# Patient Record
Sex: Male | Born: 1940
Health system: Southern US, Community
[De-identification: ages and names within clinical notes are randomized; demographics above are authoritative.]

## PROBLEM LIST (undated history)

## (undated) DIAGNOSIS — C240 Malignant neoplasm of extrahepatic bile duct: Secondary | ICD-10-CM

## (undated) DIAGNOSIS — I10 Essential (primary) hypertension: Secondary | ICD-10-CM

## (undated) DIAGNOSIS — M1990 Unspecified osteoarthritis, unspecified site: Secondary | ICD-10-CM

## (undated) DIAGNOSIS — I509 Heart failure, unspecified: Secondary | ICD-10-CM

## (undated) DIAGNOSIS — F419 Anxiety disorder, unspecified: Secondary | ICD-10-CM

## (undated) HISTORY — PX: MOHS SURGERY: SUR867

## (undated) HISTORY — PX: BILE DUCT EXPLORATION: SHX1225

## (undated) HISTORY — DX: Malignant neoplasm of extrahepatic bile duct: C24.0

## (undated) MED FILL — Dexamethasone Sodium Phosphate Inj 100 MG/10ML: INTRAMUSCULAR | Qty: 1 | Status: AC

## (undated) MED FILL — Sodium Chloride IV Soln 0.9%: INTRAVENOUS | Qty: 250 | Status: AC

## (undated) MED FILL — Fosaprepitant Dimeglumine For IV Infusion 150 MG (Base Eq): INTRAVENOUS | Qty: 5 | Status: AC

---

## 2017-12-11 DIAGNOSIS — K769 Liver disease, unspecified: Secondary | ICD-10-CM

## 2017-12-17 DIAGNOSIS — C229 Malignant neoplasm of liver, not specified as primary or secondary: Secondary | ICD-10-CM

## 2017-12-27 DIAGNOSIS — D490 Neoplasm of unspecified behavior of digestive system: Secondary | ICD-10-CM

## 2017-12-27 HISTORY — DX: Neoplasm of unspecified behavior of digestive system: D49.0

## 2017-12-29 DIAGNOSIS — C221 Intrahepatic bile duct carcinoma: Secondary | ICD-10-CM | POA: Insufficient documentation

## 2017-12-29 HISTORY — DX: Intrahepatic bile duct carcinoma: C22.1

## 2018-01-08 DIAGNOSIS — N390 Urinary tract infection, site not specified: Secondary | ICD-10-CM

## 2018-01-08 HISTORY — DX: Urinary tract infection, site not specified: N39.0

## 2018-02-14 DIAGNOSIS — C229 Malignant neoplasm of liver, not specified as primary or secondary: Secondary | ICD-10-CM

## 2018-02-14 DIAGNOSIS — R978 Other abnormal tumor markers: Secondary | ICD-10-CM

## 2018-02-19 DIAGNOSIS — Z79899 Other long term (current) drug therapy: Secondary | ICD-10-CM

## 2018-02-19 DIAGNOSIS — C229 Malignant neoplasm of liver, not specified as primary or secondary: Secondary | ICD-10-CM

## 2018-03-04 DIAGNOSIS — Z79899 Other long term (current) drug therapy: Secondary | ICD-10-CM

## 2018-03-04 DIAGNOSIS — C229 Malignant neoplasm of liver, not specified as primary or secondary: Secondary | ICD-10-CM

## 2018-03-04 DIAGNOSIS — R110 Nausea: Secondary | ICD-10-CM

## 2018-03-04 DIAGNOSIS — R197 Diarrhea, unspecified: Secondary | ICD-10-CM

## 2018-03-13 DIAGNOSIS — Z79899 Other long term (current) drug therapy: Secondary | ICD-10-CM

## 2018-03-13 DIAGNOSIS — C229 Malignant neoplasm of liver, not specified as primary or secondary: Secondary | ICD-10-CM

## 2018-04-17 DIAGNOSIS — C229 Malignant neoplasm of liver, not specified as primary or secondary: Secondary | ICD-10-CM

## 2018-04-17 DIAGNOSIS — R531 Weakness: Secondary | ICD-10-CM

## 2018-04-17 DIAGNOSIS — Z79899 Other long term (current) drug therapy: Secondary | ICD-10-CM

## 2018-05-08 DIAGNOSIS — C229 Malignant neoplasm of liver, not specified as primary or secondary: Secondary | ICD-10-CM

## 2018-05-29 DIAGNOSIS — C229 Malignant neoplasm of liver, not specified as primary or secondary: Secondary | ICD-10-CM

## 2018-06-24 DIAGNOSIS — C229 Malignant neoplasm of liver, not specified as primary or secondary: Secondary | ICD-10-CM

## 2018-06-24 DIAGNOSIS — Z79899 Other long term (current) drug therapy: Secondary | ICD-10-CM

## 2018-07-31 DIAGNOSIS — R238 Other skin changes: Secondary | ICD-10-CM

## 2018-07-31 DIAGNOSIS — Z79899 Other long term (current) drug therapy: Secondary | ICD-10-CM

## 2018-07-31 DIAGNOSIS — C229 Malignant neoplasm of liver, not specified as primary or secondary: Secondary | ICD-10-CM

## 2018-10-13 DIAGNOSIS — C229 Malignant neoplasm of liver, not specified as primary or secondary: Secondary | ICD-10-CM

## 2019-04-16 DIAGNOSIS — C787 Secondary malignant neoplasm of liver and intrahepatic bile duct: Secondary | ICD-10-CM

## 2019-04-16 DIAGNOSIS — C221 Intrahepatic bile duct carcinoma: Secondary | ICD-10-CM

## 2019-04-24 ENCOUNTER — Encounter: Payer: Self-pay | Admitting: Cardiology

## 2019-04-24 ENCOUNTER — Ambulatory Visit (INDEPENDENT_AMBULATORY_CARE_PROVIDER_SITE_OTHER): Payer: Medicare Other | Admitting: Cardiology

## 2019-04-24 ENCOUNTER — Other Ambulatory Visit: Payer: Self-pay

## 2019-04-24 DIAGNOSIS — R0600 Dyspnea, unspecified: Secondary | ICD-10-CM | POA: Insufficient documentation

## 2019-04-24 DIAGNOSIS — I420 Dilated cardiomyopathy: Secondary | ICD-10-CM

## 2019-04-24 DIAGNOSIS — R0609 Other forms of dyspnea: Secondary | ICD-10-CM

## 2019-04-24 DIAGNOSIS — I5030 Unspecified diastolic (congestive) heart failure: Secondary | ICD-10-CM | POA: Insufficient documentation

## 2019-04-24 DIAGNOSIS — I10 Essential (primary) hypertension: Secondary | ICD-10-CM

## 2019-04-24 DIAGNOSIS — I5032 Chronic diastolic (congestive) heart failure: Secondary | ICD-10-CM

## 2019-04-24 DIAGNOSIS — I429 Cardiomyopathy, unspecified: Secondary | ICD-10-CM | POA: Insufficient documentation

## 2019-04-24 DIAGNOSIS — M1990 Unspecified osteoarthritis, unspecified site: Secondary | ICD-10-CM | POA: Insufficient documentation

## 2019-04-24 HISTORY — DX: Unspecified diastolic (congestive) heart failure: I50.30

## 2019-04-24 HISTORY — DX: Essential (primary) hypertension: I10

## 2019-04-24 HISTORY — DX: Other forms of dyspnea: R06.09

## 2019-04-24 HISTORY — DX: Cardiomyopathy, unspecified: I42.9

## 2019-04-24 HISTORY — DX: Dyspnea, unspecified: R06.00

## 2019-04-24 NOTE — Addendum Note (Signed)
Addended by: Ashok Norris on: 04/24/2019 10:43 AM   Modules accepted: Orders

## 2019-04-24 NOTE — Addendum Note (Signed)
Addended by: Jerl Santos R on: 04/24/2019 05:13 PM   Modules accepted: Orders

## 2019-04-24 NOTE — Progress Notes (Signed)
Cardiology Consultation:    Date:  04/24/2019   ID:  Gary [name], DOB 01-07-1941, MRN [id number]  PCP:  Gary Shelling, MD  Cardiologist:  Gary Campus, MD   Referring MD: Gary Shelling, MD   Chief Complaint  Patient presents with  . Shortness of Breath    History of Present Illness:    Gary [name] is a 78 y.o. male who is being seen today for the evaluation of shortness of breath at the request of Gary Shelling, MD.  Apparently he was seen in our practice years ago.  He was told to have cardiomyopathy.  Last estimation of his left ventricular ejection fraction was done in April of last year which showed ejection fraction 45 to 50% however transmitral flow pattern was pseudo-normal and there was diastolic dysfunction.  He came back to [place] complaining of shortness of breath he complained of being weak tired fatigue as well as shortness of breath.  Describe also the fact that he have difficulty breathing at night sometimes he need to sit up to catch his breath.  Denies having any palpitations.  He cannot tell me if he have some swelling of lower extremities.  Last week by himself he started taking some furosemide and he started feeling better he stopped taking this medication and he does not take Lasix right now.  At the moment of my interview he seems to be comfortable.  He sits in the room with his wife.  He said he got difficult time memorizing stuff that is why she is with [place].  He denies having any chest pain tightness squeezing pressure been chest but describes symptoms to have choking sensation in his throat he said he got difficulty swallowing when it happens.  It does not happen with exercise.  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  He also got carotic ultrasound done recently and was told everything is fine I do not have results of that.  No past medical history on file.     Current Medications: Current Meds  Medication Sig  . ALPRAZolam (XANAX) 0.5 MG tablet Take 0.5 mg by mouth 3 (three) times daily as needed.  Marland Kitchen buPROPion (WELLBUTRIN XL) 150 MG 24 hr tablet Take 150 mg by mouth 2 (two) times daily.  . carvedilol (COREG) 3.125 MG tablet Take 3.125 mg by mouth 2 (two) times daily.  . clotrimazole-betamethasone (LOTRISONE) cream APPLY 1 APPLICATION TO AFFECTED AREA AS DIRECTED  . hydrochlorothiazide (HYDRODIURIL) 25 MG tablet Take 25 mg by mouth every morning.  Marland Kitchen HYDROcodone-acetaminophen (NORCO) 10-325 MG tablet Take 1 tablet by mouth 3 (three) times daily as needed.  . loratadine (CLARITIN REDITABS) 10 MG dissolvable tablet Take 10 mg by mouth daily.  . Multiple Vitamin (MULTIVITAMIN) tablet Take 1 tablet by mouth daily.  . Omega-3 Fatty Acids (FISH OIL) 1000 MG CAPS Take by mouth.  Marland Kitchen omeprazole (PRILOSEC) 40 MG capsule Take 1 capsule by mouth daily.  . ondansetron (ZOFRAN) 4 MG tablet Take 1 tablet by mouth 2 (two) times daily as needed.  . tamsulosin (FLOMAX) 0.4 MG CAPS capsule TAKE 1 (ONE) CAPSULE DAILY, 1/2 HR AFTER SUPPER  . vitamin B-12 (CYANOCOBALAMIN) 250 MCG tablet Take 250 mcg by mouth daily.     Allergies:   Pork-derived products and Amoxicillin   Social History   Socioeconomic History  . Marital status: Widowed    Spouse name:  Not on file  . Number of children: Not on file  . Years of education: Not on file  . Highest education level: Not on file  Occupational History  . Not on file  Social Needs  . Financial resource strain: Not on file  . Food insecurity    Worry: Not on file    Inability: Not on file  . Transportation needs    Medical: Not on file    Non-medical: Not on file  Tobacco Use  . Smoking status: Never Smoker  . Smokeless tobacco: Current User    Types: Chew  Substance and Sexual Activity  . Alcohol use: Not Currently  . Drug use: Not on file  . Sexual activity: Not on file  Lifestyle  . Physical activity     Days per week: Not on file    Minutes per session: Not on file  . Stress: Not on file  Relationships  . Social Herbalist on phone: Not on file    Gets together: Not on file    Attends religious service: Not on file    Active member of club or organization: Not on file    Attends meetings of clubs or organizations: Not on file    Relationship status: Not on file  Other Topics Concern  . Not on file  Social History Narrative  . Not on file     Family History: The patient's family history is not on file. ROS:   Please see the history of present illness.    All 14 point review of systems negative except as described per history of present illness.  EKGs/Labs/Other Studies Reviewed:    The following studies were reviewed today:   EKG:  EKG is  ordered today.  The ekg ordered today demonstrates normal sinus rhythm nonspecific ST-T segment changes normal QS complex duration morphology.  Recent Labs: No results found for requested labs within last 8760 hours.  Recent Lipid Panel No results found for: CHOL, TRIG, HDL, CHOLHDL, VLDL, LDLCALC, LDLDIRECT  Physical Exam:    VS:  BP 116/68   Pulse 61   Ht 6\' 2"  (1.88 m)   Wt 292 lb 3.2 oz (132.5 kg)   SpO2 97%   BMI 37.52 kg/m     Wt Readings from Last 3 Encounters:  04/24/19 292 lb 3.2 oz (132.5 kg)     GEN:  Well nourished, well developed in no acute distress HEENT: Normal NECK: No JVD; No carotid bruits LYMPHATICS: No lymphadenopathy CARDIAC: RRR, no murmurs, no rubs, no gallops RESPIRATORY:  Clear to auscultation without rales, wheezing or rhonchi  ABDOMEN: Soft, non-tender, non-distended MUSCULOSKELETAL:  No edema; No deformity  SKIN: Warm and dry NEUROLOGIC:  Alert and oriented x 3 PSYCHIATRIC:  Normal affect   ASSESSMENT:    1. 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 Dyspnea on exertion   3. Chronic diastolic congestive heart failure ([place])   4. Essential  hypertension    PLAN:    In order of problems listed above:  1. History of dilated cardiomyopathy with last estimation of ejection fraction last year 45 to 50%.  We will repeat his echocardiogram.  I will ask him to have Chem-7 done and proBNP.  It will be especially important since he took furosemide about himself without on his potassium supplementation.  I asked him not to modify his medication right now he is stop already furosemide obviously will when we  get results of his test back will advise him about medications I anticipate need to put him on some standing dose of furosemide. 2. Dyspnea on exertion probably multifactorial but with this gentleman history of cardiomyopathy we need to assess his left ventricular ejection fraction which we will do with echocardiogram. 3. Chronic diastolic congestive heart failure plan as outlined above 4. Essential hypertension blood pressure appears to be well controlled.   Medication Adjustments/Labs and Tests Ordered: Current medicines are reviewed at length with the patient today.  Concerns regarding medicines are outlined above.  No orders of the defined types were placed in this encounter.  No orders of the defined types were placed in this encounter.   Signed, Park Liter, MD, [facility]. 04/24/2019 10:34 AM    [place]

## 2019-04-24 NOTE — Patient Instructions (Signed)
Medication Instructions:  Your physician recommends that you continue on your current medications as directed. Please refer to the Current Medication list given to you today.  If you need a refill on your cardiac medications before your next appointment, please call your pharmacy.   Lab work: Your physician recommends that you return for lab work in: bmp and pro bnp   If you have labs (blood work) drawn today and your tests are completely normal, you will receive your results only by: Marland Kitchen MyChart Message (if you have MyChart) OR . A paper copy in the mail If you have any lab test that is abnormal or we need to change your treatment, we will call you to review the results.  Testing/Procedures: Your physician has requested that you have an echocardiogram. Echocardiography is a painless test that uses sound waves to create images of your heart. It provides your doctor with information about the size and shape of your heart and how well your heart's chambers and valves are working. This procedure takes approximately one hour. There are no restrictions for this procedure.    Follow-Up: At [facility], you and your health needs are our priority.  As part of our continuing mission to provide you with exceptional heart care, we have created designated Provider Care Teams.  These Care Teams include your primary Cardiologist (physician) and Advanced Practice Providers (APPs -  Physician Assistants and Nurse Practitioners) who all work together to provide you with the care you need, when you need it. You will need a follow up appointment in 4 weeks.  Please call our office 2 months in advance to schedule this appointment.  You may see No primary care provider on file. or another member of our [employer] Provider Team in [place]: Shirlee More, MD . Jyl Heinz, MD  Any Other Special Instructions Will Be Listed Below (If Applicable).   Echocardiogram An echocardiogram is a procedure that uses  painless sound waves (ultrasound) to produce an image of the heart. Images from an echocardiogram can provide important information about:  Signs of coronary artery disease (CAD).  Aneurysm detection. An aneurysm is a weak or damaged part of an artery wall that bulges out from the normal force of blood pumping through the body.  Heart size and shape. Changes in the size or shape of the heart can be associated with certain conditions, including heart failure, aneurysm, and CAD.  Heart muscle function.  Heart valve function.  Signs of a past heart attack.  Fluid buildup around the heart.  Thickening of the heart muscle.  A tumor or infectious growth around the heart valves. Tell a health care provider about:  Any allergies you have.  All medicines you are taking, including vitamins, herbs, eye drops, creams, and over-the-counter medicines.  Any blood disorders you have.  Any surgeries you have had.  Any medical conditions you have.  Whether you are pregnant or may be pregnant. What are the risks? Generally, this is a safe procedure. However, problems may occur, including:  Allergic reaction to dye (contrast) that may be used during the procedure. What happens before the procedure? No specific preparation is needed. You may eat and drink normally. What happens during the procedure?   An IV tube may be inserted into one of your veins.  You may receive contrast through this tube. A contrast is an injection that improves the quality of the pictures from your heart.  A gel will be applied to your chest.  A  wand-like tool (transducer) will be moved over your chest. The gel will help to transmit the sound waves from the transducer.  The sound waves will harmlessly bounce off of your heart to allow the heart images to be captured in real-time motion. The images will be recorded on a computer. The procedure may vary among health care providers and hospitals. What happens after  the procedure?  You may return to your normal, everyday life, including diet, activities, and medicines, unless your health care provider tells you not to do that. Summary  An echocardiogram is a procedure that uses painless sound waves (ultrasound) to produce an image of the heart.  Images from an echocardiogram can provide important information about the size and shape of your heart, heart muscle function, heart valve function, and fluid buildup around your heart.  You do not need to do anything to prepare before this procedure. You may eat and drink normally.  After the echocardiogram is completed, you may return to your normal, everyday life, unless your health care provider tells you not to do that. This information is not intended to replace advice given to you by your health care provider. Make sure you discuss any questions you have with your health care provider. Document Released: 09/07/2000 Document Revised: 01/01/2019 Document Reviewed: 10/13/2016 Elsevier Patient Education  2020 [employer].

## 2019-04-25 LAB — BASIC METABOLIC PANEL
BUN/Creatinine Ratio: 20 (ref 10–24)
BUN: 23 mg/dL (ref 8–27)
CO2: 24 mmol/L (ref 20–29)
Calcium: 9.2 mg/dL (ref 8.6–10.2)
Chloride: 100 mmol/L (ref 96–106)
Creatinine, Ser: 1.15 mg/dL (ref 0.76–1.27)
GFR calc Af Amer: 71 mL/min/{1.73_m2} (ref >59)
GFR calc non Af Amer: 61 mL/min/{1.73_m2} (ref >59)
Glucose: 152 mg/dL — ABNORMAL HIGH (ref 65–99)
Potassium: 4 mmol/L (ref 3.5–5.2)
Sodium: 140 mmol/L (ref 134–144)

## 2019-04-25 LAB — PRO B NATRIURETIC PEPTIDE: NT-Pro BNP: 165 pg/mL (ref 0–486)

## 2019-05-04 ENCOUNTER — Ambulatory Visit: Payer: Medicare Other | Admitting: Cardiology

## 2019-05-06 ENCOUNTER — Other Ambulatory Visit: Payer: Medicare Other

## 2019-05-27 ENCOUNTER — Ambulatory Visit (INDEPENDENT_AMBULATORY_CARE_PROVIDER_SITE_OTHER): Payer: Medicare Other

## 2019-05-27 ENCOUNTER — Other Ambulatory Visit: Payer: Self-pay

## 2019-05-27 DIAGNOSIS — I420 Dilated cardiomyopathy: Secondary | ICD-10-CM | POA: Diagnosis not present

## 2019-05-27 DIAGNOSIS — R0609 Other forms of dyspnea: Secondary | ICD-10-CM | POA: Diagnosis not present

## 2019-05-27 NOTE — Progress Notes (Signed)
Complete echocardiogram has been performed.  Jimmy Dharma Pare RDCS, RVT 

## 2019-06-02 ENCOUNTER — Other Ambulatory Visit: Payer: Self-pay

## 2019-06-02 ENCOUNTER — Encounter: Payer: Self-pay | Admitting: Cardiology

## 2019-06-02 ENCOUNTER — Ambulatory Visit (INDEPENDENT_AMBULATORY_CARE_PROVIDER_SITE_OTHER): Payer: Medicare Other | Admitting: Cardiology

## 2019-06-02 VITALS — BP 126/72 | HR 63 | Wt 298.0 lb

## 2019-06-02 DIAGNOSIS — I420 Dilated cardiomyopathy: Secondary | ICD-10-CM | POA: Diagnosis not present

## 2019-06-02 DIAGNOSIS — I5032 Chronic diastolic (congestive) heart failure: Secondary | ICD-10-CM

## 2019-06-02 DIAGNOSIS — I10 Essential (primary) hypertension: Secondary | ICD-10-CM | POA: Diagnosis not present

## 2019-06-02 DIAGNOSIS — R0609 Other forms of dyspnea: Secondary | ICD-10-CM | POA: Diagnosis not present

## 2019-06-02 DIAGNOSIS — R0600 Dyspnea, unspecified: Secondary | ICD-10-CM

## 2019-06-02 NOTE — Patient Instructions (Signed)
Medication Instructions:  Your physician recommends that you continue on your current medications as directed. Please refer to the Current Medication list given to you today.  If you need a refill on your cardiac medications before your next appointment, please call your pharmacy.   Lab work: None.  If you have labs (blood work) drawn today and your tests are completely normal, you will receive your results only by: . MyChart Message (if you have MyChart) OR . A paper copy in the mail If you have any lab test that is abnormal or we need to change your treatment, we will call you to review the results.  Testing/Procedures: Your physician has requested that you have a lexiscan myoview. For further information please visit [email or web address]. Please follow instruction sheet, as given.    Follow-Up: At [facility], you and your health needs are our priority.  As part of our continuing mission to provide you with exceptional heart care, we have created designated Provider Care Teams.  These Care Teams include your primary Cardiologist (physician) and Advanced Practice Providers (APPs -  Physician Assistants and Nurse Practitioners) who all work together to provide you with the care you need, when you need it. You will need a follow up appointment in 3 months.  Please call our office 2 months in advance to schedule this appointment.  You may see No primary care provider on file. or another member of our [facility] Provider Team in [place]: Brian Munley, MD . Rajan Revankar, MD  Any Other Special Instructions Will Be Listed Below (If Applicable).   Cardiac Nuclear Scan A cardiac nuclear scan is a test that measures blood flow to the heart when a person is resting and when he or she is exercising. The test looks for problems such as:  Not enough blood reaching a portion of the heart.  The heart muscle not working normally. You may need this test if:  You have heart disease.  You  have had abnormal lab results.  You have had heart surgery or a balloon procedure to open up blocked arteries (angioplasty).  You have chest pain.  You have shortness of breath. In this test, a radioactive dye (tracer) is injected into your bloodstream. After the tracer has traveled to your heart, an imaging device is used to measure how much of the tracer is absorbed by or distributed to various areas of your heart. This procedure is usually done at a hospital and takes 2-4 hours. Tell a health care provider about:  Any allergies you have.  All medicines you are taking, including vitamins, herbs, eye drops, creams, and over-the-counter medicines.  Any problems you or family members have had with anesthetic medicines.  Any blood disorders you have.  Any surgeries you have had.  Any medical conditions you have.  Whether you are pregnant or may be pregnant. What are the risks? Generally, this is a safe procedure. However, problems may occur, including:  Serious chest pain and heart attack. This is only a risk if the stress portion of the test is done.  Rapid heartbeat.  Sensation of warmth in your chest. This usually passes quickly.  Allergic reaction to the tracer. What happens before the procedure?  Ask your health care provider about changing or stopping your regular medicines. This is especially important if you are taking diabetes medicines or blood thinners.  Follow instructions from your health care provider about eating or drinking restrictions.  Remove your jewelry on the day of the procedure.   What happens during the procedure?  An IV will be inserted into one of your veins.  Your health care provider will inject a small amount of radioactive tracer through the IV.  You will wait for 20-40 minutes while the tracer travels through your bloodstream.  Your heart activity will be monitored with an electrocardiogram (ECG).  You will lie down on an exam table.   Images of your heart will be taken for about 15-20 minutes.  You may also have a stress test. For this test, one of the following may be done: ? You will exercise on a treadmill or stationary bike. While you exercise, your heart's activity will be monitored with an ECG, and your blood pressure will be checked. ? You will be given medicines that will increase blood flow to parts of your heart. This is done if you are unable to exercise.  When blood flow to your heart has peaked, a tracer will again be injected through the IV.  After 20-40 minutes, you will get back on the exam table and have more images taken of your heart.  Depending on the type of tracer used, scans may need to be repeated 3-4 hours later.  Your IV line will be removed when the procedure is over. The procedure may vary among health care providers and hospitals. What happens after the procedure?  Unless your health care provider tells you otherwise, you may return to your normal schedule, including diet, activities, and medicines.  Unless your health care provider tells you otherwise, you may increase your fluid intake. This will help to flush the contrast dye from your body. Drink enough fluid to keep your urine pale yellow.  Ask your health care provider, or the department that is doing the test: ? When will my results be ready? ? How will I get my results? Summary  A cardiac nuclear scan measures the blood flow to the heart when a person is resting and when he or she is exercising.  Tell your health care provider if you are pregnant.  Before the procedure, ask your health care provider about changing or stopping your regular medicines. This is especially important if you are taking diabetes medicines or blood thinners.  After the procedure, unless your health care provider tells you otherwise, increase your fluid intake. This will help flush the contrast dye from your body.  After the procedure, unless your health  care provider tells you otherwise, you may return to your normal schedule, including diet, activities, and medicines. This information is not intended to replace advice given to you by your health care provider. Make sure you discuss any questions you have with your health care provider. Document Released: 10/05/2004 Document Revised: 02/24/2018 Document Reviewed: 02/24/2018 Elsevier Patient Education  2020 Elsevier Inc.    

## 2019-06-02 NOTE — Progress Notes (Signed)
Cardiology Office Note:    Date:  06/02/2019   ID:  Gary [name], DOB Oct 16, 1940, MRN [id number]  PCP:  Bonnita Nasuti, MD  Cardiologist:  Jenne Campus, MD    Referring MD: Bonnita Nasuti, MD   No chief complaint on file.   History of Present Illness:    Gary [name] is a 78 y.o. male who was referred to [place] because of shortness of breath.  He does have history of cardiomyopathy however latest echocardiogram done showed ejection fraction 50 to 55%.  He does have some diastolic dysfunction.  Today he comes to my office for follow-up he still complain of not feeling well on my questioning exactly what is the problem he tells me he is tired exhausted and short of breath.  When asked when was the last time that he felt good he cannot recall.  He does not do much he try to walk a little bit but to get tired very easily.  Denies having any chest pain tightness squeezing pressure burning chest while walking.  No past medical history on file.    Current Medications: No outpatient medications have been marked as taking for the 06/02/19 encounter (Office Visit) with Park Liter, MD.     Allergies:   Pork-derived products and Amoxicillin   Social History   Socioeconomic History  . Marital status: Widowed    Spouse name: Not on file  . Number of children: Not on file  . Years of education: Not on file  . Highest education level: Not on file  Occupational History  . Not on file  Social Needs  . Financial resource strain: Not on file  . Food insecurity    Worry: Not on file    Inability: Not on file  . Transportation needs    Medical: Not on file    Non-medical: Not on file  Tobacco Use  . Smoking status: Never Smoker  . Smokeless tobacco: Current User    Types: Chew  Substance and Sexual Activity  . Alcohol use: Not Currently  . Drug use: Not on file  . Sexual activity: Not on file  Lifestyle  . Physical activity    Days per week: Not on file    Minutes per  session: Not on file  . Stress: Not on file  Relationships  . Social Herbalist on phone: Not on file    Gets together: Not on file    Attends religious service: Not on file    Active member of club or organization: Not on file    Attends meetings of clubs or organizations: Not on file    Relationship status: Not on file  Other Topics Concern  . Not on file  Social History Narrative  . Not on file     Family History: The patient's family history is not on file. ROS:   Please see the history of present illness.    All 14 point review of systems negative except as described per history of present illness  EKGs/Labs/Other Studies Reviewed:      Recent Labs: 04/24/2019: BUN 23; Creatinine, Ser 1.15; NT-Pro BNP 165; Potassium 4.0; Sodium 140  Recent Lipid Panel No results found for: CHOL, TRIG, HDL, CHOLHDL, VLDL, LDLCALC, LDLDIRECT  Physical Exam:    VS:  BP 126/72   Pulse 63   Wt 298 lb (135.2 kg)   SpO2 94%   BMI 38.26 kg/m     Wt Readings from  Last 3 Encounters:  06/02/19 298 lb (135.2 kg)  04/24/19 292 lb 3.2 oz (132.5 kg)     GEN:  Well nourished, well developed in no acute distress HEENT: Normal NECK: No JVD; No carotid bruits LYMPHATICS: No lymphadenopathy CARDIAC: RRR, no murmurs, no rubs, no gallops RESPIRATORY:  Clear to auscultation without rales, wheezing or rhonchi  ABDOMEN: Soft, non-tender, non-distended MUSCULOSKELETAL:  No edema; No deformity  SKIN: Warm and dry LOWER EXTREMITIES: no swelling NEUROLOGIC:  Alert and oriented x 3 PSYCHIATRIC:  Normal affect   ASSESSMENT:    1. 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 Chronic diastolic congestive heart failure ([place])   3. Dyspnea on exertion   4. Essential hypertension    PLAN:    In order of problems listed above:  1. Dilated cardiomyopathy last echocardiogram done this year showed ejection fraction 50 to 55%.  We will continue present  management.  He is proBNP was normal on the physical exam I do not see any evidence of decompensation.  We will continue present management. 2. Dyspnea on exertion multifactorial he is never smoked.  I will schedule him to have a stress test to make sure there is no inducible ischemia thinking that he may have angina equivalent.  If that will not be the case he may require pulmonary function test. 3. Essential hypertension blood pressure well controlled continue present management.   Medication Adjustments/Labs and Tests Ordered: Current medicines are reviewed at length with the patient today.  Concerns regarding medicines are outlined above.  No orders of the defined types were placed in this encounter.  Medication changes: No orders of the defined types were placed in this encounter.   Signed, Park Liter, MD, [facility] 06/02/2019 2:25 PM    [place]

## 2019-07-08 ENCOUNTER — Telehealth (HOSPITAL_COMMUNITY): Payer: Self-pay | Admitting: *Deleted

## 2019-07-08 NOTE — Telephone Encounter (Signed)
Left message on voicemail in reference to upcoming appointment scheduled for 07/14/19. Phone number given for a call back so details instructions can be given. Gary [name]

## 2019-07-14 ENCOUNTER — Other Ambulatory Visit: Payer: Self-pay

## 2019-07-14 ENCOUNTER — Ambulatory Visit (INDEPENDENT_AMBULATORY_CARE_PROVIDER_SITE_OTHER): Payer: Medicare Other

## 2019-07-14 DIAGNOSIS — R0600 Dyspnea, unspecified: Secondary | ICD-10-CM

## 2019-07-14 MED ORDER — TECHNETIUM TC 99M TETROFOSMIN IV KIT
30.1000 | PACK | Freq: Once | INTRAVENOUS | Status: AC | PRN
  Administered 2019-07-14: 30.1 via INTRAVENOUS

## 2019-07-14 MED ORDER — REGADENOSON 0.4 MG/5ML IV SOLN
0.4000 mg | Freq: Once | INTRAVENOUS | Status: AC
  Administered 2019-07-14: 0.4 mg via INTRAVENOUS

## 2019-07-15 ENCOUNTER — Ambulatory Visit: Payer: Medicare Other

## 2019-07-15 LAB — MYOCARDIAL PERFUSION IMAGING
LV dias vol: 154 mL (ref 62–150)
LV sys vol: 76 mL
Peak HR: 88 {beats}/min
Rest HR: 68 {beats}/min
SDS: 1
SRS: 6
SSS: 7
TID: 0.95

## 2019-07-15 MED ORDER — TECHNETIUM TC 99M TETROFOSMIN IV KIT
33.0000 | PACK | Freq: Once | INTRAVENOUS | Status: AC | PRN
  Administered 2019-07-15: 33 via INTRAVENOUS

## 2019-07-17 ENCOUNTER — Telehealth: Payer: Self-pay | Admitting: Emergency Medicine

## 2019-07-17 NOTE — Telephone Encounter (Signed)
Left message for patient to return call for test results.

## 2019-09-09 ENCOUNTER — Other Ambulatory Visit: Payer: Self-pay

## 2019-09-09 ENCOUNTER — Ambulatory Visit (INDEPENDENT_AMBULATORY_CARE_PROVIDER_SITE_OTHER): Payer: Medicare Other | Admitting: Cardiology

## 2019-09-09 ENCOUNTER — Encounter: Payer: Self-pay | Admitting: Cardiology

## 2019-09-09 VITALS — BP 158/86 | HR 53 | Ht 74.0 in | Wt 297.0 lb

## 2019-09-09 DIAGNOSIS — I5032 Chronic diastolic (congestive) heart failure: Secondary | ICD-10-CM

## 2019-09-09 DIAGNOSIS — I10 Essential (primary) hypertension: Secondary | ICD-10-CM | POA: Diagnosis not present

## 2019-09-09 DIAGNOSIS — R0609 Other forms of dyspnea: Secondary | ICD-10-CM

## 2019-09-09 DIAGNOSIS — R0600 Dyspnea, unspecified: Secondary | ICD-10-CM

## 2019-09-09 DIAGNOSIS — I420 Dilated cardiomyopathy: Secondary | ICD-10-CM | POA: Diagnosis not present

## 2019-09-09 NOTE — Patient Instructions (Signed)
Medication Instructions:  Your physician recommends that you continue on your current medications as directed. Please refer to the Current Medication list given to you today.  *If you need a refill on your cardiac medications before your next appointment, please call your pharmacy*  Lab Work: Your physician recommends that you return for lab work today: bmp   If you have labs (blood work) drawn today and your tests are completely normal, you will receive your results only by: Marland Kitchen MyChart Message (if you have MyChart) OR . A paper copy in the mail If you have any lab test that is abnormal or we need to change your treatment, we will call you to review the results.  Testing/Procedures: Your physician has recommended that you have a pulmonary function test. Pulmonary Function Tests are a group of tests that measure how well air moves in and out of your lungs.    Follow-Up: At [facility], you and your health needs are our priority.  As part of our continuing mission to provide you with exceptional heart care, we have created designated Provider Care Teams.  These Care Teams include your primary Cardiologist (physician) and Advanced Practice Providers (APPs -  Physician Assistants and Nurse Practitioners) who all work together to provide you with the care you need, when you need it.  Your next appointment:   4 month(s)  The format for your next appointment:   In Person  Provider:   Jenne Campus, MD  Other Instructions   Pulmonary Function Tests Pulmonary function tests (PFTs) are used to measure how well your lungs work, find out what is causing your lung problems, and figure out the best treatment for you. You may have PFTs:  When you have an illness involving the lungs.  To follow changes in your lung function over time if you have a chronic lung disease.  If you are an Nature conservation officer. This checks the effects of being exposed to chemicals over a long period of  time.  To check lung function before having surgery or other procedures.  To check your lungs if you smoke.  To check if prescribed medicines or treatments are helping your lungs. Your results will be compared to the expected lung function of someone with healthy lungs who is similar to you in:  Age.  Gender.  Height.  Weight.  Race or ethnicity. This is done to show how your lungs compare to normal lung function (percent predicted). This is how your health care provider knows if your lung function is normal or not. If you have had PFTs done before, your health care provider will compare your current results with past results. This shows if your lung function is better, worse, or the same as before. Tell a health care provider about:  Any allergies you have.  All medicines you are taking, including inhaler or nebulizer medicines, vitamins, herbs, eye drops, creams, and over-the-counter medicines.  Any blood disorders you have.  Any surgeries you have had, especially recent eye surgery, abdominal surgery, or chest surgery. These can make PFTs difficult or unsafe.  Any medical conditions you have, including chest pain or heart problems, tuberculosis, or respiratory infections such as pneumonia, a cold, or the flu.  Any fear of being in closed spaces (claustrophobia). Some of your tests may be in a closed space. What are the risks? Generally, this is a safe procedure. However, problems may occur, including:  Light-headedness due to over-breathing (hyperventilation).  An asthma attack from deep breathing.  A collapsed lung. What happens before the procedure?  Take over-the-counter and prescription medicines only as told by your health care provider. If you take inhaler or nebulizer medicines, ask your health care provider which medicines you should take on the day of your testing. Some inhaler medicines may interfere with PFTs if they are taken shortly before the  tests.  Follow your health care provider's instructions on eating and drinking restrictions. This may include avoiding eating large meals and drinking alcohol before the testing.  Do not use any products that contain nicotine or tobacco, such as cigarettes and e-cigarettes. If you need help quitting, ask your health care provider.  Wear comfortable clothing that will not interfere with breathing. What happens during the procedure?   You will be given a soft nose clip to wear. This is done so all of your breaths will go through your mouth instead of your nose.  You will be given a germ-free (sterile) mouthpiece. It will be attached to a machine that measures your breathing (spirometer).  You will be asked to do various breathing maneuvers. The maneuvers will be done by breathing in (inhaling) and breathing out (exhaling). You may be asked to repeat the maneuvers several times before the testing is done.  It is important to follow the instructions exactly to get accurate results. Make sure to blow as hard and as fast as you can when you are told to do so.  You may be given a medicine that makes the small air passages in your lungs larger (bronchodilator) after testing has been done. This medicine will make it easier for you to breathe.  The tests will be repeated after the bronchodilator has taken effect.  You will be monitored carefully during the procedure for faintness, dizziness, trouble breathing, or any other problems. The procedure may vary among health care providers and hospitals. What happens after the procedure?  It is up to you to get your test results. Ask your health care provider, or the department that is doing the tests, when your results will be ready. After you have received your test results, talk with your health care provider about treatment options, if necessary. Summary  Pulmonary function tests (PFTs) are used to measure how well your lungs work, find out what is  causing your lung problems, and figure out the best treatment for you.  Wear comfortable clothing that will not interfere with breathing.  It is up to you to get your test results. After you have received them, talk with your health care provider about treatment options, if necessary. This information is not intended to replace advice given to you by your health care provider. Make sure you discuss any questions you have with your health care provider. Document Released: 05/03/2004 Document Revised: 09/07/2016 Document Reviewed: 08/02/2016 Elsevier Patient Education  2020 [employer].

## 2019-09-09 NOTE — Progress Notes (Signed)
Cardiology Office Note:    Date:  09/09/2019   ID:  Gary [name], DOB 1941-07-04, MRN [id number]  PCP:  Bonnita Nasuti, MD  Cardiologist:  Jenne Campus, MD    Referring MD: Bonnita Nasuti, MD   Chief Complaint  Patient presents with  . Follow-up  Doing better  History of Present Illness:    Gary [name] is a 78 y.o. male who was referred originally to [place] because of dyspnea on exertion quite extensive evaluation has been done.  He does have history of diminished ejection fraction however latest echocardiogram show preserved left ventricle ejection fraction with some diastolic dysfunction.  As a part of evaluation stress test was done to look for evidence of coronary artery disease and that was negative.  I think the reason for shortness of breath is diastolic dysfunction however, in my opinion his lung should be checked as well.  I think he need to have pulmonary function test done.  What I will do today I will check Chem-7 his Chem-7 is fine I will initiate losartan to help with the blood pressure which is still on the higher side.  No past medical history on file.    Current Medications: Current Meds  Medication Sig  . ALPRAZolam (XANAX) 0.5 MG tablet Take 0.5 mg by mouth 3 (three) times daily as needed.  Marland Kitchen buPROPion (WELLBUTRIN XL) 150 MG 24 hr tablet Take 150 mg by mouth 2 (two) times daily.  . carvedilol (COREG) 3.125 MG tablet Take 3.125 mg by mouth 2 (two) times daily.  . clotrimazole-betamethasone (LOTRISONE) cream APPLY 1 APPLICATION TO AFFECTED AREA AS DIRECTED  . hydrochlorothiazide (HYDRODIURIL) 25 MG tablet Take 25 mg by mouth every morning.  Marland Kitchen HYDROcodone-acetaminophen (NORCO) 10-325 MG tablet Take 1 tablet by mouth 3 (three) times daily as needed.  . Multiple Vitamin (MULTIVITAMIN) tablet Take 1 tablet by mouth daily.  Marland Kitchen omeprazole (PRILOSEC) 40 MG capsule Take 1 capsule by mouth daily.  . ondansetron (ZOFRAN) 4 MG tablet Take 1 tablet by mouth 2 (two) times  daily as needed.  . vitamin B-12 (CYANOCOBALAMIN) 250 MCG tablet Take 250 mcg by mouth daily.     Allergies:   Pork-derived products and Amoxicillin   Social History   Socioeconomic History  . Marital status: Widowed    Spouse name: Not on file  . Number of children: Not on file  . Years of education: Not on file  . Highest education level: Not on file  Occupational History  . Not on file  Tobacco Use  . Smoking status: Never Smoker  . Smokeless tobacco: Current User    Types: Chew  Substance and Sexual Activity  . Alcohol use: Not Currently  . Drug use: Not on file  . Sexual activity: Not on file  Other Topics Concern  . Not on file  Social History Narrative  . Not on file   Social Determinants of Health   Financial Resource Strain:   . Difficulty of Paying Living Expenses: Not on file  Food Insecurity:   . Worried About Charity fundraiser in the Last Year: Not on file  . Ran Out of Food in the Last Year: Not on file  Transportation Needs:   . Lack of Transportation (Medical): Not on file  . Lack of Transportation (Non-Medical): Not on file  Physical Activity:   . Days of Exercise per Week: Not on file  . Minutes of Exercise per Session: Not on file  Stress:   .  Feeling of Stress : Not on file  Social Connections:   . Frequency of Communication with Friends and Family: Not on file  . Frequency of Social Gatherings with Friends and Family: Not on file  . Attends Religious Services: Not on file  . Active Member of Clubs or Organizations: Not on file  . Attends Archivist Meetings: Not on file  . Marital Status: Not on file     Family History: The patient's family history is not on file. ROS:   Please see the history of present illness.    All 14 point review of systems negative except as described per history of present illness  EKGs/Labs/Other Studies Reviewed:      Recent Labs: 04/24/2019: BUN 23; Creatinine, Ser 1.15; NT-Pro BNP 165;  Potassium 4.0; Sodium 140  Recent Lipid Panel No results found for: CHOL, TRIG, HDL, CHOLHDL, VLDL, LDLCALC, LDLDIRECT  Physical Exam:    VS:  BP (!) 158/86   Pulse (!) 53   Ht 6\' 2"  (1.88 m)   Wt 297 lb (134.7 kg)   SpO2 98%   BMI 38.13 kg/m     Wt Readings from Last 3 Encounters:  09/09/19 297 lb (134.7 kg)  07/14/19 298 lb (135.2 kg)  06/02/19 298 lb (135.2 kg)     GEN:  Well nourished, well developed in no acute distress HEENT: Normal NECK: No JVD; No carotid bruits LYMPHATICS: No lymphadenopathy CARDIAC: RRR, no murmurs, no rubs, no gallops RESPIRATORY:  Clear to auscultation without rales, wheezing or rhonchi  ABDOMEN: Soft, non-tender, non-distended MUSCULOSKELETAL:  No edema; No deformity  SKIN: Warm and dry LOWER EXTREMITIES: no swelling NEUROLOGIC:  Alert and oriented x 3 PSYCHIATRIC:  Normal affect   ASSESSMENT:    1. 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 Essential hypertension   3. Chronic diastolic congestive heart failure ([place])   4. Dyspnea on exertion    PLAN:    In order of problems listed above:  1. Dilated cardiomyopathy.  His ejection fraction normalized.  He does have some diastolic dysfunction.  Plan is as outlined above. 2. Essential hypertension still elevated we will continue monitoring and treating appropriately. 3. Dyspnea and exertion will consider doing pulmonary function tests   Medication Adjustments/Labs and Tests Ordered: Current medicines are reviewed at length with the patient today.  Concerns regarding medicines are outlined above.  No orders of the defined types were placed in this encounter.  Medication changes: No orders of the defined types were placed in this encounter.   Signed, Park Liter, MD, [facility] 09/09/2019 2:44 PM    [place]

## 2019-09-10 LAB — BASIC METABOLIC PANEL
BUN/Creatinine Ratio: 15 (ref 10–24)
BUN: 15 mg/dL (ref 8–27)
CO2: 27 mmol/L (ref 20–29)
Calcium: 9.9 mg/dL (ref 8.6–10.2)
Chloride: 101 mmol/L (ref 96–106)
Creatinine, Ser: 1.02 mg/dL (ref 0.76–1.27)
GFR calc Af Amer: 82 mL/min/{1.73_m2} (ref >59)
GFR calc non Af Amer: 71 mL/min/{1.73_m2} (ref >59)
Glucose: 104 mg/dL — ABNORMAL HIGH (ref 65–99)
Potassium: 5 mmol/L (ref 3.5–5.2)
Sodium: 143 mmol/L (ref 134–144)

## 2019-09-14 ENCOUNTER — Telehealth: Payer: Self-pay | Admitting: Emergency Medicine

## 2019-09-14 DIAGNOSIS — I10 Essential (primary) hypertension: Secondary | ICD-10-CM

## 2019-09-14 NOTE — Telephone Encounter (Signed)
Left message for patient to return call regarding results  

## 2019-09-15 MED ORDER — LOSARTAN POTASSIUM 25 MG PO TABS: 25.0000 mg | ORAL_TABLET | Freq: Two times a day (BID) | ORAL | 1 refills | Status: DC

## 2019-09-15 NOTE — Addendum Note (Signed)
Addended by: Linna Hoff R on: 09/15/2019 11:09 AM   Modules accepted: Orders

## 2019-09-15 NOTE — Telephone Encounter (Signed)
Called patient. Informed him of test results and Dr. Wendy Poet recommendation to start losartan 25 mg twice daily and have labs redrawn in 1 week. Patient verbally understands. No further questions.

## 2019-10-08 ENCOUNTER — Telehealth: Payer: Self-pay | Admitting: Cardiology

## 2019-10-08 NOTE — Telephone Encounter (Signed)
Telephone call to patient. Explained we do not have tamsulosin on his med record and even so it is not a cardiac medication that we fill. He states he just came from [place] office (urology). Explained that Dr Nila Nephew or his PCP would need to fill this med.Patient had no further questions

## 2019-10-08 NOTE — Telephone Encounter (Signed)
*  STAT* If patient is at the pharmacy, call can be transferred to refill team.   1. Which medications need to be refilled? (please list name of each medication and dose if known) Tamsulosin  2. Which pharmacy/location (including street and city if local pharmacy) is medication to be sent to?[facility] in [place]  3. Do they need a 30 day or 90 day supply? [place]

## 2019-10-16 DIAGNOSIS — C221 Intrahepatic bile duct carcinoma: Secondary | ICD-10-CM

## 2019-10-16 DIAGNOSIS — C787 Secondary malignant neoplasm of liver and intrahepatic bile duct: Secondary | ICD-10-CM

## 2019-12-07 ENCOUNTER — Other Ambulatory Visit: Payer: Self-pay | Admitting: Cardiology

## 2020-04-14 DIAGNOSIS — C787 Secondary malignant neoplasm of liver and intrahepatic bile duct: Secondary | ICD-10-CM

## 2020-05-05 DIAGNOSIS — C221 Intrahepatic bile duct carcinoma: Secondary | ICD-10-CM

## 2020-05-05 DIAGNOSIS — C787 Secondary malignant neoplasm of liver and intrahepatic bile duct: Secondary | ICD-10-CM

## 2020-05-10 DIAGNOSIS — C221 Intrahepatic bile duct carcinoma: Secondary | ICD-10-CM

## 2020-06-02 DIAGNOSIS — C221 Intrahepatic bile duct carcinoma: Secondary | ICD-10-CM

## 2020-06-02 DIAGNOSIS — C787 Secondary malignant neoplasm of liver and intrahepatic bile duct: Secondary | ICD-10-CM

## 2020-06-09 ENCOUNTER — Telehealth: Payer: Self-pay

## 2020-06-09 NOTE — Telephone Encounter (Signed)
   Primary Cardiologist:Robert Agustin Cree, MD  Chart reviewed as part of pre-operative protocol coverage. Because of Gary [name]'s past medical history and time since last visit, they will require a follow-up visit in order to better assess preoperative cardiovascular risk.  Pre-op covering staff: - Please schedule appointment and call patient to inform them. Hopefully we can find a slot soon since his procedure is scheduled for 06/16/20.  - Please contact requesting surgeon's office via preferred method (i.e, phone, fax) to inform them of need for appointment prior to surgery.   Abigail Butts, PA-C  06/09/2020, 5:00 PM

## 2020-06-09 NOTE — Telephone Encounter (Signed)
   [facility] Pre-operative Risk Assessment    HEARTCARE STAFF: - Please ensure there is not already an duplicate clearance open for this procedure. - Under Visit Info/Reason for Call, type in Other and utilize the format Clearance MM/DD/YY or Clearance TBD. Do not use dashes or single digits. - If request is for dental extraction, please clarify the # of teeth to be extracted.  Request for surgical clearance:  1. What type of surgery is being performed? Excision of subcutaneous mass of abdominal wall   2. When is this surgery scheduled? 06-16-20   3. What type of clearance is required (medical clearance vs. Pharmacy clearance to hold med vs. Both)? Medical  4. Are there any medications that need to be held prior to surgery and how long?   5. Practice name and name of physician performing surgery? [facility]- Dr. Lilia Pro   6. What is the office phone number? [phone]   7.   What is the office fax number? [phone]  8.   Anesthesia type (None, local, MAC, general) ? General   Gary [name] 06/09/2020, 4:38 PM  _________________________________________________________________   (provider comments below)

## 2020-06-10 NOTE — Telephone Encounter (Signed)
Attempted to reach patient to offer him an appointment on 06/13/20 with Dr Harriet Masson In [place]. I left a message for him to return the call to the office.

## 2020-06-13 NOTE — Telephone Encounter (Signed)
Patient has been scheduled for an appointment on 06/15/2020 with Dr Agustin Cree. Will route back to requesting surgeons office to make them aware.

## 2020-06-13 NOTE — Telephone Encounter (Signed)
2nd attempt to reach patient. Left a voicemail asking patient to call the office to schedule an appointment.

## 2020-06-15 ENCOUNTER — Encounter: Payer: Self-pay | Admitting: Cardiology

## 2020-06-15 ENCOUNTER — Other Ambulatory Visit: Payer: Self-pay

## 2020-06-15 ENCOUNTER — Ambulatory Visit (INDEPENDENT_AMBULATORY_CARE_PROVIDER_SITE_OTHER): Payer: Medicare Other | Admitting: Cardiology

## 2020-06-15 VITALS — BP 120/80 | HR 85 | Ht 74.0 in | Wt 303.0 lb

## 2020-06-15 DIAGNOSIS — Z5181 Encounter for therapeutic drug level monitoring: Secondary | ICD-10-CM | POA: Insufficient documentation

## 2020-06-15 DIAGNOSIS — R0600 Dyspnea, unspecified: Secondary | ICD-10-CM

## 2020-06-15 DIAGNOSIS — I420 Dilated cardiomyopathy: Secondary | ICD-10-CM | POA: Diagnosis not present

## 2020-06-15 DIAGNOSIS — I10 Essential (primary) hypertension: Secondary | ICD-10-CM | POA: Diagnosis not present

## 2020-06-15 DIAGNOSIS — R0609 Other forms of dyspnea: Secondary | ICD-10-CM

## 2020-06-15 DIAGNOSIS — I361 Nonrheumatic tricuspid (valve) insufficiency: Secondary | ICD-10-CM

## 2020-06-15 DIAGNOSIS — I5032 Chronic diastolic (congestive) heart failure: Secondary | ICD-10-CM

## 2020-06-15 DIAGNOSIS — Z01810 Encounter for preprocedural cardiovascular examination: Secondary | ICD-10-CM

## 2020-06-15 HISTORY — DX: Encounter for therapeutic drug level monitoring: Z51.81

## 2020-06-15 HISTORY — DX: Encounter for preprocedural cardiovascular examination: Z01.810

## 2020-06-15 NOTE — Progress Notes (Signed)
Cardiology Office Note:    Date:  06/15/2020   ID:  Gary [name], DOB Feb 22, 1941, MRN [id number]  PCP:  Bonnita Nasuti, MD  Cardiologist:  Jenne Campus, MD    Referring MD: Bonnita Nasuti, MD   Chief Complaint  Patient presents with  . 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 male who was referred originally to [place] because of shortness of breath fatigue tiredness also history of mildly diminished ejection fraction with ejection fraction 45 to 50%, last echocardiogram done in September of last year exactly year ago showing ejection fraction 5055%, at the end of October of last year stress test was done which showed no evidence of ischemia.  He comes today to our office because he scheduled to have surgery tomorrow which cannot be abdominal surgery done under general anesthesia.  Overall he denies have any chest pain tightness squeezing pressure burning chest not much shortness of breath but he described fatigue and tiredness.  He does not have stairs at home however he is able to go to [place] and shop and when he does not he have to slow down because of fatigue tiredness.  No past medical history on file.    Current Medications: Current Meds  Medication Sig  . ALPRAZolam (XANAX) 0.5 MG tablet Take 0.5 mg by mouth 3 (three) times daily as needed.  Marland Kitchen buPROPion (WELLBUTRIN XL) 300 MG 24 hr tablet 300 mg in the morning and at bedtime.  . carvedilol (COREG) 3.125 MG tablet Take 3.125 mg by mouth 2 (two) times daily.  . cholecalciferol (VITAMIN D3) 25 MCG (1000 UNIT) tablet Take 1,000 Units by mouth daily.  Marland Kitchen ENTRESTO 24-26 MG 1 tablet daily.  Marland Kitchen HYDROcodone-acetaminophen (NORCO) 10-325 MG tablet Take 1 tablet by mouth 3 (three) times daily as needed.  Marland Kitchen ketoconazole (NIZORAL) 2 % cream   . losartan (COZAAR) 25 MG tablet Take 1 tablet by mouth twice daily  . montelukast (SINGULAIR) 10 MG  tablet 10 mg daily.  . Multiple Vitamin (MULTIVITAMIN) tablet Take 1 tablet by mouth daily.  . Omega-3 Fatty Acids (FISH OIL) 1000 MG CAPS Take by mouth daily.  Marland Kitchen omeprazole (PRILOSEC) 40 MG capsule Take 1 capsule by mouth daily.  . ondansetron (ZOFRAN) 4 MG tablet Take 1 tablet by mouth 2 (two) times daily as needed.  . tamsulosin (FLOMAX) 0.4 MG CAPS capsule 0.4 mg daily.  . vitamin B-12 (CYANOCOBALAMIN) 250 MCG tablet Take 250 mcg by mouth daily.     Allergies:   Pork-derived products and Amoxicillin   Social History   Socioeconomic History  . Marital status: Widowed    Spouse name: Not on file  . Number of children: Not on file  . Years of education: Not on file  . Highest education level: Not on file  Occupational History  . Not on file  Tobacco Use  . Smoking status: Never Smoker  . Smokeless tobacco: Current User    Types: Chew  Substance and Sexual Activity  . Alcohol use: Not Currently  . Drug use: Not on file  . Sexual activity: Not on file  Other Topics Concern  . Not on file  Social History Narrative  . Not on file   Social Determinants of Health   Financial Resource Strain:   . Difficulty of Paying Living Expenses: Not on file  Food Insecurity:   .  Worried About Charity fundraiser in the Last Year: Not on file  . Ran Out of Food in the Last Year: Not on file  Transportation Needs:   . Lack of Transportation (Medical): Not on file  . Lack of Transportation (Non-Medical): Not on file  Physical Activity:   . Days of Exercise per Week: Not on file  . Minutes of Exercise per Session: Not on file  Stress:   . Feeling of Stress : Not on file  Social Connections:   . Frequency of Communication with Friends and Family: Not on file  . Frequency of Social Gatherings with Friends and Family: Not on file  . Attends Religious Services: Not on file  . Active Member of Clubs or Organizations: Not on file  . Attends Archivist Meetings: Not on file  .  Marital Status: Not on file     Family History: The patient's family history is not on file. ROS:   Please see the history of present illness.    All 14 point review of systems negative except as described per history of present illness  EKGs/Labs/Other Studies Reviewed:      Recent Labs: 09/09/2019: BUN 15; Creatinine, Ser 1.02; Potassium 5.0; Sodium 143  Recent Lipid Panel No results found for: CHOL, TRIG, HDL, CHOLHDL, VLDL, LDLCALC, LDLDIRECT  Physical Exam:    VS:  BP 120/80 (BP Location: Left Arm, Patient Position: Sitting, Cuff Size: Large)   Pulse 85   Ht 6\' 2"  (1.88 m)   Wt (!) 303 lb (137.4 kg)   SpO2 95%   BMI 38.90 kg/m     Wt Readings from Last 3 Encounters:  06/15/20 (!) 303 lb (137.4 kg)  09/09/19 297 lb (134.7 kg)  07/14/19 298 lb (135.2 kg)     GEN:  Well nourished, well developed in no acute distress HEENT: Normal NECK: No JVD; No carotid bruits LYMPHATICS: No lymphadenopathy CARDIAC: RRR, no murmurs, no rubs, no gallops RESPIRATORY:  Clear to auscultation without rales, wheezing or rhonchi  ABDOMEN: Soft, non-tender, non-distended MUSCULOSKELETAL:  No edema; No deformity  SKIN: Warm and dry LOWER EXTREMITIES: no swelling NEUROLOGIC:  Alert and oriented x 3 PSYCHIATRIC:  Normal affect   ASSESSMENT:    1. 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 Chronic diastolic congestive heart failure ([place])   3. Essential hypertension   4. Dyspnea on exertion   5. Preop cardiovascular exam    PLAN:    In order of problems listed above:  1. Dilated cardiomyopathy with ejection fraction 45 to 50% in April 2019 improvement to 5055% with proper medical management as per echocardiogram done year ago.  As a part of pre up evaluation I will repeat his echocardiogram because of fatigue tiredness and shortness of breath.  I do not think he needs any ischemia work-up at this stage since he had no symptoms and he did have stress  test done less than a year ago. 2. Chronic diastolic congestive heart failure.  On the physical exam he is compensated.  We will continue with Coreg, Entresto, diuretic on as-needed basis. 3. Essential hypertension: His blood pressure today 120/80.  Well-controlled continue present management. 4. Preop cardiovascular evaluation.  We will repeat echocardiogram to recheck left ventricle ejection fraction, if echocardiogram shows still a reasonable left ventricle ejection fraction will proceed with surgery as scheduled.   Medication Adjustments/Labs and Tests Ordered: Current medicines are reviewed at length with the patient today.  Concerns regarding medicines are outlined above.  No orders of the defined types were placed in this encounter.  Medication changes: No orders of the defined types were placed in this encounter.   Signed, Park Liter, MD, [facility] 06/15/2020 9:02 AM    [place]

## 2020-06-15 NOTE — Patient Instructions (Signed)
Medication Instructions:  °Your physician recommends that you continue on your current medications as directed. Please refer to the Current Medication list given to you today. ° °*If you need a refill on your cardiac medications before your next appointment, please call your pharmacy* ° ° °Lab Work: °None. ° °If you have labs (blood work) drawn today and your tests are completely normal, you will receive your results only by: °• MyChart Message (if you have MyChart) OR °• A paper copy in the mail °If you have any lab test that is abnormal or we need to change your treatment, we will call you to review the results. ° ° °Testing/Procedures: °Your physician has requested that you have an echocardiogram. Echocardiography is a painless test that uses sound waves to create images of your heart. It provides your doctor with information about the size and shape of your heart and how well your heart’s chambers and valves are working. This procedure takes approximately one hour. There are no restrictions for this procedure. ° ° ° ° °Follow-Up: °At [facility], you and your health needs are our priority.  As part of our continuing mission to provide you with exceptional heart care, we have created designated Provider Care Teams.  These Care Teams include your primary Cardiologist (physician) and Advanced Practice Providers (APPs -  Physician Assistants and Nurse Practitioners) who all work together to provide you with the care you need, when you need it. ° °We recommend signing up for the patient portal called "MyChart".  Sign up information is provided on this After Visit Summary.  MyChart is used to connect with patients for Virtual Visits (Telemedicine).  Patients are able to view lab/test results, encounter notes, upcoming appointments, etc.  Non-urgent messages can be sent to your provider as well.   °To learn more about what you can do with MyChart, go to [email or web address].   ° °Your next appointment:   °6  month(s) ° °The format for your next appointment:   °In Person ° °Provider:   °Robert Krasowski, MD ° ° °Other Instructions ° ° °Echocardiogram °An echocardiogram is a procedure that uses painless sound waves (ultrasound) to produce an image of the heart. Images from an echocardiogram can provide important information about: °· Signs of coronary artery disease (CAD). °· Aneurysm detection. An aneurysm is a weak or damaged part of an artery wall that bulges out from the normal force of blood pumping through the body. °· Heart size and shape. Changes in the size or shape of the heart can be associated with certain conditions, including heart failure, aneurysm, and CAD. °· Heart muscle function. °· Heart valve function. °· Signs of a past heart attack. °· Fluid buildup around the heart. °· Thickening of the heart muscle. °· A tumor or infectious growth around the heart valves. °Tell a health care provider about: °· Any allergies you have. °· All medicines you are taking, including vitamins, herbs, eye drops, creams, and over-the-counter medicines. °· Any blood disorders you have. °· Any surgeries you have had. °· Any medical conditions you have. °· Whether you are pregnant or may be pregnant. °What are the risks? °Generally, this is a safe procedure. However, problems may occur, including: °· Allergic reaction to dye (contrast) that may be used during the procedure. °What happens before the procedure? °No specific preparation is needed. You may eat and drink normally. °What happens during the procedure? ° °· An IV tube may be inserted into one of your veins. °· You may   receive contrast through this tube. A contrast is an injection that improves the quality of the pictures from your heart. °· A gel will be applied to your chest. °· A wand-like tool (transducer) will be moved over your chest. The gel will help to transmit the sound waves from the transducer. °· The sound waves will harmlessly bounce off of your heart to  allow the heart images to be captured in real-time motion. The images will be recorded on a computer. °The procedure may vary among health care providers and hospitals. °What happens after the procedure? °· You may return to your normal, everyday life, including diet, activities, and medicines, unless your health care provider tells you not to do that. °Summary °· An echocardiogram is a procedure that uses painless sound waves (ultrasound) to produce an image of the heart. °· Images from an echocardiogram can provide important information about the size and shape of your heart, heart muscle function, heart valve function, and fluid buildup around your heart. °· You do not need to do anything to prepare before this procedure. You may eat and drink normally. °· After the echocardiogram is completed, you may return to your normal, everyday life, unless your health care provider tells you not to do that. °This information is not intended to replace advice given to you by your health care provider. Make sure you discuss any questions you have with your health care provider. °Document Revised: 01/01/2019 Document Reviewed: 10/13/2016 °Elsevier Patient Education © 2020 [employer] ° ° °

## 2020-07-08 ENCOUNTER — Telehealth: Payer: Self-pay | Admitting: Cardiology

## 2020-07-08 ENCOUNTER — Encounter: Payer: Self-pay | Admitting: Cardiology

## 2020-07-08 ENCOUNTER — Ambulatory Visit: Payer: Medicare Other | Admitting: Cardiology

## 2020-07-08 ENCOUNTER — Ambulatory Visit (HOSPITAL_BASED_OUTPATIENT_CLINIC_OR_DEPARTMENT_OTHER)
Admission: RE | Admit: 2020-07-08 | Discharge: 2020-07-08 | Disposition: A | Discharge status: Home / Self Care | Payer: Medicare Other | Source: Ambulatory Visit | Attending: Cardiology | Admitting: Cardiology

## 2020-07-08 ENCOUNTER — Encounter (HOSPITAL_COMMUNITY): Payer: Self-pay | Admitting: Emergency Medicine

## 2020-07-08 ENCOUNTER — Ambulatory Visit (INDEPENDENT_AMBULATORY_CARE_PROVIDER_SITE_OTHER): Payer: Medicare Other | Admitting: Cardiology

## 2020-07-08 ENCOUNTER — Other Ambulatory Visit: Payer: Self-pay

## 2020-07-08 ENCOUNTER — Emergency Department (HOSPITAL_COMMUNITY): Payer: Medicare Other

## 2020-07-08 ENCOUNTER — Inpatient Hospital Stay (HOSPITAL_COMMUNITY)
Admission: EM | Admit: 2020-07-08 | Discharge: 2020-07-13 | DRG: 247 | Disposition: A | Discharge status: Home / Self Care | Payer: Medicare Other | Attending: Student | Admitting: Student

## 2020-07-08 VITALS — BP 120/80 | HR 67 | Ht 74.0 in | Wt 300.0 lb

## 2020-07-08 DIAGNOSIS — C221 Intrahepatic bile duct carcinoma: Secondary | ICD-10-CM | POA: Diagnosis not present

## 2020-07-08 DIAGNOSIS — Z955 Presence of coronary angioplasty implant and graft: Secondary | ICD-10-CM

## 2020-07-08 DIAGNOSIS — R0602 Shortness of breath: Secondary | ICD-10-CM

## 2020-07-08 DIAGNOSIS — M545 Low back pain, unspecified: Secondary | ICD-10-CM | POA: Diagnosis present

## 2020-07-08 DIAGNOSIS — I214 Non-ST elevation (NSTEMI) myocardial infarction: Principal | ICD-10-CM

## 2020-07-08 DIAGNOSIS — I5030 Unspecified diastolic (congestive) heart failure: Secondary | ICD-10-CM | POA: Diagnosis present

## 2020-07-08 DIAGNOSIS — Z20822 Contact with and (suspected) exposure to covid-19: Secondary | ICD-10-CM | POA: Diagnosis present

## 2020-07-08 DIAGNOSIS — R519 Headache, unspecified: Secondary | ICD-10-CM | POA: Diagnosis not present

## 2020-07-08 DIAGNOSIS — F419 Anxiety disorder, unspecified: Secondary | ICD-10-CM | POA: Diagnosis present

## 2020-07-08 DIAGNOSIS — R072 Precordial pain: Secondary | ICD-10-CM

## 2020-07-08 DIAGNOSIS — Z880 Allergy status to penicillin: Secondary | ICD-10-CM

## 2020-07-08 DIAGNOSIS — I420 Dilated cardiomyopathy: Secondary | ICD-10-CM | POA: Diagnosis present

## 2020-07-08 DIAGNOSIS — R079 Chest pain, unspecified: Secondary | ICD-10-CM | POA: Diagnosis not present

## 2020-07-08 DIAGNOSIS — I77819 Aortic ectasia, unspecified site: Secondary | ICD-10-CM | POA: Diagnosis present

## 2020-07-08 DIAGNOSIS — Z6837 Body mass index (BMI) 37.0-37.9, adult: Secondary | ICD-10-CM

## 2020-07-08 DIAGNOSIS — G8929 Other chronic pain: Secondary | ICD-10-CM | POA: Diagnosis present

## 2020-07-08 DIAGNOSIS — I10 Essential (primary) hypertension: Secondary | ICD-10-CM | POA: Diagnosis present

## 2020-07-08 DIAGNOSIS — N179 Acute kidney failure, unspecified: Secondary | ICD-10-CM

## 2020-07-08 DIAGNOSIS — I2510 Atherosclerotic heart disease of native coronary artery without angina pectoris: Secondary | ICD-10-CM | POA: Diagnosis present

## 2020-07-08 DIAGNOSIS — Z87892 Personal history of anaphylaxis: Secondary | ICD-10-CM

## 2020-07-08 DIAGNOSIS — I5032 Chronic diastolic (congestive) heart failure: Secondary | ICD-10-CM | POA: Diagnosis present

## 2020-07-08 DIAGNOSIS — I951 Orthostatic hypotension: Secondary | ICD-10-CM | POA: Diagnosis not present

## 2020-07-08 DIAGNOSIS — C787 Secondary malignant neoplasm of liver and intrahepatic bile duct: Secondary | ICD-10-CM

## 2020-07-08 DIAGNOSIS — Z87891 Personal history of nicotine dependence: Secondary | ICD-10-CM

## 2020-07-08 DIAGNOSIS — Z720 Tobacco use: Secondary | ICD-10-CM

## 2020-07-08 DIAGNOSIS — I110 Hypertensive heart disease with heart failure: Secondary | ICD-10-CM | POA: Diagnosis present

## 2020-07-08 DIAGNOSIS — K219 Gastro-esophageal reflux disease without esophagitis: Secondary | ICD-10-CM | POA: Diagnosis present

## 2020-07-08 DIAGNOSIS — Z7982 Long term (current) use of aspirin: Secondary | ICD-10-CM

## 2020-07-08 DIAGNOSIS — M1990 Unspecified osteoarthritis, unspecified site: Secondary | ICD-10-CM | POA: Diagnosis present

## 2020-07-08 DIAGNOSIS — N400 Enlarged prostate without lower urinary tract symptoms: Secondary | ICD-10-CM | POA: Diagnosis present

## 2020-07-08 DIAGNOSIS — Z79899 Other long term (current) drug therapy: Secondary | ICD-10-CM

## 2020-07-08 DIAGNOSIS — Z91014 Allergy to mammalian meats: Secondary | ICD-10-CM

## 2020-07-08 HISTORY — DX: Heart failure, unspecified: I50.9

## 2020-07-08 HISTORY — DX: Unspecified osteoarthritis, unspecified site: M19.90

## 2020-07-08 HISTORY — DX: Anxiety disorder, unspecified: F41.9

## 2020-07-08 HISTORY — DX: Essential (primary) hypertension: I10

## 2020-07-08 LAB — CBC
HCT: 43 % (ref 39.0–52.0)
Hemoglobin: 13.3 g/dL (ref 13.0–17.0)
MCH: 29.3 pg (ref 26.0–34.0)
MCHC: 30.9 g/dL (ref 30.0–36.0)
MCV: 94.7 fL (ref 80.0–100.0)
Platelets: 153 10*3/uL (ref 150–400)
RBC: 4.54 MIL/uL (ref 4.22–5.81)
RDW: 13.6 % (ref 11.5–15.5)
WBC: 6.8 10*3/uL (ref 4.0–10.5)
nRBC: 0 % (ref 0.0–0.2)

## 2020-07-08 LAB — BASIC METABOLIC PANEL
Anion gap: 9 (ref 5–15)
BUN: 17 mg/dL (ref 8–23)
CO2: 25 mmol/L (ref 22–32)
Calcium: 9 mg/dL (ref 8.9–10.3)
Chloride: 107 mmol/L (ref 98–111)
Creatinine, Ser: 1.38 mg/dL — ABNORMAL HIGH (ref 0.61–1.24)
GFR, Estimated: 49 mL/min — ABNORMAL LOW (ref >60)
Glucose, Bld: 138 mg/dL — ABNORMAL HIGH (ref 70–99)
Potassium: 4.6 mmol/L (ref 3.5–5.1)
Sodium: 141 mmol/L (ref 135–145)

## 2020-07-08 LAB — PROTIME-INR
INR: 1 (ref 0.8–1.2)
Prothrombin Time: 13 seconds (ref 11.4–15.2)

## 2020-07-08 LAB — TROPONIN I (HIGH SENSITIVITY): Troponin I (High Sensitivity): 221 ng/L (ref <18)

## 2020-07-08 MED ORDER — IOHEXOL 350 MG/ML SOLN
100.0000 mL | Freq: Once | INTRAVENOUS | Status: AC | PRN
  Administered 2020-07-08: 73 mL via INTRAVENOUS

## 2020-07-08 MED ORDER — NITROGLYCERIN 0.4 MG SL SUBL: 0.4000 mg | SUBLINGUAL_TABLET | SUBLINGUAL | 0 refills | Status: DC | PRN

## 2020-07-08 MED ORDER — ASPIRIN EC 81 MG PO TBEC: 81.0000 mg | DELAYED_RELEASE_TABLET | Freq: Every day | ORAL | 0 refills | Status: AC

## 2020-07-08 MED ORDER — FUROSEMIDE 20 MG PO TABS: 20.0000 mg | ORAL_TABLET | Freq: Every day | ORAL | 0 refills | Status: DC

## 2020-07-08 MED ORDER — ISOSORBIDE MONONITRATE ER 30 MG PO TB24: 30.0000 mg | ORAL_TABLET | Freq: Every day | ORAL | 0 refills | Status: DC

## 2020-07-08 NOTE — Progress Notes (Signed)
Cardiology Office Note:    Date:  07/08/2020   ID:  Gary [name], DOB 1941/02/23, MRN [id number]  PCP:  Bonnita Nasuti, MD  Cardiologist:  Shirlee More, MD    Referring MD: Bonnita Nasuti, MD    ASSESSMENT:    1. Chest pain of uncertain etiology   2. Shortness of breath   3. Cholangiocarcinoma ([place])   4. 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 There is a very complicated visit presenting with shortness of breath chest pain in the context of metastatic cancer.  Differential diagnosis includes cardiac ischemia heart failure and venous thromboembolism.  We will have a quick evaluation performed including stat troponin D-dimer proBNP and referral for CTA pulmonary embolism protocol.  He will be given a new prescription for nitroglycerin start back on his diuretic furosemide and oral mononitrate and continue his beta-blocker and Entresto.  He will be worked into Dr. Agustin Cree schedule early next week for continuity of care.  If he has pulmonary embolism I will direct him to the hospital for further evaluation and treatment with his high risk with cancer   Next appointment: Early next week   Medication Adjustments/Labs and Tests Ordered: Current medicines are reviewed at length with the patient today.  Concerns regarding medicines are outlined above.  No orders of the defined types were placed in this encounter.  No orders of the defined types were placed in this encounter.   Chief complaint sent to the office by the cancer center shortness of breath and chest pain  History of Present Illness:    Gary [name] is a 79 y.o. male with a hx of cardiomyopathy most recent ejection fraction low normal 50 to 55% and hypertension last seen 06/15/2020.  He had a myocardial perfusion study performed October 2020 that showed ejection fraction of 50% and a fixed defect anterior septal wall felt  to be due to soft tissue attenuation but there is no description of segmental left ventricular function.   He is followed in the oncology program at [facility] for metastatic intrahepatic cholangiocarcinoma.  He had partial resection of the liver and lymph node dissection.  At [facility] 01/01/2018.  An echocardiogram at that time showed an ejection fraction of 45 to 50% with elevated filling pressure.   Records from [facility] echocardiogram 06/15/2020 shows normal left ventricular systolic function and filling pressure mild concentric LVH and mild left atrial enlargement ejection fraction visually estimated 50% low normal.  CT abdomen 12/08/2019 shows finding status post cholecystectomy and mild to moderately severe calcification of the aorta.  MRI of the abdomen 04/13/2020 showed soft tissue nodule raising concern for metastatic cancer.  Compliance with diet, lifestyle and medications: Yes  This is become a very complicated visit I do not have records but he tells me about 3 weeks with surgery at [facility] for a abdominal wall mass which was cancer and was incompletely resected.  He was coming to be seen by them to discuss treatment options today.  He has no known history of CAD but somebody given a prescription for nitroglycerin in the past and was available in his home.  A week ago he had an episode getting ready for bed not exertional substernal pressure radiates to the left upper extremity relieved in 5 to 10 minutes with rest.  Monday evening he had another episode of bedtime more  severe and he took nitroglycerin with relief.  He another episode last night and again took nitroglycerin with relief.  He has been short of breath when he does activities like walking to the mailbox but no orthopnea was unaware he had peripheral edema.  When he had started Entresto few months ago his diuretic was discontinued and he has been chronically breathless.  His chest pain was not  pleuritic he has no known history of DVT or pulmonary embolism but is a very high risk with his malignancy his EKG does not show acute ischemic changes now for quick evaluation for pulmonary embolism.  I will restart his diuretic and placed on oral nitrate.  And be worked in to see Dr. Agustin Cree early next week.  Current Medications: Current Meds  Medication Sig  . ALPRAZolam (XANAX) 0.5 MG tablet Take 0.5 mg by mouth 3 (three) times daily as needed.  Marland Kitchen buPROPion (WELLBUTRIN XL) 300 MG 24 hr tablet 300 mg in the morning and at bedtime.  . carvedilol (COREG) 3.125 MG tablet Take 3.125 mg by mouth 2 (two) times daily.  . cholecalciferol (VITAMIN D3) 25 MCG (1000 UNIT) tablet Take 1,000 Units by mouth daily.  Marland Kitchen ENTRESTO 24-26 MG 1 tablet daily.  Marland Kitchen HYDROcodone-acetaminophen (NORCO) 10-325 MG tablet Take 1 tablet by mouth 3 (three) times daily as needed.  Marland Kitchen ketoconazole (NIZORAL) 2 % cream   . losartan (COZAAR) 25 MG tablet Take 1 tablet by mouth twice daily  . montelukast (SINGULAIR) 10 MG tablet 10 mg daily.  . Multiple Vitamin (MULTIVITAMIN) tablet Take 1 tablet by mouth daily.  . Omega-3 Fatty Acids (FISH OIL) 1000 MG CAPS Take by mouth daily.  Marland Kitchen omeprazole (PRILOSEC) 40 MG capsule Take 1 capsule by mouth daily.  . ondansetron (ZOFRAN) 4 MG tablet Take 1 tablet by mouth 2 (two) times daily as needed.  . tamsulosin (FLOMAX) 0.4 MG CAPS capsule 0.4 mg daily.  . vitamin B-12 (CYANOCOBALAMIN) 250 MCG tablet Take 250 mcg by mouth daily.     Allergies:   Pork-derived products and Amoxicillin   Social History   Socioeconomic History  . Marital status: Widowed    Spouse name: Not on file  . Number of children: Not on file  . Years of education: Not on file  . Highest education level: Not on file  Occupational History  . Not on file  Tobacco Use  . Smoking status: Never Smoker  . Smokeless tobacco: Current User    Types: Chew  Substance and Sexual Activity  . Alcohol use: Not Currently    . Drug use: Not on file  . Sexual activity: Not on file  Other Topics Concern  . Not on file  Social History Narrative  . Not on file   Social Determinants of Health   Financial Resource Strain:   . Difficulty of Paying Living Expenses: Not on file  Food Insecurity:   . Worried About Charity fundraiser in the Last Year: Not on file  . Ran Out of Food in the Last Year: Not on file  Transportation Needs:   . Lack of Transportation (Medical): Not on file  . Lack of Transportation (Non-Medical): Not on file  Physical Activity:   . Days of Exercise per Week: Not on file  . Minutes of Exercise per Session: Not on file  Stress:   . Feeling of Stress : Not on file  Social Connections:   . Frequency of Communication with Friends and Family: Not on  file  . Frequency of Social Gatherings with Friends and Family: Not on file  . Attends Religious Services: Not on file  . Active Member of Clubs or Organizations: Not on file  . Attends Archivist Meetings: Not on file  . Marital Status: Not on file     Family History: The patient's family history is not on file. ROS:   Please see the history of present illness.    All other systems reviewed and are negative.  EKGs/Labs/Other Studies Reviewed:    The following studies were reviewed today:  EKG:  EKG ordered today and personally reviewed.  The ekg ordered today demonstrates sinus rhythm nonspecific T wave  Recent Labs: Requested from [facility] at his recent surgery 09/09/2019: BUN 15; Creatinine, Ser 1.02; Potassium 5.0; Sodium 143     Physical Exam:    VS:  BP 120/80   Pulse 67   Ht 6\' 2"  (1.88 m)   Wt 300 lb (136.1 kg)   SpO2 94%   BMI 38.52 kg/m     Wt Readings from Last 3 Encounters:  07/08/20 300 lb (136.1 kg)  06/15/20 (!) 303 lb (137.4 kg)  09/09/19 297 lb (134.7 kg)     GEN: He is not breathless does not look acutely ill well nourished, well developed in no acute distress HEENT: Normal NECK:  Mild to moderate JVD; No carotid bruits LYMPHATICS: No lymphadenopathy CARDIAC: Soft S1 RRR, no murmurs, rubs, gallops RESPIRATORY:  Clear to auscultation without rales, wheezing or rhonchi  ABDOMEN: Soft, non-tender, non-distended MUSCULOSKELETAL: 2+ bilateral lower extremity pitting edema; No deformity  SKIN: Warm and dry NEUROLOGIC:  Alert and oriented x 3 PSYCHIATRIC:  Normal affect    Signed, Shirlee More, MD  07/08/2020 1:36 PM    [facility]

## 2020-07-08 NOTE — Progress Notes (Signed)
Rx was printed.

## 2020-07-08 NOTE — ED Provider Notes (Signed)
[facility] EMERGENCY DEPARTMENT Provider Note   CSN: [id number] Arrival date & time: 07/08/20  2102     History Chief Complaint  Patient presents with   Chest Pain    Gary [name] is a 79 y.o. male.  HPI  HPI: A 79 year old patient with a history of hypertension presents for evaluation of chest pain. Initial onset of pain was more than 6 hours ago. The patient's chest pain is described as heaviness/pressure/tightness and is not worse with exertion. The patient's chest pain is middle- or left-sided, is not well-localized, is not sharp and does radiate to the arms/jaw/neck. The patient does not complain of nausea and denies diaphoresis. The patient has no history of stroke, has no history of peripheral artery disease, has not smoked in the past 90 days, denies any history of treated diabetes, has no relevant family history of coronary artery disease (first degree relative at less than age 48), has no history of hypercholesterolemia and does not have an elevated BMI (>=30).  Patient reports he has had up to 3 episodes of chest pain in the past week.  Reports the pain is in the left chest and goes up into his left axilla.  He reports shortness of breath.  He does report recent "cancer surgery "about 3 weeks ago in [place]. Patient reports he did see his cardiologist and was sent in for further evaluation  PMH-CHF Patient Active Problem List   Diagnosis Date Noted   Preop cardiovascular exam 06/15/2020   Dilated cardiomyopathy ([place]) ejection fraction 45 to 50% in April 2019 04/24/2019   Dyspnea on exertion 38/93/7342   Diastolic congestive heart failure ([place]) 04/24/2019   Essential hypertension 04/24/2019    History reviewed. No pertinent surgical history.     No family history on file.  Social History   Tobacco Use   Smoking status: Never Smoker   Smokeless tobacco: Current User    Types: Chew  Substance Use Topics   Alcohol use: Not  Currently   Drug use: Not on file    Home Medications Prior to Admission medications   Medication Sig Start Date End Date Taking? Authorizing Provider  ALPRAZolam Duanne Moron) 0.5 MG tablet Take 0.5 mg by mouth 3 (three) times daily as needed. 04/09/19   [provider]  aspirin EC 81 MG tablet Take 1 tablet (81 mg total) by mouth daily. Swallow whole. 07/08/20   Richardo Priest, MD  buPROPion (WELLBUTRIN XL) 300 MG 24 hr tablet 300 mg in the morning and at bedtime. 04/05/20   [provider]  carvedilol (COREG) 3.125 MG tablet Take 3.125 mg by mouth 2 (two) times daily. 04/07/19   [provider]  cholecalciferol (VITAMIN D3) 25 MCG (1000 UNIT) tablet Take 1,000 Units by mouth daily.    [provider]  ENTRESTO 24-26 MG 1 tablet daily. 04/04/20   [provider]  furosemide (LASIX) 20 MG tablet Take 1 tablet (20 mg total) by mouth daily. 07/08/20   Richardo Priest, MD  HYDROcodone-acetaminophen (NORCO) 10-325 MG tablet Take 1 tablet by mouth 3 (three) times daily as needed. 04/08/19   [provider]  isosorbide mononitrate (IMDUR) 30 MG 24 hr tablet Take 1 tablet (30 mg total) by mouth daily. 07/08/20   Richardo Priest, MD  ketoconazole (NIZORAL) 2 % cream  06/11/20   [provider]  losartan (COZAAR) 25 MG tablet Take 1 tablet by mouth twice daily 12/07/19   Park Liter, MD  montelukast (SINGULAIR) 10 MG tablet 10 mg daily. 04/09/20   [provider]  Multiple Vitamin (MULTIVITAMIN) tablet Take 1 tablet by mouth daily.    [provider]  nitroGLYCERIN (NITROSTAT) 0.4 MG SL tablet Place 1 tablet (0.4 mg total) under the tongue every 5 (five) minutes as needed for chest pain. 07/08/20   Richardo Priest, MD  Omega-3 Fatty Acids (FISH OIL) 1000 MG CAPS Take by mouth daily.    [provider]  omeprazole (PRILOSEC) 40 MG capsule Take 1 capsule by mouth daily. 04/07/19   [provider]  ondansetron  (ZOFRAN) 4 MG tablet Take 1 tablet by mouth 2 (two) times daily as needed. 12/20/17   [provider]  tamsulosin (FLOMAX) 0.4 MG CAPS capsule 0.4 mg daily. 06/11/20   [provider]  vitamin B-12 (CYANOCOBALAMIN) 250 MCG tablet Take 250 mcg by mouth daily.    [provider]    Allergies    Pork-derived products and Amoxicillin  Review of Systems   Review of Systems  Constitutional: Negative for fever.  Respiratory: Positive for shortness of breath.   Cardiovascular: Positive for chest pain.  Gastrointestinal: Negative for vomiting.  All other systems reviewed and are negative.   Physical Exam Updated Vital Signs BP 128/70 (BP Location: Right Arm)    Pulse 73    Temp 98.7 F (37.1 C) (Oral)    Resp 20    Ht 1.88 m (6\' 2" )    Wt (!) 150 kg    SpO2 94%    BMI 42.46 kg/m   Physical Exam CONSTITUTIONAL: Elderly, no acute distress HEAD: Normocephalic/atraumatic EYES: EOMI/PERRL ENMT: Mucous membranes moist NECK: supple no meningeal signs SPINE/BACK:entire spine nontender CV: S1/S2 noted, no murmurs/rubs/gallops noted LUNGS: Lungs are clear to auscultation bilaterally, no apparent distress ABDOMEN: soft, nontender, no rebound or guarding, bowel sounds noted throughout abdomen, healing midline incision noted GU:no cva tenderness NEURO: Pt is awake/alert/appropriate, moves all extremitiesx4.  No facial droop.   EXTREMITIES: pulses normal/equalx4, full ROM SKIN: warm, color normal PSYCH: no abnormalities of mood noted, alert and oriented to situation  ED Results / Procedures / Treatments   Labs (all labs ordered are listed, but only abnormal results are displayed) Labs Reviewed  BASIC METABOLIC PANEL - Abnormal; Notable for the following components:      Result Value   Glucose, Bld 138 (*)    Creatinine, Ser 1.38 (*)    GFR, Estimated 49 (*)    All other components within normal limits  TROPONIN I (HIGH SENSITIVITY) - Abnormal; Notable for the  following components:   Troponin I (High Sensitivity) 221 (*)    All other components within normal limits  TROPONIN I (HIGH SENSITIVITY) - Abnormal; Notable for the following components:   Troponin I (High Sensitivity) 187 (*)    All other components within normal limits  RESPIRATORY PANEL BY RT PCR (FLU A&B, COVID)  CBC  PROTIME-INR    EKG EKG Interpretation  Date/Time:  Friday July 08 2020 21:14:37 EDT Ventricular Rate:  75 PR Interval:  166 QRS Duration: 90 QT Interval:  336 QTC Calculation: 375 R Axis:   8 Text Interpretation: Normal sinus rhythm ST & T wave abnormality, consider inferior ischemia Abnormal ECG No previous ECGs available Confirmed by Ripley Fraise [phone]) on 07/08/2020 11:36:25 PM   Radiology DG Chest 2 View  Result Date: 07/08/2020 CLINICAL DATA:  Chest pain EXAM: CHEST - 2 VIEW COMPARISON:  CTA from earlier in the same day.  FINDINGS: Cardiac shadow is within normal limits. Lungs are well aerated bilaterally. No focal infiltrate or sizable effusion is seen. No bony abnormality is noted. IMPRESSION: No acute abnormality seen. Electronically Signed   By: Inez Catalina M.D.   On: 07/08/2020 22:12   CT ANGIO CHEST PE W OR WO CONTRAST  Result Date: 07/08/2020 CLINICAL DATA:  Chest pain and shortness of breath EXAM: CT ANGIOGRAPHY CHEST WITH CONTRAST TECHNIQUE: Multidetector CT imaging of the chest was performed using the standard protocol during bolus administration of intravenous contrast. Multiplanar CT image reconstructions and MIPs were obtained to evaluate the vascular anatomy. CONTRAST:  54mL OMNIPAQUE IOHEXOL 350 MG/ML SOLN COMPARISON:  05/09/2020 PET-CT FINDINGS: Cardiovascular: Thoracic aorta demonstrates atherosclerotic calcifications without aneurysmal dilatation or dissection. No cardiac enlargement is seen. The coronary arteries demonstrate diffuse calcifications. The pulmonary artery shows a normal branching pattern without definitive filling  defect to suggest pulmonary embolism. Mediastinum/Nodes: Thoracic inlet is within normal limits. No sizable hilar or mediastinal adenopathy is noted. The esophagus as visualized is normal limits. Lungs/Pleura: The lungs are well aerated bilaterally. No focal infiltrate or sizable effusion is seen. No pneumothorax or sizable parenchymal nodule is noted. Some stable perivascular nodularity is noted unchanged from 2017 consistent with a benign etiology. Upper Abdomen: Visualized upper abdomen shows changes of prior cholecystectomy. An area of inflammatory changes noted in the subcutaneous tissues in an area of prior hypermetabolic nodule. A small lymph node is noted in the abdomen anterior to the stomach stable in appearance from the prior exam. Musculoskeletal: Degenerative changes of the thoracic spine are noted. Review of the MIP images confirms the above findings. IMPRESSION: No evidence of pulmonary emboli. Stable nodularity within the right middle lobe unchanged from 2017 consistent with a benign etiology. Previously seen nodularity in the anterior abdominal wall is less prominent with a small fluid collection identified. Correlate with any recent excision in this region. Aortic Atherosclerosis (ICD10-I70.0). Electronically Signed   By: Inez Catalina M.D.   On: 07/08/2020 17:04    Procedures .Critical Care Performed by: Ripley Fraise, MD Authorized by: Ripley Fraise, MD   Critical care provider statement:    Critical care time (minutes):  41   Critical care start time:  07/08/2020 11:50 PM   Critical care end time:  07/09/2020 12:31 AM   Critical care time was exclusive of:  Separately billable procedures and treating other patients   Critical care was necessary to treat or prevent imminent or life-threatening deterioration of the following conditions:  Cardiac failure   Critical care was time spent personally by me on the following activities:  Discussions with consultants, development of  treatment plan with patient or surrogate, examination of patient, obtaining history from patient or surrogate, re-evaluation of patient's condition, ordering and review of radiographic studies, ordering and review of laboratory studies, ordering and performing treatments and interventions, pulse oximetry and review of old charts   I assumed direction of critical care for this patient from another provider in my specialty: no      Medications Ordered in ED Medications  aspirin chewable tablet 324 mg (has no administration in time range)    ED Course  I have reviewed the triage vital signs and the nursing notes.  Pertinent labs & imaging results that were available during my care of the patient were reviewed by me and considered in my medical decision making (see chart for details).    MDM Rules/Calculators/A&P HEAR Score: 5  11:53 PM Patient presents with chest pain.  He has had up to 3 episodes the past week.  He was sent in by his cardiologist.  He has an elevated troponin but no acute EKG changes.  Will consult cardiology 12:31 AM Discussed w/cardiology.  They are aware of patient.  They request medical admission because patient has a history of cholangiocarcinoma with recent surgery.  Patient will need to be admitted for a non-STEMI. Pt updated on plan No CP at this time 12:57 AM D/w dr Tonie Griffith for admission to medical service Pt CP free Recent CT chest negative  Final Clinical Impression(s) / ED Diagnoses Final diagnoses:  Non-STEMI (non-ST elevated myocardial infarction) [facility])    Rx / [place] Orders ED Discharge Orders    None       Ripley Fraise, MD 07/09/20 [phone]

## 2020-07-08 NOTE — Progress Notes (Deleted)
Cardiology Office Note:    Date:  07/08/2020   ID:  Gary [name], DOB 1940/12/26, MRN [id number]  PCP:  Bonnita Nasuti, MD  Cardiologist:  Shirlee More, MD    Referring MD: Bonnita Nasuti, MD    ASSESSMENT:    No diagnosis found. PLAN:    In order of problems listed above:  1. ***   Next appointment: ***   Medication Adjustments/Labs and Tests Ordered: Current medicines are reviewed at length with the patient today.  Concerns regarding medicines are outlined above.  No orders of the defined types were placed in this encounter.  No orders of the defined types were placed in this encounter.  He was referred by the oncology group at [facility] to call our office as he had chest pain last evening.  History of Present Illness:    Gary [name] is a 79 y.o. male with a hx of cardiomyopathy most recent ejection fraction low normal 50 to 55% and hypertension last seen 06/15/2020.  He had a myocardial perfusion study performed October 2020 that showed ejection fraction of 50% and a fixed defect anterior septal wall felt to be due to soft tissue attenuation but there is no description of segmental left ventricular function.  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  He had partial resection of the liver and lymph node dissection.  At [facility] 01/01/2018.  An echocardiogram at that time showed an ejection fraction of 45 to 50% with elevated filling pressure.  Records from [place] [facility] echocardiogram 06/15/2020 shows normal left ventricular systolic function and filling pressure mild concentric LVH and mild left atrial enlargement ejection fraction visually estimated 50% low normal.  CT abdomen 12/08/2019 shows finding status post cholecystectomy and mild to moderately severe calcification of the aorta.  MRI of the abdomen 04/13/2020 showed soft tissue nodule raising concern for  metastatic cancer.  Compliance with diet, lifestyle and medications: *** No past medical history on file.  *** The histories are not reviewed yet. Please review them in the "History" navigator section and refresh this [place].  Current Medications: No outpatient medications have been marked as taking for the 07/08/20 encounter (Appointment) with Richardo Priest, MD.     Allergies:   Pork-derived products and Amoxicillin   Social History   Socioeconomic History  . Marital status: Widowed    Spouse name: Not on file  . Number of children: Not on file  . Years of education: Not on file  . Highest education level: Not on file  Occupational History  . Not on file  Tobacco Use  . Smoking status: Never Smoker  . Smokeless tobacco: Current User    Types: Chew  Substance and Sexual Activity  . Alcohol use: Not Currently  . Drug use: Not on file  . Sexual activity: Not on file  Other Topics Concern  . Not on file  Social History Narrative  . Not on file   Social Determinants of Health   Financial Resource Strain:   . Difficulty of Paying Living Expenses: Not on file  Food Insecurity:   . Worried About Charity fundraiser in the Last Year: Not on file  . Ran Out of Food in the Last Year: Not on file  Transportation Needs:   . Lack of Transportation (Medical): Not on file  . Lack of Transportation (Non-Medical): Not on file  Physical Activity:   . Days of Exercise per  Week: Not on file  . Minutes of Exercise per Session: Not on file  Stress:   . Feeling of Stress : Not on file  Social Connections:   . Frequency of Communication with Friends and Family: Not on file  . Frequency of Social Gatherings with Friends and Family: Not on file  . Attends Religious Services: Not on file  . Active Member of Clubs or Organizations: Not on file  . Attends Archivist Meetings: Not on file  . Marital Status: Not on file     Family History: The patient's ***family history  is not on file. ROS:   Please see the history of present illness.    All other systems reviewed and are negative.  EKGs/Labs/Other Studies Reviewed:    The following studies were reviewed today:  EKG:  EKG ordered today and personally reviewed.  The ekg ordered today demonstrates ***  Recent Labs: 09/09/2019: BUN 15; Creatinine, Ser 1.02; Potassium 5.0; Sodium 143  Recent Lipid Panel No results found for: CHOL, TRIG, HDL, CHOLHDL, VLDL, LDLCALC, LDLDIRECT  Physical Exam:    VS:  There were no vitals taken for this visit.    Wt Readings from Last 3 Encounters:  06/15/20 (!) 303 lb (137.4 kg)  09/09/19 297 lb (134.7 kg)  07/14/19 298 lb (135.2 kg)     GEN: *** Well nourished, well developed in no acute distress HEENT: Normal NECK: No JVD; No carotid bruits LYMPHATICS: No lymphadenopathy CARDIAC: ***RRR, no murmurs, rubs, gallops RESPIRATORY:  Clear to auscultation without rales, wheezing or rhonchi  ABDOMEN: Soft, non-tender, non-distended MUSCULOSKELETAL:  No edema; No deformity  SKIN: Warm and dry NEUROLOGIC:  Alert and oriented x 3 PSYCHIATRIC:  Normal affect    Signed, Shirlee More, MD  07/08/2020 10:50 AM    [place]

## 2020-07-08 NOTE — ED Triage Notes (Signed)
Patient reports intermittent left chest pain radiating to left arm onset last week with mild SOB , no emesis or diaphoresis , denies cough or fever .

## 2020-07-08 NOTE — Patient Instructions (Signed)
Medication Instructions:  Your physician has recommended you make the following change in your medication:   1. START: furosemide (lasix) 20 mg once a day  2. START: isosorbide mononitrate (imdur) 30 mg tablet once a day  3. START: Aspirin 81 mg once a day  4. START: Sublingual Nitrolgycerin 0.4 mg tablet: Use as directed AS NEEDED for chest pain  *If you need a refill on your cardiac medications before your next appointment, please call your pharmacy*   Lab Work: TODAY STAT: D-DIMER, TROPONIN, PRO-BNP  TODAY: BMET  If you have labs (blood work) drawn today and your tests are completely normal, you will receive your results only by: Marland Kitchen MyChart Message (if you have MyChart) OR . A paper copy in the mail If you have any lab test that is abnormal or we need to change your treatment, we will call you to review the results.   Testing/Procedures: Your physician recommends that you have a Chest CT to rule out Pulmonary Embolism   Follow-Up: Monday or Tuesday with Dr. Agustin Cree   Other Instructions None

## 2020-07-08 NOTE — Telephone Encounter (Signed)
Spoke with Gary [name]/ Gary [name] at the [facility] in [place] and the pt was c/o left sided chest pain for 2 weeks with exertion and rest and he had pain last night lasting 20 min that radiated to his left shoulder.. it was relieved with one nitro.. pt has not had pain today according to what he has told them while being seen this morning and advised he can see Dr. Bettina Gavia the DOD today at 1 pm.

## 2020-07-08 NOTE — Telephone Encounter (Signed)
New message:  Tammy from [facility] calling stating that this patient has has chest pain for two weeks. Please call patient need to be seen ASAP

## 2020-07-09 ENCOUNTER — Encounter (HOSPITAL_COMMUNITY): Payer: Self-pay | Admitting: Family Medicine

## 2020-07-09 DIAGNOSIS — I2510 Atherosclerotic heart disease of native coronary artery without angina pectoris: Secondary | ICD-10-CM | POA: Diagnosis present

## 2020-07-09 DIAGNOSIS — I10 Essential (primary) hypertension: Secondary | ICD-10-CM | POA: Diagnosis not present

## 2020-07-09 DIAGNOSIS — R079 Chest pain, unspecified: Secondary | ICD-10-CM | POA: Diagnosis not present

## 2020-07-09 DIAGNOSIS — I5032 Chronic diastolic (congestive) heart failure: Secondary | ICD-10-CM | POA: Diagnosis present

## 2020-07-09 DIAGNOSIS — I5031 Acute diastolic (congestive) heart failure: Secondary | ICD-10-CM | POA: Diagnosis not present

## 2020-07-09 DIAGNOSIS — I420 Dilated cardiomyopathy: Secondary | ICD-10-CM | POA: Diagnosis present

## 2020-07-09 DIAGNOSIS — I214 Non-ST elevation (NSTEMI) myocardial infarction: Principal | ICD-10-CM

## 2020-07-09 DIAGNOSIS — I5042 Chronic combined systolic (congestive) and diastolic (congestive) heart failure: Secondary | ICD-10-CM | POA: Diagnosis not present

## 2020-07-09 DIAGNOSIS — Z7982 Long term (current) use of aspirin: Secondary | ICD-10-CM | POA: Diagnosis not present

## 2020-07-09 DIAGNOSIS — R519 Headache, unspecified: Secondary | ICD-10-CM | POA: Diagnosis not present

## 2020-07-09 DIAGNOSIS — Z87892 Personal history of anaphylaxis: Secondary | ICD-10-CM | POA: Diagnosis not present

## 2020-07-09 DIAGNOSIS — Z20822 Contact with and (suspected) exposure to covid-19: Secondary | ICD-10-CM | POA: Diagnosis present

## 2020-07-09 DIAGNOSIS — N400 Enlarged prostate without lower urinary tract symptoms: Secondary | ICD-10-CM | POA: Diagnosis present

## 2020-07-09 DIAGNOSIS — N179 Acute kidney failure, unspecified: Secondary | ICD-10-CM

## 2020-07-09 DIAGNOSIS — Z87891 Personal history of nicotine dependence: Secondary | ICD-10-CM | POA: Diagnosis not present

## 2020-07-09 DIAGNOSIS — M1990 Unspecified osteoarthritis, unspecified site: Secondary | ICD-10-CM | POA: Diagnosis present

## 2020-07-09 DIAGNOSIS — E669 Obesity, unspecified: Secondary | ICD-10-CM | POA: Diagnosis not present

## 2020-07-09 DIAGNOSIS — I77819 Aortic ectasia, unspecified site: Secondary | ICD-10-CM | POA: Diagnosis present

## 2020-07-09 DIAGNOSIS — M545 Low back pain, unspecified: Secondary | ICD-10-CM | POA: Diagnosis present

## 2020-07-09 DIAGNOSIS — Z6837 Body mass index (BMI) 37.0-37.9, adult: Secondary | ICD-10-CM | POA: Diagnosis not present

## 2020-07-09 DIAGNOSIS — K219 Gastro-esophageal reflux disease without esophagitis: Secondary | ICD-10-CM | POA: Diagnosis present

## 2020-07-09 DIAGNOSIS — F419 Anxiety disorder, unspecified: Secondary | ICD-10-CM | POA: Diagnosis present

## 2020-07-09 DIAGNOSIS — Z880 Allergy status to penicillin: Secondary | ICD-10-CM | POA: Diagnosis not present

## 2020-07-09 DIAGNOSIS — G8929 Other chronic pain: Secondary | ICD-10-CM | POA: Diagnosis present

## 2020-07-09 DIAGNOSIS — I110 Hypertensive heart disease with heart failure: Secondary | ICD-10-CM | POA: Diagnosis present

## 2020-07-09 DIAGNOSIS — I951 Orthostatic hypotension: Secondary | ICD-10-CM | POA: Diagnosis not present

## 2020-07-09 DIAGNOSIS — Z720 Tobacco use: Secondary | ICD-10-CM | POA: Diagnosis not present

## 2020-07-09 DIAGNOSIS — Z79899 Other long term (current) drug therapy: Secondary | ICD-10-CM | POA: Diagnosis not present

## 2020-07-09 HISTORY — DX: Acute kidney failure, unspecified: N17.9

## 2020-07-09 HISTORY — DX: Non-ST elevation (NSTEMI) myocardial infarction: I21.4

## 2020-07-09 LAB — BASIC METABOLIC PANEL
Anion gap: 12 (ref 5–15)
BUN/Creatinine Ratio: 14 (ref 10–24)
BUN: 17 mg/dL (ref 8–27)
BUN: 18 mg/dL (ref 8–23)
CO2: 24 mmol/L (ref 20–29)
CO2: 21 mmol/L — ABNORMAL LOW (ref 22–32)
Calcium: 9.2 mg/dL (ref 8.6–10.2)
Calcium: 8.7 mg/dL — ABNORMAL LOW (ref 8.9–10.3)
Chloride: 104 mmol/L (ref 96–106)
Chloride: 107 mmol/L (ref 98–111)
Creatinine, Ser: 1.23 mg/dL (ref 0.76–1.27)
Creatinine, Ser: 1.29 mg/dL — ABNORMAL HIGH (ref 0.61–1.24)
GFR calc Af Amer: 65 mL/min/{1.73_m2} (ref >59)
GFR calc non Af Amer: 56 mL/min/{1.73_m2} — ABNORMAL LOW (ref >59)
GFR, Estimated: 53 mL/min — ABNORMAL LOW (ref >60)
Glucose, Bld: 123 mg/dL — ABNORMAL HIGH (ref 70–99)
Glucose: 159 mg/dL — ABNORMAL HIGH (ref 65–99)
Potassium: 4.8 mmol/L (ref 3.5–5.2)
Potassium: 4.2 mmol/L (ref 3.5–5.1)
Sodium: 139 mmol/L (ref 134–144)
Sodium: 140 mmol/L (ref 135–145)

## 2020-07-09 LAB — CBC
HCT: 41.8 % (ref 39.0–52.0)
Hemoglobin: 12.9 g/dL — ABNORMAL LOW (ref 13.0–17.0)
MCH: 29.7 pg (ref 26.0–34.0)
MCHC: 30.9 g/dL (ref 30.0–36.0)
MCV: 96.1 fL (ref 80.0–100.0)
Platelets: 144 10*3/uL — ABNORMAL LOW (ref 150–400)
RBC: 4.35 MIL/uL (ref 4.22–5.81)
RDW: 13.6 % (ref 11.5–15.5)
WBC: 7.1 10*3/uL (ref 4.0–10.5)
nRBC: 0 % (ref 0.0–0.2)

## 2020-07-09 LAB — TROPONIN T: Troponin T (Highly Sensitive): 176 ng/L (ref 0–22)

## 2020-07-09 LAB — TROPONIN I (HIGH SENSITIVITY)
Troponin I (High Sensitivity): 187 ng/L (ref <18)
Troponin I (High Sensitivity): 172 ng/L (ref <18)
Troponin I (High Sensitivity): 157 ng/L (ref <18)

## 2020-07-09 LAB — RESPIRATORY PANEL BY RT PCR (FLU A&B, COVID)
Influenza A by PCR: NEGATIVE
Influenza B by PCR: NEGATIVE
SARS Coronavirus 2 by RT PCR: NEGATIVE

## 2020-07-09 LAB — LIPID PANEL
Cholesterol: 139 mg/dL (ref 0–200)
HDL: 33 mg/dL — ABNORMAL LOW (ref >40)
LDL Cholesterol: 55 mg/dL (ref 0–99)
Total CHOL/HDL Ratio: 4.2 RATIO
Triglycerides: 255 mg/dL — ABNORMAL HIGH (ref <150)
VLDL: 51 mg/dL — ABNORMAL HIGH (ref 0–40)

## 2020-07-09 LAB — PRO B NATRIURETIC PEPTIDE: NT-Pro BNP: 164 pg/mL (ref 0–486)

## 2020-07-09 LAB — APTT
aPTT: 59 seconds — ABNORMAL HIGH (ref 24–36)
aPTT: 60 seconds — ABNORMAL HIGH (ref 24–36)

## 2020-07-09 LAB — D-DIMER, QUANTITATIVE: D-DIMER: 1.65 mg/L FEU — ABNORMAL HIGH (ref 0.00–0.49)

## 2020-07-09 MED ORDER — BUPROPION HCL ER (XL) 150 MG PO TB24
300.0000 mg | ORAL_TABLET | Freq: Every day | ORAL | Status: DC
  Administered 2020-07-09 – 2020-07-13 (×5): 300 mg via ORAL
  Filled 2020-07-09: qty 1
  Filled 2020-07-09 (×3): qty 2
  Filled 2020-07-09: qty 1

## 2020-07-09 MED ORDER — ASPIRIN EC 81 MG PO TBEC: 81.0000 mg | DELAYED_RELEASE_TABLET | Freq: Every day | ORAL | Status: DC

## 2020-07-09 MED ORDER — NITROGLYCERIN 0.4 MG SL SUBL: 0.4000 mg | SUBLINGUAL_TABLET | SUBLINGUAL | Status: DC | PRN

## 2020-07-09 MED ORDER — ADULT MULTIVITAMIN W/MINERALS CH
1.0000 | ORAL_TABLET | Freq: Every day | ORAL | Status: DC
  Administered 2020-07-10 – 2020-07-13 (×4): 1 via ORAL
  Filled 2020-07-09 (×4): qty 1

## 2020-07-09 MED ORDER — ACETAMINOPHEN 325 MG PO TABS
650.0000 mg | ORAL_TABLET | ORAL | Status: DC | PRN
  Administered 2020-07-09 – 2020-07-11 (×3): 650 mg via ORAL
  Filled 2020-07-09 (×3): qty 2

## 2020-07-09 MED ORDER — ISOSORBIDE MONONITRATE ER 30 MG PO TB24
30.0000 mg | ORAL_TABLET | Freq: Every day | ORAL | Status: DC
  Administered 2020-07-09 – 2020-07-13 (×5): 30 mg via ORAL
  Filled 2020-07-09 (×5): qty 1

## 2020-07-09 MED ORDER — HYDROCODONE-ACETAMINOPHEN 5-325 MG PO TABS
1.0000 | ORAL_TABLET | Freq: Four times a day (QID) | ORAL | Status: DC | PRN
Start: 2020-07-09 — End: 2020-07-09

## 2020-07-09 MED ORDER — ASPIRIN EC 81 MG PO TBEC
81.0000 mg | DELAYED_RELEASE_TABLET | Freq: Every day | ORAL | Status: DC
  Administered 2020-07-09 – 2020-07-13 (×5): 81 mg via ORAL
  Filled 2020-07-09 (×5): qty 1

## 2020-07-09 MED ORDER — LACTATED RINGERS IV SOLN: INTRAVENOUS | Status: DC

## 2020-07-09 MED ORDER — FUROSEMIDE 20 MG PO TABS
20.0000 mg | ORAL_TABLET | Freq: Every day | ORAL | Status: DC
  Administered 2020-07-09 – 2020-07-12 (×3): 20 mg via ORAL
  Filled 2020-07-09 (×3): qty 1

## 2020-07-09 MED ORDER — HYDROCODONE-ACETAMINOPHEN 10-325 MG PO TABS
1.0000 | ORAL_TABLET | Freq: Four times a day (QID) | ORAL | Status: DC | PRN
  Administered 2020-07-09 – 2020-07-13 (×12): 1 via ORAL
  Filled 2020-07-09 (×12): qty 1

## 2020-07-09 MED ORDER — PANTOPRAZOLE SODIUM 40 MG PO TBEC
40.0000 mg | DELAYED_RELEASE_TABLET | Freq: Every day | ORAL | Status: DC
  Administered 2020-07-10 – 2020-07-13 (×4): 40 mg via ORAL
  Filled 2020-07-09 (×4): qty 1

## 2020-07-09 MED ORDER — TAMSULOSIN HCL 0.4 MG PO CAPS
0.4000 mg | ORAL_CAPSULE | Freq: Every day | ORAL | Status: DC
  Administered 2020-07-09 – 2020-07-13 (×5): 0.4 mg via ORAL
  Filled 2020-07-09 (×5): qty 1

## 2020-07-09 MED ORDER — ALPRAZOLAM 0.5 MG PO TABS
0.5000 mg | ORAL_TABLET | Freq: Three times a day (TID) | ORAL | Status: DC | PRN
  Administered 2020-07-09 – 2020-07-11 (×3): 0.5 mg via ORAL
  Filled 2020-07-09: qty 2
  Filled 2020-07-09 (×2): qty 1

## 2020-07-09 MED ORDER — ASPIRIN 81 MG PO CHEW
324.0000 mg | CHEWABLE_TABLET | Freq: Once | ORAL | Status: AC
  Administered 2020-07-09: 324 mg via ORAL
  Filled 2020-07-09: qty 4

## 2020-07-09 MED ORDER — SACUBITRIL-VALSARTAN 24-26 MG PO TABS
1.0000 | ORAL_TABLET | Freq: Two times a day (BID) | ORAL | Status: DC
  Administered 2020-07-09 – 2020-07-12 (×8): 1 via ORAL
  Filled 2020-07-09 (×9): qty 1

## 2020-07-09 MED ORDER — CARVEDILOL 3.125 MG PO TABS
3.1250 mg | ORAL_TABLET | Freq: Two times a day (BID) | ORAL | Status: DC
  Administered 2020-07-09 – 2020-07-12 (×8): 3.125 mg via ORAL
  Filled 2020-07-09 (×8): qty 1

## 2020-07-09 MED ORDER — VITAMIN D 25 MCG (1000 UNIT) PO TABS
1000.0000 [IU] | ORAL_TABLET | Freq: Every day | ORAL | Status: DC
  Administered 2020-07-10 – 2020-07-13 (×4): 1000 [IU] via ORAL
  Filled 2020-07-09 (×4): qty 1

## 2020-07-09 MED ORDER — SODIUM CHLORIDE 0.9 % IV SOLN
0.1500 mg/kg/h | INTRAVENOUS | Status: DC
  Administered 2020-07-09 – 2020-07-11 (×4): 0.15 mg/kg/h via INTRAVENOUS
  Filled 2020-07-09 (×7): qty 250

## 2020-07-09 MED ORDER — ONDANSETRON HCL 4 MG/2ML IJ SOLN: 4.0000 mg | Freq: Four times a day (QID) | INTRAMUSCULAR | Status: DC | PRN

## 2020-07-09 NOTE — H&P (Signed)
History and Physical    Gary [name] UKG:[id number] DOB: 01/13/1941 DOA: 07/08/2020  PCP: Bonnita Nasuti, MD   Patient coming from: Home  Chief Complaint: Chest pain   HPI: Gary [name] is a 79 y.o. male with medical history significant for diastolic CHF,  Hypertension, DDD lower back, OA, BPH who presents for evaluation of chest pain. He states he has had 3-4 episodes of chest pain over the past few days. The patient's chest pain is described as heaviness/pressure/tightness and is not worse with exertion. The patient's chest pain is middle- or left-sided and does radiate to the left shoulder and upper arm.  The patient does not complain of nausea and denies diaphoresis. The patient has no history of stroke, has no history of peripheral artery disease, denies any history of diabetes, has no relevant family history of coronary artery disease. He has chronic SOB with exertion that is unchanged from baseline level. 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  He has a history of smoking but he states he quit 1990.  Still chews tobacco.  He denies alcohol use or illicit drug use.  ED Course: Patient has EKG that does not show acute ST changes.  He does have elevated troponin level.  Patient was discussed by the ER physician with cardiology who will see patient in consultation in the morning  Review of Systems:  General: Denies weakness, fever, chills, weight loss, night sweats.  Denies dizziness.  Denies change in appetite HENT: Denies head trauma, headache, denies change in hearing, tinnitus.  Denies nasal congestion or bleeding.  Denies sore throat, sores in mouth.   Eyes: Denies blurry vision, pain in eye, drainage.  Denies discoloration of eyes. Neck: Denies pain.  Denies swelling.  Denies pain with movement. Cardiovascular: Reports chest pain, reports intermittent palpitations.  Has chronic edema.  Denies orthopnea Respiratory: Reports chronic  shortness of breath with exertion that is unchanged. Denies cough.  Denies wheezing.  Denies sputum production Gastrointestinal: Denies abdominal pain, swelling.  Denies nausea, vomiting, diarrhea.  Denies melena.  Denies hematemesis. Musculoskeletal: Has chronic lower back pain. Denies limitation of movement.  Denies deformity or swelling.  Denies arthralgias or myalgias. Genitourinary: Denies pelvic pain.  Denies urinary frequency or hesitancy.  Denies dysuria.  Skin: Denies rash.  Denies petechiae, purpura, ecchymosis. Neurological: Denies headache.  Denies syncope.  Denies seizure activity. Denies slurred speech, drooping face.  Denies visual change. Psychiatric: Denies depression, anxiety.  Denies suicidal thoughts or ideation.  Denies hallucinations.  Past Medical History:  Diagnosis Date   Anxiety    Arthritis    CHF (congestive heart failure) ([place])    Hypertension     History reviewed. No pertinent surgical history.  Social History  reports that he has never smoked. His smokeless tobacco use includes chew. He reports previous alcohol use. He reports that he does not use drugs.  Allergies  Allergen Reactions   Pork-Derived Products Anaphylaxis   Amoxicillin Rash    History reviewed. No pertinent family history.   Prior to Admission medications   Medication Sig Start Date End Date Taking? Authorizing Provider  ALPRAZolam Duanne Moron) 0.5 MG tablet Take 0.5 mg by mouth 3 (three) times daily as needed. 04/09/19   [provider]  aspirin EC 81 MG tablet Take 1 tablet (81 mg total) by mouth daily. Swallow whole. 07/08/20   Richardo Priest, MD  buPROPion (WELLBUTRIN XL) 300 MG 24 hr tablet 300 mg in the morning and at  bedtime. 04/05/20   [provider]  carvedilol (COREG) 3.125 MG tablet Take 3.125 mg by mouth 2 (two) times daily. 04/07/19   [provider]  cholecalciferol (VITAMIN D3) 25 MCG (1000 UNIT) tablet Take 1,000 Units by mouth daily.     [provider]  ENTRESTO 24-26 MG 1 tablet daily. 04/04/20   [provider]  furosemide (LASIX) 20 MG tablet Take 1 tablet (20 mg total) by mouth daily. 07/08/20   Richardo Priest, MD  HYDROcodone-acetaminophen (NORCO) 10-325 MG tablet Take 1 tablet by mouth 3 (three) times daily as needed. 04/08/19   [provider]  isosorbide mononitrate (IMDUR) 30 MG 24 hr tablet Take 1 tablet (30 mg total) by mouth daily. 07/08/20   Richardo Priest, MD  ketoconazole (NIZORAL) 2 % cream  06/11/20   [provider]  montelukast (SINGULAIR) 10 MG tablet 10 mg daily. 04/09/20   [provider]  Multiple Vitamin (MULTIVITAMIN) tablet Take 1 tablet by mouth daily.    [provider]  nitroGLYCERIN (NITROSTAT) 0.4 MG SL tablet Place 1 tablet (0.4 mg total) under the tongue every 5 (five) minutes as needed for chest pain. 07/08/20   Richardo Priest, MD  Omega-3 Fatty Acids (FISH OIL) 1000 MG CAPS Take by mouth daily.    [provider]  omeprazole (PRILOSEC) 40 MG capsule Take 1 capsule by mouth daily. 04/07/19   [provider]  ondansetron (ZOFRAN) 4 MG tablet Take 1 tablet by mouth 2 (two) times daily as needed. 12/20/17   [provider]  tamsulosin (FLOMAX) 0.4 MG CAPS capsule 0.4 mg daily. 06/11/20   [provider]  vitamin B-12 (CYANOCOBALAMIN) 250 MCG tablet Take 250 mcg by mouth daily.    [provider]    Physical Exam: Vitals:   07/08/20 2134 07/08/20 2313 07/08/20 2352 07/09/20 0052  BP: 139/90 128/70 (!) 116/53 104/66  Pulse: 97 73 65 82  Resp: 18 20 12 19   Temp: 98.4 F (36.9 C) 98.7 F (37.1 C)    TempSrc: Oral Oral    SpO2: 99% 94% 95% 97%  Weight:      Height:        Constitutional: NAD, calm, comfortable Vitals:   07/08/20 2134 07/08/20 2313 07/08/20 2352 07/09/20 0052  BP: 139/90 128/70 (!) 116/53 104/66  Pulse: 97 73 65 82  Resp: 18 20 12 19   Temp: 98.4 F (36.9 C) 98.7 F (37.1 C)     TempSrc: Oral Oral    SpO2: 99% 94% 95% 97%  Weight:      Height:       General: WDWN, Alert and oriented x3.  Eyes: EOMI, PERRL, lids and conjunctivae normal.  Sclera nonicteric HENT:  [place]/AT, external ears normal.  Nares patent without epistasis.  Mucous membranes are moist. Posterior pharynx clear of any exudate or lesions.  Neck: Soft, normal range of motion, supple, no masses, no thyromegaly.  Trachea midline Respiratory: clear to auscultation bilaterally, no wheezing, no crackles. Normal respiratory effort. No accessory muscle use.  Cardiovascular: Regular rate and rhythm, no murmurs / rubs / gallops. Trace lower extremity edema. 2+ pedal pulses.  Abdomen: Soft, no tenderness, nondistended, Obese.  no rebound or guarding.  No masses palpated. Bowel sounds normoactive Musculoskeletal: FROM. no clubbing / cyanosis. No joint deformity upper and lower extremities. Normal muscle tone.  Skin: Warm, dry, intact no rashes, lesions, ulcers. No induration Neurologic: CN 2-12 grossly intact.  Normal speech.  Sensation intact,  patella DTR +1 bilaterally. Strength 5/5 in all extremities.   Psychiatric: Normal judgment and insight.  Normal mood.    Labs on Admission: I have personally reviewed following labs and imaging studies  CBC: Recent Labs  Lab 07/08/20 2125  WBC 6.8  HGB 13.3  HCT 43.0  MCV 94.7  PLT 443    Basic Metabolic Panel: Recent Labs  Lab 07/08/20 2125  NA 141  K 4.6  CL 107  CO2 25  GLUCOSE 138*  BUN 17  CREATININE 1.38*  CALCIUM 9.0    GFR: Estimated Creatinine Clearance: 69.3 mL/min (A) (by C-G formula based on SCr of 1.38 mg/dL (H)).  Liver Function Tests: No results for input(s): AST, ALT, ALKPHOS, BILITOT, PROT, ALBUMIN in the last 168 hours.  Urine analysis: No results found for: COLORURINE, APPEARANCEUR, LABSPEC, PHURINE, GLUCOSEU, HGBUR, BILIRUBINUR, KETONESUR, PROTEINUR, UROBILINOGEN, NITRITE, LEUKOCYTESUR  Radiological Exams on Admission: DG  Chest 2 View  Result Date: 07/08/2020 CLINICAL DATA:  Chest pain EXAM: CHEST - 2 VIEW COMPARISON:  CTA from earlier in the same day. FINDINGS: Cardiac shadow is within normal limits. Lungs are well aerated bilaterally. No focal infiltrate or sizable effusion is seen. No bony abnormality is noted. IMPRESSION: No acute abnormality seen. Electronically Signed   By: Inez Catalina M.D.   On: 07/08/2020 22:12   CT ANGIO CHEST PE W OR WO CONTRAST  Result Date: 07/08/2020 CLINICAL DATA:  Chest pain and shortness of breath EXAM: CT ANGIOGRAPHY CHEST WITH CONTRAST TECHNIQUE: Multidetector CT imaging of the chest was performed using the standard protocol during bolus administration of intravenous contrast. Multiplanar CT image reconstructions and MIPs were obtained to evaluate the vascular anatomy. CONTRAST:  23mL OMNIPAQUE IOHEXOL 350 MG/ML SOLN COMPARISON:  05/09/2020 PET-CT FINDINGS: Cardiovascular: Thoracic aorta demonstrates atherosclerotic calcifications without aneurysmal dilatation or dissection. No cardiac enlargement is seen. The coronary arteries demonstrate diffuse calcifications. The pulmonary artery shows a normal branching pattern without definitive filling defect to suggest pulmonary embolism. Mediastinum/Nodes: Thoracic inlet is within normal limits. No sizable hilar or mediastinal adenopathy is noted. The esophagus as visualized is normal limits. Lungs/Pleura: The lungs are well aerated bilaterally. No focal infiltrate or sizable effusion is seen. No pneumothorax or sizable parenchymal nodule is noted. Some stable perivascular nodularity is noted unchanged from 2017 consistent with a benign etiology. Upper Abdomen: Visualized upper abdomen shows changes of prior cholecystectomy. An area of inflammatory changes noted in the subcutaneous tissues in an area of prior hypermetabolic nodule. A small lymph node is noted in the abdomen anterior to the stomach stable in appearance from the prior exam.  Musculoskeletal: Degenerative changes of the thoracic spine are noted. Review of the MIP images confirms the above findings. IMPRESSION: No evidence of pulmonary emboli. Stable nodularity within the right middle lobe unchanged from 2017 consistent with a benign etiology. Previously seen nodularity in the anterior abdominal wall is less prominent with a small fluid collection identified. Correlate with any recent excision in this region. Aortic Atherosclerosis (ICD10-I70.0). Electronically Signed   By: Inez Catalina M.D.   On: 07/08/2020 17:04    EKG: Independently reviewed.  EKG is reviewed and shows normal sinus rhythm with nonspecific ST changes.  No acute ST elevation or depression.  QTc is 375  Assessment/Plan Principal Problem:   NSTEMI (non-ST elevated myocardial infarction) [facility]) Gary [name] is admitted to cardiac telemetry with NSTEMI.  Obtain serial troponin levels.  Cardiology has been consulted by the ER physician and cardiology will see in consultation.  antiplatelet therapy with aspirin daily.  Check lipid panel.  Monitor blood pressure.  Continue home medications of Coreg, Entresto.  Use nitroglycerin as needed for chest pain.  Supplemental oxygen as needed to maintain O2 sat between 92-96%  Active Problems:   Diastolic congestive heart failure (HCC) Chronic.  Continue home medications as listed above.  Continue Lasix daily.    Essential hypertension Monitor blood pressure.  Continue home medication    AKI (acute kidney injury) ([place]) Mild increase in BUN and creatinine.  Can be secondary to decreased perfusion of kidneys with NSTEMI.  Gentle IV fluid hydration with LR at 625 mL's per hour overnight.  Recheck electrolytes renal function morning    DVT prophylaxis: Placed on heparin infusion for ACS per pharmacy dosing Code Status:   Full code Family Communication:  Diagnosis and plan discussed with patient and his son who is at bedside.  Questions answered.  They verbalized  understanding and agree with plan.  Further recommendations to follow as clinical indicated Disposition Plan:   Patient is from:  Home  Anticipated DC to:  Home  Anticipated DC date:  Anticipate at least 2 midnight stay in the hospital  Anticipated DC barriers: No barriers to discharge identified at this time  Consults called:  Cardiology Admission status:  Inpatient  Yevonne Aline Wilhelm Ganaway MD [facility]  How to contact the [facility] Attending or Consulting provider [place] or covering provider during after hours [place], for this patient?   1. Check the care team in [facility] and look for a) attending/consulting [facility] provider listed and b) the [facility] team listed 2. Log into [email or web address] and use [place]'s universal password to access. If you do not have the password, please contact the hospital operator. 3. Locate the [facility] provider you are looking for under [facility] and page to a number that you can be directly reached. 4. If you still have difficulty reaching the provider, please page the [facility] (Director on Call) for the Hospitalists listed on amion for assistance.  07/09/2020, 1:18 AM

## 2020-07-09 NOTE — Progress Notes (Signed)
° °  Patient seen in the ER this morning.  He was seen overnight by our fellow for elevated troponin with concern for NSTEMI.  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  Troponin peaked at 221 but is improving.  He denies any chest pain.  Started on bivalirudin due to heparin allergy.  Will review an echo today and discuss further work-up based on this.  May need stress testing versus catheterization early next week.  Okay for diet today.  Pixie Casino, MD, [facility], [place] Director of the Advanced Lipid Disorders &  Cardiovascular Risk Reduction Clinic Diplomate of the American Board of Clinical Lipidology Attending Cardiologist  Direct Dial: [phone]   Fax: [phone]  Website:  [email or web address]

## 2020-07-09 NOTE — Progress Notes (Signed)
[place] for bivalirudin Indication: chest pain/ACS  Allergies  Allergen Reactions  . Pork-Derived Products Anaphylaxis  . Amoxicillin Rash    Patient Measurements: Height: 6\' 2"  (188 cm) Weight: (!) 150 kg (330 lb 11 oz) IBW/kg (Calculated) : 82.2 Heparin Dosing Weight: 120kg  Vital Signs: BP: 96/74 (10/16 1300) Pulse Rate: 70 (10/16 1315)  Labs: Recent Labs    07/08/20 1411 07/08/20 2125 07/08/20 2125 07/08/20 2317 07/09/20 0354 07/09/20 1038  HGB  --  13.3  --   --  12.9*  --   HCT  --  43.0  --   --  41.8  --   PLT  --  153  --   --  144*  --   APTT  --   --   --   --  59* 60*  LABPROT  --  13.0  --   --   --   --   INR  --  1.0  --   --   --   --   CREATININE 1.23 1.38*  --   --  1.29*  --   TROPONINIHS  --  221*   < > 187* 172* 157*   < > = values in this interval not displayed.    Estimated Creatinine Clearance: 74.1 mL/min (A) (by C-G formula based on SCr of 1.29 mg/dL (H)).   Medical History: Past Medical History:  Diagnosis Date  . Anxiety   . Arthritis   . CHF (congestive heart failure) ([place])   . 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  APTT remains therapeutic at 60 seconds  Goal of Therapy:  aPTT 50-85 seconds Monitor platelets by anticoagulation protocol: Yes   Plan:  Continue bivalirudin at 0.15 mg/kg/hr Monitor CBC, aPTT, Cards plans  Bertis Ruddy, PharmD Clinical Pharmacist ED Pharmacist Phone # [phone] 07/09/2020 2:55 PM

## 2020-07-09 NOTE — ED Notes (Signed)
Admitting at bedside 

## 2020-07-09 NOTE — Progress Notes (Signed)
Pt admitted to [place] from [facility] ED.  Pt A&O X 4 and neuro intact.  Vitals taken and all within normal range.  Pt placed on telemetry and CCMD notified. CHG bath completed.  Pt currently comfortable and not in pain.

## 2020-07-09 NOTE — Progress Notes (Signed)
ANTICOAGULATION CONSULT NOTE - Initial Consult  Pharmacy Consult for bivalirudin Indication: chest pain/ACS  Allergies  Allergen Reactions  . Pork-Derived Products Anaphylaxis  . Amoxicillin Rash    Patient Measurements: Height: 6\' 2"  (188 cm) Weight: (!) 150 kg (330 lb 11 oz) IBW/kg (Calculated) : 82.2 Heparin Dosing Weight: 120kg  Vital Signs: Temp: 98.7 F (37.1 C) (10/15 2313) Temp Source: Oral (10/15 2313) BP: 117/59 (10/16 0815) Pulse Rate: 63 (10/16 0815)  Labs: Recent Labs    07/08/20 1411 07/08/20 2125 07/08/20 2317 07/09/20 0354  HGB  --  13.3  --  12.9*  HCT  --  43.0  --  41.8  PLT  --  153  --  144*  APTT  --   --   --  59*  LABPROT  --  13.0  --   --   INR  --  1.0  --   --   CREATININE 1.23 1.38*  --  1.29*  TROPONINIHS  --  221* 187* 172*    Estimated Creatinine Clearance: 74.1 mL/min (A) (by C-G formula based on SCr of 1.29 mg/dL (H)).   Medical History: Past Medical History:  Diagnosis Date  . Anxiety   . Arthritis   . CHF (congestive heart failure) ([place])   . 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  APTT - 59 secs is therapeutic  Goal of Therapy:  aPTT 50-85 seconds Monitor platelets by anticoagulation protocol: Yes   Plan:  Continue bivalirudin at 0.15 mg/kg/hr Monitor CBC and PTT.  Alanda Slim, PharmD, [facility] Clinical Pharmacist Please see AMION for all Pharmacists' Contact Phone Numbers 07/09/2020, 8:21 AM

## 2020-07-09 NOTE — ED Notes (Signed)
C/o back pain usually has regular medicines   He reportrs that he needs some

## 2020-07-09 NOTE — ED Notes (Signed)
Pt resting comfortably in bed, nadn, vss on ccm, angiomax infusing without difficulty. pT SIDE RAILS UP, CALL BELL IN REACH.

## 2020-07-09 NOTE — ED Notes (Signed)
Attempt  To call report rn to call me back

## 2020-07-09 NOTE — Progress Notes (Signed)
The patient is a 79 yr old man who presented to [facility] on 07/08/2020 with complaints of chest pain. He carries a past medical history significant for diastolic CHF, hypertension, DDD lower back, osteoarthritis, and BPH. He states that he has had 3-4 episodes of chest pain over the past few days. It is described as aa heaviness/pressure/tightness and is not exertional. It is located in the sternally to left precordial with radiation to left shoulder and upper arm. It is not associated with nausea or diaphoresis. He does have chronic DOE that is unchanged from baseline. 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 He does not have PAD, DM, or family history of CAD.  In the ED the patient was found to have elevated troponins without EKG changes.   [facility] were consulted to admit the patient for further evaluation and treatment. Cardiology was consulted. The patient has been admitted to a telemetry bed by my colleague earlier this morning.   His vitals were stable this morning, although he has had some hypotension this afternoon. He is saturating in the 90's on room air.  The patient is resting comfortably. He is awake, alert, and oriented x 3. No acute distress. Heart and lung sounds are within normal limits. Abdomen is soft, non-tender, non-distended. Extremities are positive for 2-3+ pitting edema bilaterally.

## 2020-07-09 NOTE — ED Notes (Signed)
Report given to rn dave on 4700

## 2020-07-09 NOTE — Progress Notes (Addendum)
ANTICOAGULATION CONSULT NOTE - Initial Consult  Pharmacy Consult for bivalirudin Indication: chest pain/ACS  Allergies  Allergen Reactions  . Pork-Derived Products Anaphylaxis  . Amoxicillin Rash    Patient Measurements: Height: 6\' 2"  (188 cm) Weight: (!) 150 kg (330 lb 11 oz) IBW/kg (Calculated) : 82.2 Heparin Dosing Weight: 120kg  Vital Signs: Temp: 98.7 F (37.1 C) (10/15 2313) Temp Source: Oral (10/15 2313) BP: 104/66 (10/16 0052) Pulse Rate: 82 (10/16 0052)  Labs: Recent Labs    07/08/20 2125 07/08/20 2317  HGB 13.3  --   HCT 43.0  --   PLT 153  --   LABPROT 13.0  --   INR 1.0  --   CREATININE 1.38*  --   TROPONINIHS 221* 187*    Estimated Creatinine Clearance: 69.3 mL/min (A) (by C-G formula based on SCr of 1.38 mg/dL (H)).   Medical History: Past Medical History:  Diagnosis Date  . Anxiety   . Arthritis   . CHF (congestive heart failure) ([place])   . 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  Goal of Therapy:  aPTT 50-85 seconds Monitor platelets by anticoagulation protocol: Yes   Plan:  Will start bivalirudin at 0.15 mg/kg/hr and monitor CBC and PTT.  Wynona Neat, PharmD, BCPS  07/09/2020,1:21 AM

## 2020-07-09 NOTE — Consult Note (Addendum)
CONSULTATION NOTE   Patient Name: Gary [name] Date of Encounter: 07/09/2020 Cardiologist: Jenne Campus, MD  Chief Complaint   Chest pain  Impression   1. Principal Problem: 2.   NSTEMI (non-ST elevated myocardial infarction) ([place]) 3. Active Problems: 4.   Diastolic congestive heart failure ([place]) 5.   Essential hypertension 6.   AKI (acute kidney injury) ([place]) 7. Trop peaked to 221--> 187 ( ekg non ischemic) 8. 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 He is also on home lasisx 20mg  daily (appears euvolemic on exam)  - given NSTEMI- he needs anticoagulation (allergy to heparin products) thus hospitalist started on angiomax (bivaluridin). Continue for 48 hours - we need to discuss regarding LHC/CA given he has had cholangioca s/p recent resection- he is at high risk of bleeding if PCI is done  - if he has chest pain- get EKG, trop. Will need records of his cholangiocarcinoma status, recent surgery to make further decisions about LHC. Gary Luiz Ochoa- is the oncologist and Gary Lilia Pro is the surgical oncologist in [place] - obtain ECHO in am  Patient Profile   Gary [name] is a 79 y.o. male with medical history significant for diastolic CHF,  Hypertension, DDD lower back, OA, BPH who presents for evaluation of chest pain.  HPI   Gary [name] is a 79 y.o. male who is being seen today for the evaluation of nstemi, chest pain at the request of No ref. provider found. Gary [name] is a 79 y.o. male with medical history significant for diastolic CHF (BM84%),  Hypertension, DDD lower back, OA, BPH who presents for  evaluation of chest pain.  Per patient he had chest pain for a few days now- center to the left side, heaviness/chest tightness, non exertional, not associated with additional symptoms. He has chronic SOB with exertion that is unchanged from baseline level. He was given aspirin and nitro with relief in his chest pain. [facility] for further work up and possible LHC/CA.  He had surgery three weeks ago in [place] for cholangiocarcinoma.  He has a history of smoking but he states he quit 1990.  Still chews tobacco.  He denies alcohol use or illicit drug use. Do not have any details of the surgery. ER workup: cxr- neg, CT PE negative  EKG: NSR, non ischemic Trop 221/187  Prior work up:  ECHO: 05/27/19 1. The left ventricle has low normal systolic function, with an ejection  fraction of 50-55%. The cavity size was normal. There is mildly increased  left ventricular wall thickness. Left ventricular diastolic function could  not be evaluated.  2. The mitral valve is grossly normal.  3. The tricuspid valve is grossly normal.  4. The aortic valve is grossly normal. Aortic valve regurgitation was not  assessed by color flow Doppler.  5. The aorta is abnormal unless otherwise noted.  6. There is mild dilatation of the ascending aorta measuring 41 mm.   NM stress test: 07/15/19 Myocardial perfusion is normal.   The study is normal.   This is a low risk study.  Overall left ventricular systolic function was normal.    LV cavity size  is normal.  Nuclear stress EF:  50%.  The left ventricular ejection fraction is mildly decreased (45-54%).    ECG    - Personally Reviewed  Telemetry    - Personally Reviewed  Radiology   DG Chest 2 View  Result Date: 07/08/2020 CLINICAL DATA:  Chest pain EXAM: CHEST - 2 VIEW COMPARISON:  CTA from earlier in the same day. FINDINGS: Cardiac shadow is within normal limits. Lungs are well aerated bilaterally. No focal infiltrate  or sizable effusion is seen. No bony abnormality is noted. IMPRESSION: No acute abnormality seen. Electronically Signed   By: Inez Catalina M.D.   On: 07/08/2020 22:12   CT ANGIO CHEST PE W OR WO CONTRAST  Result Date: 07/08/2020 CLINICAL DATA:  Chest pain and shortness of breath EXAM: CT ANGIOGRAPHY CHEST WITH CONTRAST TECHNIQUE: Multidetector CT imaging of the chest was performed using the standard protocol during bolus administration of intravenous contrast. Multiplanar CT image reconstructions and MIPs were obtained to evaluate the vascular anatomy. CONTRAST:  37mL OMNIPAQUE IOHEXOL 350 MG/ML SOLN COMPARISON:  05/09/2020 PET-CT FINDINGS: Cardiovascular: Thoracic aorta demonstrates atherosclerotic calcifications without aneurysmal dilatation or dissection. No cardiac enlargement is seen. The coronary arteries demonstrate diffuse calcifications. The pulmonary artery shows a normal branching pattern without definitive filling defect to suggest pulmonary embolism. Mediastinum/Nodes: Thoracic inlet is within normal limits. No sizable hilar or mediastinal adenopathy is noted. The esophagus as visualized is normal limits. Lungs/Pleura: The lungs are well aerated bilaterally. No focal infiltrate or sizable effusion is seen. No pneumothorax or sizable parenchymal nodule is noted. Some stable perivascular nodularity is noted unchanged from 2017 consistent with a benign etiology. Upper Abdomen: Visualized upper abdomen shows changes of prior cholecystectomy. An area of inflammatory changes noted in the subcutaneous tissues in an area of prior hypermetabolic nodule. A small lymph node is noted in the abdomen anterior to the stomach stable in appearance from the prior exam. Musculoskeletal: Degenerative changes of the thoracic spine are noted. Review of the MIP images confirms the above findings. IMPRESSION: No evidence of pulmonary emboli. Stable nodularity within the right middle lobe unchanged from 2017 consistent  with a benign etiology. Previously seen nodularity in the anterior abdominal wall is less prominent with a small fluid collection identified. Correlate with any recent excision in this region. Aortic Atherosclerosis (ICD10-I70.0). Electronically Signed   By: Inez Catalina M.D.   On: 07/08/2020 17:04    Cardiac Studies   As above  PMHx   Past Medical History:  Diagnosis Date  . Anxiety   . Arthritis   . CHF (congestive heart failure) ([place])   . Hypertension     History reviewed. No pertinent surgical history.  FAMHx   History reviewed. No pertinent family history.  SOCHx    reports that he has never smoked. His smokeless tobacco use includes chew. He reports previous alcohol use. He reports that he does not use drugs.  Outpatient Medications   No current facility-administered medications on file prior to encounter.   Current Outpatient Medications on File Prior to Encounter  Medication Sig Dispense Refill  . ALPRAZolam (XANAX) 0.5 MG tablet Take 0.5 mg by mouth 3 (three) times daily as needed.    Marland Kitchen aspirin EC 81 MG tablet Take 1 tablet (81 mg total) by mouth daily. Swallow whole. 90 tablet 0  . buPROPion (WELLBUTRIN XL) 300 MG 24 hr tablet 300 mg in the morning and at bedtime.    . carvedilol (COREG) 3.125 MG tablet Take 3.125 mg  by mouth 2 (two) times daily.    . cholecalciferol (VITAMIN D3) 25 MCG (1000 UNIT) tablet Take 1,000 Units by mouth daily.    Marland Kitchen ENTRESTO 24-26 MG 1 tablet daily.    . furosemide (LASIX) 20 MG tablet Take 1 tablet (20 mg total) by mouth daily. 90 tablet 0  . HYDROcodone-acetaminophen (NORCO) 10-325 MG tablet Take 1 tablet by mouth 3 (three) times daily as needed.    . isosorbide mononitrate (IMDUR) 30 MG 24 hr tablet Take 1 tablet (30 mg total) by mouth daily. 90 tablet 0  . ketoconazole (NIZORAL) 2 % cream     . montelukast (SINGULAIR) 10 MG tablet 10 mg daily.    . Multiple Vitamin (MULTIVITAMIN) tablet Take 1 tablet by mouth daily.    .  nitroGLYCERIN (NITROSTAT) 0.4 MG SL tablet Place 1 tablet (0.4 mg total) under the tongue every 5 (five) minutes as needed for chest pain. 25 tablet 0  . Omega-3 Fatty Acids (FISH OIL) 1000 MG CAPS Take by mouth daily.    Marland Kitchen omeprazole (PRILOSEC) 40 MG capsule Take 1 capsule by mouth daily.    . ondansetron (ZOFRAN) 4 MG tablet Take 1 tablet by mouth 2 (two) times daily as needed.    . tamsulosin (FLOMAX) 0.4 MG CAPS capsule 0.4 mg daily.    . vitamin B-12 (CYANOCOBALAMIN) 250 MCG tablet Take 250 mcg by mouth daily.      Inpatient Medications    Scheduled Meds: . aspirin EC  81 mg Oral Daily  . [START ON 07/10/2020] aspirin EC  81 mg Oral Daily  . buPROPion  300 mg Oral Daily  . carvedilol  3.125 mg Oral BID  . furosemide  20 mg Oral Daily  . isosorbide mononitrate  30 mg Oral Daily  . sacubitril-valsartan  1 tablet Oral BID  . tamsulosin  0.4 mg Oral Daily    Continuous Infusions: . bivalirudin (ANGIOMAX) infusion 0.5 mg/mL (Non-ACS indications)    . lactated ringers 50 mL/hr at 07/09/20 0145    PRN Meds: acetaminophen, ALPRAZolam, HYDROcodone-acetaminophen, nitroGLYCERIN, ondansetron (ZOFRAN) IV   ALLERGIES   Allergies  Allergen Reactions  . Pork-Derived Products Anaphylaxis  . Amoxicillin Rash    ROS   Negative except above  Vitals   Vitals:   07/08/20 2352 07/09/20 0052 07/09/20 0115 07/09/20 0149  BP: (!) 116/53 104/66 (!) 116/92 (!) 147/88  Pulse: 65 82 70 67  Resp: 12 19 (!) 9 14  Temp:      TempSrc:      SpO2: 95% 97% 97% 95%  Weight:      Height:       No intake or output data in the 24 hours ending 07/09/20 0223 Filed Weights   07/08/20 2113  Weight: (!) 150 kg    Physical Exam   HEENT: Normocephalic and atraumatic, obese gentleman Neck: Supple. No carotid bruits. No JVD. Heart: regular rate rhythm, normal S1 and S2.no murmurs No gallops or rubs. Radial and distal pedal pulses 2+ and equal bilaterally. Lungs: clear to auscultation b/l, no  crackles, wheeze Abdomen: Soft, non-distended, and non-tender to palpation. Bowel sounds present. Extremities: No lower extremity edema.    Skin: Warm and dry. Neuro: Alert,oriented x3, No focal deficits. Psych: Normal affect. Responds appropriately.  Labs   Results for orders placed or performed during the hospital encounter of 07/08/20 (from the past 48 hour(s))  Basic metabolic panel     Status: Abnormal   Collection Time: 07/08/20  9:25 PM  Result Value Ref Range   Sodium 141 135 - 145 mmol/L   Potassium 4.6 3.5 - 5.1 mmol/L   Chloride 107 98 - 111 mmol/L   CO2 25 22 - 32 mmol/L   Glucose, Bld 138 (H) 70 - 99 mg/dL    Comment: Glucose reference range applies only to samples taken after fasting for at least 8 hours.   BUN 17 8 - 23 mg/dL   Creatinine, Ser 1.38 (H) 0.61 - 1.24 mg/dL   Calcium 9.0 8.9 - 10.3 mg/dL   GFR, Estimated 49 (L) >60 mL/min   Anion gap 9 5 - 15    Comment: Performed at [facility] [address] [place] [address]  CBC     Status: None   Collection Time: 07/08/20  9:25 PM  Result Value Ref Range   WBC 6.8 4.0 - 10.5 K/uL   RBC 4.54 4.22 - 5.81 MIL/uL   Hemoglobin 13.3 13.0 - 17.0 g/dL   HCT 43.0 39 - 52 %   MCV 94.7 80.0 - 100.0 fL   MCH 29.3 26.0 - 34.0 pg   MCHC 30.9 30.0 - 36.0 g/dL   RDW 13.6 11.5 - 15.5 %   Platelets 153 150 - 400 K/uL   nRBC 0.0 0.0 - 0.2 %    Comment: Performed at [facility], [place] [address] [place], [place] [address]  Troponin I (High Sensitivity)     Status: Abnormal   Collection Time: 07/08/20  9:25 PM  Result Value Ref Range   Troponin I (High Sensitivity) 221 (HH) <18 ng/L    Comment: CRITICAL RESULT CALLED TO, READ BACK BY AND VERIFIED WITH: R.SANGALANG,RN @2251  07/08/2020 VANG.J (NOTE) Elevated high sensitivity troponin I (hsTnI) values and significant  changes across serial measurements may suggest ACS but many other  chronic and acute conditions are known to elevate hsTnI results.    Refer to the Links section for chest pain algorithms and additional  guidance. Performed at [facility], [place] [address] [place], [place] [address]   Protime-INR (order if Patient is taking Coumadin / Warfarin)     Status: None   Collection Time: 07/08/20  9:25 PM  Result Value Ref Range   Prothrombin Time 13.0 11.4 - 15.2 seconds   INR 1.0 0.8 - 1.2    Comment: (NOTE) INR goal varies based on device and disease states. Performed at [facility], [place] [address], [place], [place] [address]   Troponin I (High Sensitivity)     Status: Abnormal   Collection Time: 07/08/20 11:17 PM  Result Value Ref Range   Troponin I (High Sensitivity) 187 (HH) <18 ng/L    Comment: CRITICAL VALUE NOTED.  VALUE IS CONSISTENT WITH PREVIOUSLY REPORTED AND CALLED VALUE. (NOTE) Elevated high sensitivity troponin I (hsTnI) values and significant  changes across serial measurements may suggest ACS but many other  chronic and acute conditions are known to elevate hsTnI results.  Refer to the Links section for chest pain algorithms and additional  guidance. Performed at [facility], [place] [address] [place], [place] [address]   Respiratory Panel by RT PCR (Flu A&B, Covid) - Nasopharyngeal Swab     Status: None   Collection Time: 07/08/20 11:51 PM   Specimen: Nasopharyngeal Swab  Result Value Ref Range   SARS Coronavirus 2 by RT PCR NEGATIVE NEGATIVE    Comment: (NOTE) SARS-CoV-2 target nucleic acids are NOT DETECTED.  The SARS-CoV-2 RNA  is generally detectable in upper respiratoy specimens during the acute phase of infection. The lowest concentration of SARS-CoV-2 viral copies this assay can detect is 131 copies/mL. A negative result does not preclude SARS-Cov-2 infection and should not be used as the sole basis for treatment or other patient management decisions. A negative result may occur with  improper specimen collection/handling, submission of specimen other than  nasopharyngeal swab, presence of viral mutation(s) within the areas targeted by this assay, and inadequate number of viral copies (<131 copies/mL). A negative result must be combined with clinical observations, patient history, and epidemiological information. The expected result is Negative.  Fact Sheet for Patients:  PinkCheek.be  Fact Sheet for Healthcare Providers:  GravelBags.it  This test is no t yet approved or cleared by the [place] FDA and  has been authorized for detection and/or diagnosis of SARS-CoV-2 by FDA under an Emergency Use Authorization (EUA). This EUA will remain  in effect (meaning this test can be used) for the duration of the COVID-19 declaration under Section 564(b)(1) of the Act, 21 U.S.C. section 360bbb-3(b)(1), unless the authorization is terminated or revoked sooner.     Influenza A by PCR NEGATIVE NEGATIVE   Influenza B by PCR NEGATIVE NEGATIVE    Comment: (NOTE) The Xpert Xpress SARS-CoV-2/FLU/RSV assay is intended as an aid in  the diagnosis of influenza from Nasopharyngeal swab specimens and  should not be used as a sole basis for treatment. Nasal washings and  aspirates are unacceptable for Xpert Xpress SARS-CoV-2/FLU/RSV  testing.  Fact Sheet for Patients: PinkCheek.be  Fact Sheet for Healthcare Providers: GravelBags.it  This test is not yet approved or cleared by the [place] FDA and  has been authorized for detection and/or diagnosis of SARS-CoV-2 by  FDA under an Emergency Use Authorization (EUA). This EUA will remain  in effect (meaning this test can be used) for the duration of the  Covid-19 declaration under Section 564(b)(1) of the Act, 21  U.S.C. section 360bbb-3(b)(1), unless the authorization is  terminated or revoked. Performed at [facility], [place] [address], [place], [place] [address]          Length of Stay:  LOS: 0 days      Renae Fickle 07/09/2020, 2:23 AM

## 2020-07-10 ENCOUNTER — Inpatient Hospital Stay (HOSPITAL_COMMUNITY): Payer: Medicare Other

## 2020-07-10 DIAGNOSIS — R079 Chest pain, unspecified: Secondary | ICD-10-CM

## 2020-07-10 DIAGNOSIS — I10 Essential (primary) hypertension: Secondary | ICD-10-CM | POA: Diagnosis not present

## 2020-07-10 DIAGNOSIS — I5031 Acute diastolic (congestive) heart failure: Secondary | ICD-10-CM

## 2020-07-10 DIAGNOSIS — N179 Acute kidney failure, unspecified: Secondary | ICD-10-CM

## 2020-07-10 DIAGNOSIS — I214 Non-ST elevation (NSTEMI) myocardial infarction: Secondary | ICD-10-CM | POA: Diagnosis not present

## 2020-07-10 LAB — CBC
HCT: 38.2 % — ABNORMAL LOW (ref 39.0–52.0)
Hemoglobin: 12.5 g/dL — ABNORMAL LOW (ref 13.0–17.0)
MCH: 29.8 pg (ref 26.0–34.0)
MCHC: 32.7 g/dL (ref 30.0–36.0)
MCV: 91.2 fL (ref 80.0–100.0)
Platelets: 148 10*3/uL — ABNORMAL LOW (ref 150–400)
RBC: 4.19 MIL/uL — ABNORMAL LOW (ref 4.22–5.81)
RDW: 13.2 % (ref 11.5–15.5)
WBC: 7.3 10*3/uL (ref 4.0–10.5)
nRBC: 0 % (ref 0.0–0.2)

## 2020-07-10 LAB — ECHOCARDIOGRAM COMPLETE
Area-P 1/2: 2.66 cm2
Height: 74 in
S' Lateral: 3.9 cm
Weight: 4754.88 oz

## 2020-07-10 LAB — COMPREHENSIVE METABOLIC PANEL
ALT: 19 U/L (ref 0–44)
AST: 21 U/L (ref 15–41)
Albumin: 3.5 g/dL (ref 3.5–5.0)
Alkaline Phosphatase: 37 U/L — ABNORMAL LOW (ref 38–126)
Anion gap: 8 (ref 5–15)
BUN: 16 mg/dL (ref 8–23)
CO2: 26 mmol/L (ref 22–32)
Calcium: 8.8 mg/dL — ABNORMAL LOW (ref 8.9–10.3)
Chloride: 104 mmol/L (ref 98–111)
Creatinine, Ser: 1.13 mg/dL (ref 0.61–1.24)
GFR, Estimated: >60 mL/min (ref >60)
Glucose, Bld: 100 mg/dL — ABNORMAL HIGH (ref 70–99)
Potassium: 4 mmol/L (ref 3.5–5.1)
Sodium: 138 mmol/L (ref 135–145)
Total Bilirubin: 0.8 mg/dL (ref 0.3–1.2)
Total Protein: 6.2 g/dL — ABNORMAL LOW (ref 6.5–8.1)

## 2020-07-10 LAB — APTT: aPTT: 65 seconds — ABNORMAL HIGH (ref 24–36)

## 2020-07-10 MED ORDER — SODIUM CHLORIDE 0.9 % IV SOLN: INTRAVENOUS | Status: DC

## 2020-07-10 MED ORDER — PERFLUTREN LIPID MICROSPHERE
1.0000 mL | INTRAVENOUS | Status: AC | PRN
  Administered 2020-07-10: 5 mL via INTRAVENOUS
  Filled 2020-07-10: qty 10

## 2020-07-10 MED ORDER — LOPERAMIDE HCL 2 MG PO CAPS: 2.0000 mg | ORAL_CAPSULE | ORAL | Status: DC | PRN

## 2020-07-10 MED ORDER — SODIUM CHLORIDE 0.9 % IV SOLN: 250.0000 mL | INTRAVENOUS | Status: DC | PRN

## 2020-07-10 MED ORDER — SODIUM CHLORIDE 0.9% FLUSH
3.0000 mL | Freq: Two times a day (BID) | INTRAVENOUS | Status: DC
  Administered 2020-07-10 – 2020-07-13 (×5): 3 mL via INTRAVENOUS

## 2020-07-10 MED ORDER — SODIUM CHLORIDE 0.9% FLUSH: 3.0000 mL | INTRAVENOUS | Status: DC | PRN

## 2020-07-10 NOTE — Progress Notes (Signed)
  Echocardiogram 2D Echocardiogram has been performed.  Fidel Levy 07/10/2020, 3:05 PM

## 2020-07-10 NOTE — Plan of Care (Signed)

## 2020-07-10 NOTE — Progress Notes (Signed)
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 Heparin allergy on bivalirudin.  Subjective   No issues overnight. Troponin up to 221, then declined (021,117,356). LDL 55, trigs 255. Creatinine improved today at 1.13. CT angio demonstrated no PE, but there were "diffuse calcifications".  Objective   Vitals:   07/09/20 2024 07/10/20 0014 07/10/20 0501 07/10/20 0825  BP: (!) 120/51 (!) 121/47 (!) 123/58 130/69  Pulse: 67 62 64 61  Resp: 16 16 12 16   Temp: 98.1 F (36.7 C) 98 F (36.7 C) 98.2 F (36.8 C) 97.7 F (36.5 C)  TempSrc: Oral Oral Oral Oral  SpO2: 96% 96% 96% 93%  Weight:      Height:        Intake/Output Summary (Last 24 hours) at 07/10/2020 1102 Last data filed at 07/10/2020 0406 Gross per 24 hour  Intake 1804.93 ml  Output 650 ml  Net 1154.93 ml   Filed Weights   07/08/20 2113 07/09/20 1857  Weight: (!) 150 kg 134.8 kg    Physical Exam   General appearance: alert and no distress Neck: no carotid bruit, no JVD and thyroid not enlarged, symmetric, no tenderness/mass/nodules Lungs: clear to auscultation bilaterally Heart: regular rate and rhythm Abdomen: soft, non-tender; bowel sounds normal; no masses,  no organomegaly and obese Extremities: extremities normal, atraumatic, no cyanosis or edema Pulses: 2+ and symmetric Skin: Skin color, texture, turgor normal. No rashes or lesions Neurologic: Grossly normal Psych: Pleasant  Inpatient Medications    Scheduled Meds: . aspirin EC  81 mg Oral Daily  . buPROPion  300 mg Oral Daily  . carvedilol  3.125 mg Oral BID  . cholecalciferol  1,000 Units Oral Daily  . furosemide  20 mg Oral Daily  . isosorbide mononitrate  30 mg Oral Daily  . multivitamin with minerals  1 tablet Oral  Daily  . pantoprazole  40 mg Oral Daily  . sacubitril-valsartan  1 tablet Oral BID  . tamsulosin  0.4 mg Oral Daily    Continuous Infusions: . bivalirudin (ANGIOMAX) infusion 0.5 mg/mL (Non-ACS indications) 0.15 mg/kg/hr (07/10/20 0406)  . lactated ringers 50 mL/hr at 07/09/20 2320    PRN Meds: acetaminophen, ALPRAZolam, HYDROcodone-acetaminophen, nitroGLYCERIN, ondansetron (ZOFRAN) IV   Labs   Results for orders placed or performed during the hospital encounter of 07/08/20 (from the past 48 hour(s))  Basic metabolic panel     Status: Abnormal   Collection Time: 07/08/20  9:25 PM  Result Value Ref Range   Sodium 141 135 - 145 mmol/L   Potassium 4.6 3.5 - 5.1 mmol/L   Chloride 107 98 - 111 mmol/L   CO2 25 22 - 32 mmol/L   Glucose, Bld 138 (H) 70 - 99 mg/dL    Comment: Glucose reference range applies only to samples taken after fasting for at least 8 hours.   BUN 17 8 - 23 mg/dL   Creatinine, Ser 1.38 (H) 0.61 - 1.24 mg/dL   Calcium 9.0 8.9 - 10.3 mg/dL   GFR, Estimated 49 (L) >60 mL/min   Anion gap 9 5 - 15    Comment: Performed at [facility] [address], [place], [place] [address]  CBC     Status: None   Collection Time: 07/08/20  9:25 PM  Result Value Ref Range   WBC 6.8 4.0 - 10.5 K/uL   RBC 4.54 4.22 - 5.81 MIL/uL   Hemoglobin 13.3 13.0 - 17.0 g/dL   HCT 43.0 39 - 52 %   MCV 94.7 80.0 - 100.0 fL   MCH 29.3 26.0 - 34.0 pg   MCHC 30.9 30.0 - 36.0 g/dL   RDW 13.6 11.5 - 15.5 %   Platelets 153 150 - 400 K/uL   nRBC 0.0 0.0 - 0.2 %    Comment: Performed at [facility] [address] [place], [place] [address]  Troponin I (High Sensitivity)     Status: Abnormal   Collection Time: 07/08/20  9:25 PM  Result Value Ref Range   Troponin I (High Sensitivity) 221 (HH) <18 ng/L    Comment: CRITICAL RESULT CALLED TO, READ BACK BY AND VERIFIED WITH: R.SANGALANG,RN @2251  07/08/2020 VANG.J (NOTE) Elevated high sensitivity troponin I (hsTnI) values and  significant  changes across serial measurements may suggest ACS but many other  chronic and acute conditions are known to elevate hsTnI results.  Refer to the Links section for chest pain algorithms and additional  guidance. Performed at [facility], [place] [address], [place], [place] [address]   Protime-INR (order if Patient is taking Coumadin / Warfarin)     Status: None   Collection Time: 07/08/20  9:25 PM  Result Value Ref Range   Prothrombin Time 13.0 11.4 - 15.2 seconds   INR 1.0 0.8 - 1.2    Comment: (NOTE) INR goal varies based on device and disease states. Performed at [facility], [place] [address] [place], [place] [address]   Troponin I (High Sensitivity)     Status: Abnormal   Collection Time: 07/08/20 11:17 PM  Result Value Ref Range   Troponin I (High Sensitivity) 187 (HH) <18 ng/L    Comment: CRITICAL VALUE NOTED.  VALUE IS CONSISTENT WITH PREVIOUSLY REPORTED AND CALLED VALUE. (NOTE) Elevated high sensitivity troponin I (hsTnI) values and significant  changes across serial measurements may suggest ACS but many other  chronic and acute conditions are known to elevate hsTnI results.  Refer to the Links section for chest pain algorithms and additional  guidance. Performed at [facility], [place] [address] [place], [place] [address]   Respiratory Panel by RT PCR (Flu A&B, Covid) - Nasopharyngeal Swab     Status: None   Collection Time: 07/08/20 11:51 PM   Specimen: Nasopharyngeal Swab  Result Value Ref Range   SARS Coronavirus 2 by RT PCR NEGATIVE NEGATIVE    Comment: (NOTE) SARS-CoV-2 target nucleic acids are NOT DETECTED.  The SARS-CoV-2 RNA is generally detectable in upper respiratoy specimens during the acute phase of infection. The lowest concentration of SARS-CoV-2 viral copies this assay can detect is 131 copies/mL. A negative result does not preclude SARS-Cov-2 infection and should not be used as the sole basis for treatment or other  patient management decisions. A negative result may occur with  improper specimen collection/handling, submission of specimen other than nasopharyngeal swab, presence of viral mutation(s) within the areas targeted by this assay, and inadequate number of viral copies (<131 copies/mL). A negative result must be combined with clinical observations, patient history, and epidemiological information. The expected result is Negative.  Fact Sheet for Patients:  PinkCheek.be  Fact Sheet for Healthcare Providers:  GravelBags.it  This test is no t yet approved or cleared by the [place] and  has been authorized  for detection and/or diagnosis of SARS-CoV-2 by FDA under an Emergency Use Authorization (EUA). This EUA will remain  in effect (meaning this test can be used) for the duration of the COVID-19 declaration under Section 564(b)(1) of the Act, 21 U.S.C. section 360bbb-3(b)(1), unless the authorization is terminated or revoked sooner.     Influenza A by PCR NEGATIVE NEGATIVE   Influenza B by PCR NEGATIVE NEGATIVE    Comment: (NOTE) The Xpert Xpress SARS-CoV-2/FLU/RSV assay is intended as an aid in  the diagnosis of influenza from Nasopharyngeal swab specimens and  should not be used as a sole basis for treatment. Nasal washings and  aspirates are unacceptable for Xpert Xpress SARS-CoV-2/FLU/RSV  testing.  Fact Sheet for Patients: PinkCheek.be  Fact Sheet for Healthcare Providers: GravelBags.it  This test is not yet approved or cleared by the [place] FDA and  has been authorized for detection and/or diagnosis of SARS-CoV-2 by  FDA under an Emergency Use Authorization (EUA). This EUA will remain  in effect (meaning this test can be used) for the duration of the  Covid-19 declaration under Section 564(b)(1) of the Act, 21  U.S.C. section 360bbb-3(b)(1),  unless the authorization is  terminated or revoked. Performed at [facility], [place] [address] [place], [place] [address]   Basic metabolic panel     Status: Abnormal   Collection Time: 07/09/20  3:54 AM  Result Value Ref Range   Sodium 140 135 - 145 mmol/L   Potassium 4.2 3.5 - 5.1 mmol/L   Chloride 107 98 - 111 mmol/L   CO2 21 (L) 22 - 32 mmol/L   Glucose, Bld 123 (H) 70 - 99 mg/dL    Comment: Glucose reference range applies only to samples taken after fasting for at least 8 hours.   BUN 18 8 - 23 mg/dL   Creatinine, Ser 1.29 (H) 0.61 - 1.24 mg/dL   Calcium 8.7 (L) 8.9 - 10.3 mg/dL   GFR, Estimated 53 (L) >60 mL/min   Anion gap 12 5 - 15    Comment: Performed at [facility] [address], [place], [place] [address]  CBC     Status: Abnormal   Collection Time: 07/09/20  3:54 AM  Result Value Ref Range   WBC 7.1 4.0 - 10.5 K/uL   RBC 4.35 4.22 - 5.81 MIL/uL   Hemoglobin 12.9 (L) 13.0 - 17.0 g/dL   HCT 41.8 39 - 52 %   MCV 96.1 80.0 - 100.0 fL   MCH 29.7 26.0 - 34.0 pg   MCHC 30.9 30.0 - 36.0 g/dL   RDW 13.6 11.5 - 15.5 %   Platelets 144 (L) 150 - 400 K/uL   nRBC 0.0 0.0 - 0.2 %    Comment: Performed at [facility], [place] [address], [place], [place] [address]  APTT     Status: Abnormal   Collection Time: 07/09/20  3:54 AM  Result Value Ref Range   aPTT 59 (H) 24 - 36 seconds    Comment:        IF BASELINE aPTT IS ELEVATED, SUGGEST PATIENT RISK ASSESSMENT BE USED TO DETERMINE APPROPRIATE ANTICOAGULANT THERAPY. Performed at [facility], [place] [address], [place], [place] [address]   Troponin I (High Sensitivity)     Status: Abnormal   Collection Time: 07/09/20  3:54 AM  Result Value Ref Range   Troponin I (High Sensitivity) 172 (HH) <18 ng/L    Comment: CRITICAL VALUE NOTED.  VALUE IS CONSISTENT WITH PREVIOUSLY REPORTED AND CALLED VALUE. (NOTE) Elevated high sensitivity troponin I (hsTnI) values and significant  changes across serial  measurements may suggest ACS but many other  chronic and acute conditions are known to elevate hsTnI results.  Refer to the Links section for chest pain algorithms and additional  guidance. Performed at [facility], [place] [address] [place], [place] [address]   Lipid panel     Status: Abnormal   Collection Time: 07/09/20  3:54 AM  Result Value Ref Range   Cholesterol 139 0 - 200 mg/dL   Triglycerides 255 (H) <150 mg/dL   HDL 33 (L) >40 mg/dL   Total CHOL/HDL Ratio 4.2 RATIO   VLDL 51 (H) 0 - 40 mg/dL   LDL Cholesterol 55 0 - 99 mg/dL    Comment:        Total Cholesterol/HDL:CHD Risk Coronary Heart Disease Risk Table                     Men   Women  1/2 Average Risk   3.4   3.3  Average Risk       5.0   4.4  2 X Average Risk   9.6   7.1  3 X Average Risk  23.4   11.0        Use the calculated Patient Ratio above and the CHD Risk Table to determine the patient's CHD Risk.        ATP III CLASSIFICATION (LDL):  <100     mg/dL   Optimal  100-129  mg/dL   Near or Above                    Optimal  130-159  mg/dL   Borderline  160-189  mg/dL   High  >190     mg/dL   Very High Performed at [facility] [address], [place], [place] [address]   Troponin I (High Sensitivity)     Status: Abnormal   Collection Time: 07/09/20 10:38 AM  Result Value Ref Range   Troponin I (High Sensitivity) 157 (HH) <18 ng/L    Comment: CRITICAL VALUE NOTED.  VALUE IS CONSISTENT WITH PREVIOUSLY REPORTED AND CALLED VALUE. (NOTE) Elevated high sensitivity troponin I (hsTnI) values and significant  changes across serial measurements may suggest ACS but many other  chronic and acute conditions are known to elevate hsTnI results.  Refer to the Links section for chest pain algorithms and additional  guidance. Performed at [facility], [place] [address], [place], [place] [address]   APTT     Status: Abnormal   Collection Time: 07/09/20 10:38 AM  Result Value Ref Range   aPTT 60  (H) 24 - 36 seconds    Comment:        IF BASELINE aPTT IS ELEVATED, SUGGEST PATIENT RISK ASSESSMENT BE USED TO DETERMINE APPROPRIATE ANTICOAGULANT THERAPY. Performed at [facility], [place] [address] [place], [place] [address]   APTT     Status: Abnormal   Collection Time: 07/10/20  1:10 AM  Result Value Ref Range   aPTT 65 (H) 24 - 36 seconds    Comment:        IF BASELINE aPTT IS ELEVATED, SUGGEST PATIENT RISK ASSESSMENT BE USED TO DETERMINE APPROPRIATE ANTICOAGULANT THERAPY. Performed at [facility], [place] [address] [place], [place] [address]   CBC     Status: Abnormal  Collection Time: 07/10/20  1:10 AM  Result Value Ref Range   WBC 7.3 4.0 - 10.5 K/uL   RBC 4.19 (L) 4.22 - 5.81 MIL/uL   Hemoglobin 12.5 (L) 13.0 - 17.0 g/dL   HCT 38.2 (L) 39 - 52 %   MCV 91.2 80.0 - 100.0 fL   MCH 29.8 26.0 - 34.0 pg   MCHC 32.7 30.0 - 36.0 g/dL   RDW 13.2 11.5 - 15.5 %   Platelets 148 (L) 150 - 400 K/uL   nRBC 0.0 0.0 - 0.2 %    Comment: Performed at [facility] [address], [place], [place] [address]  Comprehensive metabolic panel     Status: Abnormal   Collection Time: 07/10/20  1:10 AM  Result Value Ref Range   Sodium 138 135 - 145 mmol/L   Potassium 4.0 3.5 - 5.1 mmol/L   Chloride 104 98 - 111 mmol/L   CO2 26 22 - 32 mmol/L   Glucose, Bld 100 (H) 70 - 99 mg/dL    Comment: Glucose reference range applies only to samples taken after fasting for at least 8 hours.   BUN 16 8 - 23 mg/dL   Creatinine, Ser 1.13 0.61 - 1.24 mg/dL   Calcium 8.8 (L) 8.9 - 10.3 mg/dL   Total Protein 6.2 (L) 6.5 - 8.1 g/dL   Albumin 3.5 3.5 - 5.0 g/dL   AST 21 15 - 41 U/L   ALT 19 0 - 44 U/L   Alkaline Phosphatase 37 (L) 38 - 126 U/L   Total Bilirubin 0.8 0.3 - 1.2 mg/dL   GFR, Estimated >60 >60 mL/min   Anion gap 8 5 - 15    Comment: Performed at [facility], [place] [address], [place], [place] [address]    ECG   N/A  Telemetry   Sinus rhythm - occasional  ventricular trigeminy - Personally Reviewed  Radiology    DG Chest 2 View  Result Date: 07/08/2020 CLINICAL DATA:  Chest pain EXAM: CHEST - 2 VIEW COMPARISON:  CTA from earlier in the same day. FINDINGS: Cardiac shadow is within normal limits. Lungs are well aerated bilaterally. No focal infiltrate or sizable effusion is seen. No bony abnormality is noted. IMPRESSION: No acute abnormality seen. Electronically Signed   By: Inez Catalina M.D.   On: 07/08/2020 22:12   CT ANGIO CHEST PE W OR WO CONTRAST  Result Date: 07/08/2020 CLINICAL DATA:  Chest pain and shortness of breath EXAM: CT ANGIOGRAPHY CHEST WITH CONTRAST TECHNIQUE: Multidetector CT imaging of the chest was performed using the standard protocol during bolus administration of intravenous contrast. Multiplanar CT image reconstructions and MIPs were obtained to evaluate the vascular anatomy. CONTRAST:  28mL OMNIPAQUE IOHEXOL 350 MG/ML SOLN COMPARISON:  05/09/2020 PET-CT FINDINGS: Cardiovascular: Thoracic aorta demonstrates atherosclerotic calcifications without aneurysmal dilatation or dissection. No cardiac enlargement is seen. The coronary arteries demonstrate diffuse calcifications. The pulmonary artery shows a normal branching pattern without definitive filling defect to suggest pulmonary embolism. Mediastinum/Nodes: Thoracic inlet is within normal limits. No sizable hilar or mediastinal adenopathy is noted. The esophagus as visualized is normal limits. Lungs/Pleura: The lungs are well aerated bilaterally. No focal infiltrate or sizable effusion is seen. No pneumothorax or sizable parenchymal nodule is noted. Some stable perivascular nodularity is noted unchanged from 2017 consistent with a benign etiology. Upper Abdomen: Visualized upper abdomen shows changes of prior cholecystectomy. An area of inflammatory changes noted in the subcutaneous tissues in an area of prior  hypermetabolic nodule. A small lymph node is noted in the abdomen anterior  to the stomach stable in appearance from the prior exam. Musculoskeletal: Degenerative changes of the thoracic spine are noted. Review of the MIP images confirms the above findings. IMPRESSION: No evidence of pulmonary emboli. Stable nodularity within the right middle lobe unchanged from 2017 consistent with a benign etiology. Previously seen nodularity in the anterior abdominal wall is less prominent with a small fluid collection identified. Correlate with any recent excision in this region. Aortic Atherosclerosis (ICD10-I70.0). Electronically Signed   By: Inez Catalina M.D.   On: 07/08/2020 17:04    Cardiac Studies   Echo pending today  Assessment   1. Principal Problem: 2.   NSTEMI (non-ST elevated myocardial infarction) ([place]) 3. Active Problems: 4.   Diastolic congestive heart failure ([place]) 5.   Essential hypertension 6.   AKI (acute kidney injury) ([place]) 7.   Plan   1. No further chest pain overnight - troponin downtrending. On bivalirudin due to heparin allergy. 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 Would recommend proceeding directly with cath tomorrow. Discussed the risks, benefits and alternatives with him and he understands them and is willing to proceed. Please keep NPO p MN.  Time Spent Directly with Patient:  I have spent a total of 25 minutes with the patient reviewing hospital notes, telemetry, EKGs, labs and examining the patient as well as establishing an assessment and plan that was discussed personally with the patient.  > 50% of time was spent in direct patient care.  Length of Stay:  LOS: 1 day   Pixie Casino, MD, [facility], [place] Director of the Advanced Lipid Disorders &  Cardiovascular Risk Reduction Clinic Diplomate of the American Board of Clinical Lipidology Attending Cardiologist  Direct Dial: [phone]  Fax:  [phone]  Website:  [email or web address] 07/10/2020, 11:02 AM

## 2020-07-10 NOTE — Progress Notes (Signed)
Mobility Specialist - Progress Note   07/10/20 1336  Mobility  Activity Refused mobility    Pt states he does not wish to ambulate as he just got cleaned up after having an "accident".   Pricilla Handler Mobility Specialist Mobility Specialist Phone: [phone]

## 2020-07-10 NOTE — Progress Notes (Signed)
ANTICOAGULATION CONSULT NOTE - [place] for bivalirudin Indication: chest pain/ACS  Allergies  Allergen Reactions  . Pork-Derived Products Anaphylaxis  . Amoxicillin Rash    Patient Measurements: Height: 6\' 2"  (188 cm) Weight: 134.8 kg (297 lb 2.9 oz) IBW/kg (Calculated) : 82.2  Vital Signs: Temp: 97.7 F (36.5 C) (10/17 0825) Temp Source: Oral (10/17 0825) BP: 130/69 (10/17 0825) Pulse Rate: 61 (10/17 0825)  Labs: Recent Labs    07/08/20 2125 07/08/20 2125 07/08/20 2317 07/09/20 0354 07/09/20 1038 07/10/20 0110  HGB 13.3   < >  --  12.9*  --  12.5*  HCT 43.0  --   --  41.8  --  38.2*  PLT 153  --   --  144*  --  148*  APTT  --   --   --  59* 60* 65*  LABPROT 13.0  --   --   --   --   --   INR 1.0  --   --   --   --   --   CREATININE 1.38*  --   --  1.29*  --  1.13  TROPONINIHS 221*   < > 187* 172* 157*  --    < > = values in this interval not displayed.    Estimated Creatinine Clearance: 79.9 mL/min (by C-G formula based on SCr of 1.13 mg/dL).   Medical History: Past Medical History:  Diagnosis Date  . Anxiety   . Arthritis   . CHF (congestive heart failure) ([place])   . Hypertension     Assessment: 79 yo male c/o intermittent left CP radiating to LUE associated with mild SOB, troponin elevated, started on bivalirudin due to allergy.  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  APTT - 65 secs is therapeutic  Goal of Therapy:  aPTT 50-85 seconds Monitor platelets by anticoagulation protocol: Yes   Plan:  Continue bivalirudin at 0.15 mg/kg/hr Monitor CBC and PTT. Follow-up cards plans for cath vs stress test.  Horton Chin, Pharm.D., BCPS Clinical Pharmacist Clinical phone for 07/10/2020 from 8:30-4:00 is [phone].  **Pharmacist phone directory can be found on [email or web address] listed under [place].  07/10/2020 9:27 AM

## 2020-07-10 NOTE — Progress Notes (Signed)
PROGRESS NOTE  Gary [name] GYJ:[id number] DOB: [birth year] DOA: 07/08/2020 PCP: Bonnita Nasuti, MD  Brief History   The patient is a 79 yr old man who presented to [facility] on 07/08/2020 with complaints of chest pain. He carries a past medical history significant for diastolic CHF, hypertension, DDD lower back, osteoarthritis, and BPH. He states that he has had 3-4 episodes of chest pain over the past few days. It is described as aa heaviness/pressure/tightness and is not exertional. It is located in the sternally to left precordial with radiation to left shoulder and upper arm. It is not associated with nausea or diaphoresis. He does have chronic DOE that is unchanged from baseline. 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 He does not have PAD, DM, or family history of CAD.  In the ED the patient was found to have elevated troponins without EKG changes.   [facility] were consulted to admit the patient for further evaluation and treatment. Cardiology was consulted.  Plan is for the patient to go for LHC in the morning per cardiology. He will be NPO after night.  Consultants  . Cardiology  Procedures  . None  Antibiotics   Anti-infectives (From admission, onward)   None    .  Subjective  The patient is resting comfortably. He states that he has had no further chest pain. He has had a loose BM, though. Monitor for diarrhea.  Objective   Vitals:  Vitals:   07/10/20 0825 07/10/20 1120  BP: 130/69 (!) 121/57  Pulse: 61 79  Resp: 16   Temp: 97.7 F (36.5 C)   SpO2: 93%    Exam:  Constitutional:  . The patient is awake, alert, and oriented x 3. No acute distress. Respiratory:  . No increased work of breathing. . No wheezes, rales, or rhonchi . No tactile fremitus Cardiovascular:  . Regular rate and rhythm . No murmurs, ectopy, or gallups. . No lateral PMI. No  thrills. Abdomen:  . Abdomen is soft, non-tender, non-distended . No hernias, masses, or organomegaly . Normoactive bowel sounds.  Musculoskeletal:  . No cyanosis, clubbing, or edema Skin:  . No rashes, lesions, ulcers . palpation of skin: no induration or nodules Neurologic:  . CN 2-12 intact . Sensation all 4 extremities intact Psychiatric:  . Mental status o Mood, affect appropriate o Orientation to person, place, time  . judgment and insight appear intact  I have personally reviewed the following:   Today's Data  . Vitals, CMP, CBC,   Imaging  . CXR . CTA chest  Cardiology Data  . EKG . Echocardiogram  Scheduled Meds: . aspirin EC  81 mg Oral Daily  . buPROPion  300 mg Oral Daily  . carvedilol  3.125 mg Oral BID  . cholecalciferol  1,000 Units Oral Daily  . furosemide  20 mg Oral Daily  . isosorbide mononitrate  30 mg Oral Daily  . multivitamin with minerals  1 tablet Oral Daily  . pantoprazole  40 mg Oral Daily  . sacubitril-valsartan  1 tablet Oral BID  . sodium chloride flush  3 mL Intravenous Q12H  . tamsulosin  0.4 mg Oral Daily   Continuous Infusions: . bivalirudin (ANGIOMAX) infusion 0.5 mg/mL (Non-ACS indications) 0.15 mg/kg/hr (07/10/20 0406)  . lactated ringers 50 mL/hr at 07/09/20 2320    Principal Problem:   NSTEMI (non-ST elevated myocardial infarction) [facility]) Active Problems:   Diastolic congestive heart  failure ([place])   Essential hypertension   AKI (acute kidney injury) ([place])   LOS: 1 day    A & P  NSTEMI (non-ST elevated myocardial infarction) [facility]):  Gary [name] is admitted to cardiac telemetry with NSTEMI.  Obtain serial troponin levels.  Cardiology has been consulted by the ER physician and cardiology has seen in consultation. Antiplatelet therapy with aspirin daily.  Check lipid panel.  Monitor blood pressure.  Continue home medications of Coreg, Entresto.  Use nitroglycerin as needed for chest pain.  Supplemental oxygen as needed to  maintain O2 sat between 92-96% Plan is for the patient to go to [facility] on 07/11/2020.  Loose BM: Monitor for diarrhea. Low suspicion for infectious enteritis.  Diastolic congestive heart failure ([place]): Chronic.  Continue home medications as listed above.  Continue Lasix daily.  Essential hypertension: Monitor blood pressure.  Continue home medication  AKI (acute kidney injury) ([place]): Creatinine improved with hydration. Continue IV fluids.  I have seen and examined this patient myself. I have spent 34 minutes in his evaluation and care.  DVT Prophylaxis: Bivalrudin CODE STATUS: Full Code Family Communication: None available Disposition:  Status is: Inpatient  Remains inpatient appropriate because:Ongoing diagnostic testing needed not appropriate for outpatient work up   Dispo: The patient is from: Home              Anticipated d/c is to: Home              Anticipated d/c date is: 2 days              Patient currently is not medically stable to d/c.  Gary Haile, DO [facility] Direct contact: see [email or web address]  7PM-7AM contact night coverage as above 07/10/2020, 2:45 PM  LOS: 1 day

## 2020-07-11 ENCOUNTER — Inpatient Hospital Stay (HOSPITAL_COMMUNITY)
Admission: EM | Disposition: A | Discharge status: Home / Self Care | Payer: Self-pay | Source: Home / Self Care | Attending: Internal Medicine

## 2020-07-11 ENCOUNTER — Telehealth: Payer: Self-pay

## 2020-07-11 ENCOUNTER — Ambulatory Visit: Payer: Medicare Other | Admitting: Cardiology

## 2020-07-11 DIAGNOSIS — I10 Essential (primary) hypertension: Secondary | ICD-10-CM | POA: Diagnosis not present

## 2020-07-11 DIAGNOSIS — I5031 Acute diastolic (congestive) heart failure: Secondary | ICD-10-CM | POA: Diagnosis not present

## 2020-07-11 DIAGNOSIS — I2510 Atherosclerotic heart disease of native coronary artery without angina pectoris: Secondary | ICD-10-CM

## 2020-07-11 DIAGNOSIS — I214 Non-ST elevation (NSTEMI) myocardial infarction: Secondary | ICD-10-CM | POA: Diagnosis not present

## 2020-07-11 DIAGNOSIS — N179 Acute kidney failure, unspecified: Secondary | ICD-10-CM | POA: Diagnosis not present

## 2020-07-11 HISTORY — PX: LEFT HEART CATH AND CORONARY ANGIOGRAPHY: CATH118249

## 2020-07-11 HISTORY — PX: CORONARY STENT INTERVENTION: CATH118234

## 2020-07-11 LAB — CBC
HCT: 37.8 % — ABNORMAL LOW (ref 39.0–52.0)
Hemoglobin: 12.4 g/dL — ABNORMAL LOW (ref 13.0–17.0)
MCH: 29.6 pg (ref 26.0–34.0)
MCHC: 32.8 g/dL (ref 30.0–36.0)
MCV: 90.2 fL (ref 80.0–100.0)
Platelets: 141 10*3/uL — ABNORMAL LOW (ref 150–400)
RBC: 4.19 MIL/uL — ABNORMAL LOW (ref 4.22–5.81)
RDW: 12.9 % (ref 11.5–15.5)
WBC: 7.1 10*3/uL (ref 4.0–10.5)
nRBC: 0 % (ref 0.0–0.2)

## 2020-07-11 LAB — POCT ACTIVATED CLOTTING TIME
Activated Clotting Time: 175 seconds
Activated Clotting Time: 367 seconds

## 2020-07-11 LAB — APTT: aPTT: 64 seconds — ABNORMAL HIGH (ref 24–36)

## 2020-07-11 SURGERY — LEFT HEART CATH AND CORONARY ANGIOGRAPHY
Anesthesia: LOCAL

## 2020-07-11 MED ORDER — TICAGRELOR 90 MG PO TABS
ORAL_TABLET | ORAL | Status: DC | PRN
  Administered 2020-07-11: 180 mg via ORAL

## 2020-07-11 MED ORDER — TICAGRELOR 90 MG PO TABS
90.0000 mg | ORAL_TABLET | Freq: Two times a day (BID) | ORAL | Status: DC
  Administered 2020-07-12 – 2020-07-13 (×4): 90 mg via ORAL
  Filled 2020-07-11 (×4): qty 1

## 2020-07-11 MED ORDER — SODIUM CHLORIDE 0.9% FLUSH: 3.0000 mL | INTRAVENOUS | Status: DC | PRN

## 2020-07-11 MED ORDER — HEPARIN (PORCINE) IN NACL 1000-0.9 UT/500ML-% IV SOLN
INTRAVENOUS | Status: AC
  Filled 2020-07-11: qty 1000

## 2020-07-11 MED ORDER — LABETALOL HCL 5 MG/ML IV SOLN: 10.0000 mg | INTRAVENOUS | Status: AC | PRN

## 2020-07-11 MED ORDER — SODIUM CHLORIDE 0.9 % IV SOLN
INTRAVENOUS | Status: DC | PRN
  Administered 2020-07-11: 1.75 mg/kg/h via INTRAVENOUS

## 2020-07-11 MED ORDER — BIVALIRUDIN BOLUS VIA INFUSION - CUPID
INTRAVENOUS | Status: DC | PRN
  Administered 2020-07-11: 99.9 mg via INTRAVENOUS

## 2020-07-11 MED ORDER — SODIUM CHLORIDE 0.9 % IV SOLN: 250.0000 mL | INTRAVENOUS | Status: DC | PRN

## 2020-07-11 MED ORDER — LIDOCAINE HCL (PF) 1 % IJ SOLN
INTRAMUSCULAR | Status: DC | PRN
  Administered 2020-07-11: 2 mL

## 2020-07-11 MED ORDER — MIDAZOLAM HCL 2 MG/2ML IJ SOLN
INTRAMUSCULAR | Status: AC
  Filled 2020-07-11: qty 2

## 2020-07-11 MED ORDER — SODIUM CHLORIDE 0.9% FLUSH
3.0000 mL | Freq: Two times a day (BID) | INTRAVENOUS | Status: DC
  Administered 2020-07-12 – 2020-07-13 (×3): 3 mL via INTRAVENOUS

## 2020-07-11 MED ORDER — TICAGRELOR 90 MG PO TABS
ORAL_TABLET | ORAL | Status: AC
  Filled 2020-07-11: qty 2

## 2020-07-11 MED ORDER — FENTANYL CITRATE (PF) 100 MCG/2ML IJ SOLN
INTRAMUSCULAR | Status: DC | PRN
  Administered 2020-07-11 (×3): 25 ug via INTRAVENOUS

## 2020-07-11 MED ORDER — FENTANYL CITRATE (PF) 100 MCG/2ML IJ SOLN
INTRAMUSCULAR | Status: AC
  Filled 2020-07-11: qty 2

## 2020-07-11 MED ORDER — HYDRALAZINE HCL 20 MG/ML IJ SOLN: 10.0000 mg | INTRAMUSCULAR | Status: AC | PRN

## 2020-07-11 MED ORDER — IOHEXOL 350 MG/ML SOLN
INTRAVENOUS | Status: DC | PRN
  Administered 2020-07-11: 150 mL

## 2020-07-11 MED ORDER — VERAPAMIL HCL 2.5 MG/ML IV SOLN
INTRAVENOUS | Status: DC | PRN
  Administered 2020-07-11: 10 mL via INTRA_ARTERIAL

## 2020-07-11 MED ORDER — HYDROMORPHONE HCL 1 MG/ML IJ SOLN: 0.5000 mg | INTRAMUSCULAR | Status: DC | PRN

## 2020-07-11 MED ORDER — VERAPAMIL HCL 2.5 MG/ML IV SOLN
INTRAVENOUS | Status: AC
  Filled 2020-07-11: qty 2

## 2020-07-11 MED ORDER — SODIUM CHLORIDE 0.9 % IV SOLN: INTRAVENOUS | Status: AC

## 2020-07-11 MED ORDER — LIDOCAINE HCL (PF) 1 % IJ SOLN
INTRAMUSCULAR | Status: AC
  Filled 2020-07-11: qty 30

## 2020-07-11 MED ORDER — BIVALIRUDIN TRIFLUOROACETATE 250 MG IV SOLR
INTRAVENOUS | Status: AC
  Filled 2020-07-11: qty 250

## 2020-07-11 MED ORDER — MIDAZOLAM HCL 2 MG/2ML IJ SOLN
INTRAMUSCULAR | Status: DC | PRN
  Administered 2020-07-11 (×3): 1 mg via INTRAVENOUS

## 2020-07-11 MED ORDER — ROSUVASTATIN CALCIUM 20 MG PO TABS
20.0000 mg | ORAL_TABLET | Freq: Every day | ORAL | Status: DC
  Administered 2020-07-11: 20 mg via ORAL
  Filled 2020-07-11: qty 1

## 2020-07-11 SURGICAL SUPPLY — 18 items
BALLN SAPPHIRE 2.5X12 (BALLOONS) ×2
BALLOON SAPPHIRE 2.5X12 (BALLOONS) IMPLANT
CATH INFINITI 5FR JL4 (CATHETERS) ×1 IMPLANT
CATH OPTITORQUE TIG 4.0 5F (CATHETERS) ×1 IMPLANT
CATH VISTA GUIDE 6FR XBLAD3.5 (CATHETERS) ×1 IMPLANT
DEVICE RAD COMP TR BAND LRG (VASCULAR PRODUCTS) ×1 IMPLANT
GLIDESHEATH SLEND SS 6F .021 (SHEATH) ×1 IMPLANT
GUIDEWIRE INQWIRE 1.5J.035X260 (WIRE) IMPLANT
INQWIRE 1.5J .035X260CM (WIRE) ×2
KIT ENCORE 26 ADVANTAGE (KITS) ×1 IMPLANT
KIT HEART LEFT (KITS) ×2 IMPLANT
PACK CARDIAC CATHETERIZATION (CUSTOM PROCEDURE TRAY) ×2 IMPLANT
STENT RESOLUTE ONYX 2.5X12 (Permanent Stent) ×1 IMPLANT
TRANSDUCER W/STOPCOCK (MISCELLANEOUS) ×2 IMPLANT
TUBING ART PRESS 72  MALE/FEM (TUBING) ×2
TUBING ART PRESS 72 MALE/FEM (TUBING) IMPLANT
TUBING CIL FLEX 10 FLL-RA (TUBING) ×2 IMPLANT
WIRE RUNTHROUGH .014X180CM (WIRE) ×1 IMPLANT

## 2020-07-11 NOTE — Progress Notes (Signed)
ANTICOAGULATION CONSULT NOTE - [place] for bivalirudin Indication: chest pain/ACS  Allergies  Allergen Reactions  . Pork-Derived Products Anaphylaxis  . Amoxicillin Rash    Patient Measurements: Height: 6\' 2"  (188 cm) Weight: 133.2 kg (293 lb 10.4 oz) IBW/kg (Calculated) : 82.2  Vital Signs: Temp: 98.4 F (36.9 C) (10/18 0413) Temp Source: Oral (10/18 0413) BP: 147/73 (10/18 0413) Pulse Rate: 62 (10/18 0413)  Labs: Recent Labs    07/08/20 2125 07/08/20 2125 07/08/20 2317 07/09/20 0354 07/09/20 0354 07/09/20 1038 07/10/20 0110 07/11/20 0227  HGB 13.3   < >  --  12.9*   < >  --  12.5* 12.4*  HCT 43.0   < >  --  41.8  --   --  38.2* 37.8*  PLT 153   < >  --  144*  --   --  148* 141*  APTT  --   --   --  59*   < > 60* 65* 64*  LABPROT 13.0  --   --   --   --   --   --   --   INR 1.0  --   --   --   --   --   --   --   CREATININE 1.38*  --   --  1.29*  --   --  1.13  --   TROPONINIHS 221*   < > 187* 172*  --  157*  --   --    < > = values in this interval not displayed.    Estimated Creatinine Clearance: 79.4 mL/min (by C-G formula based on SCr of 1.13 mg/dL).   Medical History: Past Medical History:  Diagnosis Date  . Anxiety   . Arthritis   . CHF (congestive heart failure) ([place])   . Hypertension     Assessment: 79 yo male c/o intermittent left CP radiating to LUE associated with mild SOB, troponin elevated, started on bivalirudin due to allergy.  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  APTT - 64 secs is therapeutic. H/H, plt stable. LHC today.  Goal of Therapy:  aPTT 50-85 seconds Monitor platelets by anticoagulation protocol: Yes   Plan:  Continue bivalirudin at 0.15 mg/kg/hr Monitor CBC and PTT F/u LHC results and plan   Benetta Spar, PharmD, BCPS, BCCP Clinical Pharmacist  Please check AMION for all [place] phone numbers After 10:00 PM, call [place] [phone]

## 2020-07-11 NOTE — Progress Notes (Signed)
Mobility Specialist - Progress Note   07/11/20 1421  Mobility  Activity Off unit   Pt left for cath lab, will f/u as able.  Pricilla Handler Mobility Specialist Mobility Specialist Phone: [phone]

## 2020-07-11 NOTE — Plan of Care (Signed)

## 2020-07-11 NOTE — H&P (View-Only) (Signed)
Progress Note  Patient Name: Gary [name] Date of Encounter: 07/11/2020  [facility] Cardiologist: Jenne Campus, MD   Subjective  Brief HPI: 79 y.o. 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   Subjective: Patient is complaining of a HA that has been present since admission. No chest pain. Continues to have significant SOB on exertion.  Inpatient Medications    Scheduled Meds: . aspirin EC  81 mg Oral Daily  . buPROPion  300 mg Oral Daily  . carvedilol  3.125 mg Oral BID  . cholecalciferol  1,000 Units Oral Daily  . furosemide  20 mg Oral Daily  . isosorbide mononitrate  30 mg Oral Daily  . multivitamin with minerals  1 tablet Oral Daily  . pantoprazole  40 mg Oral Daily  . rosuvastatin  20 mg Oral q1800  . sacubitril-valsartan  1 tablet Oral BID  . sodium chloride flush  3 mL Intravenous Q12H  . tamsulosin  0.4 mg Oral Daily   Continuous Infusions: . sodium chloride    . sodium chloride    . sodium chloride 75 mL/hr at 07/11/20 0829  . bivalirudin (ANGIOMAX) infusion 0.5 mg/mL (Non-ACS indications) 0.15 mg/kg/hr (07/10/20 2151)   PRN Meds: sodium chloride, acetaminophen, ALPRAZolam, HYDROcodone-acetaminophen, loperamide, nitroGLYCERIN, ondansetron (ZOFRAN) IV, sodium chloride flush   Vital Signs    Vitals:   07/11/20 0023 07/11/20 0413 07/11/20 0530 07/11/20 0830  BP: 124/74 (!) 147/73  128/88  Pulse: 66 62    Resp: 20 18  18   Temp: 97.6 F (36.4 C) 98.4 F (36.9 C)  98.1 F (36.7 C)  TempSrc: Axillary Oral  Oral  SpO2: 95% 97%  95%  Weight:   133.2 kg   Height:        Intake/Output Summary (Last 24 hours) at 07/11/2020 0933 Last data filed at 07/11/2020 0000 Gross per 24 hour  Intake 1087.26 ml  Output 450 ml  Net 637.26 ml   Last 3 Weights 07/11/2020 07/09/2020 07/08/2020  Weight (lbs) 293 lb 10.4 oz 297 lb 2.9  oz 330 lb 11 oz  Weight (kg) 133.2 kg 134.8 kg 150 kg      Telemetry    Sinus with frequent PVCs - Personally Reviewed  ECG    07/08/20 NSR with inferior STD - Personally Reviewed  Physical Exam   GEN: Sitting up in bed, comfortable  Neck: No JVD Cardiac: RRR, no murmurs, rubs, or gallops.  Respiratory: Diminished breath sounds at the bases GI: Soft, nontender, non-distended  MS: No edema; No deformity. Neuro:  Nonfocal  Psych: Normal affect   Labs    High Sensitivity Troponin:   Recent Labs  Lab 07/08/20 2125 07/08/20 2317 07/09/20 0354 07/09/20 1038  TROPONINIHS 221* 187* 172* 157*      Chemistry Recent Labs  Lab 07/08/20 1411 07/08/20 1411 07/08/20 2125 07/09/20 0354 07/10/20 0110  NA 139  --  141 140 138  K 4.8   < > 4.6 4.2 4.0  CL 104   < > 107 107 104  CO2 24   < > 25 21* 26  GLUCOSE 159*  --  138* 123* 100*  BUN 17  --  17 18 16   CREATININE 1.23   < > 1.38* 1.29* 1.13  CALCIUM 9.2   < > 9.0 8.7* 8.8*  PROT  --   --   --   --  6.2*  ALBUMIN  --   --   --   --  3.5  AST  --   --   --   --  21  ALT  --   --   --   --  19  ALKPHOS  --   --   --   --  37*  BILITOT  --   --   --   --  0.8  GFRNONAA 56*  --  49* 53* >60  GFRAA 65  --   --   --   --   ANIONGAP  --   --  9 12 8    < > = values in this interval not displayed.     Hematology Recent Labs  Lab 07/09/20 0354 07/10/20 0110 07/11/20 0227  WBC 7.1 7.3 7.1  RBC 4.35 4.19* 4.19*  HGB 12.9* 12.5* 12.4*  HCT 41.8 38.2* 37.8*  MCV 96.1 91.2 90.2  MCH 29.7 29.8 29.6  MCHC 30.9 32.7 32.8  RDW 13.6 13.2 12.9  PLT 144* 148* 141*    BNP Recent Labs  Lab 07/08/20 1401  PROBNP 164     DDimer  Recent Labs  Lab 07/08/20 1401  DDIMER 1.65*     Radiology    ECHOCARDIOGRAM COMPLETE  Result Date: 07/10/2020    ECHOCARDIOGRAM REPORT   Patient Name:   Gary [name] Date of Exam: 07/10/2020 Medical Rec #:  [id number]              Height:       74.0 in Accession #:     [id number]             Weight:       297.2 lb Date of Birth:  [birth year]              BSA:          2.572 m Patient Age:    49 years               BP:           121/57 mmHg Patient Gender: M                      HR:           64 bpm. Exam Location:  Inpatient Procedure: 2D Echo, Cardiac Doppler and Color Doppler Indications:    Acute Coronary Syndrome I24.9  History:        Patient has prior history of Echocardiogram examinations, most                 recent 06/15/2020. CHF; Risk Factors:Hypertension.  Sonographer:    Bernadene Person RDCS Sonographer#2:  [place] Lefort RDCS (AE) Referring Phys: [id number] AVA SWAYZE IMPRESSIONS  1. Left ventricular ejection fraction, by estimation, is 40 to 45%. The left ventricle has mildly decreased function. Left ventricular endocardial border not optimally defined to evaluate regional wall motion despite use of Definity contrast. Global hypokinesis, possible inferior wall hypokinesis. The left ventricular internal cavity size was mildly dilated. There is mild left ventricular hypertrophy. Left ventricular diastolic parameters are consistent with Grade I diastolic dysfunction (impaired relaxation).  2. Right ventricular systolic function is mildly reduced. The right ventricular size is normal.  3. The mitral valve is normal in structure. Trivial mitral valve regurgitation. No evidence of mitral stenosis.  4. The aortic valve is grossly normal. Aortic valve regurgitation is not visualized. No aortic stenosis is present.  5.  Aortic dilatation noted. There is mild dilatation of the aortic root and of the ascending aorta, measuring 42 mm. Comparison(s): A prior study was performed on 06/15/20. Prior images reviewed side by side. LVEF appears to have decreased mildly from prior exam. Conclusion(s)/Recommendation(s): No left ventricular mural or apical thrombus/thrombi. FINDINGS  Left Ventricle: Left ventricular ejection fraction, by estimation, is 40 to 45%. The left ventricle has mildly decreased  function. Left ventricular endocardial border not optimally defined to evaluate regional wall motion. Definity contrast agent was given IV to delineate the left ventricular endocardial borders. The left ventricular internal cavity size was mildly dilated. There is mild left ventricular hypertrophy. Left ventricular diastolic parameters are consistent with Grade I diastolic dysfunction (impaired relaxation). Right Ventricle: The right ventricular size is normal. No increase in right ventricular wall thickness. Right ventricular systolic function is mildly reduced. Left Atrium: Left atrial size was normal in size. Right Atrium: Right atrial size was normal in size. Pericardium: There is no evidence of pericardial effusion. Mitral Valve: The mitral valve is normal in structure. Trivial mitral valve regurgitation. No evidence of mitral valve stenosis. Tricuspid Valve: The tricuspid valve is normal in structure. Tricuspid valve regurgitation is not demonstrated. No evidence of tricuspid stenosis. Aortic Valve: The aortic valve is grossly normal. Aortic valve regurgitation is not visualized. No aortic stenosis is present. Pulmonic Valve: The pulmonic valve was grossly normal. Pulmonic valve regurgitation is not visualized. No evidence of pulmonic stenosis. Aorta: Aortic dilatation noted. There is mild dilatation of the aortic root and of the ascending aorta, measuring 42 mm. Venous: The inferior vena cava was not well visualized. IAS/Shunts: The interatrial septum was not well visualized.  LEFT VENTRICLE PLAX 2D LVIDd:         5.90 cm  Diastology LVIDs:         3.90 cm  LV e' medial:    4.46 cm/s LV PW:         1.10 cm  LV E/e' medial:  13.8 LV IVS:        1.00 cm  LV e' lateral:   5.33 cm/s LVOT diam:     2.30 cm  LV E/e' lateral: 11.6 LV SV:         61 LV SV Index:   24 LVOT Area:     4.15 cm  RIGHT VENTRICLE RV S prime:     14.50 cm/s TAPSE (M-mode): 1.9 cm LEFT ATRIUM             Index       RIGHT ATRIUM            Index LA diam:        3.70 cm 1.44 cm/m  RA Area:     19.50 cm LA Vol (A2C):   76.1 ml 29.59 ml/m RA Volume:   51.80 ml  20.14 ml/m LA Vol (A4C):   63.0 ml 24.50 ml/m LA Biplane Vol: 70.7 ml 27.49 ml/m  AORTIC VALVE LVOT Vmax:   70.90 cm/s LVOT Vmean:  49.400 cm/s LVOT VTI:    0.147 m  AORTA Ao Root diam: 4.20 cm Ao Asc diam:  4.20 cm MITRAL VALVE MV Area (PHT): 2.66 cm    SHUNTS MV Decel Time: 285 msec    Systemic VTI:  0.15 m MV E velocity: 61.60 cm/s  Systemic Diam: 2.30 cm MV A velocity: 84.90 cm/s MV E/A ratio:  0.73 Cherlynn Kaiser MD Electronically signed by Cherlynn Kaiser MD Signature Date/Time: 07/10/2020/4:49:07 PM  Final     Cardiac Studies   TTE 07/10/20: 1. Left ventricular ejection fraction, by estimation, is 40 to 45%. The  left ventricle has mildly decreased function. Left ventricular endocardial  border not optimally defined to evaluate regional wall motion despite use  of Definity contrast. Global hypokinesis, possible inferior wall hypokinesis. The left ventricular  internal cavity size was mildly dilated. There is mild left ventricular hypertrophy. Left ventricular diastolic parameters are consistent with Grade I diastolic dysfunction (impaired  relaxation).  2. Right ventricular systolic function is mildly reduced. The right  ventricular size is normal.  3. The mitral valve is normal in structure. Trivial mitral valve  regurgitation. No evidence of mitral stenosis.  4. The aortic valve is grossly normal. Aortic valve regurgitation is not  visualized. No aortic stenosis is present.  5. Aortic dilatation noted. There is mild dilatation of the aortic root  and of the ascending aorta, measuring 42 mm.   FINDINGS: Cardiovascular: Thoracic aorta demonstrates atherosclerotic calcifications without aneurysmal dilatation or dissection. No cardiac enlargement is seen. The coronary arteries demonstrate diffuse calcifications. The pulmonary artery shows a  normal branching pattern without definitive filling defect to suggest pulmonary embolism.  Mediastinum/Nodes: Thoracic inlet is within normal limits. No sizable hilar or mediastinal adenopathy is noted. The esophagus as visualized is normal limits.  Lungs/Pleura: The lungs are well aerated bilaterally. No focal infiltrate or sizable effusion is seen. No pneumothorax or sizable parenchymal nodule is noted. Some stable perivascular nodularity is noted unchanged from 2017 consistent with a benign etiology.  Upper Abdomen: Visualized upper abdomen shows changes of prior cholecystectomy. An area of inflammatory changes noted in the subcutaneous tissues in an area of prior hypermetabolic nodule. A small lymph node is noted in the abdomen anterior to the stomach stable in appearance from the prior exam.  Musculoskeletal: Degenerative changes of the thoracic spine are noted.  Review of the MIP images confirms the above findings.  IMPRESSION: No evidence of pulmonary emboli.  Stable nodularity within the right middle lobe unchanged from 2017 consistent with a benign etiology.  Previously seen nodularity in the anterior abdominal wall is less prominent with a small fluid collection identified. Correlate with any recent excision in this region.  Aortic Atherosclerosis (ICD10-I70.0).  Patient Profile     80 y.o. 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   Assessment & Plan    #NSTEMI: Patient presented with chest pain found to have elevated troponin with STD in the inferior leads on ECG consistent with NSTEMI. 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 Has been on bival due to heparin allergy. -TTE with mildly reduced LVEF 40-45%; possible inferior wall hypokinesis -Plan for LHC today -Continue bival gtt for  [facility] -Continue ASA 81mg  daily -Start crestor 20mg  daily -Continue coreg -Continue entresto -Continue imdur  #Heart failure with mildly reduced LVEF (40-45%): Euvolemic on exam. NYHA class III symptoms. 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 Risks include but are not limited to bleeding, infection, vascular injury, stroke, myocardial infection,  arrhythmia, kidney injury, radiation-related injury in the case of prolonged fluoroscopy use, emergency cardiac surgery, and death. The patient understands the risks of serious complication is 1-2 in 3875 with diagnostic cardiac cath and 1-2% or less with angioplasty/stenting.       For questions or updates, please contact [place] Please consult [email or web address] for contact info under        Signed, Freada Bergeron, MD  07/11/2020, 9:33 AM

## 2020-07-11 NOTE — Interval H&P Note (Signed)
History and Physical Interval Note:  07/11/2020 2:29 PM  Gary [name]  has presented today for surgery, with the diagnosis of NSTEMI.  The various methods of treatment have been discussed with the patient and family. After consideration of risks, benefits and other options for treatment, the patient has consented to  Procedure(s): LEFT HEART CATH AND CORONARY ANGIOGRAPHY (N/A)  PERCUTANEOUS CORONARY INTERVENTION  as a surgical intervention.  The patient's history has been reviewed, patient examined, no change in status, stable for surgery.  I have reviewed the patient's chart and labs.  Questions were answered to the patient's satisfaction.    Cath Lab Visit (complete for each Cath Lab visit)  Clinical Evaluation Leading to the Procedure:   ACS: Yes.    Non-ACS:    Anginal Classification: CCS IV  Anti-ischemic medical therapy: Maximal Therapy (2 or more classes of medications)  Non-Invasive Test Results: No non-invasive testing performed  Prior CABG: No previous CABG   Glenetta Hew

## 2020-07-11 NOTE — Progress Notes (Addendum)
Progress Note  Patient Name: Gary [name] Date of Encounter: 07/11/2020  [facility] Cardiologist: Jenne Campus, MD   Subjective  Brief HPI: 79 y.o. 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   Subjective: Patient is complaining of a HA that has been present since admission. No chest pain. Continues to have significant SOB on exertion.  Inpatient Medications    Scheduled Meds: . aspirin EC  81 mg Oral Daily  . buPROPion  300 mg Oral Daily  . carvedilol  3.125 mg Oral BID  . cholecalciferol  1,000 Units Oral Daily  . furosemide  20 mg Oral Daily  . isosorbide mononitrate  30 mg Oral Daily  . multivitamin with minerals  1 tablet Oral Daily  . pantoprazole  40 mg Oral Daily  . rosuvastatin  20 mg Oral q1800  . sacubitril-valsartan  1 tablet Oral BID  . sodium chloride flush  3 mL Intravenous Q12H  . tamsulosin  0.4 mg Oral Daily   Continuous Infusions: . sodium chloride    . sodium chloride    . sodium chloride 75 mL/hr at 07/11/20 0829  . bivalirudin (ANGIOMAX) infusion 0.5 mg/mL (Non-ACS indications) 0.15 mg/kg/hr (07/10/20 2151)   PRN Meds: sodium chloride, acetaminophen, ALPRAZolam, HYDROcodone-acetaminophen, loperamide, nitroGLYCERIN, ondansetron (ZOFRAN) IV, sodium chloride flush   Vital Signs    Vitals:   07/11/20 0023 07/11/20 0413 07/11/20 0530 07/11/20 0830  BP: 124/74 (!) 147/73  128/88  Pulse: 66 62    Resp: 20 18  18   Temp: 97.6 F (36.4 C) 98.4 F (36.9 C)  98.1 F (36.7 C)  TempSrc: Axillary Oral  Oral  SpO2: 95% 97%  95%  Weight:   133.2 kg   Height:        Intake/Output Summary (Last 24 hours) at 07/11/2020 0933 Last data filed at 07/11/2020 0000 Gross per 24 hour  Intake 1087.26 ml  Output 450 ml  Net 637.26 ml   Last 3 Weights 07/11/2020 07/09/2020 07/08/2020  Weight (lbs) 293 lb 10.4 oz 297 lb 2.9  oz 330 lb 11 oz  Weight (kg) 133.2 kg 134.8 kg 150 kg      Telemetry    Sinus with frequent PVCs - Personally Reviewed  ECG    07/08/20 NSR with inferior STD - Personally Reviewed  Physical Exam   GEN: Sitting up in bed, comfortable  Neck: No JVD Cardiac: RRR, no murmurs, rubs, or gallops.  Respiratory: Diminished breath sounds at the bases GI: Soft, nontender, non-distended  MS: No edema; No deformity. Neuro:  Nonfocal  Psych: Normal affect   Labs    High Sensitivity Troponin:   Recent Labs  Lab 07/08/20 2125 07/08/20 2317 07/09/20 0354 07/09/20 1038  TROPONINIHS 221* 187* 172* 157*      Chemistry Recent Labs  Lab 07/08/20 1411 07/08/20 1411 07/08/20 2125 07/09/20 0354 07/10/20 0110  NA 139  --  141 140 138  K 4.8   < > 4.6 4.2 4.0  CL 104   < > 107 107 104  CO2 24   < > 25 21* 26  GLUCOSE 159*  --  138* 123* 100*  BUN 17  --  17 18 16   CREATININE 1.23   < > 1.38* 1.29* 1.13  CALCIUM 9.2   < > 9.0 8.7* 8.8*  PROT  --   --   --   --  6.2*  ALBUMIN  --   --   --   --  3.5  AST  --   --   --   --  21  ALT  --   --   --   --  19  ALKPHOS  --   --   --   --  37*  BILITOT  --   --   --   --  0.8  GFRNONAA 56*  --  49* 53* >60  GFRAA 65  --   --   --   --   ANIONGAP  --   --  9 12 8    < > = values in this interval not displayed.     Hematology Recent Labs  Lab 07/09/20 0354 07/10/20 0110 07/11/20 0227  WBC 7.1 7.3 7.1  RBC 4.35 4.19* 4.19*  HGB 12.9* 12.5* 12.4*  HCT 41.8 38.2* 37.8*  MCV 96.1 91.2 90.2  MCH 29.7 29.8 29.6  MCHC 30.9 32.7 32.8  RDW 13.6 13.2 12.9  PLT 144* 148* 141*    BNP Recent Labs  Lab 07/08/20 1401  PROBNP 164     DDimer  Recent Labs  Lab 07/08/20 1401  DDIMER 1.65*     Radiology    ECHOCARDIOGRAM COMPLETE  Result Date: 07/10/2020    ECHOCARDIOGRAM REPORT   Patient Name:   Gary [name] Date of Exam: 07/10/2020 Medical Rec #:  [id number]              Height:       74.0 in Accession #:     [id number]             Weight:       297.2 lb Date of Birth:  1941-04-24              BSA:          2.572 m Patient Age:    70 years               BP:           121/57 mmHg Patient Gender: M                      HR:           64 bpm. Exam Location:  Inpatient Procedure: 2D Echo, Cardiac Doppler and Color Doppler Indications:    Acute Coronary Syndrome I24.9  History:        Patient has prior history of Echocardiogram examinations, most                 recent 06/15/2020. CHF; Risk Factors:Hypertension.  Sonographer:    Bernadene Person RDCS Sonographer#2:  Clayton Lefort RDCS (AE) Referring Phys: [id number] AVA SWAYZE IMPRESSIONS  1. Left ventricular ejection fraction, by estimation, is 40 to 45%. The left ventricle has mildly decreased function. Left ventricular endocardial border not optimally defined to evaluate regional wall motion despite use of Definity contrast. Global hypokinesis, possible inferior wall hypokinesis. The left ventricular internal cavity size was mildly dilated. There is mild left ventricular hypertrophy. Left ventricular diastolic parameters are consistent with Grade I diastolic dysfunction (impaired relaxation).  2. Right ventricular systolic function is mildly reduced. The right ventricular size is normal.  3. The mitral valve is normal in structure. Trivial mitral valve regurgitation. No evidence of mitral stenosis.  4. The aortic valve is grossly normal. Aortic valve regurgitation is not visualized. No aortic stenosis is present.  5.  Aortic dilatation noted. There is mild dilatation of the aortic root and of the ascending aorta, measuring 42 mm. Comparison(s): A prior study was performed on 06/15/20. Prior images reviewed side by side. LVEF appears to have decreased mildly from prior exam. Conclusion(s)/Recommendation(s): No left ventricular mural or apical thrombus/thrombi. FINDINGS  Left Ventricle: Left ventricular ejection fraction, by estimation, is 40 to 45%. The left ventricle has mildly decreased  function. Left ventricular endocardial border not optimally defined to evaluate regional wall motion. Definity contrast agent was given IV to delineate the left ventricular endocardial borders. The left ventricular internal cavity size was mildly dilated. There is mild left ventricular hypertrophy. Left ventricular diastolic parameters are consistent with Grade I diastolic dysfunction (impaired relaxation). Right Ventricle: The right ventricular size is normal. No increase in right ventricular wall thickness. Right ventricular systolic function is mildly reduced. Left Atrium: Left atrial size was normal in size. Right Atrium: Right atrial size was normal in size. Pericardium: There is no evidence of pericardial effusion. Mitral Valve: The mitral valve is normal in structure. Trivial mitral valve regurgitation. No evidence of mitral valve stenosis. Tricuspid Valve: The tricuspid valve is normal in structure. Tricuspid valve regurgitation is not demonstrated. No evidence of tricuspid stenosis. Aortic Valve: The aortic valve is grossly normal. Aortic valve regurgitation is not visualized. No aortic stenosis is present. Pulmonic Valve: The pulmonic valve was grossly normal. Pulmonic valve regurgitation is not visualized. No evidence of pulmonic stenosis. Aorta: Aortic dilatation noted. There is mild dilatation of the aortic root and of the ascending aorta, measuring 42 mm. Venous: The inferior vena cava was not well visualized. IAS/Shunts: The interatrial septum was not well visualized.  LEFT VENTRICLE PLAX 2D LVIDd:         5.90 cm  Diastology LVIDs:         3.90 cm  LV e' medial:    4.46 cm/s LV PW:         1.10 cm  LV E/e' medial:  13.8 LV IVS:        1.00 cm  LV e' lateral:   5.33 cm/s LVOT diam:     2.30 cm  LV E/e' lateral: 11.6 LV SV:         61 LV SV Index:   24 LVOT Area:     4.15 cm  RIGHT VENTRICLE RV S prime:     14.50 cm/s TAPSE (M-mode): 1.9 cm LEFT ATRIUM             Index       RIGHT ATRIUM            Index LA diam:        3.70 cm 1.44 cm/m  RA Area:     19.50 cm LA Vol (A2C):   76.1 ml 29.59 ml/m RA Volume:   51.80 ml  20.14 ml/m LA Vol (A4C):   63.0 ml 24.50 ml/m LA Biplane Vol: 70.7 ml 27.49 ml/m  AORTIC VALVE LVOT Vmax:   70.90 cm/s LVOT Vmean:  49.400 cm/s LVOT VTI:    0.147 m  AORTA Ao Root diam: 4.20 cm Ao Asc diam:  4.20 cm MITRAL VALVE MV Area (PHT): 2.66 cm    SHUNTS MV Decel Time: 285 msec    Systemic VTI:  0.15 m MV E velocity: 61.60 cm/s  Systemic Diam: 2.30 cm MV A velocity: 84.90 cm/s MV E/A ratio:  0.73 Cherlynn Kaiser MD Electronically signed by Cherlynn Kaiser MD Signature Date/Time: 07/10/2020/4:49:07 PM  Final     Cardiac Studies   TTE 07/10/20: 1. Left ventricular ejection fraction, by estimation, is 40 to 45%. The  left ventricle has mildly decreased function. Left ventricular endocardial  border not optimally defined to evaluate regional wall motion despite use  of Definity contrast. Global hypokinesis, possible inferior wall hypokinesis. The left ventricular  internal cavity size was mildly dilated. There is mild left ventricular hypertrophy. Left ventricular diastolic parameters are consistent with Grade I diastolic dysfunction (impaired  relaxation).  2. Right ventricular systolic function is mildly reduced. The right  ventricular size is normal.  3. The mitral valve is normal in structure. Trivial mitral valve  regurgitation. No evidence of mitral stenosis.  4. The aortic valve is grossly normal. Aortic valve regurgitation is not  visualized. No aortic stenosis is present.  5. Aortic dilatation noted. There is mild dilatation of the aortic root  and of the ascending aorta, measuring 42 mm.   FINDINGS: Cardiovascular: Thoracic aorta demonstrates atherosclerotic calcifications without aneurysmal dilatation or dissection. No cardiac enlargement is seen. The coronary arteries demonstrate diffuse calcifications. The pulmonary artery shows a  normal branching pattern without definitive filling defect to suggest pulmonary embolism.  Mediastinum/Nodes: Thoracic inlet is within normal limits. No sizable hilar or mediastinal adenopathy is noted. The esophagus as visualized is normal limits.  Lungs/Pleura: The lungs are well aerated bilaterally. No focal infiltrate or sizable effusion is seen. No pneumothorax or sizable parenchymal nodule is noted. Some stable perivascular nodularity is noted unchanged from 2017 consistent with a benign etiology.  Upper Abdomen: Visualized upper abdomen shows changes of prior cholecystectomy. An area of inflammatory changes noted in the subcutaneous tissues in an area of prior hypermetabolic nodule. A small lymph node is noted in the abdomen anterior to the stomach stable in appearance from the prior exam.  Musculoskeletal: Degenerative changes of the thoracic spine are noted.  Review of the MIP images confirms the above findings.  IMPRESSION: No evidence of pulmonary emboli.  Stable nodularity within the right middle lobe unchanged from 2017 consistent with a benign etiology.  Previously seen nodularity in the anterior abdominal wall is less prominent with a small fluid collection identified. Correlate with any recent excision in this region.  Aortic Atherosclerosis (ICD10-I70.0).  Patient Profile     79 y.o. 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   Assessment & Plan    #NSTEMI: Patient presented with chest pain found to have elevated troponin with STD in the inferior leads on ECG consistent with NSTEMI. 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 Has been on bival due to heparin allergy. -TTE with mildly reduced LVEF 40-45%; possible inferior wall hypokinesis -Plan for [facility] today -Continue bival gtt for  [facility] -Continue ASA 81mg  daily -Start crestor 20mg  daily -Continue coreg -Continue entresto -Continue imdur  #Heart failure with mildly reduced LVEF (40-45%): Euvolemic on exam. NYHA class III symptoms. 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 Risks include but are not limited to bleeding, infection, vascular injury, stroke, myocardial infection,  arrhythmia, kidney injury, radiation-related injury in the case of prolonged fluoroscopy use, emergency cardiac surgery, and death. The patient understands the risks of serious complication is 1-2 in 1916 with diagnostic cardiac cath and 1-2% or less with angioplasty/stenting.       For questions or updates, please contact [place] Please consult [email or web address] for contact info under        Signed, Freada Bergeron, MD  07/11/2020, 9:33 AM

## 2020-07-11 NOTE — Progress Notes (Signed)
PROGRESS NOTE  Gary [name] LNL:[id number] DOB: [birth year] DOA: 07/08/2020 PCP: Bonnita Nasuti, MD  Brief History   The patient is a 79 yr old man who presented to [facility] on 07/08/2020 with complaints of chest pain. He carries a past medical history significant for diastolic CHF, hypertension, DDD lower back, osteoarthritis, and BPH. He states that he has had 3-4 episodes of chest pain over the past few days. It is described as aa heaviness/pressure/tightness and is not exertional. It is located in the sternally to left precordial with radiation to left shoulder and upper arm. It is not associated with nausea or diaphoresis. He does have chronic DOE that is unchanged from baseline. 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 He does not have PAD, DM, or family history of CAD.  In the ED the patient was found to have elevated troponins without EKG changes.   [facility] hospitalists were consulted to admit the patient for further evaluation and treatment. Cardiology was consulted.  Plan is for the patient to go for [facility] later today per cardiology. He has been NPO after midnight.  Consultants  . Cardiology  Procedures  . None  Antibiotics   Anti-infectives (From admission, onward)   None     Subjective  The patient is resting comfortably. He states that he has had no further chest pain.  Objective   Vitals:  Vitals:   07/11/20 0830 07/11/20 1140  BP: 128/88 109/70  Pulse: 76 68  Resp: 18   Temp: 98.1 F (36.7 C) 98.1 F (36.7 C)  SpO2: 95% 97%   Exam:  Constitutional:  . The patient is awake, alert, and oriented x 3. No acute distress. Respiratory:  . No increased work of breathing. . No wheezes, rales, or rhonchi . No tactile fremitus Cardiovascular:  . Regular rate and rhythm . No murmurs, ectopy, or gallups. . No lateral PMI. No thrills. Abdomen:  . Abdomen is soft,  non-tender, non-distended . No hernias, masses, or organomegaly . Normoactive bowel sounds.  Musculoskeletal:  . No cyanosis, clubbing, or edema Skin:  . No rashes, lesions, ulcers . palpation of skin: no induration or nodules Neurologic:  . CN 2-12 intact . Sensation all 4 extremities intact Psychiatric:  . Mental status o Mood, affect appropriate o Orientation to person, place, time  . judgment and insight appear intact  I have personally reviewed the following:   Today's Data  . Vitals, CBC,   Imaging  . CXR . CTA chest  Cardiology Data  . EKG . Echocardiogram  Scheduled Meds: . aspirin EC  81 mg Oral Daily  . buPROPion  300 mg Oral Daily  . carvedilol  3.125 mg Oral BID  . cholecalciferol  1,000 Units Oral Daily  . furosemide  20 mg Oral Daily  . isosorbide mononitrate  30 mg Oral Daily  . multivitamin with minerals  1 tablet Oral Daily  . pantoprazole  40 mg Oral Daily  . rosuvastatin  20 mg Oral q1800  . sacubitril-valsartan  1 tablet Oral BID  . sodium chloride flush  3 mL Intravenous Q12H  . tamsulosin  0.4 mg Oral Daily   Continuous Infusions: . sodium chloride    . sodium chloride    . sodium chloride 75 mL/hr at 07/11/20 0829  . bivalirudin (ANGIOMAX) infusion 0.5 mg/mL (Non-ACS indications) Stopped (07/11/20 1345)    Principal Problem:   NSTEMI (non-ST elevated myocardial infarction) (  [place]) Active Problems:   Diastolic congestive heart failure ([place])   Essential hypertension   AKI (acute kidney injury) ([place])   LOS: 2 days    A & P  NSTEMI (non-ST elevated myocardial infarction) [facility]):  Mr. Stukey is admitted to cardiac telemetry with NSTEMI.  Obtain serial troponin levels.  Cardiology has been consulted by the ER physician and cardiology has seen in consultation. Antiplatelet therapy with aspirin daily.  Check lipid panel.  Monitor blood pressure.  Continue home medications of Coreg, Entresto.  Use nitroglycerin as needed for chest pain.   Supplemental oxygen as needed to maintain O2 sat between 92-96% Plan is for the patient to go to [facility] later today, 07/11/2020.  Loose BM: 3 yesterday. Monitor for diarrhea. Low suspicion for infectious enteritis. None further.  Diastolic congestive heart failure ([place]): Chronic.  Continue home medications as listed above.  Continue Lasix daily.  Essential hypertension: Monitor blood pressure.  Continue home medication  AKI (acute kidney injury) ([place]): Creatinine improved with hydration. Continue IV fluids.  I have seen and examined this patient myself. I have spent 32 minutes in his evaluation and care.  DVT Prophylaxis: Bivalrudin CODE STATUS: Full Code Family Communication: None available Disposition:  Status is: Inpatient  Remains inpatient appropriate because:Ongoing diagnostic testing needed not appropriate for outpatient work up   Dispo: The patient is from: Home              Anticipated d/c is to: Home              Anticipated d/c date is: 2 days              Patient currently is not medically stable to d/c.  Akshaya Toepfer, DO [facility] Direct contact: see [email or web address]  7PM-7AM contact night coverage as above 07/11/2020, 2:27 PM  LOS: 1 day

## 2020-07-12 ENCOUNTER — Encounter (HOSPITAL_COMMUNITY): Payer: Self-pay | Admitting: Cardiology

## 2020-07-12 ENCOUNTER — Inpatient Hospital Stay (HOSPITAL_COMMUNITY): Payer: Medicare Other

## 2020-07-12 DIAGNOSIS — I214 Non-ST elevation (NSTEMI) myocardial infarction: Secondary | ICD-10-CM | POA: Diagnosis not present

## 2020-07-12 LAB — BASIC METABOLIC PANEL
Anion gap: 11 (ref 5–15)
BUN: 12 mg/dL (ref 8–23)
CO2: 25 mmol/L (ref 22–32)
Calcium: 9 mg/dL (ref 8.9–10.3)
Chloride: 101 mmol/L (ref 98–111)
Creatinine, Ser: 1.03 mg/dL (ref 0.61–1.24)
GFR, Estimated: >60 mL/min (ref >60)
Glucose, Bld: 132 mg/dL — ABNORMAL HIGH (ref 70–99)
Potassium: 4 mmol/L (ref 3.5–5.1)
Sodium: 137 mmol/L (ref 135–145)

## 2020-07-12 LAB — CBC
HCT: 38.8 % — ABNORMAL LOW (ref 39.0–52.0)
Hemoglobin: 12.7 g/dL — ABNORMAL LOW (ref 13.0–17.0)
MCH: 29.5 pg (ref 26.0–34.0)
MCHC: 32.7 g/dL (ref 30.0–36.0)
MCV: 90.2 fL (ref 80.0–100.0)
Platelets: 132 10*3/uL — ABNORMAL LOW (ref 150–400)
RBC: 4.3 MIL/uL (ref 4.22–5.81)
RDW: 13 % (ref 11.5–15.5)
WBC: 7.8 10*3/uL (ref 4.0–10.5)
nRBC: 0 % (ref 0.0–0.2)

## 2020-07-12 LAB — GLUCOSE, CAPILLARY: Glucose-Capillary: 197 mg/dL — ABNORMAL HIGH (ref 70–99)

## 2020-07-12 MED ORDER — SODIUM CHLORIDE 0.9 % IV BOLUS
250.0000 mL | Freq: Once | INTRAVENOUS | Status: AC
  Administered 2020-07-12: 250 mL via INTRAVENOUS

## 2020-07-12 MED ORDER — CARVEDILOL 6.25 MG PO TABS: 6.2500 mg | ORAL_TABLET | Freq: Two times a day (BID) | ORAL | Status: DC

## 2020-07-12 MED ORDER — SODIUM CHLORIDE 0.9 % IV SOLN: INTRAVENOUS | Status: DC

## 2020-07-12 MED ORDER — HYDROCODONE-ACETAMINOPHEN 10-325 MG PO TABS
1.0000 | ORAL_TABLET | Freq: Once | ORAL | Status: AC
  Administered 2020-07-12: 1 via ORAL
  Filled 2020-07-12: qty 1

## 2020-07-12 MED ORDER — SACUBITRIL-VALSARTAN 24-26 MG PO TABS
1.0000 | ORAL_TABLET | Freq: Two times a day (BID) | ORAL | Status: DC
  Administered 2020-07-13: 1 via ORAL
  Filled 2020-07-12: qty 1

## 2020-07-12 MED ORDER — METOPROLOL TARTRATE 25 MG PO TABS
25.0000 mg | ORAL_TABLET | Freq: Two times a day (BID) | ORAL | Status: DC
Start: 2020-07-12 — End: 2020-07-12

## 2020-07-12 MED ORDER — CARVEDILOL 3.125 MG PO TABS
3.1250 mg | ORAL_TABLET | Freq: Two times a day (BID) | ORAL | Status: DC
  Administered 2020-07-13: 3.125 mg via ORAL
  Filled 2020-07-12: qty 1

## 2020-07-12 MED FILL — Heparin Sod (Porcine)-NaCl IV Soln 1000 Unit/500ML-0.9%: INTRAVENOUS | Qty: 500 | Status: AC

## 2020-07-12 NOTE — Progress Notes (Signed)
Progress Note  Patient Name: Gary [name] Date of Encounter: 07/12/2020  [facility] Cardiologist: Jenne Campus, MD   Subjective  Patient states his HA is much improved but is having some back pain from laying on the cath lab table yesterday.  Cath revealed a Mid-distal LCx culprit lesion with 85% stenosis s/p successful PCI. Had diffuse multivessel disease with 60% prox and mid LAD, 60% ramus, 15% RCA.  Inpatient Medications    Scheduled Meds: . aspirin EC  81 mg Oral Daily  . buPROPion  300 mg Oral Daily  . carvedilol  6.25 mg Oral BID  . cholecalciferol  1,000 Units Oral Daily  . furosemide  20 mg Oral Daily  . HYDROcodone-acetaminophen  1 tablet Oral Once  . isosorbide mononitrate  30 mg Oral Daily  . multivitamin with minerals  1 tablet Oral Daily  . pantoprazole  40 mg Oral Daily  . sacubitril-valsartan  1 tablet Oral BID  . sodium chloride flush  3 mL Intravenous Q12H  . sodium chloride flush  3 mL Intravenous Q12H  . tamsulosin  0.4 mg Oral Daily  . ticagrelor  90 mg Oral BID   Continuous Infusions: . sodium chloride 75 mL/hr at 07/11/20 0829  . sodium chloride     PRN Meds: sodium chloride, acetaminophen, ALPRAZolam, HYDROcodone-acetaminophen, HYDROmorphone (DILAUDID) injection, loperamide, nitroGLYCERIN, ondansetron (ZOFRAN) IV, sodium chloride flush   Vital Signs    Vitals:   07/11/20 2058 07/12/20 0042 07/12/20 0408 07/12/20 0808  BP: (!) 145/59 (!) 117/98 116/81 (!) 149/78  Pulse: 63 67 62 66  Resp: 18 18 18 18   Temp: 98.4 F (36.9 C) 98.7 F (37.1 C) 98.4 F (36.9 C) 98.2 F (36.8 C)  TempSrc: Oral Oral Oral Oral  SpO2: 99%  97% 97%  Weight:      Height:        Intake/Output Summary (Last 24 hours) at 07/12/2020 0911 Last data filed at 07/12/2020 6213 Gross per 24 hour  Intake --  Output 2800 ml  Net -2800 ml   Last 3 Weights 07/11/2020 07/09/2020 07/08/2020  Weight (lbs) 293 lb 10.4 oz 297 lb 2.9 oz 330 lb 11 oz   Weight (kg) 133.2 kg 134.8 kg 150 kg      Telemetry    Sinus rhythm. Occasional PVCs- Personally Reviewed  ECG    Sinus brady, diffuse T-wave flattening - Personally Reviewed  Physical Exam   GEN: Comfortable, NAD  Neck: No JVD Cardiac: RRR, no murmurs, rubs, or gallops. Right radial access site c/d/i Respiratory: CTAB GI: Soft, nontender, non-distended  MS: No edema; No deformity. Neuro:  Nonfocal  Psych: Normal affect   Labs    High Sensitivity Troponin:   Recent Labs  Lab 07/08/20 2125 07/08/20 2317 07/09/20 0354 07/09/20 1038  TROPONINIHS 221* 187* 172* 157*      Chemistry Recent Labs  Lab 07/08/20 1411 07/08/20 2125 07/09/20 0354 07/10/20 0110 07/12/20 0636  NA 139   < > 140 138 137  K 4.8   < > 4.2 4.0 4.0  CL 104   < > 107 104 101  CO2 24   < > 21* 26 25  GLUCOSE 159*   < > 123* 100* 132*  BUN 17   < > 18 16 12   CREATININE 1.23   < > 1.29* 1.13 1.03  CALCIUM 9.2   < > 8.7* 8.8* 9.0  PROT  --   --   --  6.2*  --  ALBUMIN  --   --   --  3.5  --   AST  --   --   --  21  --   ALT  --   --   --  19  --   ALKPHOS  --   --   --  37*  --   BILITOT  --   --   --  0.8  --   GFRNONAA 56*   < > 53* >60 >60  GFRAA 65  --   --   --   --   ANIONGAP  --    < > 12 8 11    < > = values in this interval not displayed.     Hematology Recent Labs  Lab 07/10/20 0110 07/11/20 0227 07/12/20 0636  WBC 7.3 7.1 7.8  RBC 4.19* 4.19* 4.30  HGB 12.5* 12.4* 12.7*  HCT 38.2* 37.8* 38.8*  MCV 91.2 90.2 90.2  MCH 29.8 29.6 29.5  MCHC 32.7 32.8 32.7  RDW 13.2 12.9 13.0  PLT 148* 141* 132*    BNP Recent Labs  Lab 07/08/20 1401  PROBNP 164     DDimer  Recent Labs  Lab 07/08/20 1401  DDIMER 1.65*     Radiology    CARDIAC CATHETERIZATION  Result Date: 07/11/2020  Mid Cx to Dist Cx lesion is 85% stenosed.  A drug-eluting stent was successfully placed using a STENT RESOLUTE ONYX 2.5X12. Postdilated 2.65 mm  Post intervention, there is a 0% residual  stenosis.  -------------  Colon Flattery LM lesion is 25% stenosed.  Mid LM to Dist LM lesion is 25% stenosed with 60% stenosed side branch in Ost Cx to Prox Cx. This involves the ostium of Ramus and 1st Mrg  Ramus lesion is 60% stenosed. 1st Mrg lesion is 40% stenosed.  Prox LAD lesion is 60% stenosed -discrete eccentric lesion  Mid LAD lesion is 60% stenosed -> focal concentric lesion in the bend  Prox RCA lesion is 15% stenosed.  SUMMARY  Multivessel CAD:  Clear CULPRIT LESION noted as 85% ulcerated mLCx(AVG Cx), -->  Successful DES PCI reducing to 0%: Resolute Onyx DES 2.5 mm x 12 mm - post-dilated to 2.65 mm  Tifurcation AVG Cx-RI-1stMrg with each branch ~50-60%, prox LAD focal /eccentric ~60%,  mid LAD short ~60-65% concentric lesion.  Normal LVEDP - Echo showed mildly reduced LVEF with Inferolateral WMA. RECOMMENDATIONS  Transfer to [place] post PCI.  Continue to treat residual disease medically to allow recovery from recent surgery. Glenetta Hew, MD  ECHOCARDIOGRAM COMPLETE  Result Date: 07/10/2020    ECHOCARDIOGRAM REPORT   Patient Name:   Gary [name] Date of Exam: 07/10/2020 Medical Rec #:  [id number]              Height:       74.0 in Accession #:    [id number]             Weight:       297.2 lb Date of Birth:  1941/02/22              BSA:          2.572 m Patient Age:    79 years               BP:           121/57 mmHg Patient Gender: M  HR:           64 bpm. Exam Location:  Inpatient Procedure: 2D Echo, Cardiac Doppler and Color Doppler Indications:    Acute Coronary Syndrome I24.9  History:        Patient has prior history of Echocardiogram examinations, most                 recent 06/15/2020. CHF; Risk Factors:Hypertension.  Sonographer:    Bernadene Person RDCS Sonographer#2:  Clayton Lefort RDCS (AE) Referring Phys: [id number] AVA SWAYZE IMPRESSIONS  1. Left ventricular ejection fraction, by estimation, is 40 to 45%. The left ventricle has mildly decreased function. Left  ventricular endocardial border not optimally defined to evaluate regional wall motion despite use of Definity contrast. Global hypokinesis, possible inferior wall hypokinesis. The left ventricular internal cavity size was mildly dilated. There is mild left ventricular hypertrophy. Left ventricular diastolic parameters are consistent with Grade I diastolic dysfunction (impaired relaxation).  2. Right ventricular systolic function is mildly reduced. The right ventricular size is normal.  3. The mitral valve is normal in structure. Trivial mitral valve regurgitation. No evidence of mitral stenosis.  4. The aortic valve is grossly normal. Aortic valve regurgitation is not visualized. No aortic stenosis is present.  5. Aortic dilatation noted. There is mild dilatation of the aortic root and of the ascending aorta, measuring 42 mm. Comparison(s): A prior study was performed on 06/15/20. Prior images reviewed side by side. LVEF appears to have decreased mildly from prior exam. Conclusion(s)/Recommendation(s): No left ventricular mural or apical thrombus/thrombi. FINDINGS  Left Ventricle: Left ventricular ejection fraction, by estimation, is 40 to 45%. The left ventricle has mildly decreased function. Left ventricular endocardial border not optimally defined to evaluate regional wall motion. Definity contrast agent was given IV to delineate the left ventricular endocardial borders. The left ventricular internal cavity size was mildly dilated. There is mild left ventricular hypertrophy. Left ventricular diastolic parameters are consistent with Grade I diastolic dysfunction (impaired relaxation). Right Ventricle: The right ventricular size is normal. No increase in right ventricular wall thickness. Right ventricular systolic function is mildly reduced. Left Atrium: Left atrial size was normal in size. Right Atrium: Right atrial size was normal in size. Pericardium: There is no evidence of pericardial effusion. Mitral Valve:  The mitral valve is normal in structure. Trivial mitral valve regurgitation. No evidence of mitral valve stenosis. Tricuspid Valve: The tricuspid valve is normal in structure. Tricuspid valve regurgitation is not demonstrated. No evidence of tricuspid stenosis. Aortic Valve: The aortic valve is grossly normal. Aortic valve regurgitation is not visualized. No aortic stenosis is present. Pulmonic Valve: The pulmonic valve was grossly normal. Pulmonic valve regurgitation is not visualized. No evidence of pulmonic stenosis. Aorta: Aortic dilatation noted. There is mild dilatation of the aortic root and of the ascending aorta, measuring 42 mm. Venous: The inferior vena cava was not well visualized. IAS/Shunts: The interatrial septum was not well visualized.  LEFT VENTRICLE PLAX 2D LVIDd:         5.90 cm  Diastology LVIDs:         3.90 cm  LV e' medial:    4.46 cm/s LV PW:         1.10 cm  LV E/e' medial:  13.8 LV IVS:        1.00 cm  LV e' lateral:   5.33 cm/s LVOT diam:     2.30 cm  LV E/e' lateral: 11.6 LV SV:  61 LV SV Index:   24 LVOT Area:     4.15 cm  RIGHT VENTRICLE RV S prime:     14.50 cm/s TAPSE (M-mode): 1.9 cm LEFT ATRIUM             Index       RIGHT ATRIUM           Index LA diam:        3.70 cm 1.44 cm/m  RA Area:     19.50 cm LA Vol (A2C):   76.1 ml 29.59 ml/m RA Volume:   51.80 ml  20.14 ml/m LA Vol (A4C):   63.0 ml 24.50 ml/m LA Biplane Vol: 70.7 ml 27.49 ml/m  AORTIC VALVE LVOT Vmax:   70.90 cm/s LVOT Vmean:  49.400 cm/s LVOT VTI:    0.147 m  AORTA Ao Root diam: 4.20 cm Ao Asc diam:  4.20 cm MITRAL VALVE MV Area (PHT): 2.66 cm    SHUNTS MV Decel Time: 285 msec    Systemic VTI:  0.15 m MV E velocity: 61.60 cm/s  Systemic Diam: 2.30 cm MV A velocity: 84.90 cm/s MV E/A ratio:  0.73 Cherlynn Kaiser MD Electronically signed by Cherlynn Kaiser MD Signature Date/Time: 07/10/2020/4:49:07 PM    Final     Cardiac Studies   LHC 07/11/20:  Mid Cx to Dist Cx lesion is 85% stenosed.  A  drug-eluting stent was successfully placed using a STENT RESOLUTE ONYX 2.5X12. Postdilated 2.65 mm  Post intervention, there is a 0% residual stenosis.  -------------  Colon Flattery LM lesion is 25% stenosed.  Mid LM to Dist LM lesion is 25% stenosed with 60% stenosed side branch in Ost Cx to Prox Cx. This involves the ostium of Ramus and 1st Mrg  Ramus lesion is 60% stenosed. 1st Mrg lesion is 40% stenosed.  Prox LAD lesion is 60% stenosed -discrete eccentric lesion  Mid LAD lesion is 60% stenosed -> focal concentric lesion in the bend  Prox RCA lesion is 15% stenosed.   SUMMARY  Multivessel CAD:  ? Clear CULPRIT LESION noted as 85% ulcerated mLCx(AVG Cx), -->   Successful DES PCI reducing to 0%: Resolute Onyx DES 2.5 mm x 12 mm - post-dilated to 2.65 mm ? Tifurcation AVG Cx-RI-1stMrg with each branch ~50-60%, prox LAD focal /eccentric ~60%,  ? mid LAD short ~60-65% concentric lesion.  Normal LVEDP - Echo showed mildly reduced LVEF with Inferolateral WMA.   RECOMMENDATIONS  Transfer to [place] post PCI.  Continue to treat residual disease medically to allow recovery from recent surgery.    TTE 07/10/20: 1. Left ventricular ejection fraction, by estimation, is 40 to 45%. The  left ventricle has mildly decreased function. Left ventricular endocardial  border not optimally defined to evaluate regional wall motion despite use  of Definity contrast. Global hypokinesis, possible inferior wall hypokinesis. The left ventricular  internal cavity size was mildly dilated. There is mild left ventricular hypertrophy. Left ventricular diastolic parameters are consistent with Grade I diastolic dysfunction (impaired  relaxation).  2. Right ventricular systolic function is mildly reduced. The right  ventricular size is normal.  3. The mitral valve is normal in structure. Trivial mitral valve  regurgitation. No evidence of mitral stenosis.  4. The aortic valve is grossly normal. Aortic valve  regurgitation is not  visualized. No aortic stenosis is present.  5. Aortic dilatation noted. There is mild dilatation of the aortic root  and of the ascending aorta, measuring 42 mm.   FINDINGS: Cardiovascular: Thoracic  aorta demonstrates atherosclerotic calcifications without aneurysmal dilatation or dissection. No cardiac enlargement is seen. The coronary arteries demonstrate diffuse calcifications. The pulmonary artery shows a normal branching pattern without definitive filling defect to suggest pulmonary embolism.  Mediastinum/Nodes: Thoracic inlet is within normal limits. No sizable hilar or mediastinal adenopathy is noted. The esophagus as visualized is normal limits.  Lungs/Pleura: The lungs are well aerated bilaterally. No focal infiltrate or sizable effusion is seen. No pneumothorax or sizable parenchymal nodule is noted. Some stable perivascular nodularity is noted unchanged from 2017 consistent with a benign etiology.  Upper Abdomen: Visualized upper abdomen shows changes of prior cholecystectomy. An area of inflammatory changes noted in the subcutaneous tissues in an area of prior hypermetabolic nodule. A small lymph node is noted in the abdomen anterior to the stomach stable in appearance from the prior exam.  Musculoskeletal: Degenerative changes of the thoracic spine are noted.  Review of the MIP images confirms the above findings.  IMPRESSION: No evidence of pulmonary emboli.  Stable nodularity within the right middle lobe unchanged from 2017 consistent with a benign etiology.  Previously seen nodularity in the anterior abdominal wall is less prominent with a small fluid collection identified. Correlate with any recent excision in this region.  Aortic Atherosclerosis (ICD10-I70.0).  Patient Profile     79 y.o. 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   Assessment & Plan    #NSTEMI: Patient presented with NSTEMI found to have mid-distal LCx lesion as well as diffuse multivessel disease with 60% prox and mid LAD and 60% ramus. Decision was made to treat culprit and continue aggressive medical management of other lesions. -S/p successful PCI to mid-distal LCx lesion with no residual stenosis -Will pursue aggressive medical management of multivessel CAD and if fails, may need further work-up of LAD and Ramus lesions  -TTE with mildly reduced LVEF 40-45%; possible inferior wall hypokinesis -Continue ASA 81mg  daily -Start crestor 20mg  daily -Up-titrate coreg to 6.25mg  BID -Continue entresto 24/26mg  -Continue imdur 30mg  daily; may need to discontinue if has persistent, severe HA -Plan to discharge home today on medical therapy with close follow-up with primary Cardiologist  #Heart failure with mildly reduced LVEF (40-45%): Euvolemic on exam.  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

## 2020-07-12 NOTE — Progress Notes (Signed)
Pt c/o SOB and lightheaded, attending and cardiology paged.  Orders received for orthostatic BP, results as follows: Lying BP - 116/71, MAP - 86, HR - 62 Sitting BP - 129/76, MAP - 90, HR - 63 Standing BP - 93/57, MAP - 78, HR - 81

## 2020-07-12 NOTE — Progress Notes (Addendum)
   Received page from RN with reports that patient complaining of shortness of breath and lightheadedness. It was initially thought that this may be from [place] so patient was given some caffeine and food but symptoms persisted. Will check chest x-ray and orthostatic vital signs.   Darreld Mclean, PA-C 07/12/2020 12:59 PM   Addendum:  Orthostatics positive. Chest x-ray showed improved aeration with decreased bibasilar atelectasis.  Discussed with Dr. Johney Frame. Will hold evening dose of Coreg and Enresto. Will reduce Coreg back to 3.125mg  twice daily starting tomorrow. Will also give 250 bolus of normal saline.   Darreld Mclean, PA-C 07/12/2020 3:13 PM

## 2020-07-12 NOTE — Progress Notes (Signed)
CARDIAC REHAB PHASE I   PRE:  Rate/Rhythm: 72 SR  BP:  Sitting: 136/79      SaO2: 97 RA  MODE:  Ambulation: 300 ft   POST:  Rate/Rhythm: 95 SR  BP:  Sitting: 132/85    SaO2: 95 RA   Pt ambulated 373ft in hallway standby assist with steady gait. Pt c/o some SOB, states he has not been able to walk this far for "some time now". MI and stent ed completed with pt. Pt educated on importance of ASA, Brilinta, and NTG. Pt given stent card, MI book, and heart healthy diet. Reviewed restrictions, site care, and exercise guidelines. Will refer to CRP II [place].  [id number] Rufina Falco, RN BSN 07/12/2020 9:56 AM

## 2020-07-12 NOTE — Progress Notes (Signed)
PROGRESS NOTE  Corben Auzenne AQT:[id number] DOB: [birth year] DOA: 07/08/2020 PCP: Bonnita Nasuti, MD  Brief History   The patient is a 79 yr old man who presented to [facility] on 07/08/2020 with complaints of chest pain. He carries a past medical history significant for diastolic CHF, hypertension, DDD lower back, osteoarthritis, and BPH. He states that he has had 3-4 episodes of chest pain over the past few days. It is described as aa heaviness/pressure/tightness and is not exertional. It is located in the sternally to left precordial with radiation to left shoulder and upper arm. It is not associated with nausea or diaphoresis. He does have chronic DOE that is unchanged from baseline. 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 He does not have PAD, DM, or family history of CAD.  In the ED the patient was found to have elevated troponins without EKG changes.   [facility] hospital[facility] were consulted to admit the patient for further evaluation and treatment. Cardiology was consulted.   Plan is for the patient went for [facility] on 07/11/2020 per cardiology. this demonstrated: Multivessel CAD:  ? Clear CULPRIT LESION noted as 85% ulcerated mLCx(AVG Cx), -->   Successful DES PCI reducing to 0%: Resolute Onyx DES 2.5 mm x 12 mm - post-dilated to 2.65 mm ? Tifurcation AVG Cx-RI-1stMrg with each branch ~50-60%, prox LAD focal /eccentric ~60%,  ? mid LAD short ~60-65% concentric lesion.  Normal LVEDP - Echo showed mildly reduced LVEF with Inferolateral WMA.  RECOMMENDATIONS  Transfer to [place] post PCI.  Continue to treat residual disease medically to allow recovery from recent surgery.  The patient was cleared for discharge this morning. However, the patient has been having complaints of lightheadedness and dyspnea today. He was found to be orthostatic. He has been given a bolus. Plan on discharging in the  am.  Consultants  . Cardiology  Procedures  . None  Antibiotics   Anti-infectives (From admission, onward)   None     Subjective  The patient is resting comfortably. He is complaining of dyspnea and lightheadedness.  Objective   Vitals:  Vitals:   07/12/20 1416 07/12/20 1601  BP: (!) 93/57 129/78  Pulse: 81 (!) 58  Resp:  18  Temp:  98.1 F (36.7 C)  SpO2:  99%   Exam:  Constitutional:  . The patient is awake, alert, and oriented x 3. No acute distress. Respiratory:  . No increased work of breathing. . No wheezes, rales, or rhonchi . No tactile fremitus Cardiovascular:  . Regular rate and rhythm . No murmurs, ectopy, or gallups. . No lateral PMI. No thrills. Abdomen:  . Abdomen is soft, non-tender, non-distended . No hernias, masses, or organomegaly . Normoactive bowel sounds.  Musculoskeletal:  . No cyanosis, clubbing, or edema Skin:  . No rashes, lesions, ulcers . palpation of skin: no induration or nodules Neurologic:  . CN 2-12 intact . Sensation all 4 extremities intact Psychiatric:  . Mental status o Mood, affect appropriate o Orientation to person, place, time  . judgment and insight appear intact  I have personally reviewed the following:   Today's Data  . Vitals, CBC, BMP  Imaging  . CXR . CTA chest  Cardiology Data  . EKG . Echocardiogram  Scheduled Meds: . aspirin EC  81 mg Oral Daily  . buPROPion  300 mg Oral Daily  . [START ON 07/13/2020] carvedilol  3.125 mg Oral BID  .  cholecalciferol  1,000 Units Oral Daily  . furosemide  20 mg Oral Daily  . isosorbide mononitrate  30 mg Oral Daily  . multivitamin with minerals  1 tablet Oral Daily  . pantoprazole  40 mg Oral Daily  . [START ON 07/13/2020] sacubitril-valsartan  1 tablet Oral BID  . sodium chloride flush  3 mL Intravenous Q12H  . sodium chloride flush  3 mL Intravenous Q12H  . tamsulosin  0.4 mg Oral Daily  . ticagrelor  90 mg Oral BID   Continuous Infusions: .  sodium chloride 75 mL/hr at 07/11/20 0829  . sodium chloride Stopped (07/12/20 1538)    Principal Problem:   NSTEMI (non-ST elevated myocardial infarction) ([place]) Active Problems:   Diastolic congestive heart failure (HCC)   Essential hypertension   AKI (acute kidney injury) ([place])   LOS: 3 days    A & P   NSTEMI (non-ST elevated myocardial infarction) [facility]):  Mr. Nabor is admitted to cardiac telemetry with NSTEMI.  Obtain serial troponin levels.  Cardiology has been consulted by the ER physician and cardiology has seen in consultation. Antiplatelet therapy with aspirin daily.  Check lipid panel.  Monitor blood pressure.  Continue home medications of Coreg, Entresto.  Use nitroglycerin as needed for chest pain.  Supplemental oxygen as needed to maintain O2 sat between 92-96%. LHC performed on 07/11/2020 demonstrated:   Multivessel CAD:  ? Clear CULPRIT LESION noted as 85% ulcerated mLCx(AVG Cx), -->   Successful DES PCI reducing to 0%: Resolute Onyx DES 2.5 mm x 12 mm - post-dilated to 2.65 mm ? Tifurcation AVG Cx-RI-1stMrg with each branch ~50-60%, prox LAD focal /eccentric ~60%,  ? mid LAD short ~60-65% concentric lesion.  Normal LVEDP - Echo showed mildly reduced LVEF with Inferolateral WMA.   RECOMMENDATIONS  Transfer to [place] post PCI.  Continue to treat residual disease medically to allow recovery from recent surgery.  Orthostasis: The patient has received an IV bolus. We will recheck orthostatics in the morning. If positive will check cosyntropin stim test.  Loose BM: 3 yesterday. Monitor for diarrhea. Low suspicion for infectious enteritis. None further.  Diastolic congestive heart failure ([place]): Chronic.  Continue home medications as listed above.  Will hold lasix.  Essential hypertension: Monitor blood pressure.  Continue home medication  AKI (acute kidney injury) ([place]): Creatinine improved with hydration.   I have seen and examined this patient myself. I have  spent 35 minutes in his evaluation and care.  DVT Prophylaxis: Bivalrudin CODE STATUS: Full Code Family Communication: None available Disposition:  Status is: Inpatient  Remains inpatient appropriate because:Ongoing diagnostic testing needed not appropriate for outpatient work up   Dispo: The patient is from: Home              Anticipated d/c is to: Home              Anticipated d/c date is: 1 day              Patient currently is not medically stable to d/c.  Oaklynn Stierwalt, DO [facility] Direct contact: see [email or web address]  7PM-7AM contact night coverage as above 07/12/2020, 5:55 PM  LOS: 1 day

## 2020-07-12 NOTE — TOC Benefit Eligibility Note (Signed)
Transition of Care [facility]) Benefit Eligibility Note    Patient Details  Name: Gary [name] MRN: [id number] Date of Birth: [birth year]   Medication/Dose: Kary Kos 90mg . bid for 30 day supply  Covered?: Yes  Tier: 3 Drug  Prescription Coverage Preferred Pharmacy: [facility]  Spoke with Person/Company/Phone Number:: Sofi C. Montross PH# [phone]  Co-Pay: $47.00  Prior Approval: No  Deductible: Unmet       Shelda Altes Phone Number: 07/12/2020, 1:05 PM

## 2020-07-13 ENCOUNTER — Other Ambulatory Visit (HOSPITAL_COMMUNITY): Payer: Self-pay | Admitting: Student

## 2020-07-13 DIAGNOSIS — I5042 Chronic combined systolic (congestive) and diastolic (congestive) heart failure: Secondary | ICD-10-CM

## 2020-07-13 DIAGNOSIS — I214 Non-ST elevation (NSTEMI) myocardial infarction: Secondary | ICD-10-CM | POA: Diagnosis not present

## 2020-07-13 DIAGNOSIS — E669 Obesity, unspecified: Secondary | ICD-10-CM | POA: Diagnosis not present

## 2020-07-13 DIAGNOSIS — I951 Orthostatic hypotension: Secondary | ICD-10-CM | POA: Diagnosis not present

## 2020-07-13 LAB — CBC
HCT: 38.7 % — ABNORMAL LOW (ref 39.0–52.0)
Hemoglobin: 12.8 g/dL — ABNORMAL LOW (ref 13.0–17.0)
MCH: 29.9 pg (ref 26.0–34.0)
MCHC: 33.1 g/dL (ref 30.0–36.0)
MCV: 90.4 fL (ref 80.0–100.0)
Platelets: 152 10*3/uL (ref 150–400)
RBC: 4.28 MIL/uL (ref 4.22–5.81)
RDW: 13 % (ref 11.5–15.5)
WBC: 8 10*3/uL (ref 4.0–10.5)
nRBC: 0 % (ref 0.0–0.2)

## 2020-07-13 LAB — COMPREHENSIVE METABOLIC PANEL
ALT: 19 U/L (ref 0–44)
AST: 24 U/L (ref 15–41)
Albumin: 3.8 g/dL (ref 3.5–5.0)
Alkaline Phosphatase: 43 U/L (ref 38–126)
Anion gap: 11 (ref 5–15)
BUN: 14 mg/dL (ref 8–23)
CO2: 25 mmol/L (ref 22–32)
Calcium: 9.2 mg/dL (ref 8.9–10.3)
Chloride: 102 mmol/L (ref 98–111)
Creatinine, Ser: 1.2 mg/dL (ref 0.61–1.24)
GFR, Estimated: 58 mL/min — ABNORMAL LOW (ref >60)
Glucose, Bld: 123 mg/dL — ABNORMAL HIGH (ref 70–99)
Potassium: 3.8 mmol/L (ref 3.5–5.1)
Sodium: 138 mmol/L (ref 135–145)
Total Bilirubin: 1.5 mg/dL — ABNORMAL HIGH (ref 0.3–1.2)
Total Protein: 6.9 g/dL (ref 6.5–8.1)

## 2020-07-13 MED ORDER — FUROSEMIDE 20 MG PO TABS: 20.0000 mg | ORAL_TABLET | Freq: Every day | ORAL | 0 refills | Status: DC

## 2020-07-13 MED ORDER — ATORVASTATIN CALCIUM 20 MG PO TABS: 20.0000 mg | ORAL_TABLET | Freq: Every day | ORAL | 1 refills | Status: DC

## 2020-07-13 MED ORDER — PANTOPRAZOLE SODIUM 40 MG PO TBEC
40.0000 mg | DELAYED_RELEASE_TABLET | Freq: Every day | ORAL | 1 refills | Status: DC
Start: 2020-07-14 — End: 2020-10-17

## 2020-07-13 MED ORDER — TICAGRELOR 90 MG PO TABS
90.0000 mg | ORAL_TABLET | Freq: Two times a day (BID) | ORAL | 1 refills | Status: DC
Start: 2020-07-13 — End: 2020-10-17

## 2020-07-13 MED FILL — PANTOPRAZOLE SOD DR 40 MG T: 40 | 30 days supply | Qty: 30 | Fill #0

## 2020-07-13 MED FILL — BRILINTA 90 MG TABLET: 90 | 30 days supply | Qty: 60 | Fill #0

## 2020-07-13 MED FILL — FUROSEMIDE 20 MG TAB: 20 | 90 days supply | Qty: 90 | Fill #0

## 2020-07-13 MED FILL — ATORVASTATIN CALCIUM 20 MG: 20 | 30 days supply | Qty: 30 | Fill #0

## 2020-07-13 NOTE — Progress Notes (Signed)
Pt's stable, CP free, DC home via wheelchair. RN went over DC instructions with pt's girlfriend.

## 2020-07-13 NOTE — Progress Notes (Addendum)
Progress Note  Patient Name: Gary [name] Date of Encounter: 07/13/2020  [facility] Cardiologist: Jenne Campus, MD   Subjective  Patient states he "feels the best he has since his admission." Had bad HA last night but improved this morning. Orthostatics positive (mildly) but asymptomatic.  Patient with lightheadedness yesterday. Orthostatics positive in the setting of being NPO for long time the day prior for cath. Received IVF and home BP medications/lasix held. BP up to 150s this morning. Repeat orthostatics pending.  Also with some SOB on exertion yesterday. CXR without pulmonary edema and with interval improvement of atelectasis.  Inpatient Medications    Scheduled Meds: . aspirin EC  81 mg Oral Daily  . buPROPion  300 mg Oral Daily  . carvedilol  3.125 mg Oral BID  . cholecalciferol  1,000 Units Oral Daily  . furosemide  20 mg Oral Daily  . isosorbide mononitrate  30 mg Oral Daily  . multivitamin with minerals  1 tablet Oral Daily  . pantoprazole  40 mg Oral Daily  . sacubitril-valsartan  1 tablet Oral BID  . sodium chloride flush  3 mL Intravenous Q12H  . sodium chloride flush  3 mL Intravenous Q12H  . tamsulosin  0.4 mg Oral Daily  . ticagrelor  90 mg Oral BID   Continuous Infusions: . sodium chloride 75 mL/hr at 07/11/20 0829  . sodium chloride Stopped (07/12/20 1538)   PRN Meds: sodium chloride, acetaminophen, ALPRAZolam, HYDROcodone-acetaminophen, HYDROmorphone (DILAUDID) injection, loperamide, nitroGLYCERIN, ondansetron (ZOFRAN) IV, sodium chloride flush   Vital Signs    Vitals:   07/13/20 0746 07/13/20 0751 07/13/20 0752 07/13/20 0755  BP: (!) 183/80 (!) 174/94 (!) 152/91 (!) 158/98  Pulse:      Resp:      Temp:      TempSrc:      SpO2:      Weight:      Height:        Intake/Output Summary (Last 24 hours) at 07/13/2020 0759 Last data filed at 07/12/2020 1837 Gross per 24 hour  Intake 251.29 ml  Output 650 ml  Net -398.71 ml     Last 3 Weights 07/11/2020 07/09/2020 07/08/2020  Weight (lbs) 293 lb 10.4 oz 297 lb 2.9 oz 330 lb 11 oz  Weight (kg) 133.2 kg 134.8 kg 150 kg      Telemetry    Sinus rhythm. Occasional PVCs- Personally Reviewed  ECG    Sinus brady, diffuse T-wave flattening - Personally Reviewed  Physical Exam   GEN: Comfortable, NAD  Neck: No JVD Cardiac: RRR, no murmurs Respiratory: Clear bilaterally GI: Obese, soft, NTTP  MS: Warm, no edema Neuro:  Nonfocal  Psych: Normal affect   Labs    High Sensitivity Troponin:   Recent Labs  Lab 07/08/20 2125 07/08/20 2317 07/09/20 0354 07/09/20 1038  TROPONINIHS 221* 187* 172* 157*      Chemistry Recent Labs  Lab 07/08/20 1411 07/08/20 2125 07/10/20 0110 07/12/20 0636 07/13/20 0327  NA 139   < > 138 137 138  K 4.8   < > 4.0 4.0 3.8  CL 104   < > 104 101 102  CO2 24   < > 26 25 25   GLUCOSE 159*   < > 100* 132* 123*  BUN 17   < > 16 12 14   CREATININE 1.23   < > 1.13 1.03 1.20  CALCIUM 9.2   < > 8.8* 9.0 9.2  PROT  --   --  6.2*  --  6.9  ALBUMIN  --   --  3.5  --  3.8  AST  --   --  21  --  24  ALT  --   --  19  --  19  ALKPHOS  --   --  37*  --  43  BILITOT  --   --  0.8  --  1.5*  GFRNONAA 56*   < > >60 >60 58*  GFRAA 65  --   --   --   --   ANIONGAP  --    < > 8 11 11    < > = values in this interval not displayed.     Hematology Recent Labs  Lab 07/11/20 0227 07/12/20 0636 07/13/20 0327  WBC 7.1 7.8 8.0  RBC 4.19* 4.30 4.28  HGB 12.4* 12.7* 12.8*  HCT 37.8* 38.8* 38.7*  MCV 90.2 90.2 90.4  MCH 29.6 29.5 29.9  MCHC 32.8 32.7 33.1  RDW 12.9 13.0 13.0  PLT 141* 132* 152    BNP Recent Labs  Lab 07/08/20 1401  PROBNP 164     DDimer  Recent Labs  Lab 07/08/20 1401  DDIMER 1.65*     Radiology    CARDIAC CATHETERIZATION  Result Date: 07/11/2020  Mid Cx to Dist Cx lesion is 85% stenosed.  A drug-eluting stent was successfully placed using a STENT RESOLUTE ONYX 2.5X12. Postdilated 2.65 mm  Post  intervention, there is a 0% residual stenosis.  -------------  Colon Flattery LM lesion is 25% stenosed.  Mid LM to Dist LM lesion is 25% stenosed with 60% stenosed side branch in Ost Cx to Prox Cx. This involves the ostium of Ramus and 1st Mrg  Ramus lesion is 60% stenosed. 1st Mrg lesion is 40% stenosed.  Prox LAD lesion is 60% stenosed -discrete eccentric lesion  Mid LAD lesion is 60% stenosed -> focal concentric lesion in the bend  Prox RCA lesion is 15% stenosed.  SUMMARY  Multivessel CAD:  Clear CULPRIT LESION noted as 85% ulcerated mLCx(AVG Cx), -->  Successful DES PCI reducing to 0%: Resolute Onyx DES 2.5 mm x 12 mm - post-dilated to 2.65 mm  Tifurcation AVG Cx-RI-1stMrg with each branch ~50-60%, prox LAD focal /eccentric ~60%,  mid LAD short ~60-65% concentric lesion.  Normal LVEDP - Echo showed mildly reduced LVEF with Inferolateral WMA. RECOMMENDATIONS  Transfer to [place] post PCI.  Continue to treat residual disease medically to allow recovery from recent surgery. Glenetta Hew, MD  DG CHEST PORT 1 VIEW  Result Date: 07/12/2020 CLINICAL DATA:  Short of breath EXAM: PORTABLE CHEST 1 VIEW COMPARISON:  07/08/2020 FINDINGS: Improved aeration in the lung bases with decreased atelectasis bilaterally. Heart size and vascularity normal. Negative for heart failure. No pleural effusion. IMPRESSION: Improved aeration with decreased bibasilar atelectasis. Electronically Signed   By: Franchot Gallo M.D.   On: 07/12/2020 14:00    Cardiac Studies   LHC 07/11/20:  Mid Cx to Dist Cx lesion is 85% stenosed.  A drug-eluting stent was successfully placed using a STENT RESOLUTE ONYX 2.5X12. Postdilated 2.65 mm  Post intervention, there is a 0% residual stenosis.  -------------  Colon Flattery LM lesion is 25% stenosed.  Mid LM to Dist LM lesion is 25% stenosed with 60% stenosed side branch in Ost Cx to Prox Cx. This involves the ostium of Ramus and 1st Mrg  Ramus lesion is 60% stenosed. 1st Mrg lesion is 40%  stenosed.  Prox LAD lesion is 60% stenosed -discrete eccentric lesion  Mid LAD lesion is 60% stenosed -> focal concentric lesion in the bend  Prox RCA lesion is 15% stenosed.   SUMMARY  Multivessel CAD:  ? Clear CULPRIT LESION noted as 85% ulcerated mLCx(AVG Cx), -->   Successful DES PCI reducing to 0%: Resolute Onyx DES 2.5 mm x 12 mm - post-dilated to 2.65 mm ? Tifurcation AVG Cx-RI-1stMrg with each branch ~50-60%, prox LAD focal /eccentric ~60%,  ? mid LAD short ~60-65% concentric lesion.  Normal LVEDP - Echo showed mildly reduced LVEF with Inferolateral WMA.   RECOMMENDATIONS  Transfer to [place] post PCI.  Continue to treat residual disease medically to allow recovery from recent surgery.    TTE 07/10/20: 1. Left ventricular ejection fraction, by estimation, is 40 to 45%. The  left ventricle has mildly decreased function. Left ventricular endocardial  border not optimally defined to evaluate regional wall motion despite use  of Definity contrast. Global hypokinesis, possible inferior wall hypokinesis. The left ventricular  internal cavity size was mildly dilated. There is mild left ventricular hypertrophy. Left ventricular diastolic parameters are consistent with Grade I diastolic dysfunction (impaired  relaxation).  2. Right ventricular systolic function is mildly reduced. The right  ventricular size is normal.  3. The mitral valve is normal in structure. Trivial mitral valve  regurgitation. No evidence of mitral stenosis.  4. The aortic valve is grossly normal. Aortic valve regurgitation is not  visualized. No aortic stenosis is present.  5. Aortic dilatation noted. There is mild dilatation of the aortic root  and of the ascending aorta, measuring 42 mm.   FINDINGS: Cardiovascular: Thoracic aorta demonstrates atherosclerotic calcifications without aneurysmal dilatation or dissection. No cardiac enlargement is seen. The coronary arteries demonstrate diffuse  calcifications. The pulmonary artery shows a normal branching pattern without definitive filling defect to suggest pulmonary embolism.  Mediastinum/Nodes: Thoracic inlet is within normal limits. No sizable hilar or mediastinal adenopathy is noted. The esophagus as visualized is normal limits.  Lungs/Pleura: The lungs are well aerated bilaterally. No focal infiltrate or sizable effusion is seen. No pneumothorax or sizable parenchymal nodule is noted. Some stable perivascular nodularity is noted unchanged from 2017 consistent with a benign etiology.  Upper Abdomen: Visualized upper abdomen shows changes of prior cholecystectomy. An area of inflammatory changes noted in the subcutaneous tissues in an area of prior hypermetabolic nodule. A small lymph node is noted in the abdomen anterior to the stomach stable in appearance from the prior exam.  Musculoskeletal: Degenerative changes of the thoracic spine are noted.  Review of the MIP images confirms the above findings.  IMPRESSION: No evidence of pulmonary emboli.  Stable nodularity within the right middle lobe unchanged from 2017 consistent with a benign etiology.  Previously seen nodularity in the anterior abdominal wall is less prominent with a small fluid collection identified. Correlate with any recent excision in this region.  Aortic Atherosclerosis (ICD10-I70.0).  Patient Profile     79 y.o. 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   Assessment & Plan    #NSTEMI: Patient presented with NSTEMI found to have mid-distal LCx lesion as well as diffuse multivessel disease with 60% prox and mid LAD and 60% ramus. Decision was made to treat culprit and continue aggressive medical management of other lesions. -S/p successful PCI to mid-distal LCx lesion with no residual stenosis -Will pursue  aggressive medical management of multivessel CAD and if fails,  may need further work-up of LAD and Ramus lesions in future if symptoms persist -TTE with mildly reduced LVEF 40-45%; possible inferior wall hypokinesis -Continue ASA 81mg  daily -Continue ticagrelor 90mg  BID-->if SOB continues and is bothersome, may need to change to plavix -Continue crestor 20mg  daily -Continue coreg 3.125mg  daily given orthostatic yesterday; can up-titrate as out-patient -Resume home entresto 24/26mg  this AM  -Continue imdur 30mg  daily; may need to discontinue if has persistent, severe HA -Plan to discharge home today on medical therapy if patient continues to feel improved  #Heart failure with mildly reduced LVEF (40-45%): Euvolemic on exam.  -S/p LHC with successful PCI to LCx as detailed above -Resume lasix 20mg  PO daily on discharge to begin tomorrow -Continue coreg to 3.125mg  BID-->can up-titrate as out-patient -Resume entresto 24-26mg  daily  -Monitor I/Os and daily weights -Low Na diet  #Orthostatic hypotension: Patient orthostatic yesterday and symptomatic. Likely from prolonged NPO for cath the day prior. Improved symptoms this morning. 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 Will arrange for cardiology follow-up.   For questions or updates, please contact [place] Please consult [email or web address] for contact info under        Signed, Freada Bergeron, MD  07/13/2020, 7:58 AM

## 2020-07-13 NOTE — Discharge Instructions (Signed)
Radial Site Care  This sheet gives you information about how to care for yourself after your procedure. Your health care provider may also give you more specific instructions. If you have problems or questions, contact your health care provider. What can I expect after the procedure? After the procedure, it is common to have:  Bruising and tenderness at the catheter insertion area. Follow these instructions at home: Medicines  Take over-the-counter and prescription medicines only as told by your health care provider. Insertion site care  Follow instructions from your health care provider about how to take care of your insertion site. Make sure you: ? Wash your hands with soap and water before you change your bandage (dressing). If soap and water are not available, use hand sanitizer. ? Change your dressing as told by your health care provider. ? Leave stitches (sutures), skin glue, or adhesive strips in place. These skin closures may need to stay in place for 2 weeks or longer. If adhesive strip edges start to loosen and curl up, you may trim the loose edges. Takeru Bose not remove adhesive strips completely unless your health care provider tells you to Lecretia Buczek that.  Check your insertion site every day for signs of infection. Check for: ? Redness, swelling, or pain. ? Fluid or blood. ? Pus or a bad smell. ? Warmth.  Kinsie Belford not take baths, swim, or use a hot tub until your health care provider approves.  You may shower 24-48 hours after the procedure, or as directed by your health care provider. ? Remove the dressing and gently wash the site with plain soap and water. ? Pat the area dry with a clean towel. ? Alante Weimann not rub the site. That could cause bleeding.  Shada Nienaber not apply powder or lotion to the site. Activity   For 24 hours after the procedure, or as directed by your health care provider: ? Katanya Schlie not flex or bend the affected arm. ? Caine Barfield not push or pull heavy objects with the affected arm. ? Liz Pinho not  drive yourself home from the hospital or clinic. You may drive 24 hours after the procedure unless your health care provider tells you not to. ? Anarely Nicholls not operate machinery or power tools.  Takeru Bose not lift anything that is heavier than 10 lb (4.5 kg), or the limit that you are told, until your health care provider says that it is safe.  Ask your health care provider when it is okay to: ? Return to work or school. ? Resume usual physical activities or sports. ? Resume sexual activity. General instructions  If the catheter site starts to bleed, raise your arm and put firm pressure on the site. If the bleeding does not stop, get help right away. This is a medical emergency.  If you went home on the same day as your procedure, a responsible adult should be with you for the first 24 hours after you arrive home.  Keep all follow-up visits as told by your health care provider. This is important. Contact a health care provider if:  You have a fever.  You have redness, swelling, or yellow drainage around your insertion site. Get help right away if:  You have unusual pain at the radial site.  The catheter insertion area swells very fast.  The insertion area is bleeding, and the bleeding does not stop when you hold steady pressure on the area.  Your arm or hand becomes pale, cool, tingly, or numb. These symptoms may represent a serious problem   that is an emergency. Bland Rudzinski not wait to see if the symptoms will go away. Get medical help right away. Call your local emergency services (911 in the [place] Shannette Tabares not drive yourself to the hospital. Summary  After the procedure, it is common to have bruising and tenderness at the site.  Follow instructions from your health care provider about how to take care of your radial site wound. Check the wound every day for signs of infection.  Pattrick Bady not lift anything that is heavier than 10 lb (4.5 kg), or the limit that you are told, until your health care provider says  that it is safe. This information is not intended to replace advice given to you by your health care provider. Make sure you discuss any questions you have with your health care provider. Document Revised: 10/16/2017 Document Reviewed: 10/16/2017 [employer] Patient Education  2020 [employer]  

## 2020-07-13 NOTE — Discharge Summary (Addendum)
Physician Discharge Summary  Gary [name] WER:[id number] DOB: [birth year] DOA: 07/08/2020  PCP: Bonnita Nasuti, MD  Admit date: 07/08/2020 Discharge date: 07/13/2020  Admitted From: Home Disposition: Home  Recommendations for Outpatient Follow-up:  1. Follow ups as below. 2. Please obtain CBC/BMP/Mag at follow up 3. Please follow up on the following pending results: None  Home Health: None required Equipment/Devices: None required  Discharge Condition: Stable CODE STATUS: Full code   Follow-up Information    Park Liter, MD Follow up.   Specialty: Cardiology Why: Hospital follow-up scheduled for 07/29/2020 at 10:00am. Please arrive 15 minutes early for check-in. If this date/time does not work for you, please call our office to reschedule. Contact information: [place] [address] [phone]        Bonnita Nasuti, MD. Schedule an appointment as soon as possible for a visit in 1 week(s).   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  In ED, he had elevated troponin without EKG changes.  Cardiology consulted. TTE with EF of 40 to 45%, G1 DD, global hypokinesis, mildly reduced RV SF and mild aortic dilation to 42 mm. 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  He had PCI with DES to ulcerated mLCx.  He was started on DAPT with aspirin and Brilinta.  Patient had some orthostatic hypotension with symptoms.  Cardiac meds adjusted by cardiology.  Eventually his symptoms resolved although he is orthostatic vitals remained positive with SBP dropping with standing.   On the day of discharge, his Coreg and Delene Loll resumed per  cardiology recommendations.  He tolerated dose prior to admission.  He was cleared for discharge by cardiology to follow-up with his own cardiologist as above.  He was advised to resume his Lasix in 2 days as recommended by cardiology  See individual problem list below for more on hospital course.  Discharge Diagnoses:  Non-STEMI-s/p LHC with successful DES PCI to mLCx on 07/11/2020.  Chest pain resolved. LDL 55. -Cleared for discharge on Coreg, Entresto, Imdur, Brilinta, aspirin, statin and Lasix as below. -Cardiology follow-up as above -Check basic labs at follow-up.  Chronic Combined CHF: TTE with EF of 40 to 45%, G1 DD, global hypokinesis, mildly reduced RV SF and mild aortic dilation to 42 mm.  Appears euvolemic.  Has chronic DOE unchanged from baseline.  Weight 293 pounds on discharge.  -Diuretics and GDMT as above -Assess fluid status and renal function at follow-up  Orthostatic hypotension: Improved after holding cardiac meds and IV fluid.  Although orthostatic vitals positive by SBP, he was asymptomatic on the day of discharge.  He tolerated the above cardiac meds  Essential hypertension: Normotensive -Antihypertensive meds as above.  AKI: Cr 1.59 on admit>> 1.03> 1.20. -Recheck renal function at follow-up  Mild hyperbilirubinemia: Total bili 1.5. -Recheck at follow-up  Chronic lower back pain/OA/DDD: Stable -Advised to avoid OTC pain meds except plain Tylenol.  Tobacco use disorder: He quit smoking in 1990s but uses chewable tobacco. -Encourage cessation  BPH without LUTS -Continue home Flomax  Anxiety/mood disorder: Stable -Continue home meds.  GERD: -Continue PPI  Class II obesity Body mass index is 37.7 kg/m.  -  Encourage lifestyle change to lose weight.          Discharge Exam: Vitals:   07/13/20 0752 07/13/20 0755  BP: (!) 152/91 (!) 158/98  Pulse:    Resp:    Temp:    SpO2:      GENERAL: No apparent distress.  Nontoxic. HEENT: MMM.  Vision  and hearing grossly intact.  NECK: Supple.  No apparent JVD.  RESP: On RA.  No IWOB.  Fair aeration bilaterally. CVS:  RRR. Heart sounds normal.  ABD/GI/GU: Bowel sounds present. Soft. Non tender.  MSK/EXT:  Moves extremities. No apparent deformity. No edema.  SKIN: no apparent skin lesion or wound NEURO: Awake, alert and oriented appropriately.  No apparent focal neuro deficit. PSYCH: Calm. Normal affect.   Discharge Instructions  Discharge Instructions    Amb Referral to Cardiac Rehabilitation   Complete by: As directed    Will send Cardiac Rehab Phase 2 referral to [place]   Diagnosis:  Coronary Stents NSTEMI     After initial evaluation and assessments completed: Virtual Based Care may be provided alone or in conjunction with Phase 2 Cardiac Rehab based on patient barriers.: Yes   Call MD for:  difficulty breathing, headache or visual disturbances   Complete by: As directed    Call MD for:  extreme fatigue   Complete by: As directed    Call MD for:  persistant dizziness or light-headedness   Complete by: As directed    Call MD for:  persistant nausea and vomiting   Complete by: As directed    Call MD for:  severe uncontrolled pain   Complete by: As directed    Call MD for:  temperature >100.4   Complete by: As directed    Diet - low sodium heart healthy   Complete by: As directed    Discharge instructions   Complete by: As directed    It has been a pleasure taking care of you!  You were hospitalized due to chest pain due to blood vessel blockage in your heart.  You were treated surgically and medically for this.  Your chest pain resolved.  We are discharging you on some new medications in addition to your old medications.  Please review your new medication lists and the directions on your medications before you take them.  Please avoid any over-the-counter pain medication other than plain Tylenol while taking Brilinta. You may resume your Lasix in 2 days if no longer feel  dizzy or lightheaded.  Please go to your hospital follow-up appointments or call to schedule as recommended.   Take care,   Increase activity slowly   Complete by: As directed      Allergies as of 07/13/2020      Reactions   Pork-derived Products Anaphylaxis   Amoxicillin Rash      Medication List    STOP taking these medications   omeprazole 40 MG capsule Commonly known as: PRILOSEC Replaced by: pantoprazole 40 MG tablet   sulfamethoxazole-trimethoprim 800-160 MG tablet Commonly known as: BACTRIM DS     TAKE these medications   ALPRAZolam 0.5 MG tablet Commonly known as: XANAX Take 0.5 mg by mouth 3 (three) times daily as needed for anxiety.   aspirin EC 81 MG tablet Take 1 tablet (81 mg total) by mouth daily. Swallow whole.   atorvastatin 20 MG tablet Commonly known as: Lipitor Take 1 tablet (20 mg total) by mouth daily.   buPROPion 300 MG 24 hr tablet Commonly known as:  WELLBUTRIN XL Take 300 mg by mouth in the morning and at bedtime.   carvedilol 3.125 MG tablet Commonly known as: COREG Take 3.125 mg by mouth 2 (two) times daily.   cholecalciferol 25 MCG (1000 UNIT) tablet Commonly known as: VITAMIN D3 Take 1,000 Units by mouth daily.   Entresto 24-26 MG Generic drug: sacubitril-valsartan Take 1 tablet by mouth daily.   Fish Oil 1000 MG Caps Take 1 capsule by mouth daily.   furosemide 20 MG tablet Commonly known as: LASIX Take 1 tablet (20 mg total) by mouth daily. Start taking on: July 15, 2020 What changed: These instructions start on July 15, 2020. If you are unsure what to do until then, ask your doctor or other care provider.   HYDROcodone-acetaminophen 10-325 MG tablet Commonly known as: NORCO Take 1 tablet by mouth 3 (three) times daily as needed for moderate pain.   isosorbide mononitrate 30 MG 24 hr tablet Commonly known as: IMDUR Take 1 tablet (30 mg total) by mouth daily.   montelukast 10 MG tablet Commonly known as:  SINGULAIR Take 10 mg by mouth daily.   multivitamin tablet Take 1 tablet by mouth daily.   nitroGLYCERIN 0.4 MG SL tablet Commonly known as: NITROSTAT Place 1 tablet (0.4 mg total) under the tongue every 5 (five) minutes as needed for chest pain.   ondansetron 4 MG tablet Commonly known as: ZOFRAN Take 1 tablet by mouth 2 (two) times daily as needed for nausea or vomiting.   pantoprazole 40 MG tablet Commonly known as: PROTONIX Take 1 tablet (40 mg total) by mouth daily. Start taking on: July 14, 2020 Replaces: omeprazole 40 MG capsule   tamsulosin 0.4 MG Caps capsule Commonly known as: FLOMAX Take 0.4 mg by mouth daily.   ticagrelor 90 MG Tabs tablet Commonly known as: BRILINTA Take 1 tablet (90 mg total) by mouth 2 (two) times daily.   vitamin B-12 250 MCG tablet Commonly known as: CYANOCOBALAMIN Take 250 mcg by mouth daily.       Consultations:  Cardiology  Procedures/Studies:  2D Echo on 07/10/2020 Left ventricular ejection fraction, by estimation, is 40 to 45%. The  left ventricle has mildly decreased function. Left ventricular endocardial  border not optimally defined to evaluate regional wall motion despite use  of Definity contrast. Global  hypokinesis, possible inferior wall hypokinesis. The left ventricular  internal cavity size was mildly dilated. There is mild left ventricular  hypertrophy. Left ventricular diastolic parameters are consistent with  Grade I diastolic dysfunction (impaired  relaxation).  2. Right ventricular systolic function is mildly reduced. The right  ventricular size is normal.  3. The mitral valve is normal in structure. Trivial mitral valve  regurgitation. No evidence of mitral stenosis.  4. The aortic valve is grossly normal. Aortic valve regurgitation is not  visualized. No aortic stenosis is present.  5. Aortic dilatation noted. There is mild dilatation of the aortic root  and of the ascending aorta, measuring 42  mm.   Left heart catheterization on 07/11/2020  Mid Cx to Dist Cx lesion is 85% stenosed.  A drug-eluting stent was successfully placed using a STENT RESOLUTE ONYX 2.5X12. Postdilated 2.65 mm  Post intervention, there is a 0% residual stenosis.  -------------  Colon Flattery LM lesion is 25% stenosed.  Mid LM to Dist LM lesion is 25% stenosed with 60% stenosed side branch in Ost Cx to Prox Cx. This involves the ostium of Ramus and 1st Mrg  Ramus lesion is 60% stenosed. 1st  Mrg lesion is 40% stenosed.  Prox LAD lesion is 60% stenosed -discrete eccentric lesion  Mid LAD lesion is 60% stenosed -> focal concentric lesion in the bend  Prox RCA lesion is 15% stenosed.   SUMMARY  Multivessel CAD:  ? Clear CULPRIT LESION noted as 85% ulcerated mLCx(AVG Cx), -->   Successful DES PCI reducing to 0%: Resolute Onyx DES 2.5 mm x 12 mm - post-dilated to 2.65 mm ? Tifurcation AVG Cx-RI-1stMrg with each branch ~50-60%, prox LAD focal /eccentric ~60%,  ? mid LAD short ~60-65% concentric lesion.  Normal LVEDP - Echo showed mildly reduced LVEF with Inferolateral WMA.    DG Chest 2 View  Result Date: 07/08/2020 CLINICAL DATA:  Chest pain EXAM: CHEST - 2 VIEW COMPARISON:  CTA from earlier in the same day. FINDINGS: Cardiac shadow is within normal limits. Lungs are well aerated bilaterally. No focal infiltrate or sizable effusion is seen. No bony abnormality is noted. IMPRESSION: No acute abnormality seen. Electronically Signed   By: Inez Catalina M.D.   On: 07/08/2020 22:12   CT ANGIO CHEST PE W OR WO CONTRAST  Result Date: 07/08/2020 CLINICAL DATA:  Chest pain and shortness of breath EXAM: CT ANGIOGRAPHY CHEST WITH CONTRAST TECHNIQUE: Multidetector CT imaging of the chest was performed using the standard protocol during bolus administration of intravenous contrast. Multiplanar CT image reconstructions and MIPs were obtained to evaluate the vascular anatomy. CONTRAST:  34mL OMNIPAQUE IOHEXOL 350 MG/ML  SOLN COMPARISON:  05/09/2020 PET-CT FINDINGS: Cardiovascular: Thoracic aorta demonstrates atherosclerotic calcifications without aneurysmal dilatation or dissection. No cardiac enlargement is seen. The coronary arteries demonstrate diffuse calcifications. The pulmonary artery shows a normal branching pattern without definitive filling defect to suggest pulmonary embolism. Mediastinum/Nodes: Thoracic inlet is within normal limits. No sizable hilar or mediastinal adenopathy is noted. The esophagus as visualized is normal limits. Lungs/Pleura: The lungs are well aerated bilaterally. No focal infiltrate or sizable effusion is seen. No pneumothorax or sizable parenchymal nodule is noted. Some stable perivascular nodularity is noted unchanged from 2017 consistent with a benign etiology. Upper Abdomen: Visualized upper abdomen shows changes of prior cholecystectomy. An area of inflammatory changes noted in the subcutaneous tissues in an area of prior hypermetabolic nodule. A small lymph node is noted in the abdomen anterior to the stomach stable in appearance from the prior exam. Musculoskeletal: Degenerative changes of the thoracic spine are noted. Review of the MIP images confirms the above findings. IMPRESSION: No evidence of pulmonary emboli. Stable nodularity within the right middle lobe unchanged from 2017 consistent with a benign etiology. Previously seen nodularity in the anterior abdominal wall is less prominent with a small fluid collection identified. Correlate with any recent excision in this region. Aortic Atherosclerosis (ICD10-I70.0). Electronically Signed   By: Inez Catalina M.D.   On: 07/08/2020 17:04   CARDIAC CATHETERIZATION  Result Date: 07/11/2020  Mid Cx to Dist Cx lesion is 85% stenosed.  A drug-eluting stent was successfully placed using a STENT RESOLUTE ONYX 2.5X12. Postdilated 2.65 mm  Post intervention, there is a 0% residual stenosis.  -------------  Colon Flattery LM lesion is 25% stenosed.  Mid  LM to Dist LM lesion is 25% stenosed with 60% stenosed side branch in Ost Cx to Prox Cx. This involves the ostium of Ramus and 1st Mrg  Ramus lesion is 60% stenosed. 1st Mrg lesion is 40% stenosed.  Prox LAD lesion is 60% stenosed -discrete eccentric lesion  Mid LAD lesion is 60% stenosed -> focal concentric lesion in the  bend  Prox RCA lesion is 15% stenosed.  SUMMARY  Multivessel CAD:  Clear CULPRIT LESION noted as 85% ulcerated mLCx(AVG Cx), -->  Successful DES PCI reducing to 0%: Resolute Onyx DES 2.5 mm x 12 mm - post-dilated to 2.65 mm  Tifurcation AVG Cx-RI-1stMrg with each branch ~50-60%, prox LAD focal /eccentric ~60%,  mid LAD short ~60-65% concentric lesion.  Normal LVEDP - Echo showed mildly reduced LVEF with Inferolateral WMA. RECOMMENDATIONS  Transfer to [place] post PCI.  Continue to treat residual disease medically to allow recovery from recent surgery. Glenetta Hew, MD  DG CHEST PORT 1 VIEW  Result Date: 07/12/2020 CLINICAL DATA:  Short of breath EXAM: PORTABLE CHEST 1 VIEW COMPARISON:  07/08/2020 FINDINGS: Improved aeration in the lung bases with decreased atelectasis bilaterally. Heart size and vascularity normal. Negative for heart failure. No pleural effusion. IMPRESSION: Improved aeration with decreased bibasilar atelectasis. Electronically Signed   By: Franchot Gallo M.D.   On: 07/12/2020 14:00   ECHOCARDIOGRAM COMPLETE  Result Date: 07/10/2020    ECHOCARDIOGRAM REPORT   Patient Name:   Gary [name] Date of Exam: 07/10/2020 Medical Rec #:  [id number]              Height:       74.0 in Accession #:    [id number]             Weight:       297.2 lb Date of Birth:  1941/05/08              BSA:          2.572 m Patient Age:    4 years               BP:           121/57 mmHg Patient Gender: M                      HR:           64 bpm. Exam Location:  Inpatient Procedure: 2D Echo, Cardiac Doppler and Color Doppler Indications:    Acute Coronary Syndrome I24.9  History:         Patient has prior history of Echocardiogram examinations, most                 recent 06/15/2020. CHF; Risk Factors:Hypertension.  Sonographer:    Bernadene Person RDCS Sonographer#2:  Clayton Lefort RDCS (AE) Referring Phys: [id number] AVA SWAYZE IMPRESSIONS  1. Left ventricular ejection fraction, by estimation, is 40 to 45%. The left ventricle has mildly decreased function. Left ventricular endocardial border not optimally defined to evaluate regional wall motion despite use of Definity contrast. Global hypokinesis, possible inferior wall hypokinesis. The left ventricular internal cavity size was mildly dilated. There is mild left ventricular hypertrophy. Left ventricular diastolic parameters are consistent with Grade I diastolic dysfunction (impaired relaxation).  2. Right ventricular systolic function is mildly reduced. The right ventricular size is normal.  3. The mitral valve is normal in structure. Trivial mitral valve regurgitation. No evidence of mitral stenosis.  4. The aortic valve is grossly normal. Aortic valve regurgitation is not visualized. No aortic stenosis is present.  5. Aortic dilatation noted. There is mild dilatation of the aortic root and of the ascending aorta, measuring 42 mm. Comparison(s): A prior study was performed on 06/15/20. Prior images reviewed side by side. LVEF appears to have decreased mildly from prior exam. Conclusion(s)/Recommendation(s): No left ventricular mural or apical  thrombus/thrombi. FINDINGS  Left Ventricle: Left ventricular ejection fraction, by estimation, is 40 to 45%. The left ventricle has mildly decreased function. Left ventricular endocardial border not optimally defined to evaluate regional wall motion. Definity contrast agent was given IV to delineate the left ventricular endocardial borders. The left ventricular internal cavity size was mildly dilated. There is mild left ventricular hypertrophy. Left ventricular diastolic parameters are consistent with Grade I  diastolic dysfunction (impaired relaxation). Right Ventricle: The right ventricular size is normal. No increase in right ventricular wall thickness. Right ventricular systolic function is mildly reduced. Left Atrium: Left atrial size was normal in size. Right Atrium: Right atrial size was normal in size. Pericardium: There is no evidence of pericardial effusion. Mitral Valve: The mitral valve is normal in structure. Trivial mitral valve regurgitation. No evidence of mitral valve stenosis. Tricuspid Valve: The tricuspid valve is normal in structure. Tricuspid valve regurgitation is not demonstrated. No evidence of tricuspid stenosis. Aortic Valve: The aortic valve is grossly normal. Aortic valve regurgitation is not visualized. No aortic stenosis is present. Pulmonic Valve: The pulmonic valve was grossly normal. Pulmonic valve regurgitation is not visualized. No evidence of pulmonic stenosis. Aorta: Aortic dilatation noted. There is mild dilatation of the aortic root and of the ascending aorta, measuring 42 mm. Venous: The inferior vena cava was not well visualized. IAS/Shunts: The interatrial septum was not well visualized.  LEFT VENTRICLE PLAX 2D LVIDd:         5.90 cm  Diastology LVIDs:         3.90 cm  LV e' medial:    4.46 cm/s LV PW:         1.10 cm  LV E/e' medial:  13.8 LV IVS:        1.00 cm  LV e' lateral:   5.33 cm/s LVOT diam:     2.30 cm  LV E/e' lateral: 11.6 LV SV:         61 LV SV Index:   24 LVOT Area:     4.15 cm  RIGHT VENTRICLE RV S prime:     14.50 cm/s TAPSE (M-mode): 1.9 cm LEFT ATRIUM             Index       RIGHT ATRIUM           Index LA diam:        3.70 cm 1.44 cm/m  RA Area:     19.50 cm LA Vol (A2C):   76.1 ml 29.59 ml/m RA Volume:   51.80 ml  20.14 ml/m LA Vol (A4C):   63.0 ml 24.50 ml/m LA Biplane Vol: 70.7 ml 27.49 ml/m  AORTIC VALVE LVOT Vmax:   70.90 cm/s LVOT Vmean:  49.400 cm/s LVOT VTI:    0.147 m  AORTA Ao Root diam: 4.20 cm Ao Asc diam:  4.20 cm MITRAL VALVE MV Area  (PHT): 2.66 cm    SHUNTS MV Decel Time: 285 msec    Systemic VTI:  0.15 m MV E velocity: 61.60 cm/s  Systemic Diam: 2.30 cm MV A velocity: 84.90 cm/s MV E/A ratio:  0.73 Cherlynn Kaiser MD Electronically signed by Cherlynn Kaiser MD Signature Date/Time: 07/10/2020/4:49:07 PM    Final        The results of significant diagnostics from this hospitalization (including imaging, microbiology, ancillary and laboratory) are listed below for reference.     Microbiology: Recent Results (from the past 240 hour(s))  Respiratory Panel by RT PCR (Flu A&B, Covid) -  Nasopharyngeal Swab     Status: None   Collection Time: 07/08/20 11:51 PM   Specimen: Nasopharyngeal Swab  Result Value Ref Range Status   SARS Coronavirus 2 by RT PCR NEGATIVE NEGATIVE Final    Comment: (NOTE) SARS-CoV-2 target nucleic acids are NOT DETECTED.  The SARS-CoV-2 RNA is generally detectable in upper respiratoy specimens during the acute phase of infection. The lowest concentration of SARS-CoV-2 viral copies this assay can detect is 131 copies/mL. A negative result does not preclude SARS-Cov-2 infection and should not be used as the sole basis for treatment or other patient management decisions. A negative result may occur with  improper specimen collection/handling, submission of specimen other than nasopharyngeal swab, presence of viral mutation(s) within the areas targeted by this assay, and inadequate number of viral copies (<131 copies/mL). A negative result must be combined with clinical observations, patient history, and epidemiological information. The expected result is Negative.  Fact Sheet for Patients:  PinkCheek.be  Fact Sheet for Healthcare Providers:  GravelBags.it  This test is no t yet approved or cleared by the [place] FDA and  has been authorized for detection and/or diagnosis of SARS-CoV-2 by FDA under an Emergency Use Authorization  (EUA). This EUA will remain  in effect (meaning this test can be used) for the duration of the COVID-19 declaration under Section 564(b)(1) of the Act, 21 U.S.C. section 360bbb-3(b)(1), unless the authorization is terminated or revoked sooner.     Influenza A by PCR NEGATIVE NEGATIVE Final   Influenza B by PCR NEGATIVE NEGATIVE Final    Comment: (NOTE) The Xpert Xpress SARS-CoV-2/FLU/RSV assay is intended as an aid in  the diagnosis of influenza from Nasopharyngeal swab specimens and  should not be used as a sole basis for treatment. Nasal washings and  aspirates are unacceptable for Xpert Xpress SARS-CoV-2/FLU/RSV  testing.  Fact Sheet for Patients: PinkCheek.be  Fact Sheet for Healthcare Providers: GravelBags.it  This test is not yet approved or cleared by the [place] FDA and  has been authorized for detection and/or diagnosis of SARS-CoV-2 by  FDA under an Emergency Use Authorization (EUA). This EUA will remain  in effect (meaning this test can be used) for the duration of the  Covid-19 declaration under Section 564(b)(1) of the Act, 21  U.S.C. section 360bbb-3(b)(1), unless the authorization is  terminated or revoked. Performed at [facility], [place] [address] [place], [place] [address]      Labs: BNP (last 3 results) No results for input(s): BNP in the last 8760 hours. Basic Metabolic Panel: Recent Labs  Lab 07/08/20 2125 07/09/20 0354 07/10/20 0110 07/12/20 0636 07/13/20 0327  NA 141 140 138 137 138  K 4.6 4.2 4.0 4.0 3.8  CL 107 107 104 101 102  CO2 25 21* 26 25 25   GLUCOSE 138* 123* 100* 132* 123*  BUN 17 18 16 12 14   CREATININE 1.38* 1.29* 1.13 1.03 1.20  CALCIUM 9.0 8.7* 8.8* 9.0 9.2   Liver Function Tests: Recent Labs  Lab 07/10/20 0110 07/13/20 0327  AST 21 24  ALT 19 19  ALKPHOS 37* 43  BILITOT 0.8 1.5*  PROT 6.2* 6.9  ALBUMIN 3.5 3.8   No results for input(s): LIPASE,  AMYLASE in the last 168 hours. No results for input(s): AMMONIA in the last 168 hours. CBC: Recent Labs  Lab 07/09/20 0354 07/10/20 0110 07/11/20 0227 07/12/20 0636 07/13/20 0327  WBC 7.1 7.3 7.1 7.8 8.0  HGB 12.9* 12.5* 12.4* 12.7* 12.8*  HCT 41.8 38.2* 37.8* 38.8* 38.7*  MCV 96.1 91.2 90.2 90.2 90.4  PLT 144* 148* 141* 132* 152   Cardiac Enzymes: No results for input(s): CKTOTAL, CKMB, CKMBINDEX, TROPONINI in the last 168 hours. BNP: Invalid input(s): POCBNP CBG: Recent Labs  Lab 07/12/20 1211  GLUCAP 197*   D-Dimer No results for input(s): DDIMER in the last 72 hours. Hgb A1c No results for input(s): HGBA1C in the last 72 hours. Lipid Profile No results for input(s): CHOL, HDL, LDLCALC, TRIG, CHOLHDL, LDLDIRECT in the last 72 hours. Thyroid function studies No results for input(s): TSH, T4TOTAL, T3FREE, THYROIDAB in the last 72 hours.  Invalid input(s): FREET3 Anemia work up No results for input(s): VITAMINB12, FOLATE, FERRITIN, TIBC, IRON, RETICCTPCT in the last 72 hours. Urinalysis No results found for: COLORURINE, APPEARANCEUR, LABSPEC, PHURINE, GLUCOSEU, HGBUR, BILIRUBINUR, KETONESUR, PROTEINUR, UROBILINOGEN, NITRITE, LEUKOCYTESUR Sepsis Labs Invalid input(s): PROCALCITONIN,  WBC,  LACTICIDVEN   Time coordinating discharge: 45 minutes  SIGNED:  Mercy Riding, MD  [facility] 07/13/2020, 10:26 PM  If 7PM-7AM, please contact night-coverage [email or web address]

## 2020-07-14 ENCOUNTER — Telehealth: Payer: Self-pay

## 2020-07-14 NOTE — Telephone Encounter (Signed)
Gary [name] called in stating the hospital told him he needed to call and schedule an appointment for next week with Dr. Agustin Cree. Let him know that the appointment was set by the hospital for 07/29/2020.

## 2020-07-19 ENCOUNTER — Telehealth: Payer: Self-pay | Admitting: Cardiology

## 2020-07-19 NOTE — Telephone Encounter (Signed)
Called patient informed him per Dr. Agustin Cree to try to stay on brilinta if he can. Per Dr. Agustin Cree advised him to try coffee before brilinta it may help. Patient advised to call us if it doesn't get better and we can switch him to something else. He verbally understood. No further questions.

## 2020-07-19 NOTE — Telephone Encounter (Signed)
Called patient. He reports since he has been out of hospital on 07/13/20 he has been having shortness of breath when taking brilinta. He has no swelling, or weight gain. He is sure it is from the [place] he wants to know if he should continue will consult with Dr. Agustin Cree.

## 2020-07-19 NOTE — Telephone Encounter (Signed)
Pt c/o medication issue:  1. Name of Medication: ticagrelor (BRILINTA) 90 MG TABS tablet  2. How are you currently taking this medication (dosage and times per day)? Did not take today but states he is still taking it.   3. Are you having a reaction (difficulty breathing--STAT)? Difficulty breathing but did not take medication today so not having it right now.  4. What is your medication issue? Patient states he was put on this medication while in the hospital when he had a heart attack. He states he is having a hard time breathing every time he takes it. States he took it last night before he took any of his other medications and states he started feeling SOB before he took any of the other medications.   Pt c/o Shortness Of Breath: STAT if SOB developed within the last 24 hours or pt is noticeably SOB on the phone  1. Are you currently SOB (can you hear that pt is SOB on the phone)? No   2. How long have you been experiencing SOB? Ever since he started on Brilinta on 10/20  3. Are you SOB when sitting or when up moving around? Both and lasts about 5-6 hours  4. Are you currently experiencing any other symptoms? Headaches and dizziness when he stands up.

## 2020-07-29 ENCOUNTER — Ambulatory Visit (INDEPENDENT_AMBULATORY_CARE_PROVIDER_SITE_OTHER): Payer: Medicare Other | Admitting: Cardiology

## 2020-07-29 ENCOUNTER — Other Ambulatory Visit: Payer: Self-pay

## 2020-07-29 ENCOUNTER — Encounter: Payer: Self-pay | Admitting: Cardiology

## 2020-07-29 VITALS — BP 120/82 | HR 78 | Ht 74.0 in | Wt 293.0 lb

## 2020-07-29 DIAGNOSIS — M1990 Unspecified osteoarthritis, unspecified site: Secondary | ICD-10-CM | POA: Diagnosis not present

## 2020-07-29 DIAGNOSIS — I255 Ischemic cardiomyopathy: Secondary | ICD-10-CM

## 2020-07-29 DIAGNOSIS — C240 Malignant neoplasm of extrahepatic bile duct: Secondary | ICD-10-CM | POA: Diagnosis not present

## 2020-07-29 DIAGNOSIS — F419 Anxiety disorder, unspecified: Secondary | ICD-10-CM

## 2020-07-29 DIAGNOSIS — I2510 Atherosclerotic heart disease of native coronary artery without angina pectoris: Secondary | ICD-10-CM

## 2020-07-29 DIAGNOSIS — I517 Cardiomegaly: Secondary | ICD-10-CM

## 2020-07-29 DIAGNOSIS — I509 Heart failure, unspecified: Secondary | ICD-10-CM | POA: Insufficient documentation

## 2020-07-29 DIAGNOSIS — I10 Essential (primary) hypertension: Secondary | ICD-10-CM | POA: Insufficient documentation

## 2020-07-29 HISTORY — DX: Cardiomegaly: I51.7

## 2020-07-29 HISTORY — DX: Atherosclerotic heart disease of native coronary artery without angina pectoris: I25.10

## 2020-07-29 NOTE — Patient Instructions (Signed)

## 2020-07-29 NOTE — Progress Notes (Signed)
Cardiology Office Note:    Date:  07/29/2020   ID:  Gary [name], DOB [birth year], MRN [id number]  PCP:  Bonnita Nasuti, MD  Cardiologist:  Jenne Campus, MD    Referring MD: Bonnita Nasuti, MD   Chief Complaint  Patient presents with  . Hospitalization Follow-up  I am doing much better  History of Present Illness:    Gary [name] is a 79 y.o. male with past medical history significant for coronary artery disease, recently on 11 July 2020 he did have a cardiac catheterization in face of non-STEMI he was find to have multiple lesions including the culprit lesion which was mid to distal circumflex artery.  That being addressed with drug-eluting stent.  He was put on dual antiplatelets therapy and discharged home.  His past medical history is also significant for cardiomyopathy, initially thought aware that this is nonischemic however there is some ischemic component clearly associated with it.  Ejection fraction 40 to 45%.  Treated with appropriate medications.  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  And radiation is contemplated. Overall he is doing very well after cardiac catheterization still described to have some orthostatic hypotension.  By denies have any chest pain tightness squeezing pressure burning chest.  He thinks he got more energy he started walking on the regular basis and doing quite well.  Past Medical History:  Diagnosis Date  . Anxiety   . Arthritis   . 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 CHF (congestive heart failure) ([place])   . Hypertension     Past Surgical History:  Procedure Laterality Date  . BILE DUCT EXPLORATION     To remove cancer  . CORONARY STENT INTERVENTION N/A 07/11/2020   Procedure: CORONARY STENT INTERVENTION;  Surgeon: Leonie Man, MD;  Location: [facility];  Service: Cardiovascular;  Laterality: N/A;  . LEFT HEART CATH AND CORONARY  ANGIOGRAPHY N/A 07/11/2020   Procedure: LEFT HEART CATH AND CORONARY ANGIOGRAPHY;  Surgeon: Leonie Man, MD;  Location: [facility];  Service: Cardiovascular;  Laterality: N/A;  . MOHS SURGERY     Basil cell on nose    Current Medications: Current Meds  Medication Sig  . ALPRAZolam (XANAX) 0.5 MG tablet Take 0.5 mg by mouth 3 (three) times daily as needed for anxiety.   Marland Kitchen aspirin EC 81 MG tablet Take 1 tablet (81 mg total) by mouth daily. Swallow whole.  Marland Kitchen atorvastatin (LIPITOR) 20 MG tablet Take 1 tablet (20 mg total) by mouth daily.  Marland Kitchen buPROPion (WELLBUTRIN XL) 300 MG 24 hr tablet Take 300 mg by mouth in the morning and at bedtime.   . carvedilol (COREG) 3.125 MG tablet Take 3.125 mg by mouth 2 (two) times daily.  . cholecalciferol (VITAMIN D3) 25 MCG (1000 UNIT) tablet Take 1,000 Units by mouth daily.  Marland Kitchen ENTRESTO 24-26 MG Take 1 tablet by mouth daily.   . furosemide (LASIX) 20 MG tablet Take 1 tablet (20 mg total) by mouth daily.  Marland Kitchen HYDROcodone-acetaminophen (NORCO) 10-325 MG tablet Take 1 tablet by mouth 3 (three) times daily as needed for moderate pain.   . isosorbide mononitrate (IMDUR) 30 MG 24 hr tablet Take 1 tablet (30 mg total) by mouth daily.  . montelukast (SINGULAIR) 10 MG tablet Take 10 mg by mouth daily.   . Multiple Vitamin (MULTIVITAMIN) tablet Take 1 tablet by mouth daily.  . nitroGLYCERIN (NITROSTAT)  0.4 MG SL tablet Place 1 tablet (0.4 mg total) under the tongue every 5 (five) minutes as needed for chest pain.  . Omega-3 Fatty Acids (FISH OIL) 1000 MG CAPS Take 1 capsule by mouth daily.   . pantoprazole (PROTONIX) 40 MG tablet Take 1 tablet (40 mg total) by mouth daily.  . tamsulosin (FLOMAX) 0.4 MG CAPS capsule Take 0.4 mg by mouth daily.   . ticagrelor (BRILINTA) 90 MG TABS tablet Take 1 tablet (90 mg total) by mouth 2 (two) times daily.  . vitamin B-12 (CYANOCOBALAMIN) 250 MCG tablet Take 250 mcg by mouth daily.     Allergies:   Pork-derived products  and Amoxicillin   Social History   Socioeconomic History  . Marital status: Widowed    Spouse name: Not on file  . Number of children: Not on file  . Years of education: Not on file  . Highest education level: Not on file  Occupational History  . Not on file  Tobacco Use  . Smoking status: Never Smoker  . Smokeless tobacco: Current User    Types: Chew  Substance and Sexual Activity  . Alcohol use: Not Currently  . Drug use: Never  . Sexual activity: Not on file  Other Topics Concern  . Not on file  Social History Narrative  . Not on file   Social Determinants of Health   Financial Resource Strain:   . Difficulty of Paying Living Expenses: Not on file  Food Insecurity:   . Worried About Charity fundraiser in the Last Year: Not on file  . Ran Out of Food in the Last Year: Not on file  Transportation Needs:   . Lack of Transportation (Medical): Not on file  . Lack of Transportation (Non-Medical): Not on file  Physical Activity:   . Days of Exercise per Week: Not on file  . Minutes of Exercise per Session: Not on file  Stress:   . Feeling of Stress : Not on file  Social Connections:   . Frequency of Communication with Friends and Family: Not on file  . Frequency of Social Gatherings with Friends and Family: Not on file  . Attends Religious Services: Not on file  . Active Member of Clubs or Organizations: Not on file  . Attends Archivist Meetings: Not on file  . Marital Status: Not on file     Family History: The patient's family history includes Breast cancer in his sister and sister; Cancer in his father. ROS:   Please see the history of present illness.    All 14 point review of systems negative except as described per history of present illness  EKGs/Labs/Other Studies Reviewed:      Recent Labs: 07/08/2020: NT-Pro BNP 164 07/13/2020: ALT 19; BUN 14; Creatinine, Ser 1.20; Hemoglobin 12.8; Platelets 152; Potassium 3.8; Sodium 138  Recent Lipid  Panel    Component Value Date/Time   CHOL 139 07/09/2020 0354   TRIG 255 (H) 07/09/2020 0354   HDL 33 (L) 07/09/2020 0354   CHOLHDL 4.2 07/09/2020 0354   VLDL 51 (H) 07/09/2020 0354   LDLCALC 55 07/09/2020 0354    Physical Exam:    VS:  BP 120/82 (BP Location: Left Arm, Patient Position: Sitting, Cuff Size: Normal)   Pulse 78   Ht 6\' 2"  (1.88 m)   Wt 293 lb (132.9 kg)   SpO2 94%   BMI 37.62 kg/m     Wt Readings from Last 3 Encounters:  07/29/20 293  lb (132.9 kg)  07/11/20 293 lb 10.4 oz (133.2 kg)  07/08/20 300 lb (136.1 kg)     GEN:  Well nourished, well developed in no acute distress HEENT: Normal NECK: No JVD; No carotid bruits LYMPHATICS: No lymphadenopathy CARDIAC: RRR, no murmurs, no rubs, no gallops RESPIRATORY:  Clear to auscultation without rales, wheezing or rhonchi  ABDOMEN: Soft, non-tender, non-distended MUSCULOSKELETAL:  No edema; No deformity  SKIN: Warm and dry LOWER EXTREMITIES: no swelling NEUROLOGIC:  Alert and oriented x 3 PSYCHIATRIC:  Normal affect   ASSESSMENT:    1. 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 Arthritis   4. Anxiety   5. Coronary artery disease involving native coronary artery of native heart without angina pectoris   6. Primary hypertension    PLAN:    In order of problems listed above:  1. Coronary disease status post recent cardiac catheterization in face of acute non-STEMI.  He drug-eluting stent was placed to mid and distal portion of circumflex artery.  He is on dual antiplatelets therapy and I discussed the rationale for it with him.  He will continue obviously. 2. Cardiomyopathy ejection fraction 4045% he is on small dose of Entresto as well as small dose of carvedilol still complaining of having onset of dizziness when he gets up.  Therefore, I will postpone augmenting this therapy until next visit.  Overall hemodynamically stable we will continue present medications. 3. Dyslipidemia: He is on Lipitor  20 which I will continue. 4. Cholangiocarcinoma status post recent solitary lesion removal from the wall of his abdomen with some positive margins.  I did review note by oncologist as well as radiotherapist.  Radiotherapy is planned.  The purpose is to completely eradicate the cancer.  Even though he does carry some risk from heart point of view I think we need to proceed with radiation.  I will try to talk to oncologist to see how well we can do shielding in this situation.   Medication Adjustments/Labs and Tests Ordered: Current medicines are reviewed at length with the patient today.  Concerns regarding medicines are outlined above.  No orders of the defined types were placed in this encounter.  Medication changes: No orders of the defined types were placed in this encounter.   Signed, Park Liter, MD, [facility] 07/29/2020 10:31 AM    [place]

## 2020-08-08 ENCOUNTER — Ambulatory Visit: Payer: Medicare Other | Admitting: Cardiology

## 2020-08-08 ENCOUNTER — Telehealth (HOSPITAL_COMMUNITY): Payer: Self-pay

## 2020-08-08 NOTE — Telephone Encounter (Signed)
Faxed cardiac rehab referral to [facility] cardiac rehab, per phase I.

## 2020-08-16 ENCOUNTER — Inpatient Hospital Stay: Payer: Medicare Other | Admitting: Oncology

## 2020-08-25 NOTE — Progress Notes (Signed)
[facility]  [address] [place],  [place]  [address] [phone]  Clinic Day:  08/26/2020  Referring physician: Bonnita Nasuti, MD   HISTORY OF PRESENT ILLNESS:  The patient is a 79 y.o. male with metastatic intrahepatic cholangiocarcinoma, which includes a subcutaneous metastasis recently resected.  He recently underwent palliative radiation to this resected site to prevent local recurrence.  He comes in today to go over his abdominal MRI to determine if he has other sites of disease metastasis.  In the past, previous scans showed suspicious, but not definitive, findings to suggest disease recurrence along the edge of his liver where his previous surgery was performed.  Since his last visit, the patient has been doing well.  He denies having abdominal pain or other symptoms which concern him for overt signs of disease recurrence.     PHYSICAL EXAM:  Blood pressure (!) 145/81, pulse 70, temperature 98.8 F (37.1 C), resp. rate 16, height 6\' 2"  (1.88 m), weight 299 lb 6.4 oz (135.8 kg), SpO2 95 %. Wt Readings from Last 3 Encounters:  08/26/20 299 lb 6.4 oz (135.8 kg)  07/29/20 293 lb (132.9 kg)  07/11/20 293 lb 10.4 oz (133.2 kg)   Body mass index is 38.44 kg/m. Performance status (ECOG): 1 - Symptomatic but completely ambulatory Physical Exam Constitutional:      Appearance: Normal appearance. He is not ill-appearing.  HENT:     Mouth/Throat:     Mouth: Mucous membranes are moist.     Pharynx: Oropharynx is clear. No oropharyngeal exudate or posterior oropharyngeal erythema.  Cardiovascular:     Rate and Rhythm: Normal rate and regular rhythm.     Heart sounds: No murmur heard.  No friction rub. No gallop.   Pulmonary:     Effort: Pulmonary effort is normal. No respiratory distress.     Breath sounds: Normal breath sounds. No wheezing, rhonchi or rales.  Abdominal:     General: Bowel sounds are normal. There is no distension.      Palpations: Abdomen is soft. There is no mass (no palpable abdominal passes are appreciated).     Tenderness: There is no abdominal tenderness.  Musculoskeletal:        General: No swelling.     Right lower leg: No edema.     Left lower leg: No edema.  Lymphadenopathy:     Cervical: No cervical adenopathy.     Upper Body:     Right upper body: No supraclavicular or axillary adenopathy.     Left upper body: No supraclavicular or axillary adenopathy.     Lower Body: No right inguinal adenopathy. No left inguinal adenopathy.  Skin:    General: Skin is warm.     Coloration: Skin is not jaundiced.     Findings: No lesion or rash.  Neurological:     General: No focal deficit present.     Mental Status: He is alert and oriented to person, place, and time. Mental status is at baseline.     Cranial Nerves: Cranial nerves are intact.  Psychiatric:        Mood and Affect: Mood normal.        Behavior: Behavior normal.        Thought Content: Thought content normal.     LABS:    STUDIES:  His abdominal MRI revealed the following: FINDINGS: Lower chest: No acute findings.  Hepatobiliary: Status post left hepatectomy. On the portal venous phase there is abnormal  tumor enhancement along the resection margin within the residual segment 4B measuring 2.4 x 2.0 cm, image 42/802. Previously this measured 1.7 x 1.2 cm. On the arterial phase there is a new, small enhancing lesion adjacent to the resection margin measuring 0.8 cm, image 48/801. Small focus of arterial phase enhancement within posterior right hepatic lobe measures 7 mm, image 51/801. Previously 6 mm.  Gallbladder is surgically absent. No signs of biliary ductal dilatation.  Pancreas: No mass, inflammatory changes, or other parenchymal abnormality identified.  Spleen:  Within normal limits in size and appearance.  Adrenals/Urinary Tract: Normal appearance of the adrenal glands. Small bilateral kidney cysts are unchanged.  No hydronephrosis identified bilaterally.  Stomach/Bowel: Visualized portions within the abdomen are unremarkable.  Vascular/Lymphatic:  No aneurysm.  No abdominal adenopathy.  Other: No ascites. Within the ventral aspect of the upper abdomen below the midline incision site there is a small enhancing nodule measuring 1.7 cm, image 49/802. Previously 1.4 cm. The patient has undergone interval resection of subcutaneous nodule within the midline ventral abdominal wall.  Musculoskeletal: No suspicious bone lesions identified.  IMPRESSION: 1. Status post left hepatectomy. Increase in size of enhancing lesions along the resection margin. Within the posterior right hepatic lobe there is a smaller lesion which is stable to mildly increased in size in the interval. 2. Peritoneal nodule along the midline of the upper abdomen has increased in size from previous exam and is also worrisome for residual/recurrent tumor. 3. Interval resection of enhancing subcutaneous tumor within the ventral aspect of the upper abdomen.   ASSESSMENT & PLAN:  Assessment/Plan:  A 79 y.o. male whose MRI virtually confirms metastatic cholangiocarcinoma.  In addition to having disease on the surface of his resected liver, he has disease deep in his liver and in his peritoneum.  As this signifies systemic disease, this patient will need systemic therapy from here on out for his disease management.  In clinic today, I did show the patient his MRI images.  Although he really does not have a significant disease burden, he understands he is ultimately dealing with incurable disease.  Moving forward, his resected abdominal mass will be sent to [facility] for NGS sequencing to determine if any targeted therapy can be used for his disease.  If not, palliative chemotherapy, such as FOLFOX or cisplatin/gemctibine, would be initiated.  I will see him back in 3 weeks to go over these test results and their implications.    The  patient understands all the plans discussed today and is in agreement with them.    Cylie Dor Macarthur Critchley, MD

## 2020-08-26 ENCOUNTER — Other Ambulatory Visit: Payer: Self-pay | Admitting: Oncology

## 2020-08-26 ENCOUNTER — Inpatient Hospital Stay: Payer: Medicare Other | Attending: Oncology | Admitting: Oncology

## 2020-08-26 VITALS — BP 145/81 | HR 70 | Temp 98.8°F | Resp 16 | Ht 74.0 in | Wt 299.4 lb

## 2020-08-26 DIAGNOSIS — C240 Malignant neoplasm of extrahepatic bile duct: Secondary | ICD-10-CM | POA: Diagnosis not present

## 2020-08-26 DIAGNOSIS — I255 Ischemic cardiomyopathy: Secondary | ICD-10-CM | POA: Diagnosis not present

## 2020-08-29 ENCOUNTER — Ambulatory Visit (INDEPENDENT_AMBULATORY_CARE_PROVIDER_SITE_OTHER): Payer: Medicare Other | Admitting: Cardiology

## 2020-08-29 ENCOUNTER — Other Ambulatory Visit: Payer: Self-pay

## 2020-08-29 ENCOUNTER — Encounter: Payer: Self-pay | Admitting: Cardiology

## 2020-08-29 VITALS — BP 120/80 | HR 73 | Ht 74.0 in | Wt 297.0 lb

## 2020-08-29 DIAGNOSIS — I2510 Atherosclerotic heart disease of native coronary artery without angina pectoris: Secondary | ICD-10-CM

## 2020-08-29 DIAGNOSIS — I10 Essential (primary) hypertension: Secondary | ICD-10-CM

## 2020-08-29 DIAGNOSIS — C240 Malignant neoplasm of extrahepatic bile duct: Secondary | ICD-10-CM | POA: Diagnosis not present

## 2020-08-29 DIAGNOSIS — I255 Ischemic cardiomyopathy: Secondary | ICD-10-CM

## 2020-08-29 MED ORDER — CARVEDILOL 6.25 MG PO TABS: 6.2500 mg | ORAL_TABLET | Freq: Two times a day (BID) | ORAL | 2 refills | Status: DC

## 2020-08-29 NOTE — Progress Notes (Signed)
Cardiology Office Note:    Date:  08/29/2020   ID:  Gary [name], DOB October 29, 1940, MRN [id number]  PCP:  Bonnita Nasuti, MD  Cardiologist:  Jenne Campus, MD    Referring MD: Bonnita Nasuti, MD   Chief Complaint  Patient presents with  . Follow-up  I am doing fine  History of Present Illness:    Gary [name] is a 79 y.o. male with past medical history significant for coronary artery disease, in July 11, 2020 he did have cardiac catheterization in face of acute non-STEMI.  He was find to have multiple lesions including the culprit lesion which was the mid and distal portion of the circumflex artery.  That was addressed with drug-eluting stent.  He was put on dual antiplatelets therapy and discharged home.  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  Etiology of this cardiomyopathy is at least partially ischemic.  Gradually I am trying to put him on the right medication to steroids while he is here today.  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  That was resected and now he is receiving radiation therapy for the lesion. Overall he is doing well.  Denies have any chest pain tightness squeezing pressure burning chest no palpitations no dizziness.  Past Medical History:  Diagnosis Date  . AKI (acute kidney injury) ([place]) 07/09/2020  . Anxiety   . Arthritis   . 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 Cardiomyopathy ([place]) 04/24/2019  . CHF (congestive heart failure) ([place])   . 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 Coronary artery disease with PTCA and stenting of mid and distal circumflex artery on 07/11/2020 07/29/2020  . Diastolic congestive heart failure ([place]) 04/24/2019  . Dyspnea on exertion 04/24/2019  . Essential hypertension 04/24/2019  . Hypertension   . Liver tumor 12/27/2017   Formatting of this note might be different from the original.  Adenocarcinoma  . NSTEMI (non-ST elevated myocardial infarction) ([place]) 07/09/2020  . Preop cardiovascular exam 06/15/2020  . UTI (urinary tract infection) 01/08/2018    Past Surgical History:  Procedure Laterality Date  . BILE DUCT EXPLORATION     To remove cancer  . CORONARY STENT INTERVENTION N/A 07/11/2020   Procedure: CORONARY STENT INTERVENTION;  Surgeon: Leonie Man, MD;  Location: [facility];  Service: Cardiovascular;  Laterality: N/A;  . LEFT HEART CATH AND CORONARY ANGIOGRAPHY N/A 07/11/2020   Procedure: LEFT HEART CATH AND CORONARY ANGIOGRAPHY;  Surgeon: Leonie Man, MD;  Location: [facility];  Service: Cardiovascular;  Laterality: N/A;  . MOHS SURGERY     Basil cell on nose    Current Medications: Current Meds  Medication Sig  . ALPRAZolam (XANAX) 0.5 MG tablet Take 0.5 mg by mouth 3 (three) times daily as needed for anxiety.   Marland Kitchen aspirin EC 81 MG tablet Take 1 tablet (81 mg total) by mouth daily. Swallow whole.  Marland Kitchen atorvastatin (LIPITOR) 20 MG tablet Take 1 tablet (20 mg total) by mouth daily.  Marland Kitchen buPROPion (WELLBUTRIN XL) 300 MG 24 hr tablet Take 300 mg by mouth in the morning and at bedtime.   . carvedilol (COREG) 3.125 MG tablet Take 3.125 mg by mouth 2 (two) times daily.  . cholecalciferol (VITAMIN D3) 25 MCG (1000 UNIT) tablet Take 1,000 Units by mouth daily.  Marland Kitchen ENTRESTO 24-26 MG Take 1  tablet by mouth daily.   . furosemide (LASIX) 20 MG tablet Take 1 tablet (20 mg total) by mouth daily.  Marland Kitchen HYDROcodone-acetaminophen (NORCO) 10-325 MG tablet Take 1 tablet by mouth 3 (three) times daily as needed for moderate pain.   . isosorbide mononitrate (IMDUR) 30 MG 24 hr tablet Take 1 tablet (30 mg total) by mouth daily.  . montelukast (SINGULAIR) 10 MG tablet Take 10 mg by mouth daily.   . Multiple Vitamin (MULTIVITAMIN) tablet Take 1 tablet by mouth daily.  . nitroGLYCERIN (NITROSTAT) 0.4 MG SL tablet Place 1 tablet (0.4 mg total) under the tongue every 5  (five) minutes as needed for chest pain.  . Omega-3 Fatty Acids (FISH OIL) 1000 MG CAPS Take 1 capsule by mouth daily.   . pantoprazole (PROTONIX) 40 MG tablet Take 1 tablet (40 mg total) by mouth daily.  . tamsulosin (FLOMAX) 0.4 MG CAPS capsule Take 0.4 mg by mouth daily.   . ticagrelor (BRILINTA) 90 MG TABS tablet Take 1 tablet (90 mg total) by mouth 2 (two) times daily.  . vitamin B-12 (CYANOCOBALAMIN) 250 MCG tablet Take 250 mcg by mouth daily.     Allergies:   Pork-derived products and Amoxicillin   Social History   Socioeconomic History  . Marital status: Widowed    Spouse name: Not on file  . Number of children: Not on file  . Years of education: Not on file  . Highest education level: Not on file  Occupational History  . Not on file  Tobacco Use  . Smoking status: Never Smoker  . Smokeless tobacco: Current User    Types: Chew  Substance and Sexual Activity  . Alcohol use: Not Currently  . Drug use: Never  . Sexual activity: Not on file  Other Topics Concern  . Not on file  Social History Narrative  . Not on file   Social Determinants of Health   Financial Resource Strain:   . Difficulty of Paying Living Expenses: Not on file  Food Insecurity:   . Worried About Charity fundraiser in the Last Year: Not on file  . Ran Out of Food in the Last Year: Not on file  Transportation Needs:   . Lack of Transportation (Medical): Not on file  . Lack of Transportation (Non-Medical): Not on file  Physical Activity:   . Days of Exercise per Week: Not on file  . Minutes of Exercise per Session: Not on file  Stress:   . Feeling of Stress : Not on file  Social Connections:   . Frequency of Communication with Friends and Family: Not on file  . Frequency of Social Gatherings with Friends and Family: Not on file  . Attends Religious Services: Not on file  . Active Member of Clubs or Organizations: Not on file  . Attends Archivist Meetings: Not on file  . Marital  Status: Not on file     Family History: The patient's family history includes Breast cancer in his sister and sister; Cancer in his father. ROS:   Please see the history of present illness.    All 14 point review of systems negative except as described per history of present illness  EKGs/Labs/Other Studies Reviewed:      Recent Labs: 07/08/2020: NT-Pro BNP 164 07/13/2020: ALT 19; BUN 14; Creatinine, Ser 1.20; Hemoglobin 12.8; Platelets 152; Potassium 3.8; Sodium 138  Recent Lipid Panel    Component Value Date/Time   CHOL 139 07/09/2020 0354   TRIG  255 (H) 07/09/2020 0354   HDL 33 (L) 07/09/2020 0354   CHOLHDL 4.2 07/09/2020 0354   VLDL 51 (H) 07/09/2020 0354   LDLCALC 55 07/09/2020 0354    Physical Exam:    VS:  BP 120/80 (BP Location: Right Arm, Patient Position: Sitting)   Pulse 73   Ht 6\' 2"  (1.88 m)   Wt 297 lb (134.7 kg)   SpO2 93%   BMI 38.13 kg/m     Wt Readings from Last 3 Encounters:  08/29/20 297 lb (134.7 kg)  08/26/20 299 lb 6.4 oz (135.8 kg)  07/29/20 293 lb (132.9 kg)     GEN:  Well nourished, well developed in no acute distress HEENT: Normal NECK: No JVD; No carotid bruits LYMPHATICS: No lymphadenopathy CARDIAC: RRR, no murmurs, no rubs, no gallops RESPIRATORY:  Clear to auscultation without rales, wheezing or rhonchi  ABDOMEN: Soft, non-tender, non-distended MUSCULOSKELETAL:  No edema; No deformity  SKIN: Warm and dry LOWER EXTREMITIES: no swelling NEUROLOGIC:  Alert and oriented x 3 PSYCHIATRIC:  Normal affect   ASSESSMENT:    1. Coronary artery disease involving native coronary artery of native heart without angina pectoris   2. Primary hypertension   3. Ischemic cardiomyopathy   4. 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 Coronary disease: Stable from that point review on dual antiplatelet therapy we will continue present management. 2. Essential hypertension: Blood pressure well controlled  continue present management. 3. Ischemic cardiomyopathy: Do EKG today.  She is fine will double the dose of Coreg. 4. Cholangiocarcinoma.  Treated with radiation therapy right now. 5. Dyslipidemia: I did review K PN his LDL is 55 HDL 33 this is from 07/09/2020.  We will continue present management.   Medication Adjustments/Labs and Tests Ordered: Current medicines are reviewed at length with the patient today.  Concerns regarding medicines are outlined above.  No orders of the defined types were placed in this encounter.  Medication changes: No orders of the defined types were placed in this encounter.   Signed, Park Liter, MD, [facility] 08/29/2020 4:07 PM    [place]

## 2020-08-29 NOTE — Addendum Note (Signed)
Addended by: Senaida Ores on: 08/29/2020 04:24 PM   Modules accepted: Orders

## 2020-08-29 NOTE — Addendum Note (Signed)
Addended by: Senaida Ores on: 08/29/2020 04:13 PM   Modules accepted: Orders

## 2020-08-29 NOTE — Patient Instructions (Addendum)
Medication Instructions:  Your physician has recommended you make the following change in your medication:  INCREASE: Carvedilol to 6.25 mg twice daily   *If you need a refill on your cardiac medications before your next appointment, please call your pharmacy*   Lab Work: None If you have labs (blood work) drawn today and your tests are completely normal, you will receive your results only by: Marland Kitchen MyChart Message (if you have MyChart) OR . A paper copy in the mail If you have any lab test that is abnormal or we need to change your treatment, we will call you to review the results.   Testing/Procedures: None   Follow-Up: At [facility], you and your health needs are our priority.  As part of our continuing mission to provide you with exceptional heart care, we have created designated Provider Care Teams.  These Care Teams include your primary Cardiologist (physician) and Advanced Practice Providers (APPs -  Physician Assistants and Nurse Practitioners) who all work together to provide you with the care you need, when you need it.  We recommend signing up for the patient portal called "MyChart".  Sign up information is provided on this After Visit Summary.  MyChart is used to connect with patients for Virtual Visits (Telemedicine).  Patients are able to view lab/test results, encounter notes, upcoming appointments, etc.  Non-urgent messages can be sent to your provider as well.   To learn more about what you can do with MyChart, go to [email or web address].    Your next appointment:   3 month(s)  The format for your next appointment:   In Person  Provider:   Jenne Campus, MD   Other Instructions

## 2020-08-30 DIAGNOSIS — C240 Malignant neoplasm of extrahepatic bile duct: Secondary | ICD-10-CM

## 2020-08-30 DIAGNOSIS — R42 Dizziness and giddiness: Secondary | ICD-10-CM | POA: Insufficient documentation

## 2020-08-30 DIAGNOSIS — R413 Other amnesia: Secondary | ICD-10-CM

## 2020-08-30 DIAGNOSIS — M150 Primary generalized (osteo)arthritis: Secondary | ICD-10-CM | POA: Insufficient documentation

## 2020-08-30 DIAGNOSIS — R2681 Unsteadiness on feet: Secondary | ICD-10-CM

## 2020-08-30 DIAGNOSIS — G43009 Migraine without aura, not intractable, without status migrainosus: Secondary | ICD-10-CM

## 2020-08-30 DIAGNOSIS — I509 Heart failure, unspecified: Secondary | ICD-10-CM | POA: Insufficient documentation

## 2020-08-30 DIAGNOSIS — F419 Anxiety disorder, unspecified: Secondary | ICD-10-CM | POA: Insufficient documentation

## 2020-08-30 HISTORY — DX: Other amnesia: R41.3

## 2020-08-30 HISTORY — DX: Migraine without aura, not intractable, without status migrainosus: G43.009

## 2020-08-30 HISTORY — DX: Malignant neoplasm of extrahepatic bile duct: C24.0

## 2020-08-30 HISTORY — DX: Dizziness and giddiness: R42

## 2020-08-30 HISTORY — DX: Unsteadiness on feet: R26.81

## 2020-08-30 HISTORY — DX: Primary generalized (osteo)arthritis: M15.0

## 2020-09-11 ENCOUNTER — Other Ambulatory Visit: Payer: Self-pay | Admitting: Cardiology

## 2020-09-12 NOTE — Telephone Encounter (Signed)
Rx refill sent to pharmacy. 

## 2020-09-14 ENCOUNTER — Telehealth: Payer: Self-pay | Admitting: Oncology

## 2020-09-14 NOTE — Telephone Encounter (Signed)
Returned patient's call.  He called to verify his appt for tomorrow

## 2020-09-14 NOTE — Progress Notes (Signed)
[facility]  [address] [place],  [place]  [address] [phone]  Clinic Day:  09/15/2020  Referring physician: Galvin Proffer, MD   HISTORY OF PRESENT ILLNESS:  The patient is a 79 y.o. male with metastatic intrahepatic cholangiocarcinoma, which includes peritoneal metastasis and reappearance of disease on the edge of his liver.  He comes in today to go over Foundation One testing to determine if some form of targeted therapy can be used to treat his disease.  Since his last visit, the patient has been doing fine.  He denies having any new GI symptoms which concern him for overt signs of disease progression.  PHYSICAL EXAM:  Blood pressure 135/68, pulse 74, temperature 98.2 F (36.8 C), resp. rate 18, height 6\' 2"  (1.88 m), weight (!) 300 lb 6.4 oz (136.3 kg), SpO2 92 %. Wt Readings from Last 3 Encounters:  09/15/20 (!) 300 lb 6.4 oz (136.3 kg)  08/29/20 297 lb (134.7 kg)  08/26/20 299 lb 6.4 oz (135.8 kg)   Body mass index is 38.57 kg/m. Performance status (ECOG): 1 - Symptomatic but completely ambulatory Physical Exam Constitutional:      Appearance: Normal appearance. He is not ill-appearing.  HENT:     Mouth/Throat:     Mouth: Mucous membranes are moist.     Pharynx: Oropharynx is clear. No oropharyngeal exudate or posterior oropharyngeal erythema.  Cardiovascular:     Rate and Rhythm: Normal rate and regular rhythm.     Heart sounds: No murmur heard. No friction rub. No gallop.   Pulmonary:     Effort: Pulmonary effort is normal. No respiratory distress.     Breath sounds: Normal breath sounds. No wheezing, rhonchi or rales.  Chest:  Breasts:     Right: No axillary adenopathy or supraclavicular adenopathy.     Left: No axillary adenopathy or supraclavicular adenopathy.    Abdominal:     General: Bowel sounds are normal. There is no distension.     Palpations: Abdomen is soft. There is no mass (no palpable abdominal passes are  appreciated).     Tenderness: There is no abdominal tenderness.  Musculoskeletal:        General: No swelling.     Right lower leg: No edema.     Left lower leg: No edema.  Lymphadenopathy:     Cervical: No cervical adenopathy.     Upper Body:     Right upper body: No supraclavicular or axillary adenopathy.     Left upper body: No supraclavicular or axillary adenopathy.     Lower Body: No right inguinal adenopathy. No left inguinal adenopathy.  Skin:    General: Skin is warm.     Coloration: Skin is not jaundiced.     Findings: No lesion or rash.  Neurological:     General: No focal deficit present.     Mental Status: He is alert and oriented to person, place, and time. Mental status is at baseline.     Cranial Nerves: Cranial nerves are intact.  Psychiatric:        Mood and Affect: Mood normal.        Thought Content: Thought content normal.     LABS:  Foundation One testing showed that his tumor expresses the FGFR2 mutation, for which there is targeted therapy to treat.     ASSESSMENT & PLAN:  Assessment/Plan:  A 79 y.o. male with metastatic cholangiocarcinoma.  In clinic today, I went over his test results  with him, for which he understands that his cholangiocarcinoma harbors the FGFR2 mutation.  Although there are medications used to treat cholangiocarcinomas harboring this mutation, such as pemigatinib or erdafitinib, they are approved in the 2nd-line setting.  Ultimately, he needs to be placed on front-line chemotherapy first for his palliative disease management.  The regimen I will place him on will consist of cisplatin/gemcitabine.  The patient was made aware of the side effects that go along with this regimen, including nausea, cytopenias, and fatigue.  His 1st cycle will commence within the next 2 weeks.  I will see him back in 3 weeks before he heads into his 2nd cycle of chemotherapy.  The patient understands all the plans discussed today and is in agreement with them.     Tarance Balan Macarthur Critchley, MD

## 2020-09-15 ENCOUNTER — Inpatient Hospital Stay (INDEPENDENT_AMBULATORY_CARE_PROVIDER_SITE_OTHER): Payer: Medicare Other | Admitting: Oncology

## 2020-09-15 ENCOUNTER — Other Ambulatory Visit: Payer: Self-pay | Admitting: Hematology and Oncology

## 2020-09-15 ENCOUNTER — Inpatient Hospital Stay: Payer: Medicare Other

## 2020-09-15 ENCOUNTER — Other Ambulatory Visit: Payer: Self-pay | Admitting: Oncology

## 2020-09-15 ENCOUNTER — Other Ambulatory Visit: Payer: Self-pay

## 2020-09-15 VITALS — BP 135/68 | HR 74 | Temp 98.2°F | Resp 18 | Ht 74.0 in | Wt 300.4 lb

## 2020-09-15 DIAGNOSIS — C240 Malignant neoplasm of extrahepatic bile duct: Secondary | ICD-10-CM

## 2020-09-15 DIAGNOSIS — I255 Ischemic cardiomyopathy: Secondary | ICD-10-CM

## 2020-09-15 LAB — COMPREHENSIVE METABOLIC PANEL
Albumin: 4.2 (ref 3.5–5.0)
Calcium: 9.2 (ref 8.7–10.7)

## 2020-09-15 LAB — CBC AND DIFFERENTIAL
HCT: 41 (ref 41–53)
Hemoglobin: 13.7 (ref 13.5–17.5)
Neutrophils Absolute: 4.9
Platelets: 136 — AB (ref 150–399)
WBC: 7.2

## 2020-09-15 LAB — CBC
MCV: 91 (ref 80–94)
RBC: 4.53 (ref 3.87–5.11)

## 2020-09-15 LAB — BASIC METABOLIC PANEL
BUN: 21 (ref 4–21)
CO2: 27 — AB (ref 13–22)
Chloride: 101 (ref 99–108)
Creatinine: 1.3 (ref 0.6–1.3)
Glucose: 159
Potassium: 4.3 (ref 3.4–5.3)
Sodium: 141 (ref 137–147)

## 2020-09-15 LAB — HEPATIC FUNCTION PANEL
ALT: 20 (ref 10–40)
AST: 29 (ref 14–40)
Alkaline Phosphatase: 51 (ref 25–125)
Bilirubin, Total: 1

## 2020-09-19 ENCOUNTER — Encounter: Payer: Self-pay | Admitting: Oncology

## 2020-09-20 ENCOUNTER — Other Ambulatory Visit: Payer: Self-pay | Admitting: Oncology

## 2020-09-20 DIAGNOSIS — I951 Orthostatic hypotension: Secondary | ICD-10-CM

## 2020-09-20 DIAGNOSIS — C240 Malignant neoplasm of extrahepatic bile duct: Secondary | ICD-10-CM

## 2020-09-20 NOTE — Progress Notes (Signed)
START OFF PATHWAY REGIMEN - Other   [id number]:Cisplatin 80 mg/m2 IV D1 + Gemcitabine 1,000 mg/m2 IV D1,8 q21 Days:   A cycle is every 21 days:     Gemcitabine      Cisplatin   **Always confirm dose/schedule in your pharmacy ordering system**  Patient Characteristics: Intent of Therapy: Non-Curative / Palliative Intent, Discussed with Patient

## 2020-09-26 ENCOUNTER — Encounter: Payer: Self-pay | Admitting: Oncology

## 2020-09-28 ENCOUNTER — Other Ambulatory Visit: Payer: Self-pay | Admitting: Cardiology

## 2020-10-06 DIAGNOSIS — E785 Hyperlipidemia, unspecified: Secondary | ICD-10-CM | POA: Diagnosis not present

## 2020-10-06 DIAGNOSIS — C221 Intrahepatic bile duct carcinoma: Secondary | ICD-10-CM | POA: Diagnosis not present

## 2020-10-06 DIAGNOSIS — I119 Hypertensive heart disease without heart failure: Secondary | ICD-10-CM | POA: Diagnosis not present

## 2020-10-06 DIAGNOSIS — I2510 Atherosclerotic heart disease of native coronary artery without angina pectoris: Secondary | ICD-10-CM | POA: Diagnosis not present

## 2020-10-07 DIAGNOSIS — I2510 Atherosclerotic heart disease of native coronary artery without angina pectoris: Secondary | ICD-10-CM

## 2020-10-07 DIAGNOSIS — E785 Hyperlipidemia, unspecified: Secondary | ICD-10-CM

## 2020-10-07 DIAGNOSIS — I119 Hypertensive heart disease without heart failure: Secondary | ICD-10-CM

## 2020-10-07 DIAGNOSIS — C221 Intrahepatic bile duct carcinoma: Secondary | ICD-10-CM

## 2020-10-14 ENCOUNTER — Telehealth: Payer: Self-pay | Admitting: Hematology and Oncology

## 2020-10-14 ENCOUNTER — Other Ambulatory Visit: Payer: Medicare Other | Admitting: Hematology and Oncology

## 2020-10-14 ENCOUNTER — Telehealth: Payer: Self-pay | Admitting: Oncology

## 2020-10-14 NOTE — Telephone Encounter (Signed)
Patient scheduled for 2/1 Labs, Follow UP 2/3 Infusion.  Requested patient be given updated Appt Summary

## 2020-10-14 NOTE — Telephone Encounter (Signed)
Patient called to verify his next Appt's - Labs, Follow Up w/Melissa, Patient Education 1/24.  Infusion scheduled for 1/27  Patient Confirmed

## 2020-10-16 ENCOUNTER — Other Ambulatory Visit: Payer: Self-pay | Admitting: Cardiology

## 2020-10-17 ENCOUNTER — Inpatient Hospital Stay: Payer: Medicare Other | Admitting: Hematology and Oncology

## 2020-10-17 ENCOUNTER — Encounter: Payer: Self-pay | Admitting: Hematology and Oncology

## 2020-10-17 ENCOUNTER — Other Ambulatory Visit: Payer: Self-pay

## 2020-10-17 ENCOUNTER — Inpatient Hospital Stay: Payer: Medicare Other | Attending: Oncology

## 2020-10-17 DIAGNOSIS — Z5111 Encounter for antineoplastic chemotherapy: Secondary | ICD-10-CM | POA: Insufficient documentation

## 2020-10-17 DIAGNOSIS — C221 Intrahepatic bile duct carcinoma: Secondary | ICD-10-CM | POA: Insufficient documentation

## 2020-10-17 DIAGNOSIS — C786 Secondary malignant neoplasm of retroperitoneum and peritoneum: Secondary | ICD-10-CM | POA: Insufficient documentation

## 2020-10-17 LAB — BASIC METABOLIC PANEL
BUN: 20 (ref 4–21)
CO2: 28 — AB (ref 13–22)
Chloride: 103 (ref 99–108)
Creatinine: 1.2 (ref 0.6–1.3)
Glucose: 148
Potassium: 4.4 (ref 3.4–5.3)
Sodium: 139 (ref 137–147)

## 2020-10-17 LAB — HEPATIC FUNCTION PANEL
ALT: 22 (ref 10–40)
AST: 31 (ref 14–40)
Alkaline Phosphatase: 51 (ref 25–125)
Bilirubin, Total: 0.8

## 2020-10-17 LAB — COMPREHENSIVE METABOLIC PANEL
Albumin: 4.3 (ref 3.5–5.0)
Calcium: 9.5 (ref 8.7–10.7)

## 2020-10-17 LAB — CBC AND DIFFERENTIAL
HCT: 39 — AB (ref 41–53)
Hemoglobin: 13.1 — AB (ref 13.5–17.5)
Neutrophils Absolute: 5.16
Platelets: 142 — AB (ref 150–399)
WBC: 7.7

## 2020-10-17 LAB — CBC: RBC: 4.31 (ref 3.87–5.11)

## 2020-10-17 MED ORDER — ONDANSETRON HCL 4 MG PO TABS: 4.0000 mg | ORAL_TABLET | ORAL | 3 refills | Status: DC | PRN

## 2020-10-17 MED ORDER — PROCHLORPERAZINE MALEATE 10 MG PO TABS: 10.0000 mg | ORAL_TABLET | Freq: Four times a day (QID) | ORAL | 3 refills | Status: DC | PRN

## 2020-10-17 NOTE — Telephone Encounter (Signed)
Refill sent to pharmacy.   

## 2020-10-17 NOTE — Progress Notes (Deleted)
The patient is a with newly diagnosed biliary tract carcinoma.  Patient presents to clinic today for chemotherapy education and palliative care consult.  We will start gemcitabine and cisplatin on 10-20-2020.  We will send in prescriptions for prochlorperazine and ondansetron.  The patient verbalizes understanding of and agreement to the plan as discussed today.  Provided general information including the following: 1.  Date of education: 10-17-2020 2.  Physician name: Dr. Bobby Rumpf 3.  Diagnosis: Biliary tract cancer 4.  Stage: 5.  Curative or palliative 6.  Chemotherapy plan including drugs and how often: Gemcitabine/ Cisplatin every 21 days 7.  Start date: 10-20-2020 8.  Other referrals: No other referrals at this time. 9.  The patient is to call our office with any questions or concerns.  Our office number [phone], if after hours or on the weekend, call the same number and wait for the answering service.  There is always an oncologist on call 10.  Medications prescribed: Zofran and Compazine 11.  The patient has verbalized understanding of the treatment plan and has no barriers to adherence or understanding.  Obtained signed consent from patient.  Discussed symptoms including 1.  Low blood counts including red blood cells, white blood cells and platelets. 2. Infection including to avoid large crowds, wash hands frequently, and stay away from people who were sick.  If fever develops of 100.4 or higher, call our office. 3.  Mucositis-given instructions on mouth rinse (baking soda and salt mixture).  Keep mouth clean.  Use soft bristle toothbrush.  If mouth sores develop, call our clinic. 4.  Nausea/vomiting-gave prescriptions for ondansetron 4 mg every 4 hours as needed for nausea, may take around the clock if persistent.  Compazine 10 mg every 6 hours, may take around the clock if persistent. 5.  Diarrhea-use over-the-counter Imodium.  Call clinic if not controlled. 6.  Constipation-use  senna, 1 to 2 tablets twice a day.  If no BM in 2 to 3 days call the clinic. 7.  Loss of appetite-try to eat small meals every 2-3 hours.  Call clinic if not eating. 8.  Taste changes-zinc 500 mg daily.  If becomes severe call clinic. 9.  Alcoholic beverages. 10.  Drink 2 to 3 quarts of water per day. 11.  Peripheral neuropathy-patient to call if numbness or tingling in hands or feet is persistent   Gave information on the supportive care team and how to contact them regarding services.  Discussed advanced directives.  The patient does not have their advanced directives but will look at the copy provided in their notebook and will call with any questions. [employer] Social worker Advanced directives  Answered questions to patient satisfaction.  Patient is to call with any further questions or concerns.  Time spent on this palliative care/chemotherapy education was 60 minutes with more than 50% spent discussing diagnosis, prognosis and symptom management.

## 2020-10-17 NOTE — Progress Notes (Signed)
[facility]  [address] [place],  [place]  [address] [phone]  Clinic Day:  09/15/2020  Referring physician: Bonnita Nasuti, MD   HISTORY OF PRESENT ILLNESS:  The patient is a 80 y.o. male with metastatic intrahepatic cholangiocarcinoma, which includes peritoneal metastasis and reappearance of disease on the edge of his liver.  He comes in today for evaluation prior to starting his first cycle of gemcitabine/ cisplatin this week. He had an overnight admission with his mediport insertion due to new onset chest pain. All cardiac studies were negative. He reports continued fatigue and weakness, but is very anxious to get his treatment started. He reports shortness of breath since starting Brilinta as prescribed by cardiology. He states he was told by the cardiology nurse that shortness of breath is a common side effect and that "drinking a strong cup of coffee" could relieve this. He does that every morning and states it does in fact help. He denies chest pain or cough. He denies fever, chills, nausea or vomiting. He has chronic constipation for which he will start taking a daily stool softener. He denies issue with bladder. CBC and CMP today are unremarkable. He is due to start his first cycle of treatment on 10-20-2020.  PHYSICAL EXAM:  Blood pressure 135/68, pulse 71, temperature 98.1 F (36.7 C), temperature source Oral, resp. rate 18, height 6' 2" (1.88 m), weight 294 lb 8 oz (133.6 kg), SpO2 94 %. Wt Readings from Last 3 Encounters:  10/17/20 294 lb 8 oz (133.6 kg)  09/15/20 (!) 300 lb 6.4 oz (136.3 kg)  08/29/20 297 lb (134.7 kg)   Body mass index is 37.81 kg/m. Performance status (ECOG): 1 - Symptomatic but completely ambulatory Physical Exam Constitutional:      General: He is not in acute distress.    Appearance: Normal appearance. He is normal weight. He is not ill-appearing, toxic-appearing or diaphoretic.  HENT:     Head: Normocephalic  and atraumatic.     Right Ear: Tympanic membrane normal.     Left Ear: Tympanic membrane normal.     Nose: Nose normal. No congestion or rhinorrhea.     Mouth/Throat:     Mouth: Mucous membranes are moist.     Pharynx: Oropharynx is clear. No oropharyngeal exudate or posterior oropharyngeal erythema.  Eyes:     General: No scleral icterus.       Right eye: No discharge.        Left eye: No discharge.     Extraocular Movements: Extraocular movements intact.     Conjunctiva/sclera: Conjunctivae normal.     Pupils: Pupils are equal, round, and reactive to light.  Neck:     Vascular: No carotid bruit.  Cardiovascular:     Rate and Rhythm: Normal rate and regular rhythm.     Heart sounds: No murmur heard. No friction rub. No gallop.   Pulmonary:     Effort: Pulmonary effort is normal. No respiratory distress.     Breath sounds: Normal breath sounds. No stridor. No wheezing, rhonchi or rales.  Chest:     Chest wall: No tenderness.  Abdominal:     General: Abdomen is flat. Bowel sounds are normal. There is no distension.     Palpations: There is no mass.     Tenderness: There is no abdominal tenderness. There is no right CVA tenderness, left CVA tenderness, guarding or rebound.     Hernia: No hernia is present.  Musculoskeletal:  General: No swelling, tenderness, deformity or signs of injury. Normal range of motion.     Cervical back: Normal range of motion and neck supple. No rigidity or tenderness.     Right lower leg: No edema.     Left lower leg: No edema.  Lymphadenopathy:     Cervical: No cervical adenopathy.  Skin:    General: Skin is warm and dry.     Capillary Refill: Capillary refill takes less than 2 seconds.     Coloration: Skin is not jaundiced or pale.     Findings: No bruising, erythema, lesion or rash.  Neurological:     General: No focal deficit present.     Mental Status: He is alert and oriented to person, place, and time. Mental status is at baseline.      Cranial Nerves: No cranial nerve deficit.     Sensory: No sensory deficit.     Motor: No weakness.     Coordination: Coordination normal.     Gait: Gait normal.     Deep Tendon Reflexes: Reflexes normal.  Psychiatric:        Mood and Affect: Mood normal.        Behavior: Behavior normal.        Thought Content: Thought content normal.        Judgment: Judgment normal.     LABS:  Foundation One testing showed that his tumor expresses the FGFR2 mutation, for which there is targeted therapy to treat.     ASSESSMENT & PLAN:  Assessment/Plan:  A 80 y.o. male with metastatic cholangiocarcinoma.  In clinic today, we discussed his chemotherapy medications in detail including expected side effects and management of these side effects. Prescriptions were sent in for Zofran and Compazine. He was provided with a calendar of appointment dates and times. Both he and his wife who was present for the visit were given opportunities to ask questions. He was given Scientist, clinical (histocompatibility and immunogenetics) for home review. He will return to clinic in 3 weeks prior to his next cycle for repeat CBC, CMP and evaluation with Dr. Bobby Rumpf.   The patient and his wife verbalize understanding of and agreement to the plans discussed today. They know to call the office should any new questions or concerns arise.   Melodye Ped, NP

## 2020-10-19 NOTE — Progress Notes (Signed)
..   Pharmacist Chemotherapy Monitoring - Initial Assessment    Anticipated start date: 10/20/20  Regimen:  . Are orders appropriate based on the patient's diagnosis, regimen, and cycle? YES . Does the plan date match the patient's scheduled date? YES . Is the sequencing of drugs appropriate? YES . Are the premedications appropriate for the patient's regimen? yes . Prior Authorization for treatment is:yes o If applicable, is the correct biosimilar selected based on the patient's insurance? n/a  Organ Function and Labs: Marland Kitchen Are dose adjustments needed based on the patient's renal function, hepatic function, or hematologic function? No, crcl=97, LFTs WNL . Are appropriate labs ordered prior to the start of patient's treatment? yes . Other organ system assessment, if indicated: none The following baseline labs, if indicated, have been ordered:cmp, cbc, mag Dose Assessment: . Are the drug doses appropriate? yes . Are the following correct: o Drug concentrations yes o IV fluid compatible with drug yes o Administration routes yes o Timing of therapy yes . If applicable, does the patient have documented access for treatment and/or plans for port-a-cath placement? YES 10/06/20 . If applicable, have lifetime cumulative doses been properly documented and assessed? n/a Lifetime Dose Tracking  No doses have been documented on this patient for the following tracked chemicals: Doxorubicin, Epirubicin, Idarubicin, Daunorubicin, Mitoxantrone, Bleomycin, Oxaliplatin, Carboplatin, Liposomal Doxorubicin  o   Toxicity Monitoring/Prevention: . The patient has the following take home antiemetics prescribed: ondansetron, prochlorperazine . The patient has the following take home medications prescribed:none . Medication allergies and previous infusion related reactions, if applicable, have been reviewed and addressed. yes The patient's current medication list has been assessed for drug-drug interactions with  their chemotherapy regimen. No DDI identified with chemo. Order Review: . Are the treatment plan orders signed? YES . Is the patient scheduled to see a provider prior to their treatment? YES, chemo ed  I verify that I have reviewed each item in the above checklist and answered each question accordingly.  [place], RPh 10/19/2020 2:16 PM

## 2020-10-20 ENCOUNTER — Inpatient Hospital Stay: Payer: Medicare Other

## 2020-10-20 ENCOUNTER — Other Ambulatory Visit: Payer: Self-pay

## 2020-10-20 VITALS — BP 107/71 | HR 72 | Temp 98.1°F | Resp 18 | Wt 295.5 lb

## 2020-10-20 DIAGNOSIS — C240 Malignant neoplasm of extrahepatic bile duct: Secondary | ICD-10-CM

## 2020-10-20 DIAGNOSIS — I951 Orthostatic hypotension: Secondary | ICD-10-CM

## 2020-10-20 DIAGNOSIS — Z5111 Encounter for antineoplastic chemotherapy: Secondary | ICD-10-CM | POA: Diagnosis present

## 2020-10-20 DIAGNOSIS — C786 Secondary malignant neoplasm of retroperitoneum and peritoneum: Secondary | ICD-10-CM | POA: Diagnosis not present

## 2020-10-20 DIAGNOSIS — C221 Intrahepatic bile duct carcinoma: Secondary | ICD-10-CM | POA: Diagnosis not present

## 2020-10-20 HISTORY — DX: Orthostatic hypotension: I95.1

## 2020-10-20 MED ORDER — HEPARIN SOD (PORK) LOCK FLUSH 100 UNIT/ML IV SOLN
500.0000 [IU] | Freq: Once | INTRAVENOUS | Status: DC | PRN
  Filled 2020-10-20: qty 5

## 2020-10-20 MED ORDER — PALONOSETRON HCL INJECTION 0.25 MG/5ML: 0.2500 mg | Freq: Once | INTRAVENOUS | Status: DC

## 2020-10-20 MED ORDER — SODIUM CHLORIDE 0.9 % IV SOLN
25.0000 mg/m2 | Freq: Once | INTRAVENOUS | Status: DC
  Filled 2020-10-20: qty 67

## 2020-10-20 MED ORDER — SODIUM CHLORIDE 0.9 % IV SOLN
Freq: Once | INTRAVENOUS | Status: AC
  Filled 2020-10-20: qty 250

## 2020-10-20 MED ORDER — SODIUM CHLORIDE 0.9 % IV SOLN
INTRAVENOUS | Status: DC
  Filled 2020-10-20: qty 250

## 2020-10-20 MED ORDER — SODIUM CHLORIDE 0.9 % IV SOLN
Freq: Once | INTRAVENOUS | Status: DC
  Filled 2020-10-20: qty 250

## 2020-10-20 MED ORDER — SODIUM CHLORIDE 0.9 % IV SOLN
1000.0000 mg/m2 | Freq: Once | INTRAVENOUS | Status: DC
  Filled 2020-10-20: qty 70

## 2020-10-20 MED ORDER — MAGNESIUM SULFATE 2 GM/50ML IV SOLN: 2.0000 g | Freq: Once | INTRAVENOUS | Status: DC

## 2020-10-20 MED ORDER — SODIUM CHLORIDE 0.9 % IV SOLN
10.0000 mg | Freq: Once | INTRAVENOUS | Status: DC
  Filled 2020-10-20: qty 1

## 2020-10-20 MED ORDER — POTASSIUM CHLORIDE IN NACL 20-0.9 MEQ/L-% IV SOLN
INTRAVENOUS | Status: AC
  Filled 2020-10-20: qty 1000

## 2020-10-20 MED ORDER — SODIUM CHLORIDE 0.9 % IV SOLN
150.0000 mg | Freq: Once | INTRAVENOUS | Status: DC
  Filled 2020-10-20: qty 5

## 2020-10-20 MED ORDER — POTASSIUM CHLORIDE IN NACL 20-0.9 MEQ/L-% IV SOLN: Freq: Once | INTRAVENOUS | Status: AC

## 2020-10-20 NOTE — Progress Notes (Signed)
Portacath flushed with 14ml NS prior to deaccess.  Pt tolerated well.

## 2020-10-20 NOTE — Patient Instructions (Signed)
Dehydration, Adult Dehydration is a condition in which there is not enough water or other fluids in the body. This happens when a person loses more fluids than he or she takes in. Important organs, such as the kidneys, brain, and heart, cannot function without a proper amount of fluids. Any loss of fluids from the body can lead to dehydration. Dehydration can be mild, moderate, or severe. It should be treated right away to prevent it from becoming severe. What are the causes? Dehydration may be caused by:  Conditions that cause loss of water or other fluids, such as diarrhea, vomiting, or sweating or urinating a lot.  Not drinking enough fluids, especially when you are ill or doing activities that require a lot of energy.  Other illnesses and conditions, such as fever or infection.  Certain medicines, such as medicines that remove excess fluid from the body (diuretics).  Lack of safe drinking water.  Not being able to get enough water and food. What increases the risk? The following factors may make you more likely to develop this condition:  Having a long-term (chronic) illness that has not been treated properly, such as diabetes, heart disease, or kidney disease.  Being 65 years of age or older.  Having a disability.  Living in a place that is high in altitude, where thinner, drier air causes more fluid loss.  Doing exercises that put stress on your body for a long time (endurance sports). What are the signs or symptoms? Symptoms of dehydration depend on how severe it is. Mild or moderate dehydration  Thirst.  Dry lips or dry mouth.  Dizziness or light-headedness, especially when standing up from a seated position.  Muscle cramps.  Dark urine. Urine may be the color of tea.  Less urine or tears produced than usual.  Headache. Severe dehydration  Changes in skin. Your skin may be cold and clammy, blotchy, or pale. Your skin also may not return to normal after being  lightly pinched and released.  Little or no tears, urine, or sweat.  Changes in vital signs, such as rapid breathing and low blood pressure. Your pulse may be weak or may be faster than 100 beats a minute when you are sitting still.  Other changes, such as: ? Feeling very thirsty. ? Sunken eyes. ? Cold hands and feet. ? Confusion. ? Being very tired (lethargic) or having trouble waking from sleep. ? Short-term weight loss. ? Loss of consciousness. How is this diagnosed? This condition is diagnosed based on your symptoms and a physical exam. You may have blood and urine tests to help confirm the diagnosis. How is this treated? Treatment for this condition depends on how severe it is. Treatment should be started right away. Do not wait until dehydration becomes severe. Severe dehydration is an emergency and needs to be treated in a hospital.  Mild or moderate dehydration can be treated at home. You may be asked to: ? Drink more fluids. ? Drink an oral rehydration solution (ORS). This drink helps restore proper amounts of fluids and salts and minerals in the blood (electrolytes).  Severe dehydration can be treated: ? With IV fluids. ? By correcting abnormal levels of electrolytes. This is often done by giving electrolytes through a tube that is passed through your nose and into your stomach (nasogastric tube, or NG tube). ? By treating the underlying cause of dehydration. Follow these instructions at home: Oral rehydration solution If told by your health care provider, drink an ORS:  Make   an ORS by following instructions on the package.  Start by drinking small amounts, about  cup (120 mL) every 5-10 minutes.  Slowly increase how much you drink until you have taken the amount recommended by your health care provider. Eating and drinking  Drink enough clear fluid to keep your urine pale yellow. If you were told to drink an ORS, finish the ORS first and then start slowly drinking  other clear fluids. Drink fluids such as: ? Water. Do not drink only water. Doing that can lead to hyponatremia, which is having too little salt (sodium) in the body. ? Water from ice chips you suck on. ? Fruit juice that you have added water to (diluted fruit juice). ? Low-calorie sports drinks.  Eat foods that contain a healthy balance of electrolytes, such as bananas, oranges, potatoes, tomatoes, and spinach.  Do not drink alcohol.  Avoid the following: ? Drinks that contain a lot of sugar. These include high-calorie sports drinks, fruit juice that is not diluted, and soda. ? Caffeine. ? Foods that are greasy or contain a lot of fat or sugar.         General instructions  Take over-the-counter and prescription medicines only as told by your health care provider.  Do not take sodium tablets. Doing that can lead to having too much sodium in the body (hypernatremia).  Return to your normal activities as told by your health care provider. Ask your health care provider what activities are safe for you.  Keep all follow-up visits as told by your health care provider. This is important. Contact a health care provider if:  You have muscle cramps, pain, or discomfort, such as: ? Pain in your abdomen and the pain gets worse or stays in one area (localizes). ? Stiff neck.  You have a rash.  You are more irritable than usual.  You are sleepier or have a harder time waking than usual.  You feel weak or dizzy.  You feel very thirsty. Get help right away if you have:  Any symptoms of severe dehydration.  Symptoms of vomiting, such as: ? You cannot eat or drink without vomiting. ? Vomiting gets worse or does not go away. ? Vomit includes blood or green matter (bile).  Symptoms that get worse with treatment.  A fever.  A severe headache.  Problems with urination or bowel movements, such as: ? Diarrhea that gets worse or does not go away. ? Blood in your stool (feces).  This may cause stool to look black and tarry. ? Not urinating, or urinating only a small amount of very dark urine, within 6-8 hours.  Trouble breathing. These symptoms may represent a serious problem that is an emergency. Do not wait to see if the symptoms will go away. Get medical help right away. Call your local emergency services (911 in the [place] Do not drive yourself to the hospital. Summary  Dehydration is a condition in which there is not enough water or other fluids in the body. This happens when a person loses more fluids than he or she takes in.  Treatment for this condition depends on how severe it is. Treatment should be started right away. Do not wait until dehydration becomes severe.  Drink enough clear fluid to keep your urine pale yellow. If you were told to drink an oral rehydration solution (ORS), finish the ORS first and then start slowly drinking other clear fluids.  Take over-the-counter and prescription medicines only as told by your health   care provider.  Get help right away if you have any symptoms of severe dehydration. This information is not intended to replace advice given to you by your health care provider. Make sure you discuss any questions you have with your health care provider. Document Revised: 04/23/2019 Document Reviewed: 04/23/2019 Elsevier Patient Education  2021 [employer].  

## 2020-10-20 NOTE — Progress Notes (Signed)
Pt reports episodes of dizziness over last couple of days.  Poor po intake x past 3 weeks per pt.  Unable to verify meds, pt to bring med list next visit.

## 2020-10-21 ENCOUNTER — Other Ambulatory Visit: Payer: Self-pay | Admitting: Pharmacist

## 2020-10-21 ENCOUNTER — Telehealth: Payer: Self-pay

## 2020-10-21 ENCOUNTER — Inpatient Hospital Stay: Payer: Medicare Other

## 2020-10-21 VITALS — BP 143/80 | HR 73 | Temp 97.8°F | Resp 18 | Wt 302.8 lb

## 2020-10-21 DIAGNOSIS — Z5111 Encounter for antineoplastic chemotherapy: Secondary | ICD-10-CM | POA: Diagnosis not present

## 2020-10-21 DIAGNOSIS — C240 Malignant neoplasm of extrahepatic bile duct: Secondary | ICD-10-CM

## 2020-10-21 MED ORDER — SODIUM CHLORIDE 0.9 % IV SOLN
Freq: Once | INTRAVENOUS | Status: AC
  Filled 2020-10-21: qty 250

## 2020-10-21 MED ORDER — CISPLATIN CHEMO INJECTION 100MG/100ML
25.0000 mg/m2 | Freq: Once | INTRAVENOUS | Status: AC
  Administered 2020-10-21: 67 mg via INTRAVENOUS
  Filled 2020-10-21: qty 67

## 2020-10-21 MED ORDER — SODIUM CHLORIDE 0.9 % IV SOLN
1000.0000 mg/m2 | Freq: Once | INTRAVENOUS | Status: AC
  Administered 2020-10-21: 2660 mg via INTRAVENOUS
  Filled 2020-10-21: qty 52.6

## 2020-10-21 MED ORDER — PALONOSETRON HCL INJECTION 0.25 MG/5ML
INTRAVENOUS | Status: AC
  Filled 2020-10-21: qty 5

## 2020-10-21 MED ORDER — MAGNESIUM SULFATE 2 GM/50ML IV SOLN
INTRAVENOUS | Status: AC
  Filled 2020-10-21: qty 50

## 2020-10-21 MED ORDER — SODIUM CHLORIDE 0.9 % IV SOLN
150.0000 mg | Freq: Once | INTRAVENOUS | Status: AC
  Administered 2020-10-21: 150 mg via INTRAVENOUS
  Filled 2020-10-21: qty 5

## 2020-10-21 MED ORDER — POTASSIUM CHLORIDE IN NACL 20-0.9 MEQ/L-% IV SOLN
INTRAVENOUS | Status: AC
  Filled 2020-10-21: qty 1000

## 2020-10-21 MED ORDER — POTASSIUM CHLORIDE IN NACL 20-0.9 MEQ/L-% IV SOLN: Freq: Once | INTRAVENOUS | Status: AC

## 2020-10-21 MED ORDER — MAGNESIUM SULFATE 2 GM/50ML IV SOLN
2.0000 g | Freq: Once | INTRAVENOUS | Status: AC
  Administered 2020-10-21: 2 g via INTRAVENOUS

## 2020-10-21 MED ORDER — SODIUM CHLORIDE 0.9 % IV SOLN
10.0000 mg | Freq: Once | INTRAVENOUS | Status: AC
  Administered 2020-10-21: 10 mg via INTRAVENOUS
  Filled 2020-10-21: qty 1

## 2020-10-21 MED ORDER — PALONOSETRON HCL INJECTION 0.25 MG/5ML
0.2500 mg | Freq: Once | INTRAVENOUS | Status: AC
  Administered 2020-10-21: 0.25 mg via INTRAVENOUS

## 2020-10-21 NOTE — Telephone Encounter (Signed)
I spoke with pt on his way home from chemo infusion. He states he feels good, much better than yesterday. Pt was scheduled to get his Cisplatin yesterday, but when he arrived to infusion center his BP was 70/50. He was given IVF yesterday, and treatment today. I reminded pt of how important it is to call 24/7 if temp over 100.4 @ [phone]. Pt verbalized understanding.

## 2020-10-21 NOTE — Patient Instructions (Signed)
[facility] Discharge Instructions for Patients Receiving Chemotherapy  Today you received the following chemotherapy agents Cisplatin, Gemcitabine.  To help prevent nausea and vomiting after your treatment, we encourage you to take your nausea medication. Gemcitabine injection What is this medicine? GEMCITABINE (jem SYE ta been) is a chemotherapy drug. This medicine is used to treat many types of cancer like breast cancer, lung cancer, pancreatic cancer, and ovarian cancer. This medicine may be used for other purposes; ask your health care provider or pharmacist if you have questions. COMMON BRAND NAME(S): Gemzar, Infugem What should I tell my health care provider before I take this medicine? They need to know if you have any of these conditions:  blood disorders  infection  kidney disease  liver disease  lung or breathing disease, like asthma  recent or ongoing radiation therapy  an unusual or allergic reaction to gemcitabine, other chemotherapy, other medicines, foods, dyes, or preservatives  pregnant or trying to get pregnant  breast-feeding How should I use this medicine? This drug is given as an infusion into a vein. It is administered in a hospital or clinic by a specially trained health care professional. Talk to your pediatrician regarding the use of this medicine in children. Special care may be needed. Overdosage: If you think you have taken too much of this medicine contact a poison control center or emergency room at once. NOTE: This medicine is only for you. Do not share this medicine with others. What if I miss a dose? It is important not to miss your dose. Call your doctor or health care professional if you are unable to keep an appointment. What may interact with this medicine?  medicines to increase blood counts like filgrastim, pegfilgrastim, sargramostim  some other chemotherapy drugs like cisplatin  vaccines Talk to your  doctor or health care professional before taking any of these medicines:  acetaminophen  aspirin  ibuprofen  ketoprofen  naproxen This list may not describe all possible interactions. Give your health care provider a list of all the medicines, herbs, non-prescription drugs, or dietary supplements you use. Also tell them if you smoke, drink alcohol, or use illegal drugs. Some items may interact with your medicine. What should I watch for while using this medicine? Visit your doctor for checks on your progress. This drug may make you feel generally unwell. This is not uncommon, as chemotherapy can affect healthy cells as well as cancer cells. Report any side effects. Continue your course of treatment even though you feel ill unless your doctor tells you to stop. In some cases, you may be given additional medicines to help with side effects. Follow all directions for their use. Call your doctor or health care professional for advice if you get a fever, chills or sore throat, or other symptoms of a cold or flu. Do not treat yourself. This drug decreases your body's ability to fight infections. Try to avoid being around people who are sick. This medicine may increase your risk to bruise or bleed. Call your doctor or health care professional if you notice any unusual bleeding. Be careful brushing and flossing your teeth or using a toothpick because you may get an infection or bleed more easily. If you have any dental work done, tell your dentist you are receiving this medicine. Avoid taking products that contain aspirin, acetaminophen, ibuprofen, naproxen, or ketoprofen unless instructed by your doctor. These medicines may hide a fever. Do not become pregnant while taking this medicine or  for 6 months after stopping it. Women should inform their doctor if they wish to become pregnant or think they might be pregnant. Men should not father a child while taking this medicine and for 3 months after stopping  it. There is a potential for serious side effects to an unborn child. Talk to your health care professional or pharmacist for more information. Do not breast-feed an infant while taking this medicine or for at least 1 week after stopping it. Men should inform their doctors if they wish to father a child. This medicine may lower sperm counts. Talk with your doctor or health care professional if you are concerned about your fertility. What side effects may I notice from receiving this medicine? Side effects that you should report to your doctor or health care professional as soon as possible:  allergic reactions like skin rash, itching or hives, swelling of the face, lips, or tongue  breathing problems  pain, redness, or irritation at site where injected  signs and symptoms of a dangerous change in heartbeat or heart rhythm like chest pain; dizziness; fast or irregular heartbeat; palpitations; feeling faint or lightheaded, falls; breathing problems  signs of decreased platelets or bleeding - bruising, pinpoint red spots on the skin, black, tarry stools, blood in the urine  signs of decreased red blood cells - unusually weak or tired, feeling faint or lightheaded, falls  signs of infection - fever or chills, cough, sore throat, pain or difficulty passing urine  signs and symptoms of kidney injury like trouble passing urine or change in the amount of urine  signs and symptoms of liver injury like dark yellow or brown urine; general ill feeling or flu-like symptoms; light-colored stools; loss of appetite; nausea; right upper belly pain; unusually weak or tired; yellowing of the eyes or skin  swelling of ankles, feet, hands Side effects that usually do not require medical attention (report to your doctor or health care professional if they continue or are bothersome):  constipation  diarrhea  hair loss  loss of appetite  nausea  rash  vomiting This list may not describe all possible  side effects. Call your doctor for medical advice about side effects. You may report side effects to FDA at 1-800-FDA-1088. Where should I keep my medicine? This drug is given in a hospital or clinic and will not be stored at home. NOTE: This sheet is a summary. It may not cover all possible information. If you have questions about this medicine, talk to your doctor, pharmacist, or health care provider.  2021 Elsevier/Gold Standard (2017-12-04 18:06:11)    Cisplatin injection What is this medicine? CISPLATIN (SIS pla tin) is a chemotherapy drug. It targets fast dividing cells, like cancer cells, and causes these cells to die. This medicine is used to treat many types of cancer like bladder, ovarian, and testicular cancers. This medicine may be used for other purposes; ask your health care provider or pharmacist if you have questions. COMMON BRAND NAME(S): Platinol, Platinol -AQ What should I tell my health care provider before I take this medicine? They need to know if you have any of these conditions:  eye disease, vision problems  hearing problems  kidney disease  low blood counts, like white cells, platelets, or red blood cells  tingling of the fingers or toes, or other nerve disorder  an unusual or allergic reaction to cisplatin, carboplatin, oxaliplatin, other medicines, foods, dyes, or preservatives  pregnant or trying to get pregnant  breast-feeding How should I  use this medicine? This drug is given as an infusion into a vein. It is administered in a hospital or clinic by a specially trained health care professional. Talk to your pediatrician regarding the use of this medicine in children. Special care may be needed. Overdosage: If you think you have taken too much of this medicine contact a poison control center or emergency room at once. NOTE: This medicine is only for you. Do not share this medicine with others. What if I miss a dose? It is important not to miss a  dose. Call your doctor or health care professional if you are unable to keep an appointment. What may interact with this medicine? This medicine may interact with the following medications:  foscarnet  certain antibiotics like amikacin, gentamicin, neomycin, polymyxin B, streptomycin, tobramycin, vancomycin This list may not describe all possible interactions. Give your health care provider a list of all the medicines, herbs, non-prescription drugs, or dietary supplements you use. Also tell them if you smoke, drink alcohol, or use illegal drugs. Some items may interact with your medicine. What should I watch for while using this medicine? Your condition will be monitored carefully while you are receiving this medicine. You will need important blood work done while you are taking this medicine. This drug may make you feel generally unwell. This is not uncommon, as chemotherapy can affect healthy cells as well as cancer cells. Report any side effects. Continue your course of treatment even though you feel ill unless your doctor tells you to stop. This medicine may increase your risk of getting an infection. Call your healthcare professional for advice if you get a fever, chills, or sore throat, or other symptoms of a cold or flu. Do not treat yourself. Try to avoid being around people who are sick. Avoid taking medicines that contain aspirin, acetaminophen, ibuprofen, naproxen, or ketoprofen unless instructed by your healthcare professional. These medicines may hide a fever. This medicine may increase your risk to bruise or bleed. Call your doctor or health care professional if you notice any unusual bleeding. Be careful brushing and flossing your teeth or using a toothpick because you may get an infection or bleed more easily. If you have any dental work done, tell your dentist you are receiving this medicine. Do not become pregnant while taking this medicine or for 14 months after stopping it. Women  should inform their healthcare professional if they wish to become pregnant or think they might be pregnant. Men should not father a child while taking this medicine and for 11 months after stopping it. There is potential for serious side effects to an unborn child. Talk to your healthcare professional for more information. Do not breast-feed an infant while taking this medicine. This medicine has caused ovarian failure in some women. This medicine may make it more difficult to get pregnant. Talk to your healthcare professional if you are concerned about your fertility. This medicine has caused decreased sperm counts in some men. This may make it more difficult to father a child. Talk to your healthcare professional if you are concerned about your fertility. Drink fluids as directed while you are taking this medicine. This will help protect your kidneys. Call your doctor or health care professional if you get diarrhea. Do not treat yourself. What side effects may I notice from receiving this medicine? Side effects that you should report to your doctor or health care professional as soon as possible:  allergic reactions like skin rash, itching or hives,  swelling of the face, lips, or tongue  blurred vision  changes in vision  decreased hearing or ringing of the ears  nausea, vomiting  pain, redness, or irritation at site where injected  pain, tingling, numbness in the hands or feet  signs and symptoms of bleeding such as bloody or black, tarry stools; red or dark brown urine; spitting up blood or brown material that looks like coffee grounds; red spots on the skin; unusual bruising or bleeding from the eyes, gums, or nose  signs and symptoms of infection like fever; chills; cough; sore throat; pain or trouble passing urine  signs and symptoms of kidney injury like trouble passing urine or change in the amount of urine  signs and symptoms of low red blood cells or anemia such as unusually  weak or tired; feeling faint or lightheaded; falls; breathing problems Side effects that usually do not require medical attention (report to your doctor or health care professional if they continue or are bothersome):  loss of appetite  mouth sores  muscle cramps This list may not describe all possible side effects. Call your doctor for medical advice about side effects. You may report side effects to FDA at 1-800-FDA-1088. Where should I keep my medicine? This drug is given in a hospital or clinic and will not be stored at home. NOTE: This sheet is a summary. It may not cover all possible information. If you have questions about this medicine, talk to your doctor, pharmacist, or health care provider.  2021 Elsevier/Gold Standard (2018-09-05 15:59:17)    If you develop nausea and vomiting that is not controlled by your nausea medication, call the clinic.   BELOW ARE SYMPTOMS THAT SHOULD BE REPORTED IMMEDIATELY:  *FEVER GREATER THAN 100.5 F  *CHILLS WITH OR WITHOUT FEVER  NAUSEA AND VOMITING THAT IS NOT CONTROLLED WITH YOUR NAUSEA MEDICATION  *UNUSUAL SHORTNESS OF BREATH  *UNUSUAL BRUISING OR BLEEDING  TENDERNESS IN MOUTH AND THROAT WITH OR WITHOUT PRESENCE OF ULCERS  *URINARY PROBLEMS  *BOWEL PROBLEMS  UNUSUAL RASH Items with * indicate a potential emergency and should be followed up as soon as possible.  Feel free to call the clinic should you have any questions or concerns at The clinic phone number is [phone].  Please show the [facility] at check-in to the Emergency Department and triage nurse.

## 2020-10-24 ENCOUNTER — Other Ambulatory Visit: Payer: Self-pay | Admitting: Oncology

## 2020-10-24 ENCOUNTER — Other Ambulatory Visit: Payer: Self-pay

## 2020-10-24 ENCOUNTER — Telehealth: Payer: Self-pay

## 2020-10-24 DIAGNOSIS — T451X5A Adverse effect of antineoplastic and immunosuppressive drugs, initial encounter: Secondary | ICD-10-CM

## 2020-10-24 DIAGNOSIS — R110 Nausea: Secondary | ICD-10-CM

## 2020-10-24 MED ORDER — ONDANSETRON HCL 4 MG PO TABS: 4.0000 mg | ORAL_TABLET | ORAL | 2 refills | Status: DC | PRN

## 2020-10-24 NOTE — Telephone Encounter (Addendum)
I spoke with pt earlier. He said, "I did well over weekend until last night. I started having diarrhea and little nausea". I asked pt what he had been using for nausea and diarrhea. He replied I'm using the Compazine. I called the pharmacy and the Zofran wasn't called in. He had not taking anything for the diarrhea, so I gave him the following instructions. Pt will take 2 Imodium after the first loose BM daily. He will then take 1 Imodium for each loose BM thereafter, up to 8 tabs per day. Pt verbalized understanding. I also encouraged him to drink a cup of fluid for every time he has loose BM.He has an appt here tomorrow to see Dr Bobby Rumpf for labs, & f/u.    I attempted call to pt, to see how he has been doing since infusions last week. No answer.

## 2020-10-24 NOTE — Telephone Encounter (Signed)
sent 

## 2020-10-24 NOTE — Progress Notes (Incomplete)
[facility]  [address] [place],  [place]  [address] [phone]  Clinic Day:  09/15/2020  Referring physician: No ref. provider found   HISTORY OF PRESENT ILLNESS:  The patient is a 80 y.o. male with metastatic FGFR positive intrahepatic cholangiocarcinoma, which includes peritoneal metastasis and reappearance of disease on the edge of his liver.  He comes in today to be evaluated before heading into cycle 1, day 8 of cisplatin/gemcitabine.  That patient claims to have tolerated his 1st dose of this regimen  PHYSICAL EXAM:  There were no vitals taken for this visit. Wt Readings from Last 3 Encounters:  10/21/20 (!) 302 lb 12 oz (137.3 kg)  10/20/20 295 lb 8 oz (134 kg)  10/17/20 294 lb 8 oz (133.6 kg)   There is no height or weight on file to calculate BMI. Performance status (ECOG): 1 - Symptomatic but completely ambulatory Physical Exam Constitutional:      Appearance: Normal appearance. He is not ill-appearing.  HENT:     Mouth/Throat:     Mouth: Mucous membranes are moist.     Pharynx: Oropharynx is clear. No oropharyngeal exudate or posterior oropharyngeal erythema.  Cardiovascular:     Rate and Rhythm: Normal rate and regular rhythm.     Heart sounds: No murmur heard. No friction rub. No gallop.   Pulmonary:     Effort: Pulmonary effort is normal. No respiratory distress.     Breath sounds: Normal breath sounds. No wheezing, rhonchi or rales.  Chest:  Breasts:     Right: No axillary adenopathy or supraclavicular adenopathy.     Left: No axillary adenopathy or supraclavicular adenopathy.    Abdominal:     General: Bowel sounds are normal. There is no distension.     Palpations: Abdomen is soft. There is no mass (no palpable abdominal passes are appreciated).     Tenderness: There is no abdominal tenderness.  Musculoskeletal:        General: No swelling.     Right lower leg: No edema.     Left lower leg: No edema.   Lymphadenopathy:     Cervical: No cervical adenopathy.     Upper Body:     Right upper body: No supraclavicular or axillary adenopathy.     Left upper body: No supraclavicular or axillary adenopathy.     Lower Body: No right inguinal adenopathy. No left inguinal adenopathy.  Skin:    General: Skin is warm.     Coloration: Skin is not jaundiced.     Findings: No lesion or rash.  Neurological:     General: No focal deficit present.     Mental Status: He is alert and oriented to person, place, and time. Mental status is at baseline.     Cranial Nerves: Cranial nerves are intact.  Psychiatric:        Mood and Affect: Mood normal.        Thought Content: Thought content normal.     LABS:     ASSESSMENT & PLAN:  Assessment/Plan:  A 80 y.o. male with FGFR2 mutation positive metastatic cholangiocarcinoma.  He will proceed with cycle 1, day 8 of cisplatin/gemcitabine this week.  Clinically, the patient is doing well.  I will see him back in 2 weeks before he heads into his 2nd cycle of cisplatin/gemcitabine.  The patient understands all the plans discussed today and is in agreement with them.    Milledge Gerding Macarthur Critchley, MD

## 2020-10-25 ENCOUNTER — Other Ambulatory Visit: Payer: Self-pay | Admitting: Oncology

## 2020-10-25 ENCOUNTER — Inpatient Hospital Stay (INDEPENDENT_AMBULATORY_CARE_PROVIDER_SITE_OTHER): Payer: Medicare Other | Admitting: Oncology

## 2020-10-25 ENCOUNTER — Other Ambulatory Visit: Payer: Self-pay

## 2020-10-25 ENCOUNTER — Other Ambulatory Visit: Payer: Self-pay | Admitting: Hematology and Oncology

## 2020-10-25 ENCOUNTER — Telehealth: Payer: Self-pay | Admitting: Oncology

## 2020-10-25 ENCOUNTER — Inpatient Hospital Stay: Payer: Medicare Other | Attending: Oncology

## 2020-10-25 VITALS — BP 133/68 | HR 81 | Temp 98.8°F | Resp 16 | Ht 74.0 in | Wt 294.9 lb

## 2020-10-25 DIAGNOSIS — C786 Secondary malignant neoplasm of retroperitoneum and peritoneum: Secondary | ICD-10-CM | POA: Insufficient documentation

## 2020-10-25 DIAGNOSIS — C221 Intrahepatic bile duct carcinoma: Secondary | ICD-10-CM | POA: Insufficient documentation

## 2020-10-25 DIAGNOSIS — C240 Malignant neoplasm of extrahepatic bile duct: Secondary | ICD-10-CM | POA: Diagnosis not present

## 2020-10-25 DIAGNOSIS — Z5111 Encounter for antineoplastic chemotherapy: Secondary | ICD-10-CM | POA: Insufficient documentation

## 2020-10-25 LAB — CBC AND DIFFERENTIAL
HCT: 37 — AB (ref 41–53)
Hemoglobin: 12.8 — AB (ref 13.5–17.5)
Neutrophils Absolute: 7.48
Platelets: 137 — AB (ref 150–399)
WBC: 8.8

## 2020-10-25 LAB — BASIC METABOLIC PANEL
BUN: 24 — AB (ref 4–21)
CO2: 31 — AB (ref 13–22)
Chloride: 99 (ref 99–108)
Creatinine: 1.3 (ref 0.6–1.3)
Glucose: 163
Potassium: 3.9 (ref 3.4–5.3)
Sodium: 136 — AB (ref 137–147)

## 2020-10-25 LAB — COMPREHENSIVE METABOLIC PANEL
Albumin: 4.1 (ref 3.5–5.0)
Calcium: 8.9 (ref 8.7–10.7)

## 2020-10-25 LAB — HEPATIC FUNCTION PANEL
ALT: 45 — AB (ref 10–40)
AST: 38 (ref 14–40)
Alkaline Phosphatase: 49 (ref 25–125)
Bilirubin, Total: 1.5

## 2020-10-25 LAB — CBC: RBC: 4.15 (ref 3.87–5.11)

## 2020-10-25 NOTE — Progress Notes (Signed)
[facility]  [address] [place],  [place]  [address] [phone]  Clinic Day:  09/15/2020  Referring physician: Bonnita Nasuti, MD   HISTORY OF PRESENT ILLNESS:  The patient is a 80 y.o. 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  He recently started cisplatin/gemcitabine last week, for which he is receiving it weekly, every 2/3 weeks.  He is to receive day 8 of his 1st cycle of chemotherapy this week.  He tolerated his 1st dose fairly well.  He denies having new symptoms or findings which concern him for overt signs of disease progression.    PHYSICAL EXAM:  Blood pressure 133/68, pulse 81, temperature 98.8 F (37.1 C), resp. rate 16, height $RemoveBe'6\' 2"'dDLBWXbCz$  (1.88 m), weight 294 lb 14.4 oz (133.8 kg), SpO2 94 %. Wt Readings from Last 3 Encounters:  10/25/20 294 lb 14.4 oz (133.8 kg)  10/21/20 (!) 302 lb 12 oz (137.3 kg)  10/20/20 295 lb 8 oz (134 kg)   Body mass index is 37.86 kg/m. Performance status (ECOG): 1 - Symptomatic but completely ambulatory Physical Exam Constitutional:      Appearance: Normal appearance. He is not ill-appearing.  HENT:     Mouth/Throat:     Mouth: Mucous membranes are moist.     Pharynx: Oropharynx is clear. No oropharyngeal exudate or posterior oropharyngeal erythema.  Cardiovascular:     Rate and Rhythm: Normal rate and regular rhythm.     Heart sounds: No murmur heard. No friction rub. No gallop.   Pulmonary:     Effort: Pulmonary effort is normal. No respiratory distress.     Breath sounds: Normal breath sounds. No wheezing, rhonchi or rales.  Chest:  Breasts:     Right: No axillary adenopathy or supraclavicular adenopathy.     Left: No axillary adenopathy or supraclavicular adenopathy.    Abdominal:     General: Bowel sounds are normal. There is no distension.     Palpations: Abdomen is  soft. There is no mass (no palpable abdominal passes are appreciated).     Tenderness: There is no abdominal tenderness.  Musculoskeletal:        General: No swelling.     Right lower leg: No edema.     Left lower leg: No edema.  Lymphadenopathy:     Cervical: No cervical adenopathy.     Upper Body:     Right upper body: No supraclavicular or axillary adenopathy.     Left upper body: No supraclavicular or axillary adenopathy.     Lower Body: No right inguinal adenopathy. No left inguinal adenopathy.  Skin:    General: Skin is warm.     Coloration: Skin is not jaundiced.     Findings: No lesion or rash.  Neurological:     General: No focal deficit present.     Mental Status: He is alert and oriented to person, place, and time. Mental status is at baseline.     Cranial Nerves: Cranial nerves are intact.  Psychiatric:        Mood and Affect: Mood normal.        Thought Content: Thought content normal.    LABS:     ASSESSMENT & PLAN:  Assessment/Plan:  A 80 y.o. male with metastatic FGFR2 mutation positive cholangiocarcinoma.  Cycle 1, day 8 of cisplatin/gemcitabine will be given this week.  Clinically, he appears to be doing  well.  I will see him back in a few weeks before he heads into his 2nd cycle of chemotherapy.  The patient understands all the plans discussed today and is in agreement with them.    Jernie Schutt Macarthur Critchley, MD

## 2020-10-25 NOTE — Telephone Encounter (Signed)
Per 2/1 los next appt sched and given to patient 

## 2020-10-27 ENCOUNTER — Other Ambulatory Visit: Payer: Self-pay

## 2020-10-27 ENCOUNTER — Inpatient Hospital Stay: Payer: Medicare Other

## 2020-10-27 VITALS — BP 140/72 | HR 75 | Temp 98.5°F | Resp 16 | Ht 74.0 in | Wt 299.0 lb

## 2020-10-27 DIAGNOSIS — Z5111 Encounter for antineoplastic chemotherapy: Secondary | ICD-10-CM | POA: Diagnosis present

## 2020-10-27 DIAGNOSIS — C240 Malignant neoplasm of extrahepatic bile duct: Secondary | ICD-10-CM

## 2020-10-27 DIAGNOSIS — C786 Secondary malignant neoplasm of retroperitoneum and peritoneum: Secondary | ICD-10-CM | POA: Diagnosis not present

## 2020-10-27 DIAGNOSIS — C221 Intrahepatic bile duct carcinoma: Secondary | ICD-10-CM | POA: Diagnosis not present

## 2020-10-27 MED ORDER — CISPLATIN CHEMO INJECTION 100MG/100ML
25.0000 mg/m2 | Freq: Once | INTRAVENOUS | Status: AC
  Administered 2020-10-27: 67 mg via INTRAVENOUS
  Filled 2020-10-27: qty 67

## 2020-10-27 MED ORDER — MAGNESIUM SULFATE 2 GM/50ML IV SOLN
INTRAVENOUS | Status: AC
  Filled 2020-10-27: qty 50

## 2020-10-27 MED ORDER — SODIUM CHLORIDE 0.9 % IV SOLN
1000.0000 mg/m2 | Freq: Once | INTRAVENOUS | Status: AC
  Administered 2020-10-27: 2660 mg via INTRAVENOUS
  Filled 2020-10-27: qty 52.6

## 2020-10-27 MED ORDER — POTASSIUM CHLORIDE IN NACL 20-0.9 MEQ/L-% IV SOLN: Freq: Once | INTRAVENOUS | Status: AC

## 2020-10-27 MED ORDER — SODIUM CHLORIDE 0.9 % IV SOLN
10.0000 mg | Freq: Once | INTRAVENOUS | Status: AC
  Administered 2020-10-27: 10 mg via INTRAVENOUS
  Filled 2020-10-27: qty 1

## 2020-10-27 MED ORDER — POTASSIUM CHLORIDE IN NACL 20-0.9 MEQ/L-% IV SOLN
INTRAVENOUS | Status: AC
  Filled 2020-10-27: qty 1000

## 2020-10-27 MED ORDER — PALONOSETRON HCL INJECTION 0.25 MG/5ML
INTRAVENOUS | Status: AC
  Filled 2020-10-27: qty 5

## 2020-10-27 MED ORDER — MAGNESIUM SULFATE 2 GM/50ML IV SOLN
2.0000 g | Freq: Once | INTRAVENOUS | Status: AC
  Administered 2020-10-27: 2 g via INTRAVENOUS

## 2020-10-27 MED ORDER — PALONOSETRON HCL INJECTION 0.25 MG/5ML
0.2500 mg | Freq: Once | INTRAVENOUS | Status: AC
  Administered 2020-10-27: 0.25 mg via INTRAVENOUS

## 2020-10-27 MED ORDER — SODIUM CHLORIDE 0.9 % IV SOLN
150.0000 mg | Freq: Once | INTRAVENOUS | Status: AC
  Administered 2020-10-27: 150 mg via INTRAVENOUS
  Filled 2020-10-27: qty 5

## 2020-10-27 MED ORDER — SODIUM CHLORIDE 0.9 % IV SOLN
Freq: Once | INTRAVENOUS | Status: AC
  Filled 2020-10-27: qty 250

## 2020-10-27 MED ORDER — SODIUM CHLORIDE 0.9% FLUSH
10.0000 mL | INTRAVENOUS | Status: DC | PRN
  Filled 2020-10-27: qty 10

## 2020-10-27 NOTE — Progress Notes (Signed)
1530 PT STABLE AT TIME OF DISCHARGE. 

## 2020-10-27 NOTE — Patient Instructions (Signed)
[facility] Discharge Instructions for Patients Receiving Chemotherapy  Today you received the following chemotherapy agents Cisplatin  To help prevent nausea and vomiting after your treatment, we encourage you to take your nausea medication as directed.   If you develop nausea and vomiting that is not controlled by your nausea medication, call the clinic.   BELOW ARE SYMPTOMS THAT SHOULD BE REPORTED IMMEDIATELY:  *FEVER GREATER THAN 100.5 F  *CHILLS WITH OR WITHOUT FEVER  NAUSEA AND VOMITING THAT IS NOT CONTROLLED WITH YOUR NAUSEA MEDICATION  *UNUSUAL SHORTNESS OF BREATH  *UNUSUAL BRUISING OR BLEEDING  TENDERNESS IN MOUTH AND THROAT WITH OR WITHOUT PRESENCE OF ULCERS  *URINARY PROBLEMS  *BOWEL PROBLEMS  UNUSUAL RASH Items with * indicate a potential emergency and should be followed up as soon as possible.  Feel free to call the clinic should you have any questions or concerns at The clinic phone number is [phone].  Please show the [facility] at check-in to the Emergency Department and triage nurse.

## 2020-10-31 ENCOUNTER — Other Ambulatory Visit: Payer: Self-pay | Admitting: Cardiology

## 2020-11-10 NOTE — Progress Notes (Signed)
[facility]  [address] [place],  [place]  [address] [phone]  Clinic Day:  11/11/2020  Referring physician: Bonnita Nasuti, MD   HISTORY OF PRESENT ILLNESS:  The patient is a 80 y.o. 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  He currently takes cisplatin/gemcitabine, which he receives weekly, every 2/3 weeks.  The patient claims to have tolerated his 1st cycle fairly well.  Other than a few days of fatigue, he denies having any pertinent side effects from his chemotherapy.  He denies having new symptoms or findings which concern him for overt signs of disease progression.    PHYSICAL EXAM:  Blood pressure 124/81, pulse 75, temperature 98.6 F (37 C), resp. rate 18, height $RemoveBe'6\' 2"'PMaqMFRvt$  (1.88 m), weight 297 lb 4.8 oz (134.9 kg), SpO2 94 %. Wt Readings from Last 3 Encounters:  11/15/20 296 lb (134.3 kg)  11/11/20 297 lb 4.8 oz (134.9 kg)  10/27/20 299 lb (135.6 kg)   Body mass index is 38.17 kg/m. Performance status (ECOG): 1 - Symptomatic but completely ambulatory Physical Exam Constitutional:      Appearance: Normal appearance. He is not ill-appearing.  HENT:     Mouth/Throat:     Mouth: Mucous membranes are moist.     Pharynx: Oropharynx is clear. No oropharyngeal exudate or posterior oropharyngeal erythema.  Cardiovascular:     Rate and Rhythm: Normal rate and regular rhythm.     Heart sounds: No murmur heard. No friction rub. No gallop.   Pulmonary:     Effort: Pulmonary effort is normal. No respiratory distress.     Breath sounds: Normal breath sounds. No wheezing, rhonchi or rales.  Chest:  Breasts:     Right: No axillary adenopathy or supraclavicular adenopathy.     Left: No axillary adenopathy or supraclavicular adenopathy.    Abdominal:     General: Bowel sounds are normal. There is no distension.      Palpations: Abdomen is soft. There is no mass (no palpable abdominal passes are appreciated).     Tenderness: There is no abdominal tenderness.  Musculoskeletal:        General: No swelling.     Right lower leg: No edema.     Left lower leg: No edema.  Lymphadenopathy:     Cervical: No cervical adenopathy.     Upper Body:     Right upper body: No supraclavicular or axillary adenopathy.     Left upper body: No supraclavicular or axillary adenopathy.     Lower Body: No right inguinal adenopathy. No left inguinal adenopathy.  Skin:    General: Skin is warm.     Coloration: Skin is not jaundiced.     Findings: No lesion or rash.  Neurological:     General: No focal deficit present.     Mental Status: He is alert and oriented to person, place, and time. Mental status is at baseline.     Cranial Nerves: Cranial nerves are intact.  Psychiatric:        Mood and Affect: Mood normal.        Thought Content: Thought content normal.    LABS:     ASSESSMENT & PLAN:  Assessment/Plan:  A 80 y.o. male with metastatic FGFR2 mutation positive cholangiocarcinoma.  He will proceed with cycle 2 of cisplatin/gemcitabine early next week.  Clinically, he appears to be doing well.  I  will see him back in a 3 weeks before he heads into his 3rd cycle of chemotherapy.  The patient understands all the plans discussed today and is in agreement with them.    Mattea Seger Macarthur Critchley, MD

## 2020-11-11 ENCOUNTER — Ambulatory Visit: Payer: Medicare Other | Admitting: Oncology

## 2020-11-11 ENCOUNTER — Telehealth: Payer: Self-pay | Admitting: Oncology

## 2020-11-11 ENCOUNTER — Other Ambulatory Visit: Payer: Self-pay | Admitting: Hematology and Oncology

## 2020-11-11 ENCOUNTER — Other Ambulatory Visit: Payer: Medicare Other

## 2020-11-11 ENCOUNTER — Inpatient Hospital Stay: Payer: Medicare Other

## 2020-11-11 ENCOUNTER — Inpatient Hospital Stay (INDEPENDENT_AMBULATORY_CARE_PROVIDER_SITE_OTHER): Payer: Medicare Other | Admitting: Oncology

## 2020-11-11 ENCOUNTER — Other Ambulatory Visit: Payer: Self-pay | Admitting: Oncology

## 2020-11-11 ENCOUNTER — Other Ambulatory Visit: Payer: Self-pay

## 2020-11-11 VITALS — BP 124/81 | HR 75 | Temp 98.6°F | Resp 18 | Ht 74.0 in | Wt 297.3 lb

## 2020-11-11 DIAGNOSIS — C240 Malignant neoplasm of extrahepatic bile duct: Secondary | ICD-10-CM

## 2020-11-11 LAB — HEPATIC FUNCTION PANEL
ALT: 21 (ref 10–40)
AST: 37 (ref 14–40)
Alkaline Phosphatase: 55 (ref 25–125)
Bilirubin, Total: 0.6

## 2020-11-11 LAB — CBC AND DIFFERENTIAL
HCT: 34 — AB (ref 41–53)
Hemoglobin: 11.5 — AB (ref 13.5–17.5)
Neutrophils Absolute: 2.05
Platelets: 482 — AB (ref 150–399)
WBC: 4.1

## 2020-11-11 LAB — BASIC METABOLIC PANEL
BUN: 19 (ref 4–21)
CO2: 27 — AB (ref 13–22)
Chloride: 104 (ref 99–108)
Creatinine: 1.2 (ref 0.6–1.3)
Glucose: 136
Potassium: 4.4 (ref 3.4–5.3)
Sodium: 139 (ref 137–147)

## 2020-11-11 LAB — MAGNESIUM
MCV: 91 (ref 76–111)
Magnesium: 1.9

## 2020-11-11 LAB — COMPREHENSIVE METABOLIC PANEL
Albumin: 4.1 (ref 3.5–5.0)
Calcium: 8.5 — AB (ref 8.7–10.7)

## 2020-11-11 LAB — CBC: RBC: 3.76 — AB (ref 3.87–5.11)

## 2020-11-14 ENCOUNTER — Ambulatory Visit: Payer: Medicare Other | Admitting: Oncology

## 2020-11-14 ENCOUNTER — Other Ambulatory Visit: Payer: Medicare Other

## 2020-11-15 ENCOUNTER — Inpatient Hospital Stay: Payer: Medicare Other

## 2020-11-15 ENCOUNTER — Other Ambulatory Visit: Payer: Self-pay

## 2020-11-15 VITALS — BP 130/60 | HR 81 | Temp 97.9°F | Resp 18 | Ht 74.0 in | Wt 296.0 lb

## 2020-11-15 DIAGNOSIS — Z5111 Encounter for antineoplastic chemotherapy: Secondary | ICD-10-CM | POA: Diagnosis not present

## 2020-11-15 DIAGNOSIS — C240 Malignant neoplasm of extrahepatic bile duct: Secondary | ICD-10-CM

## 2020-11-15 MED ORDER — SODIUM CHLORIDE 0.9 % IV SOLN
10.0000 mg | Freq: Once | INTRAVENOUS | Status: AC
  Administered 2020-11-15: 10 mg via INTRAVENOUS
  Filled 2020-11-15: qty 10

## 2020-11-15 MED ORDER — PALONOSETRON HCL INJECTION 0.25 MG/5ML
INTRAVENOUS | Status: AC
  Filled 2020-11-15: qty 5

## 2020-11-15 MED ORDER — SODIUM CHLORIDE 0.9 % IV SOLN
25.0000 mg/m2 | Freq: Once | INTRAVENOUS | Status: AC
  Administered 2020-11-15: 67 mg via INTRAVENOUS
  Filled 2020-11-15: qty 67

## 2020-11-15 MED ORDER — MAGNESIUM SULFATE 2 GM/50ML IV SOLN
INTRAVENOUS | Status: AC
  Filled 2020-11-15: qty 50

## 2020-11-15 MED ORDER — PALONOSETRON HCL INJECTION 0.25 MG/5ML
0.2500 mg | Freq: Once | INTRAVENOUS | Status: AC
Start: 2020-11-15 — End: 2020-11-15
  Administered 2020-11-15: 0.25 mg via INTRAVENOUS

## 2020-11-15 MED ORDER — ALTEPLASE 2 MG IJ SOLR
2.0000 mg | Freq: Once | INTRAMUSCULAR | Status: DC | PRN
  Filled 2020-11-15: qty 2

## 2020-11-15 MED ORDER — POTASSIUM CHLORIDE IN NACL 20-0.9 MEQ/L-% IV SOLN: Freq: Once | INTRAVENOUS | Status: AC

## 2020-11-15 MED ORDER — MAGNESIUM SULFATE 2 GM/50ML IV SOLN
2.0000 g | Freq: Once | INTRAVENOUS | Status: AC
  Administered 2020-11-15: 2 g via INTRAVENOUS

## 2020-11-15 MED ORDER — SODIUM CHLORIDE 0.9% FLUSH
10.0000 mL | INTRAVENOUS | Status: DC | PRN
  Filled 2020-11-15: qty 10

## 2020-11-15 MED ORDER — POTASSIUM CHLORIDE IN NACL 20-0.9 MEQ/L-% IV SOLN
INTRAVENOUS | Status: AC
  Filled 2020-11-15: qty 1000

## 2020-11-15 MED ORDER — SODIUM CHLORIDE 0.9 % IV SOLN
1000.0000 mg/m2 | Freq: Once | INTRAVENOUS | Status: AC
  Administered 2020-11-15: 2660 mg via INTRAVENOUS
  Filled 2020-11-15: qty 52.6

## 2020-11-15 MED ORDER — SODIUM CHLORIDE 0.9 % IV SOLN
150.0000 mg | Freq: Once | INTRAVENOUS | Status: AC
  Administered 2020-11-15: 150 mg via INTRAVENOUS
  Filled 2020-11-15: qty 150

## 2020-11-15 MED ORDER — SODIUM CHLORIDE 0.9 % IV SOLN
Freq: Once | INTRAVENOUS | Status: AC
  Filled 2020-11-15: qty 250

## 2020-11-15 MED ORDER — SODIUM CHLORIDE 0.9 % IV SOLN
Freq: Once | INTRAVENOUS | Status: DC
  Filled 2020-11-15: qty 250

## 2020-11-15 NOTE — Patient Instructions (Signed)
Gemcitabine injection What is this medicine? GEMCITABINE (jem SYE ta been) is a chemotherapy drug. This medicine is used to treat many types of cancer like breast cancer, lung cancer, pancreatic cancer, and ovarian cancer. This medicine may be used for other purposes; ask your health care provider or pharmacist if you have questions. COMMON BRAND NAME(S): Gemzar, Infugem What should I tell my health care provider before I take this medicine? They need to know if you have any of these conditions:  blood disorders  infection  kidney disease  liver disease  lung or breathing disease, like asthma  recent or ongoing radiation therapy  an unusual or allergic reaction to gemcitabine, other chemotherapy, other medicines, foods, dyes, or preservatives  pregnant or trying to get pregnant  breast-feeding How should I use this medicine? This drug is given as an infusion into a vein. It is administered in a hospital or clinic by a specially trained health care professional. Talk to your pediatrician regarding the use of this medicine in children. Special care may be needed. Overdosage: If you think you have taken too much of this medicine contact a poison control center or emergency room at once. NOTE: This medicine is only for you. Do not share this medicine with others. What if I miss a dose? It is important not to miss your dose. Call your doctor or health care professional if you are unable to keep an appointment. What may interact with this medicine?  medicines to increase blood counts like filgrastim, pegfilgrastim, sargramostim  some other chemotherapy drugs like cisplatin  vaccines Talk to your doctor or health care professional before taking any of these medicines:  acetaminophen  aspirin  ibuprofen  ketoprofen  naproxen This list may not describe all possible interactions. Give your health care provider a list of all the medicines, herbs, non-prescription drugs, or  dietary supplements you use. Also tell them if you smoke, drink alcohol, or use illegal drugs. Some items may interact with your medicine. What should I watch for while using this medicine? Visit your doctor for checks on your progress. This drug may make you feel generally unwell. This is not uncommon, as chemotherapy can affect healthy cells as well as cancer cells. Report any side effects. Continue your course of treatment even though you feel ill unless your doctor tells you to stop. In some cases, you may be given additional medicines to help with side effects. Follow all directions for their use. Call your doctor or health care professional for advice if you get a fever, chills or sore throat, or other symptoms of a cold or flu. Do not treat yourself. This drug decreases your body's ability to fight infections. Try to avoid being around people who are sick. This medicine may increase your risk to bruise or bleed. Call your doctor or health care professional if you notice any unusual bleeding. Be careful brushing and flossing your teeth or using a toothpick because you may get an infection or bleed more easily. If you have any dental work done, tell your dentist you are receiving this medicine. Avoid taking products that contain aspirin, acetaminophen, ibuprofen, naproxen, or ketoprofen unless instructed by your doctor. These medicines may hide a fever. Do not become pregnant while taking this medicine or for 6 months after stopping it. Women should inform their doctor if they wish to become pregnant or think they might be pregnant. Men should not father a child while taking this medicine and for 3 months after stopping it.   There is a potential for serious side effects to an unborn child. Talk to your health care professional or pharmacist for more information. Do not breast-feed an infant while taking this medicine or for at least 1 week after stopping it. Men should inform their doctors if they wish  to father a child. This medicine may lower sperm counts. Talk with your doctor or health care professional if you are concerned about your fertility. What side effects may I notice from receiving this medicine? Side effects that you should report to your doctor or health care professional as soon as possible:  allergic reactions like skin rash, itching or hives, swelling of the face, lips, or tongue  breathing problems  pain, redness, or irritation at site where injected  signs and symptoms of a dangerous change in heartbeat or heart rhythm like chest pain; dizziness; fast or irregular heartbeat; palpitations; feeling faint or lightheaded, falls; breathing problems  signs of decreased platelets or bleeding - bruising, pinpoint red spots on the skin, black, tarry stools, blood in the urine  signs of decreased red blood cells - unusually weak or tired, feeling faint or lightheaded, falls  signs of infection - fever or chills, cough, sore throat, pain or difficulty passing urine  signs and symptoms of kidney injury like trouble passing urine or change in the amount of urine  signs and symptoms of liver injury like dark yellow or brown urine; general ill feeling or flu-like symptoms; light-colored stools; loss of appetite; nausea; right upper belly pain; unusually weak or tired; yellowing of the eyes or skin  swelling of ankles, feet, hands Side effects that usually do not require medical attention (report to your doctor or health care professional if they continue or are bothersome):  constipation  diarrhea  hair loss  loss of appetite  nausea  rash  vomiting This list may not describe all possible side effects. Call your doctor for medical advice about side effects. You may report side effects to FDA at 1-800-FDA-1088. Where should I keep my medicine? This drug is given in a hospital or clinic and will not be stored at home. NOTE: This sheet is a summary. It may not cover all  possible information. If you have questions about this medicine, talk to your doctor, pharmacist, or health care provider.  2021 Elsevier/Gold Standard (2017-12-04 18:06:11) Cisplatin injection What is this medicine? CISPLATIN (SIS pla tin) is a chemotherapy drug. It targets fast dividing cells, like cancer cells, and causes these cells to die. This medicine is used to treat many types of cancer like bladder, ovarian, and testicular cancers. This medicine may be used for other purposes; ask your health care provider or pharmacist if you have questions. COMMON BRAND NAME(S): Platinol, Platinol -AQ What should I tell my health care provider before I take this medicine? They need to know if you have any of these conditions:  eye disease, vision problems  hearing problems  kidney disease  low blood counts, like white cells, platelets, or red blood cells  tingling of the fingers or toes, or other nerve disorder  an unusual or allergic reaction to cisplatin, carboplatin, oxaliplatin, other medicines, foods, dyes, or preservatives  pregnant or trying to get pregnant  breast-feeding How should I use this medicine? This drug is given as an infusion into a vein. It is administered in a hospital or clinic by a specially trained health care professional. Talk to your pediatrician regarding the use of this medicine in children. Special care may  be needed. Overdosage: If you think you have taken too much of this medicine contact a poison control center or emergency room at once. NOTE: This medicine is only for you. Do not share this medicine with others. What if I miss a dose? It is important not to miss a dose. Call your doctor or health care professional if you are unable to keep an appointment. What may interact with this medicine? This medicine may interact with the following medications:  foscarnet  certain antibiotics like amikacin, gentamicin, neomycin, polymyxin B, streptomycin,  tobramycin, vancomycin This list may not describe all possible interactions. Give your health care provider a list of all the medicines, herbs, non-prescription drugs, or dietary supplements you use. Also tell them if you smoke, drink alcohol, or use illegal drugs. Some items may interact with your medicine. What should I watch for while using this medicine? Your condition will be monitored carefully while you are receiving this medicine. You will need important blood work done while you are taking this medicine. This drug may make you feel generally unwell. This is not uncommon, as chemotherapy can affect healthy cells as well as cancer cells. Report any side effects. Continue your course of treatment even though you feel ill unless your doctor tells you to stop. This medicine may increase your risk of getting an infection. Call your healthcare professional for advice if you get a fever, chills, or sore throat, or other symptoms of a cold or flu. Do not treat yourself. Try to avoid being around people who are sick. Avoid taking medicines that contain aspirin, acetaminophen, ibuprofen, naproxen, or ketoprofen unless instructed by your healthcare professional. These medicines may hide a fever. This medicine may increase your risk to bruise or bleed. Call your doctor or health care professional if you notice any unusual bleeding. Be careful brushing and flossing your teeth or using a toothpick because you may get an infection or bleed more easily. If you have any dental work done, tell your dentist you are receiving this medicine. Do not become pregnant while taking this medicine or for 14 months after stopping it. Women should inform their healthcare professional if they wish to become pregnant or think they might be pregnant. Men should not father a child while taking this medicine and for 11 months after stopping it. There is potential for serious side effects to an unborn child. Talk to your healthcare  professional for more information. Do not breast-feed an infant while taking this medicine. This medicine has caused ovarian failure in some women. This medicine may make it more difficult to get pregnant. Talk to your healthcare professional if you are concerned about your fertility. This medicine has caused decreased sperm counts in some men. This may make it more difficult to father a child. Talk to your healthcare professional if you are concerned about your fertility. Drink fluids as directed while you are taking this medicine. This will help protect your kidneys. Call your doctor or health care professional if you get diarrhea. Do not treat yourself. What side effects may I notice from receiving this medicine? Side effects that you should report to your doctor or health care professional as soon as possible:  allergic reactions like skin rash, itching or hives, swelling of the face, lips, or tongue  blurred vision  changes in vision  decreased hearing or ringing of the ears  nausea, vomiting  pain, redness, or irritation at site where injected  pain, tingling, numbness in the hands or feet  signs and symptoms of bleeding such as bloody or black, tarry stools; red or dark brown urine; spitting up blood or brown material that looks like coffee grounds; red spots on the skin; unusual bruising or bleeding from the eyes, gums, or nose  signs and symptoms of infection like fever; chills; cough; sore throat; pain or trouble passing urine  signs and symptoms of kidney injury like trouble passing urine or change in the amount of urine  signs and symptoms of low red blood cells or anemia such as unusually weak or tired; feeling faint or lightheaded; falls; breathing problems Side effects that usually do not require medical attention (report to your doctor or health care professional if they continue or are bothersome):  loss of appetite  mouth sores  muscle cramps This list may not  describe all possible side effects. Call your doctor for medical advice about side effects. You may report side effects to FDA at 1-800-FDA-1088. Where should I keep my medicine? This drug is given in a hospital or clinic and will not be stored at home. NOTE: This sheet is a summary. It may not cover all possible information. If you have questions about this medicine, talk to your doctor, pharmacist, or health care provider.  2021 Elsevier/Gold Standard (2018-09-05 15:59:17)

## 2020-11-21 ENCOUNTER — Telehealth: Payer: Self-pay | Admitting: Oncology

## 2020-11-21 ENCOUNTER — Other Ambulatory Visit: Payer: Self-pay | Admitting: Pharmacist

## 2020-11-21 NOTE — Telephone Encounter (Signed)
11/21/20 spoke with patient and rs appt

## 2020-11-22 ENCOUNTER — Other Ambulatory Visit: Payer: Self-pay | Admitting: Hematology and Oncology

## 2020-11-22 ENCOUNTER — Inpatient Hospital Stay: Payer: Medicare Other | Attending: Oncology

## 2020-11-22 ENCOUNTER — Inpatient Hospital Stay: Payer: Medicare Other

## 2020-11-22 ENCOUNTER — Other Ambulatory Visit: Payer: Self-pay

## 2020-11-22 DIAGNOSIS — C220 Liver cell carcinoma: Secondary | ICD-10-CM | POA: Insufficient documentation

## 2020-11-22 DIAGNOSIS — I2510 Atherosclerotic heart disease of native coronary artery without angina pectoris: Secondary | ICD-10-CM | POA: Insufficient documentation

## 2020-11-22 DIAGNOSIS — Z5111 Encounter for antineoplastic chemotherapy: Secondary | ICD-10-CM | POA: Insufficient documentation

## 2020-11-22 LAB — BASIC METABOLIC PANEL
BUN: 23 — AB (ref 4–21)
CO2: 27 — AB (ref 13–22)
Chloride: 104 (ref 99–108)
Creatinine: 1 (ref 0.6–1.3)
Glucose: 164
Potassium: 4.5 (ref 3.4–5.3)
Sodium: 139 (ref 137–147)

## 2020-11-22 LAB — CBC AND DIFFERENTIAL
HCT: 34 — AB (ref 41–53)
Hemoglobin: 11.4 — AB (ref 13.5–17.5)
Neutrophils Absolute: 3.6
Platelets: 195 (ref 150–399)
WBC: 5.3

## 2020-11-22 LAB — HEPATIC FUNCTION PANEL
ALT: 26 (ref 10–40)
AST: 28 (ref 14–40)
Alkaline Phosphatase: 54 (ref 25–125)
Bilirubin, Total: 0.6

## 2020-11-22 LAB — CBC
MCV: 91 (ref 80–94)
RBC: 3.76 — AB (ref 3.87–5.11)

## 2020-11-22 LAB — COMPREHENSIVE METABOLIC PANEL
Albumin: 4.2 (ref 3.5–5.0)
Calcium: 8.9 (ref 8.7–10.7)

## 2020-11-23 ENCOUNTER — Inpatient Hospital Stay: Payer: Medicare Other | Attending: Oncology

## 2020-11-23 VITALS — BP 132/76 | HR 73 | Temp 98.4°F | Resp 16 | Ht 74.0 in | Wt 295.8 lb

## 2020-11-23 DIAGNOSIS — Z5111 Encounter for antineoplastic chemotherapy: Secondary | ICD-10-CM | POA: Diagnosis present

## 2020-11-23 DIAGNOSIS — C221 Intrahepatic bile duct carcinoma: Secondary | ICD-10-CM | POA: Diagnosis not present

## 2020-11-23 DIAGNOSIS — C240 Malignant neoplasm of extrahepatic bile duct: Secondary | ICD-10-CM

## 2020-11-23 MED ORDER — PALONOSETRON HCL INJECTION 0.25 MG/5ML
INTRAVENOUS | Status: AC
  Filled 2020-11-23: qty 5

## 2020-11-23 MED ORDER — SODIUM CHLORIDE 0.9 % IV SOLN
Freq: Once | INTRAVENOUS | Status: AC
  Filled 2020-11-23: qty 250

## 2020-11-23 MED ORDER — MAGNESIUM SULFATE 2 GM/50ML IV SOLN
2.0000 g | Freq: Once | INTRAVENOUS | Status: AC
  Administered 2020-11-23: 2 g via INTRAVENOUS

## 2020-11-23 MED ORDER — POTASSIUM CHLORIDE IN NACL 20-0.9 MEQ/L-% IV SOLN: Freq: Once | INTRAVENOUS | Status: AC

## 2020-11-23 MED ORDER — PALONOSETRON HCL INJECTION 0.25 MG/5ML
0.2500 mg | Freq: Once | INTRAVENOUS | Status: AC
  Administered 2020-11-23: 0.25 mg via INTRAVENOUS

## 2020-11-23 MED ORDER — CISPLATIN CHEMO INJECTION 100MG/100ML
25.0000 mg/m2 | Freq: Once | INTRAVENOUS | Status: AC
  Administered 2020-11-23: 67 mg via INTRAVENOUS
  Filled 2020-11-23: qty 50

## 2020-11-23 MED ORDER — SODIUM CHLORIDE 0.9 % IV SOLN
1000.0000 mg/m2 | Freq: Once | INTRAVENOUS | Status: AC
  Administered 2020-11-23: 2660 mg via INTRAVENOUS
  Filled 2020-11-23: qty 52.6

## 2020-11-23 MED ORDER — SODIUM CHLORIDE 0.9 % IV SOLN
10.0000 mg | Freq: Once | INTRAVENOUS | Status: AC
  Administered 2020-11-23: 10 mg via INTRAVENOUS
  Filled 2020-11-23: qty 1
  Filled 2020-11-23: qty 10

## 2020-11-23 MED ORDER — SODIUM CHLORIDE 0.9% FLUSH
10.0000 mL | INTRAVENOUS | Status: DC | PRN
  Administered 2020-11-23: 10 mL
  Filled 2020-11-23: qty 10

## 2020-11-23 MED ORDER — SODIUM CHLORIDE 0.9 % IV SOLN
150.0000 mg | Freq: Once | INTRAVENOUS | Status: AC
  Administered 2020-11-23: 150 mg via INTRAVENOUS
  Filled 2020-11-23: qty 5
  Filled 2020-11-23: qty 150

## 2020-11-23 NOTE — Patient Instructions (Signed)
[facility] Discharge Instructions for Patients Receiving Chemotherapy  Today you received the following chemotherapy agents Cisplatin, Gemcitabine.  To help prevent nausea and vomiting after your treatment, we encourage you to take your nausea medication.  Cisplatin injection What is this medicine? CISPLATIN (SIS pla tin) is a chemotherapy drug. It targets fast dividing cells, like cancer cells, and causes these cells to die. This medicine is used to treat many types of cancer like bladder, ovarian, and testicular cancers. This medicine may be used for other purposes; ask your health care provider or pharmacist if you have questions. COMMON BRAND NAME(S): Platinol, Platinol -AQ What should I tell my health care provider before I take this medicine? They need to know if you have any of these conditions:  eye disease, vision problems  hearing problems  kidney disease  low blood counts, like white cells, platelets, or red blood cells  tingling of the fingers or toes, or other nerve disorder  an unusual or allergic reaction to cisplatin, carboplatin, oxaliplatin, other medicines, foods, dyes, or preservatives  pregnant or trying to get pregnant  breast-feeding How should I use this medicine? This drug is given as an infusion into a vein. It is administered in a hospital or clinic by a specially trained health care professional. Talk to your pediatrician regarding the use of this medicine in children. Special care may be needed. Overdosage: If you think you have taken too much of this medicine contact a poison control center or emergency room at once. NOTE: This medicine is only for you. Do not share this medicine with others. What if I miss a dose? It is important not to miss a dose. Call your doctor or health care professional if you are unable to keep an appointment. What may interact with this medicine? This medicine may interact with the following  medications:  foscarnet  certain antibiotics like amikacin, gentamicin, neomycin, polymyxin B, streptomycin, tobramycin, vancomycin This list may not describe all possible interactions. Give your health care provider a list of all the medicines, herbs, non-prescription drugs, or dietary supplements you use. Also tell them if you smoke, drink alcohol, or use illegal drugs. Some items may interact with your medicine. What should I watch for while using this medicine? Your condition will be monitored carefully while you are receiving this medicine. You will need important blood work done while you are taking this medicine. This drug may make you feel generally unwell. This is not uncommon, as chemotherapy can affect healthy cells as well as cancer cells. Report any side effects. Continue your course of treatment even though you feel ill unless your doctor tells you to stop. This medicine may increase your risk of getting an infection. Call your healthcare professional for advice if you get a fever, chills, or sore throat, or other symptoms of a cold or flu. Do not treat yourself. Try to avoid being around people who are sick. Avoid taking medicines that contain aspirin, acetaminophen, ibuprofen, naproxen, or ketoprofen unless instructed by your healthcare professional. These medicines may hide a fever. This medicine may increase your risk to bruise or bleed. Call your doctor or health care professional if you notice any unusual bleeding. Be careful brushing and flossing your teeth or using a toothpick because you may get an infection or bleed more easily. If you have any dental work done, tell your dentist you are receiving this medicine. Do not become pregnant while taking this medicine or for 14 months after  stopping it. Women should inform their healthcare professional if they wish to become pregnant or think they might be pregnant. Men should not father a child while taking this medicine and for 11  months after stopping it. There is potential for serious side effects to an unborn child. Talk to your healthcare professional for more information. Do not breast-feed an infant while taking this medicine. This medicine has caused ovarian failure in some women. This medicine may make it more difficult to get pregnant. Talk to your healthcare professional if you are concerned about your fertility. This medicine has caused decreased sperm counts in some men. This may make it more difficult to father a child. Talk to your healthcare professional if you are concerned about your fertility. Drink fluids as directed while you are taking this medicine. This will help protect your kidneys. Call your doctor or health care professional if you get diarrhea. Do not treat yourself. What side effects may I notice from receiving this medicine? Side effects that you should report to your doctor or health care professional as soon as possible:  allergic reactions like skin rash, itching or hives, swelling of the face, lips, or tongue  blurred vision  changes in vision  decreased hearing or ringing of the ears  nausea, vomiting  pain, redness, or irritation at site where injected  pain, tingling, numbness in the hands or feet  signs and symptoms of bleeding such as bloody or black, tarry stools; red or dark brown urine; spitting up blood or brown material that looks like coffee grounds; red spots on the skin; unusual bruising or bleeding from the eyes, gums, or nose  signs and symptoms of infection like fever; chills; cough; sore throat; pain or trouble passing urine  signs and symptoms of kidney injury like trouble passing urine or change in the amount of urine  signs and symptoms of low red blood cells or anemia such as unusually weak or tired; feeling faint or lightheaded; falls; breathing problems Side effects that usually do not require medical attention (report to your doctor or health care  professional if they continue or are bothersome):  loss of appetite  mouth sores  muscle cramps This list may not describe all possible side effects. Call your doctor for medical advice about side effects. You may report side effects to FDA at 1-800-FDA-1088. Where should I keep my medicine? This drug is given in a hospital or clinic and will not be stored at home. NOTE: This sheet is a summary. It may not cover all possible information. If you have questions about this medicine, talk to your doctor, pharmacist, or health care provider.  2021 Elsevier/Gold Standard (2018-09-05 15:59:17)      Gemcitabine injection What is this medicine? GEMCITABINE (jem SYE ta been) is a chemotherapy drug. This medicine is used to treat many types of cancer like breast cancer, lung cancer, pancreatic cancer, and ovarian cancer. This medicine may be used for other purposes; ask your health care provider or pharmacist if you have questions. COMMON BRAND NAME(S): Gemzar, Infugem What should I tell my health care provider before I take this medicine? They need to know if you have any of these conditions:  blood disorders  infection  kidney disease  liver disease  lung or breathing disease, like asthma  recent or ongoing radiation therapy  an unusual or allergic reaction to gemcitabine, other chemotherapy, other medicines, foods, dyes, or preservatives  pregnant or trying to get pregnant  breast-feeding How should I  use this medicine? This drug is given as an infusion into a vein. It is administered in a hospital or clinic by a specially trained health care professional. Talk to your pediatrician regarding the use of this medicine in children. Special care may be needed. Overdosage: If you think you have taken too much of this medicine contact a poison control center or emergency room at once. NOTE: This medicine is only for you. Do not share this medicine with others. What if I miss a  dose? It is important not to miss your dose. Call your doctor or health care professional if you are unable to keep an appointment. What may interact with this medicine?  medicines to increase blood counts like filgrastim, pegfilgrastim, sargramostim  some other chemotherapy drugs like cisplatin  vaccines Talk to your doctor or health care professional before taking any of these medicines:  acetaminophen  aspirin  ibuprofen  ketoprofen  naproxen This list may not describe all possible interactions. Give your health care provider a list of all the medicines, herbs, non-prescription drugs, or dietary supplements you use. Also tell them if you smoke, drink alcohol, or use illegal drugs. Some items may interact with your medicine. What should I watch for while using this medicine? Visit your doctor for checks on your progress. This drug may make you feel generally unwell. This is not uncommon, as chemotherapy can affect healthy cells as well as cancer cells. Report any side effects. Continue your course of treatment even though you feel ill unless your doctor tells you to stop. In some cases, you may be given additional medicines to help with side effects. Follow all directions for their use. Call your doctor or health care professional for advice if you get a fever, chills or sore throat, or other symptoms of a cold or flu. Do not treat yourself. This drug decreases your body's ability to fight infections. Try to avoid being around people who are sick. This medicine may increase your risk to bruise or bleed. Call your doctor or health care professional if you notice any unusual bleeding. Be careful brushing and flossing your teeth or using a toothpick because you may get an infection or bleed more easily. If you have any dental work done, tell your dentist you are receiving this medicine. Avoid taking products that contain aspirin, acetaminophen, ibuprofen, naproxen, or ketoprofen unless  instructed by your doctor. These medicines may hide a fever. Do not become pregnant while taking this medicine or for 6 months after stopping it. Women should inform their doctor if they wish to become pregnant or think they might be pregnant. Men should not father a child while taking this medicine and for 3 months after stopping it. There is a potential for serious side effects to an unborn child. Talk to your health care professional or pharmacist for more information. Do not breast-feed an infant while taking this medicine or for at least 1 week after stopping it. Men should inform their doctors if they wish to father a child. This medicine may lower sperm counts. Talk with your doctor or health care professional if you are concerned about your fertility. What side effects may I notice from receiving this medicine? Side effects that you should report to your doctor or health care professional as soon as possible:  allergic reactions like skin rash, itching or hives, swelling of the face, lips, or tongue  breathing problems  pain, redness, or irritation at site where injected  signs and symptoms of  a dangerous change in heartbeat or heart rhythm like chest pain; dizziness; fast or irregular heartbeat; palpitations; feeling faint or lightheaded, falls; breathing problems  signs of decreased platelets or bleeding - bruising, pinpoint red spots on the skin, black, tarry stools, blood in the urine  signs of decreased red blood cells - unusually weak or tired, feeling faint or lightheaded, falls  signs of infection - fever or chills, cough, sore throat, pain or difficulty passing urine  signs and symptoms of kidney injury like trouble passing urine or change in the amount of urine  signs and symptoms of liver injury like dark yellow or brown urine; general ill feeling or flu-like symptoms; light-colored stools; loss of appetite; nausea; right upper belly pain; unusually weak or tired; yellowing  of the eyes or skin  swelling of ankles, feet, hands Side effects that usually do not require medical attention (report to your doctor or health care professional if they continue or are bothersome):  constipation  diarrhea  hair loss  loss of appetite  nausea  rash  vomiting This list may not describe all possible side effects. Call your doctor for medical advice about side effects. You may report side effects to FDA at 1-800-FDA-1088. Where should I keep my medicine? This drug is given in a hospital or clinic and will not be stored at home. NOTE: This sheet is a summary. It may not cover all possible information. If you have questions about this medicine, talk to your doctor, pharmacist, or health care provider.  2021 Elsevier/Gold Standard (2017-12-04 18:06:11)    If you develop nausea and vomiting that is not controlled by your nausea medication, call the clinic.   BELOW ARE SYMPTOMS THAT SHOULD BE REPORTED IMMEDIATELY:  *FEVER GREATER THAN 100.5 F  *CHILLS WITH OR WITHOUT FEVER  NAUSEA AND VOMITING THAT IS NOT CONTROLLED WITH YOUR NAUSEA MEDICATION  *UNUSUAL SHORTNESS OF BREATH  *UNUSUAL BRUISING OR BLEEDING  TENDERNESS IN MOUTH AND THROAT WITH OR WITHOUT PRESENCE OF ULCERS  *URINARY PROBLEMS  *BOWEL PROBLEMS  UNUSUAL RASH Items with * indicate a potential emergency and should be followed up as soon as possible.  Feel free to call the clinic should you have any questions or concerns at The clinic phone number is [phone].  Please show the [facility] at check-in to the Emergency Department and triage nurse.

## 2020-11-23 NOTE — Progress Notes (Signed)
1443-Pt c/o dizziness-Cisplatin stopped, VS stable-Susan Phy, Pharm D in to check pt. Pt confirms that over the past several days he has had intermittent episodes of dizziness.  Pt also states that did not take any home meds this a.m.Marland Kitchen  After a few minutes have passed pt voices that dizziness has resided.  Cisplatin resumed.                                   1528-Pt's treatment for today completed-denies dizziness-RN walked to vehicle with pt, gait steady, no more c/o dizziness. Pt informed to call prn.

## 2020-12-02 ENCOUNTER — Other Ambulatory Visit: Payer: Self-pay | Admitting: Cardiology

## 2020-12-02 ENCOUNTER — Inpatient Hospital Stay: Payer: Medicare Other

## 2020-12-02 ENCOUNTER — Inpatient Hospital Stay (INDEPENDENT_AMBULATORY_CARE_PROVIDER_SITE_OTHER): Payer: Medicare Other | Admitting: Oncology

## 2020-12-02 ENCOUNTER — Other Ambulatory Visit: Payer: Self-pay | Admitting: Hematology and Oncology

## 2020-12-02 ENCOUNTER — Other Ambulatory Visit: Payer: Self-pay

## 2020-12-02 VITALS — BP 139/59 | HR 86 | Temp 97.5°F | Resp 16 | Ht 74.0 in | Wt 292.7 lb

## 2020-12-02 DIAGNOSIS — T451X5A Adverse effect of antineoplastic and immunosuppressive drugs, initial encounter: Secondary | ICD-10-CM

## 2020-12-02 DIAGNOSIS — R0609 Other forms of dyspnea: Secondary | ICD-10-CM | POA: Diagnosis not present

## 2020-12-02 DIAGNOSIS — C240 Malignant neoplasm of extrahepatic bile duct: Secondary | ICD-10-CM

## 2020-12-02 DIAGNOSIS — R110 Nausea: Secondary | ICD-10-CM

## 2020-12-02 LAB — BASIC METABOLIC PANEL
BUN: 28 — AB (ref 4–21)
CO2: 28 — AB (ref 13–22)
Chloride: 102 (ref 99–108)
Creatinine: 1.6 — AB (ref 0.6–1.3)
Glucose: 126
Potassium: 4.6 (ref 3.4–5.3)
Sodium: 136 — AB (ref 137–147)

## 2020-12-02 LAB — CBC AND DIFFERENTIAL
HCT: 30 — AB (ref 41–53)
Hemoglobin: 10 — AB (ref 13.5–17.5)
Neutrophils Absolute: 4.15
Platelets: 72 — AB (ref 150–399)
WBC: 6.8

## 2020-12-02 LAB — COMPREHENSIVE METABOLIC PANEL
Albumin: 4.1 (ref 3.5–5.0)
Calcium: 8.5 — AB (ref 8.7–10.7)

## 2020-12-02 LAB — CBC
MCV: 91 (ref 80–94)
RBC: 3.33 — AB (ref 3.87–5.11)

## 2020-12-02 LAB — HEPATIC FUNCTION PANEL
ALT: 19 (ref 10–40)
AST: 27 (ref 14–40)
Alkaline Phosphatase: 58 (ref 25–125)
Bilirubin, Total: 0.5

## 2020-12-02 LAB — MAGNESIUM: Magnesium: 2.4 — AB (ref 1.6–2.3)

## 2020-12-02 NOTE — Progress Notes (Signed)
[facility]  [address] [place],  [place]  [address] [phone]  Clinic Day:  12/02/2020  Referring physician: Bonnita Nasuti, MD   HISTORY OF PRESENT ILLNESS:  The patient is a 80 y.o. 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  He comes in today to be evaluated before heading into his 3rd cycle of cisplatin/gemcitabine, which he receives weekly, every 2/3 weeks.  The patient claims to have tolerated his 2nd cycle fairly well.  However, he reports being more short of breath when he walks.  He notices dyspnea after walking 50 feet. He also has intermittent, non-radiaiting chest pain.  Of note, this gentleman has known heart disease which has had to be treated within the recent months.    PHYSICAL EXAM:  Blood pressure (!) 139/59, pulse 86, temperature (!) 97.5 F (36.4 C), resp. rate 16, height _0  (1.88 m), weight 292 lb 11.2 oz (132.8 kg), SpO2 93 %. Wt Readings from Last 3 Encounters:  12/02/20 292 lb 11.2 oz (132.8 kg)  11/23/20 295 lb 12 oz (134.2 kg)  11/15/20 296 lb (134.3 kg)   Body mass index is 37.58 kg/m. Performance status (ECOG): 1 - Symptomatic but completely ambulatory Physical Exam Constitutional:      Appearance: Normal appearance. He is not ill-appearing.  HENT:     Mouth/Throat:     Mouth: Mucous membranes are moist.     Pharynx: Oropharynx is clear. No oropharyngeal exudate or posterior oropharyngeal erythema.  Cardiovascular:     Rate and Rhythm: Normal rate and regular rhythm.     Heart sounds: No murmur heard. No friction rub. No gallop.   Pulmonary:     Effort: Pulmonary effort is normal. No respiratory distress.     Breath sounds: Normal breath sounds. No wheezing, rhonchi or rales.  Chest:  Breasts:     Right: No axillary adenopathy or supraclavicular adenopathy.     Left: No axillary  adenopathy or supraclavicular adenopathy.    Abdominal:     General: Bowel sounds are normal. There is no distension.     Palpations: Abdomen is soft. There is no mass (no palpable abdominal passes are appreciated).     Tenderness: There is no abdominal tenderness.  Musculoskeletal:        General: No swelling.     Right lower leg: No edema.     Left lower leg: No edema.  Lymphadenopathy:     Cervical: No cervical adenopathy.     Upper Body:     Right upper body: No supraclavicular or axillary adenopathy.     Left upper body: No supraclavicular or axillary adenopathy.     Lower Body: No right inguinal adenopathy. No left inguinal adenopathy.  Skin:    General: Skin is warm.     Coloration: Skin is not jaundiced.     Findings: No lesion or rash.  Neurological:     General: No focal deficit present.     Mental Status: He is alert and oriented to person, place, and time. Mental status is at baseline.     Cranial Nerves: Cranial nerves are intact.  Psychiatric:        Mood and Affect: Mood normal.        Thought Content: Thought content normal.    LABS:    Ref. Range 12/02/2020 00:00  Sodium Latest Ref Range: 137 - 147  136 (A)  Potassium Latest Ref Range: 3.4 - 5.3  4.6  Chloride Latest Ref Range: 99 - 108  102  CO2 Latest Ref Range: 13 - 22  28 (A)  Glucose Unknown 126  BUN Latest Ref Range: 4 - 21  28 (A)  Creatinine Latest Ref Range: 0.6 - 1.3  1.6 (A)  Calcium Latest Ref Range: 8.7 - 10.7  8.5 (A)  Magnesium Latest Ref Range: 1.6 - 2.3  2.4 (A)  Alkaline Phosphatase Latest Ref Range: 25 - 125  58  Albumin Latest Ref Range: 3.5 - 5.0  4.1  AST Latest Ref Range: 14 - 40  27  ALT Latest Ref Range: 10 - 40  19  Bilirubin, Total Unknown 0.5  WBC Unknown 6.8  RBC Latest Ref Range: 3.87 - 5.11  3.33 (A)  Hemoglobin Latest Ref Range: 13.5 - 17.5  10.0 (A)  HCT Latest Ref Range: 41 - 53  30 (A)  MCV Latest Ref Range: 80 - 94  91  Platelets Latest Ref Range: 150 - 399  72  (A)  NEUT# Unknown 4.15     ASSESSMENT & PLAN:  Assessment/Plan:  A 80 y.o. male with metastatic FGFR2 mutation positive cholangiocarcinoma.  In clinic today, I was very concerned with the symptoms he was complaining of, all of which were highly concerning for persistence of coronary artery disease.  I discussed his case with cardiology, who also felt his symptoms were worrisome for underlying heart disease being present.  Although he is anemic, I still would not expect this degree of shortness of breath with a hemoglobin of 10.  He will be taken to the emergency room so a cardiac workup can be performed.  I will see him back in 1-2 weeks to reassess him before he heads into his 3rd cycle of cisplatin/gemcitabine.  The patient understands all the plans discussed today and is in agreement with them.    Dequincy Macarthur Critchley, MD

## 2020-12-02 NOTE — Telephone Encounter (Signed)
Refill for Carvedilol 6.25 mg to pharmacy

## 2020-12-03 DIAGNOSIS — E785 Hyperlipidemia, unspecified: Secondary | ICD-10-CM

## 2020-12-03 DIAGNOSIS — I429 Cardiomyopathy, unspecified: Secondary | ICD-10-CM

## 2020-12-03 DIAGNOSIS — I10 Essential (primary) hypertension: Secondary | ICD-10-CM

## 2020-12-03 DIAGNOSIS — I2510 Atherosclerotic heart disease of native coronary artery without angina pectoris: Secondary | ICD-10-CM

## 2020-12-03 DIAGNOSIS — R0609 Other forms of dyspnea: Secondary | ICD-10-CM

## 2020-12-03 DIAGNOSIS — C221 Intrahepatic bile duct carcinoma: Secondary | ICD-10-CM

## 2020-12-04 DIAGNOSIS — I10 Essential (primary) hypertension: Secondary | ICD-10-CM | POA: Diagnosis not present

## 2020-12-04 DIAGNOSIS — E785 Hyperlipidemia, unspecified: Secondary | ICD-10-CM | POA: Diagnosis not present

## 2020-12-04 DIAGNOSIS — R0609 Other forms of dyspnea: Secondary | ICD-10-CM | POA: Diagnosis not present

## 2020-12-04 DIAGNOSIS — I2510 Atherosclerotic heart disease of native coronary artery without angina pectoris: Secondary | ICD-10-CM | POA: Diagnosis not present

## 2020-12-05 ENCOUNTER — Telehealth: Payer: Self-pay | Admitting: Oncology

## 2020-12-05 NOTE — Telephone Encounter (Signed)
Patient called regarding his Infusion Appt for 3/15 - He was just discharged from hospital.  Per Dr Bobby Rumpf, pushed his Appt's out one week.  Left message for patient to return my call

## 2020-12-06 ENCOUNTER — Inpatient Hospital Stay: Payer: Medicare Other

## 2020-12-12 ENCOUNTER — Inpatient Hospital Stay: Payer: Medicare Other

## 2020-12-12 ENCOUNTER — Telehealth: Payer: Self-pay | Admitting: Oncology

## 2020-12-12 ENCOUNTER — Other Ambulatory Visit: Payer: Self-pay | Admitting: Hematology and Oncology

## 2020-12-12 DIAGNOSIS — C240 Malignant neoplasm of extrahepatic bile duct: Secondary | ICD-10-CM

## 2020-12-12 LAB — COMPREHENSIVE METABOLIC PANEL
Albumin: 3.9 (ref 3.5–5.0)
Calcium: 8.2 — AB (ref 8.7–10.7)

## 2020-12-12 LAB — CBC AND DIFFERENTIAL
HCT: 30 — AB (ref 41–53)
Hemoglobin: 10 — AB (ref 13.5–17.5)
Neutrophils Absolute: 5.27
Platelets: 207 (ref 150–399)
WBC: 8.1

## 2020-12-12 LAB — CBC
MCV: 93 (ref 80–94)
RBC: 3.23 — AB (ref 3.87–5.11)

## 2020-12-12 LAB — HEPATIC FUNCTION PANEL
ALT: 15 (ref 10–40)
AST: 27 (ref 14–40)
Alkaline Phosphatase: 48 (ref 25–125)
Bilirubin, Total: 0.5

## 2020-12-12 LAB — BASIC METABOLIC PANEL
BUN: 18 (ref 4–21)
CO2: 27 — AB (ref 13–22)
Chloride: 106 (ref 99–108)
Creatinine: 1.3 (ref 0.6–1.3)
Glucose: 125
Potassium: 4.5 (ref 3.4–5.3)
Sodium: 138 (ref 137–147)

## 2020-12-12 LAB — MAGNESIUM: Magnesium: 2.1 (ref 1.6–2.3)

## 2020-12-12 NOTE — Telephone Encounter (Signed)
Per 3/21 Staff Msg, patient scheduled for April Appt's.  Requested Appt Summary be given at 3/22 Appt

## 2020-12-13 ENCOUNTER — Other Ambulatory Visit: Payer: Self-pay

## 2020-12-13 ENCOUNTER — Other Ambulatory Visit: Payer: Self-pay | Admitting: Hematology and Oncology

## 2020-12-13 ENCOUNTER — Inpatient Hospital Stay: Payer: Medicare Other

## 2020-12-13 VITALS — BP 135/70 | HR 69 | Temp 98.5°F | Resp 18 | Ht 74.0 in | Wt 291.8 lb

## 2020-12-13 DIAGNOSIS — J309 Allergic rhinitis, unspecified: Secondary | ICD-10-CM

## 2020-12-13 DIAGNOSIS — C240 Malignant neoplasm of extrahepatic bile duct: Secondary | ICD-10-CM

## 2020-12-13 DIAGNOSIS — E782 Mixed hyperlipidemia: Secondary | ICD-10-CM

## 2020-12-13 DIAGNOSIS — D518 Other vitamin B12 deficiency anemias: Secondary | ICD-10-CM

## 2020-12-13 DIAGNOSIS — E559 Vitamin D deficiency, unspecified: Secondary | ICD-10-CM

## 2020-12-13 DIAGNOSIS — Z79899 Other long term (current) drug therapy: Secondary | ICD-10-CM

## 2020-12-13 DIAGNOSIS — F411 Generalized anxiety disorder: Secondary | ICD-10-CM | POA: Insufficient documentation

## 2020-12-13 DIAGNOSIS — G894 Chronic pain syndrome: Secondary | ICD-10-CM | POA: Insufficient documentation

## 2020-12-13 DIAGNOSIS — C220 Liver cell carcinoma: Secondary | ICD-10-CM | POA: Diagnosis not present

## 2020-12-13 DIAGNOSIS — J449 Chronic obstructive pulmonary disease, unspecified: Secondary | ICD-10-CM

## 2020-12-13 DIAGNOSIS — I5022 Chronic systolic (congestive) heart failure: Secondary | ICD-10-CM

## 2020-12-13 DIAGNOSIS — F320 Major depressive disorder, single episode, mild: Secondary | ICD-10-CM

## 2020-12-13 DIAGNOSIS — K219 Gastro-esophageal reflux disease without esophagitis: Secondary | ICD-10-CM

## 2020-12-13 DIAGNOSIS — E119 Type 2 diabetes mellitus without complications: Secondary | ICD-10-CM | POA: Insufficient documentation

## 2020-12-13 DIAGNOSIS — E1165 Type 2 diabetes mellitus with hyperglycemia: Secondary | ICD-10-CM

## 2020-12-13 DIAGNOSIS — E039 Hypothyroidism, unspecified: Secondary | ICD-10-CM

## 2020-12-13 DIAGNOSIS — L97909 Non-pressure chronic ulcer of unspecified part of unspecified lower leg with unspecified severity: Secondary | ICD-10-CM | POA: Insufficient documentation

## 2020-12-13 DIAGNOSIS — Z5111 Encounter for antineoplastic chemotherapy: Secondary | ICD-10-CM | POA: Diagnosis present

## 2020-12-13 DIAGNOSIS — M25552 Pain in left hip: Secondary | ICD-10-CM | POA: Insufficient documentation

## 2020-12-13 DIAGNOSIS — I2510 Atherosclerotic heart disease of native coronary artery without angina pectoris: Secondary | ICD-10-CM | POA: Diagnosis not present

## 2020-12-13 DIAGNOSIS — N401 Enlarged prostate with lower urinary tract symptoms: Secondary | ICD-10-CM | POA: Insufficient documentation

## 2020-12-13 DIAGNOSIS — J4520 Mild intermittent asthma, uncomplicated: Secondary | ICD-10-CM

## 2020-12-13 HISTORY — DX: Mild intermittent asthma, uncomplicated: J45.20

## 2020-12-13 HISTORY — DX: Chronic obstructive pulmonary disease, unspecified: J44.9

## 2020-12-13 HISTORY — DX: Other vitamin B12 deficiency anemias: D51.8

## 2020-12-13 HISTORY — DX: Hypothyroidism, unspecified: E03.9

## 2020-12-13 HISTORY — DX: Vitamin D deficiency, unspecified: E55.9

## 2020-12-13 HISTORY — DX: Type 2 diabetes mellitus with hyperglycemia: E11.65

## 2020-12-13 HISTORY — DX: Other long term (current) drug therapy: Z79.899

## 2020-12-13 HISTORY — DX: Mixed hyperlipidemia: E78.2

## 2020-12-13 HISTORY — DX: Type 2 diabetes mellitus without complications: E11.9

## 2020-12-13 HISTORY — DX: Pain in left hip: M25.552

## 2020-12-13 HISTORY — DX: Benign prostatic hyperplasia with lower urinary tract symptoms: N40.1

## 2020-12-13 HISTORY — DX: Major depressive disorder, single episode, mild: F32.0

## 2020-12-13 HISTORY — DX: Chronic systolic (congestive) heart failure: I50.22

## 2020-12-13 HISTORY — DX: Chronic pain syndrome: G89.4

## 2020-12-13 HISTORY — DX: Allergic rhinitis, unspecified: J30.9

## 2020-12-13 HISTORY — DX: Generalized anxiety disorder: F41.1

## 2020-12-13 HISTORY — DX: Gastro-esophageal reflux disease without esophagitis: K21.9

## 2020-12-13 MED ORDER — SODIUM CHLORIDE 0.9 % IV SOLN
1000.0000 mg/m2 | Freq: Once | INTRAVENOUS | Status: AC
  Administered 2020-12-13: 2660 mg via INTRAVENOUS
  Filled 2020-12-13: qty 52.6

## 2020-12-13 MED ORDER — PALONOSETRON HCL INJECTION 0.25 MG/5ML
INTRAVENOUS | Status: AC
  Filled 2020-12-13: qty 5

## 2020-12-13 MED ORDER — SODIUM CHLORIDE 0.9 % IV SOLN
150.0000 mg | Freq: Once | INTRAVENOUS | Status: AC
  Administered 2020-12-13: 150 mg via INTRAVENOUS
  Filled 2020-12-13: qty 150

## 2020-12-13 MED ORDER — SODIUM CHLORIDE 0.9 % IV SOLN
10.0000 mg | Freq: Once | INTRAVENOUS | Status: AC
  Administered 2020-12-13: 10 mg via INTRAVENOUS
  Filled 2020-12-13: qty 10

## 2020-12-13 MED ORDER — SODIUM CHLORIDE 0.9 % IV SOLN
Freq: Once | INTRAVENOUS | Status: AC
  Filled 2020-12-13: qty 250

## 2020-12-13 MED ORDER — FUROSEMIDE 10 MG/ML IJ SOLN
INTRAMUSCULAR | Status: AC
  Filled 2020-12-13: qty 2

## 2020-12-13 MED ORDER — POTASSIUM CHLORIDE IN NACL 20-0.9 MEQ/L-% IV SOLN
INTRAVENOUS | Status: AC
  Filled 2020-12-13: qty 1000

## 2020-12-13 MED ORDER — FUROSEMIDE 10 MG/ML IJ SOLN
20.0000 mg | Freq: Once | INTRAMUSCULAR | Status: AC
  Administered 2020-12-13: 20 mg via INTRAVENOUS

## 2020-12-13 MED ORDER — PALONOSETRON HCL INJECTION 0.25 MG/5ML
0.2500 mg | Freq: Once | INTRAVENOUS | Status: AC
Start: 2020-12-13 — End: 2020-12-13
  Administered 2020-12-13: 0.25 mg via INTRAVENOUS

## 2020-12-13 MED ORDER — SODIUM CHLORIDE 0.9 % IV SOLN
25.0000 mg/m2 | Freq: Once | INTRAVENOUS | Status: AC
  Administered 2020-12-13: 67 mg via INTRAVENOUS
  Filled 2020-12-13: qty 67

## 2020-12-13 MED ORDER — POTASSIUM CHLORIDE IN NACL 20-0.9 MEQ/L-% IV SOLN: Freq: Once | INTRAVENOUS | Status: AC

## 2020-12-13 NOTE — Progress Notes (Signed)
1410:PT STABLE AT TIME OF DISCHARGE ?

## 2020-12-13 NOTE — Patient Instructions (Signed)
[facility] Discharge Instructions for Patients Receiving Chemotherapy  Today you received the following chemotherapy agents Cisplatin,Gemcitabine  To help prevent nausea and vomiting after your treatment, we encourage you to take your nausea medication as directed.   If you develop nausea and vomiting that is not controlled by your nausea medication, call the clinic.   BELOW ARE SYMPTOMS THAT SHOULD BE REPORTED IMMEDIATELY:  *FEVER GREATER THAN 100.5 F  *CHILLS WITH OR WITHOUT FEVER  NAUSEA AND VOMITING THAT IS NOT CONTROLLED WITH YOUR NAUSEA MEDICATION  *UNUSUAL SHORTNESS OF BREATH  *UNUSUAL BRUISING OR BLEEDING  TENDERNESS IN MOUTH AND THROAT WITH OR WITHOUT PRESENCE OF ULCERS  *URINARY PROBLEMS  *BOWEL PROBLEMS  UNUSUAL RASH Items with * indicate a potential emergency and should be followed up as soon as possible.  Feel free to call the clinic should you have any questions or concerns at The clinic phone number is [phone].  Please show the [facility] at check-in to the Emergency Department and triage nurse.

## 2020-12-14 ENCOUNTER — Encounter: Payer: Self-pay | Admitting: Cardiology

## 2020-12-14 ENCOUNTER — Ambulatory Visit (INDEPENDENT_AMBULATORY_CARE_PROVIDER_SITE_OTHER): Payer: Medicare Other | Admitting: Cardiology

## 2020-12-14 VITALS — BP 138/80 | HR 78 | Ht 74.0 in | Wt 297.0 lb

## 2020-12-14 DIAGNOSIS — E782 Mixed hyperlipidemia: Secondary | ICD-10-CM | POA: Diagnosis not present

## 2020-12-14 DIAGNOSIS — I10 Essential (primary) hypertension: Secondary | ICD-10-CM | POA: Diagnosis not present

## 2020-12-14 DIAGNOSIS — I255 Ischemic cardiomyopathy: Secondary | ICD-10-CM | POA: Diagnosis not present

## 2020-12-14 DIAGNOSIS — C240 Malignant neoplasm of extrahepatic bile duct: Secondary | ICD-10-CM

## 2020-12-14 NOTE — Patient Instructions (Signed)

## 2020-12-14 NOTE — Progress Notes (Signed)
Cardiology Office Note:    Date:  12/14/2020   ID:  Gary [name], DOB 06/04/1941, MRN [id number]  PCP:  Bonnita Nasuti, MD  Cardiologist:  Jenne Campus, MD    Referring MD: Bonnita Nasuti, MD   Chief Complaint  Patient presents with  . Follow-up  I am weak and tired today, yesterday got my chemotherapy  History of Present Illness:    Gary [name] is a 80 y.o. male with past medical history significant for coronary artery disease, in July 11, 2020 he did have cardiac catheterization in face of acute non-STEMI.  He was find to have multiple lesions including the culprit lesion which was the mid and distal portion of the circumflex artery.  That was addressed with drug-eluting stent.  He was put on dual antiplatelets therapy and discharged home.  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  Etiology of this cardiomyopathy is at least partially ischemic. Comes today 2 months of follow-up overall complaining of having some dizziness he said when he gets up and start walking he will get dizzy not to the point of passing out, it does not happen when he lay down he does not happen when he sits does not happen at the moment of getting up just when he walks.  Again he had chemotherapy yesterday.  Looks tired and exhausted today.  Denies have any chest pain tightness squeezing pressure burning chest.  Past Medical History:  Diagnosis Date  . AKI (acute kidney injury) ([place]) 07/09/2020  . Allergic rhinitis 12/13/2020  . Anxiety   . Arthritis   . Benign prostatic hyperplasia with lower urinary tract symptoms 12/13/2020  . 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 Cardiomegaly 07/29/2020  . Cardiomyopathy ([place]) 04/24/2019  . CHF (congestive heart failure) ([place])   . 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 Chronic obstructive pulmonary disease ([place]) 12/13/2020  . Chronic pain syndrome 12/13/2020  . Chronic systolic heart failure ([place]) 12/13/2020  . Coronary  artery disease with PTCA and stenting of mid and distal circumflex artery on 07/11/2020 07/29/2020  . Diastolic congestive heart failure ([place]) 04/24/2019  . Dizziness 08/30/2020  . Dyspnea on exertion 04/24/2019  . Encounter for therapeutic drug level monitoring 06/15/2020  . Essential hypertension 04/24/2019  . Gastro-esophageal reflux disease without esophagitis 12/13/2020  . Generalized anxiety disorder 12/13/2020  . Hyperglycemia due to type 2 diabetes mellitus ([place]) 12/13/2020  . Hypertension   . Hypothyroidism 12/13/2020  . Liver tumor 12/27/2017   Formatting of this note might be different from the original. Adenocarcinoma  . 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 Memory loss 08/30/2020  . Migraine without aura, not intractable, without status migrainosus 08/30/2020  . Mild intermittent asthma 12/13/2020  . Mild major depression, single episode ([place]) 12/13/2020  . Mixed hyperlipidemia 12/13/2020  . NSTEMI (non-ST elevated myocardial infarction) ([place]) 07/09/2020  . Orthostatic hypotension 10/20/2020  . Other long term (current) drug therapy 12/13/2020  . Other vitamin B12 deficiency anemias 12/13/2020  . Pain in left hip 12/13/2020  . Preop cardiovascular exam 06/15/2020  . 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/30/2020  . Type 2 diabetes mellitus without complications ([place]) 12/31/8117  . Unsteadiness on feet 08/30/2020  . UTI (urinary tract infection) 01/08/2018  . Vitamin D deficiency 12/13/2020    Past Surgical History:  Procedure Laterality Date  . BILE DUCT EXPLORATION     To remove cancer  . CORONARY STENT INTERVENTION N/A 07/11/2020  Procedure: CORONARY STENT INTERVENTION;  Surgeon: Leonie Man, MD;  Location: [facility];  Service: Cardiovascular;  Laterality: N/A;  . LEFT HEART CATH AND CORONARY ANGIOGRAPHY N/A 07/11/2020   Procedure: LEFT HEART CATH AND CORONARY ANGIOGRAPHY;  Surgeon: Leonie Man, MD;  Location: [facility];  Service:  Cardiovascular;  Laterality: N/A;  . MOHS SURGERY     Basil cell on nose    Current Medications: Current Meds  Medication Sig  . ALPRAZolam (XANAX) 0.5 MG tablet Take 0.5 mg by mouth 3 (three) times daily as needed for anxiety.   Marland Kitchen aspirin EC 81 MG tablet Take 1 tablet (81 mg total) by mouth daily. Swallow whole.  Marland Kitchen atorvastatin (LIPITOR) 20 MG tablet Take 1 tablet (20 mg total) by mouth daily.  Marland Kitchen buPROPion (WELLBUTRIN XL) 300 MG 24 hr tablet Take 1 tablet by mouth daily. For 90 days  . calcium carbonate (TUMS - DOSED IN MG ELEMENTAL CALCIUM) 500 MG chewable tablet Chew 1 tablet by mouth in the morning and at bedtime. Total of 1200 mg daily  . carvedilol (COREG) 6.25 MG tablet Take 1 tablet by mouth twice daily (Patient taking differently: Take 6.25 mg by mouth 2 (two) times daily with a meal.)  . cholecalciferol (VITAMIN D3) 25 MCG (1000 UNIT) tablet Take 1,000 Units by mouth daily.  . clopidogrel (PLAVIX) 75 MG tablet Take 75 mg by mouth daily.  . furosemide (LASIX) 20 MG tablet Take 20 mg by mouth daily.  Marland Kitchen HYDROcodone-acetaminophen (NORCO) 10-325 MG tablet Take 1 tablet by mouth 3 (three) times daily as needed for moderate pain or severe pain.  Marland Kitchen losartan (COZAAR) 25 MG tablet Take 1 tablet by mouth in the morning and at bedtime.  . montelukast (SINGULAIR) 10 MG tablet Take 1 tablet by mouth daily.  . Multiple Vitamin (MULTIVITAMIN) tablet Take 1 tablet by mouth daily. Unknown Strength per patient  . nitroGLYCERIN (NITROSTAT) 0.4 MG SL tablet Place 1 tablet (0.4 mg total) under the tongue every 5 (five) minutes as needed for chest pain.  . Omega-3 Fatty Acids (FISH OIL) 1200 MG CPDR Take 1 tablet by mouth daily.  Marland Kitchen omeprazole (PRILOSEC) 40 MG capsule Take 1 capsule by mouth daily as needed (Acid reflux).  . ondansetron (ZOFRAN) 4 MG tablet TAKE 1 TABLET BY MOUTH EVERY 4 HOURS AS NEEDED FOR NAUSEA OR VOMITING (Patient taking differently: Take 4 mg by mouth every 8 (eight) hours as needed  for nausea or vomiting.)  . pantoprazole (PROTONIX) 40 MG tablet Take 40 mg by mouth daily.  . prochlorperazine (COMPAZINE) 10 MG tablet Take 1 tablet (10 mg total) by mouth every 6 (six) hours as needed for nausea or vomiting.  . sacubitril-valsartan (ENTRESTO) 24-26 MG Take 1 tablet by mouth daily. Once a day per patient  . tamsulosin (FLOMAX) 0.4 MG CAPS capsule Take 0.4 mg by mouth daily.   . vitamin B-12 (CYANOCOBALAMIN) 250 MCG tablet Take 250 mcg by mouth daily.  . Vitamin D, Ergocalciferol, 50 MCG (2000 UT) CAPS Take 1 tablet by mouth daily.  . [DISCONTINUED] diphenhydramine-acetaminophen (ACETAMINOPHEN PM) 25-500 MG TABS tablet 1 tablet 3 (three) times daily as needed (Pain).     Allergies:   Pork-derived products and Amoxicillin   Social History   Socioeconomic History  . Marital status: Widowed    Spouse name: Not on file  . Number of children: Not on file  . Years of education: Not on file  . Highest education level:  Not on file  Occupational History  . Not on file  Tobacco Use  . Smoking status: Never Smoker  . Smokeless tobacco: Current User    Types: Chew  Substance and Sexual Activity  . Alcohol use: Not Currently  . Drug use: Never  . Sexual activity: Not on file  Other Topics Concern  . Not on file  Social History Narrative  . Not on file   Social Determinants of Health   Financial Resource Strain: Not on file  Food Insecurity: Not on file  Transportation Needs: Not on file  Physical Activity: Not on file  Stress: Not on file  Social Connections: Not on file     Family History: The patient's family history includes Breast cancer in his sister and sister; Cancer in his father. ROS:   Please see the history of present illness.    All 14 point review of systems negative except as described per history of present illness  EKGs/Labs/Other Studies Reviewed:      Recent Labs: 07/08/2020: NT-Pro BNP 164 12/12/2020: ALT 15; BUN 18; Creatinine 1.3;  Hemoglobin 10.0; Magnesium 2.1; Platelets 207; Potassium 4.5; Sodium 138  Recent Lipid Panel    Component Value Date/Time   CHOL 139 07/09/2020 0354   TRIG 255 (H) 07/09/2020 0354   HDL 33 (L) 07/09/2020 0354   CHOLHDL 4.2 07/09/2020 0354   VLDL 51 (H) 07/09/2020 0354   LDLCALC 55 07/09/2020 0354    Physical Exam:    VS:  BP 138/80 (BP Location: Right Arm, Patient Position: Sitting)   Pulse 78   Ht 6\' 2"  (1.88 m)   Wt 297 lb (134.7 kg)   SpO2 93%   BMI 38.13 kg/m     Wt Readings from Last 3 Encounters:  12/14/20 297 lb (134.7 kg)  12/13/20 291 lb 12 oz (132.3 kg)  12/02/20 292 lb 11.2 oz (132.8 kg)     GEN:  Well nourished, well developed in no acute distress HEENT: Normal NECK: No JVD; No carotid bruits LYMPHATICS: No lymphadenopathy CARDIAC: RRR, no murmurs, no rubs, no gallops RESPIRATORY:  Clear to auscultation without rales, wheezing or rhonchi  ABDOMEN: Soft, non-tender, non-distended MUSCULOSKELETAL:  No edema; No deformity  SKIN: Warm and dry LOWER EXTREMITIES: no swelling NEUROLOGIC:  Alert and oriented x 3 PSYCHIATRIC:  Normal affect   ASSESSMENT:    1. Essential hypertension   2. Ischemic cardiomyopathy   3. 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 Mixed hyperlipidemia    PLAN:    In order of problems listed above:  1. Essential hypertension, blood pressure well controlled continue present management. 2. Dizziness.  Multifactorial but I suspect orthostatic hypotension which could be related to neuropathy play some role here.  I encouraged him to stay well-hydrated.  I do not see any indication for putting monitor on him since symptoms happening only when he walks. 3. Malignant tumor of extrahepatic bile duct.  He is being aggressively treated with chemotherapy. 4. Cardiomyopathy, I did review echocardiogram done just recently which showed ejection fraction 5055% which is improvement.  We will continue present medications.  Also last time  when he was in the hospital switch him from Brilinta to Plavix and that make him feel better.  In the meantime we will continue carvedilol as well as isosorbide mononitrate.   Medication Adjustments/Labs and Tests Ordered: Current medicines are reviewed at length with the patient today.  Concerns regarding medicines are outlined above.  No orders of the  defined types were placed in this encounter.  Medication changes: No orders of the defined types were placed in this encounter.   Signed, Park Liter, MD, [facility] 12/14/2020 2:33 PM    [place]

## 2020-12-15 ENCOUNTER — Telehealth: Payer: Self-pay | Admitting: Oncology

## 2020-12-15 NOTE — Telephone Encounter (Signed)
Patient called- Left Message regarding his next appt.  Patient scheduled for 3/28 Labs 2:30 pm - Follow Up w/Melissa 3:00 pm (Dr Bobby Rumpf Off)

## 2020-12-19 ENCOUNTER — Other Ambulatory Visit: Payer: Self-pay

## 2020-12-19 ENCOUNTER — Telehealth: Payer: Self-pay | Admitting: Hematology and Oncology

## 2020-12-19 ENCOUNTER — Inpatient Hospital Stay: Payer: Medicare Other

## 2020-12-19 ENCOUNTER — Inpatient Hospital Stay (INDEPENDENT_AMBULATORY_CARE_PROVIDER_SITE_OTHER): Payer: Medicare Other | Admitting: Hematology and Oncology

## 2020-12-19 VITALS — BP 119/60 | HR 86 | Temp 98.3°F | Resp 18 | Ht 74.0 in | Wt 290.4 lb

## 2020-12-19 DIAGNOSIS — C240 Malignant neoplasm of extrahepatic bile duct: Secondary | ICD-10-CM

## 2020-12-19 LAB — CBC AND DIFFERENTIAL
HCT: 33 — AB (ref 41–53)
Hemoglobin: 10.7 — AB (ref 13.5–17.5)
Neutrophils Absolute: 2.17
Platelets: 162 (ref 150–399)
WBC: 3.5

## 2020-12-19 LAB — BASIC METABOLIC PANEL
BUN: 23 — AB (ref 4–21)
CO2: 27 — AB (ref 13–22)
Chloride: 105 (ref 99–108)
Creatinine: 1 (ref 0.6–1.3)
Glucose: 124
Potassium: 4.8 (ref 3.4–5.3)
Sodium: 140 (ref 137–147)

## 2020-12-19 LAB — HEPATIC FUNCTION PANEL
ALT: 25 (ref 10–40)
AST: 37 (ref 14–40)
Alkaline Phosphatase: 43 (ref 25–125)
Bilirubin, Total: 0.8

## 2020-12-19 LAB — COMPREHENSIVE METABOLIC PANEL
Albumin: 4.1 (ref 3.5–5.0)
Calcium: 8.6 — AB (ref 8.7–10.7)

## 2020-12-19 LAB — MAGNESIUM: Magnesium: 1.7

## 2020-12-19 LAB — CBC: RBC: 3.55 — AB (ref 3.87–5.11)

## 2020-12-19 NOTE — Telephone Encounter (Signed)
12/19/20 Next appt scheduled and given to patient

## 2020-12-19 NOTE — Progress Notes (Signed)
Gary [name]  [address] 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  Current Treatment:  Cisplatin/ gemcitabine    HISTORY OF PRESENT ILLNESS:  Gary [name] is a 80 y.o. 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 He presents today for evaluation prior to his next cycle of chemotherapy. He states he has been a little light-headed and dizzy while up and about. He has had some diarrhea with his last treatment. His appetite if fair and he has lost 7 pounds since his last visit. He is mildly orthostatic today in clinic. CBC and CMP are unremarkable today.  REVIEW OF SYSTEMS:  Review of Systems  Constitutional: Positive for appetite change and fatigue. Negative for chills, diaphoresis, fever and unexpected weight change.  HENT:   Negative for hearing loss, lump/mass, mouth sores, nosebleeds, sore throat, tinnitus, trouble swallowing and voice change.   Eyes: Negative for eye problems and icterus.  Respiratory: Negative for chest tightness, cough, hemoptysis, shortness of breath and wheezing.   Cardiovascular: Negative for chest pain, leg swelling and palpitations.  Gastrointestinal: Positive for diarrhea. Negative for abdominal distention, abdominal pain, blood in stool, constipation, nausea, rectal pain and vomiting.  Endocrine: Negative for hot flashes.  Genitourinary: Negative for bladder incontinence, difficulty urinating, dyspareunia, dysuria, frequency, hematuria and nocturia.   Musculoskeletal: Negative for arthralgias, back pain, flank pain, gait problem, myalgias, neck pain and  neck stiffness.  Skin: Negative for itching, rash and wound.  Neurological: Positive for light-headedness. Negative for dizziness, extremity weakness, gait problem, headaches, numbness, seizures and speech difficulty.  Hematological: Negative for adenopathy. Does not bruise/bleed easily.  Psychiatric/Behavioral: Negative for confusion, decreased concentration, depression, sleep disturbance and suicidal ideas. The patient is not nervous/anxious.      VITALS:  Blood pressure 119/60, pulse 86, temperature 98.3 F (36.8 C), temperature source Oral, resp. rate 18, height 6' 2" (1.88 m), weight 290 lb 6.4 oz (131.7 kg), SpO2 93 %.  Wt Readings from Last 3 Encounters:  12/19/20 290 lb 6.4 oz (131.7 kg)  12/14/20 297 lb (134.7 kg)  12/13/20 291 lb 12 oz (132.3 kg)    Body mass index is 37.29 kg/m.  Performance status (ECOG): 1 - Symptomatic but completely ambulatory  PHYSICAL EXAM:  Physical Exam Constitutional:      General: He is not in acute distress.    Appearance: Normal appearance. He is normal weight. He is not ill-appearing, toxic-appearing or diaphoretic.  HENT:     Head: Normocephalic and atraumatic.     Nose: Nose normal. No congestion or rhinorrhea.     Mouth/Throat:     Mouth: Mucous membranes are moist.     Pharynx: Oropharynx is clear. No oropharyngeal exudate or posterior oropharyngeal erythema.  Eyes:     General: No scleral icterus.       Right eye: No discharge.        Left eye: No discharge.     Extraocular Movements: Extraocular movements intact.     Conjunctiva/sclera: Conjunctivae normal.     Pupils: Pupils are equal, round, and reactive to light.  Neck:     Vascular: No carotid bruit.  Cardiovascular:  Rate and Rhythm: Normal rate and regular rhythm.     Heart sounds: No murmur heard. No friction rub. No gallop.   Pulmonary:     Effort: Pulmonary effort is normal. No respiratory distress.     Breath sounds: Normal breath sounds. No stridor. No  wheezing, rhonchi or rales.  Chest:     Chest wall: No tenderness.  Abdominal:     General: Abdomen is flat. Bowel sounds are normal. There is no distension.     Palpations: There is no mass.     Tenderness: There is no abdominal tenderness. There is no right CVA tenderness, left CVA tenderness, guarding or rebound.     Hernia: No hernia is present.  Musculoskeletal:        General: No swelling, tenderness, deformity or signs of injury. Normal range of motion.     Cervical back: Normal range of motion and neck supple. No rigidity or tenderness.     Right lower leg: No edema.     Left lower leg: No edema.  Lymphadenopathy:     Cervical: No cervical adenopathy.  Skin:    General: Skin is warm and dry.     Capillary Refill: Capillary refill takes less than 2 seconds.     Coloration: Skin is not jaundiced or pale.     Findings: No bruising, erythema, lesion or rash.  Neurological:     General: No focal deficit present.     Mental Status: He is alert and oriented to person, place, and time. Mental status is at baseline.     Cranial Nerves: No cranial nerve deficit.     Sensory: No sensory deficit.     Motor: No weakness.     Coordination: Coordination normal.     Gait: Gait normal.     Deep Tendon Reflexes: Reflexes normal.  Psychiatric:        Mood and Affect: Mood normal.        Behavior: Behavior normal.        Thought Content: Thought content normal.        Judgment: Judgment normal.     LABS:   CBC Latest Ref Rng & Units 12/19/2020 12/12/2020 12/02/2020  WBC - 3.5 8.1 6.8  Hemoglobin 13.5 - 17.5 10.7(A) 10.0(A) 10.0(A)  Hematocrit 41 - 53 33(A) 30(A) 30(A)  Platelets 150 - 399 162 207 72(A)   CMP Latest Ref Rng & Units 12/19/2020 12/12/2020 12/02/2020  Glucose 70 - 99 mg/dL - - -  BUN 4 - 21 23(A) 18 28(A)  Creatinine 0.6 - 1.3 1.0 1.3 1.6(A)  Sodium 137 - 147 140 138 136(A)  Potassium 3.4 - 5.3 4.8 4.5 4.6  Chloride 99 - 108 105 106 102  CO2 13 - 22 27(A) 27(A) 28(A)   Calcium 8.7 - 10.7 8.6(A) 8.2(A) 8.5(A)  Total Protein 6.5 - 8.1 g/dL - - -  Total Bilirubin 0.3 - 1.2 mg/dL - - -  Alkaline Phos 25 - 125 43 48 58  AST 14 - 40 37 27 27  ALT 10 - 40 _0 No results found for: CEA1 / No results found for: CEA1 No results found for: PSA1 No results found for: JOI786 No results found for: CAN125  No results found for: TOTALPROTELP, ALBUMINELP, A1GS, A2GS, BETS, BETA2SER, GAMS, MSPIKE, SPEI No results found for: TIBC, FERRITIN, IRONPCTSAT No results found for: LDH  STUDIES:  No results found.    HISTORY:   Past Medical History:  Diagnosis Date  . AKI (acute kidney injury) ([place]) 07/09/2020  . Allergic rhinitis 12/13/2020  . Anxiety   . Arthritis   . Benign prostatic hyperplasia with lower urinary tract symptoms 12/13/2020  . 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 Cardiomegaly 07/29/2020  . Cardiomyopathy ([place]) 04/24/2019  . CHF (congestive heart failure) ([place])   . 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 Chronic obstructive pulmonary disease ([place]) 12/13/2020  . Chronic pain syndrome 12/13/2020  . Chronic systolic heart failure ([place]) 12/13/2020  . Coronary artery disease with PTCA and stenting of mid and distal circumflex artery on 07/11/2020 07/29/2020  . Diastolic congestive heart failure ([place]) 04/24/2019  . Dizziness 08/30/2020  . Dyspnea on exertion 04/24/2019  . Encounter for therapeutic drug level monitoring 06/15/2020  . Essential hypertension 04/24/2019  . Gastro-esophageal reflux disease without esophagitis 12/13/2020  . Generalized anxiety disorder 12/13/2020  . Hyperglycemia due to type 2 diabetes mellitus ([place]) 12/13/2020  . Hypertension   . Hypothyroidism 12/13/2020  . Liver tumor 12/27/2017   Formatting of this note might be different from the original. Adenocarcinoma  . 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 Memory loss 08/30/2020  . Migraine without aura, not intractable, without status migrainosus 08/30/2020  .  Mild intermittent asthma 12/13/2020  . Mild major depression, single episode ([place]) 12/13/2020  . Mixed hyperlipidemia 12/13/2020  . NSTEMI (non-ST elevated myocardial infarction) ([place]) 07/09/2020  . Orthostatic hypotension 10/20/2020  . Other long term (current) drug therapy 12/13/2020  . Other vitamin B12 deficiency anemias 12/13/2020  . Pain in left hip 12/13/2020  . Preop cardiovascular exam 06/15/2020  . 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/30/2020  . Type 2 diabetes mellitus without complications ([place]) 7/98/9211  . Unsteadiness on feet 08/30/2020  . UTI (urinary tract infection) 01/08/2018  . Vitamin D deficiency 12/13/2020    Past Surgical History:  Procedure Laterality Date  . BILE DUCT EXPLORATION     To remove cancer  . CORONARY STENT INTERVENTION N/A 07/11/2020   Procedure: CORONARY STENT INTERVENTION;  Surgeon: Leonie Man, MD;  Location: [facility];  Service: Cardiovascular;  Laterality: N/A;  . LEFT HEART CATH AND CORONARY ANGIOGRAPHY N/A 07/11/2020   Procedure: LEFT HEART CATH AND CORONARY ANGIOGRAPHY;  Surgeon: Leonie Man, MD;  Location: [facility];  Service: Cardiovascular;  Laterality: N/A;  . MOHS SURGERY     Basil cell on nose    Family History  Problem Relation Age of Onset  . Cancer Father   . Breast cancer Sister   . Breast cancer Sister     Social History:  reports that he has never smoked. His smokeless tobacco use includes chew. He reports previous alcohol use. He reports that he does not use drugs.The patient is alone  today.  Allergies:  Allergies  Allergen Reactions  . Pork-Derived Products Anaphylaxis  . Amoxicillin Rash and Other (See Comments)    Current Medications: Current Outpatient Medications  Medication Sig Dispense Refill  . ALPRAZolam (XANAX) 0.5 MG tablet Take 0.5 mg by mouth 3 (three) times daily as needed for anxiety.     Marland Kitchen aspirin EC 81 MG tablet Take 1 tablet (81 mg total) by mouth daily. Swallow whole. 90  tablet 0  . atorvastatin (LIPITOR) 20 MG tablet Take 1 tablet (20 mg total) by mouth daily. 90 tablet 1  . buPROPion (WELLBUTRIN XL) 300 MG 24 hr tablet Take 1 tablet by mouth daily. For 90 days    .  calcium carbonate (TUMS - DOSED IN MG ELEMENTAL CALCIUM) 500 MG chewable tablet Chew 1 tablet by mouth in the morning and at bedtime. Total of 1200 mg daily    . carvedilol (COREG) 6.25 MG tablet Take 1 tablet by mouth twice daily (Patient taking differently: Take 6.25 mg by mouth 2 (two) times daily with a meal.) 180 tablet 3  . cholecalciferol (VITAMIN D3) 25 MCG (1000 UNIT) tablet Take 1,000 Units by mouth daily.    . clopidogrel (PLAVIX) 75 MG tablet Take 75 mg by mouth daily.    . furosemide (LASIX) 20 MG tablet Take 20 mg by mouth daily.    Marland Kitchen HYDROcodone-acetaminophen (NORCO) 10-325 MG tablet Take 1 tablet by mouth 3 (three) times daily as needed for moderate pain or severe pain.    . isosorbide mononitrate (IMDUR) 30 MG 24 hr tablet Take 1 tablet by mouth once daily 90 tablet 0  . losartan (COZAAR) 25 MG tablet Take 1 tablet by mouth in the morning and at bedtime.    . montelukast (SINGULAIR) 10 MG tablet Take 1 tablet by mouth daily.    . Multiple Vitamin (MULTIVITAMIN) tablet Take 1 tablet by mouth daily. Unknown Strength per patient    . nitroGLYCERIN (NITROSTAT) 0.4 MG SL tablet Place 1 tablet (0.4 mg total) under the tongue every 5 (five) minutes as needed for chest pain. 25 tablet 0  . Omega-3 Fatty Acids (FISH OIL) 1200 MG CPDR Take 1 tablet by mouth daily.    Marland Kitchen omeprazole (PRILOSEC) 40 MG capsule Take 1 capsule by mouth daily as needed (Acid reflux).    . ondansetron (ZOFRAN) 4 MG tablet TAKE 1 TABLET BY MOUTH EVERY 4 HOURS AS NEEDED FOR NAUSEA OR VOMITING (Patient taking differently: Take 4 mg by mouth every 8 (eight) hours as needed for nausea or vomiting.) 30 tablet 2  . pantoprazole (PROTONIX) 40 MG tablet Take 40 mg by mouth daily.    . prochlorperazine (COMPAZINE) 10 MG tablet Take  1 tablet (10 mg total) by mouth every 6 (six) hours as needed for nausea or vomiting. 90 tablet 3  . sacubitril-valsartan (ENTRESTO) 24-26 MG Take 1 tablet by mouth daily. Once a day per patient    . tamsulosin (FLOMAX) 0.4 MG CAPS capsule Take 0.4 mg by mouth daily.     . vitamin B-12 (CYANOCOBALAMIN) 250 MCG tablet Take 250 mcg by mouth daily.    . Vitamin D, Ergocalciferol, 50 MCG (2000 UT) CAPS Take 1 tablet by mouth daily.     No current facility-administered medications for this visit.     ASSESSMENT & PLAN:   Assessment:  Gary [name] is a 80 y.o. male with metastatic FGFR2 mutation positive cholangiocarcinoma. He is tolerating treatment fairly well with some weakness and lightheadedness today.   Plan: We will continue with his next cycle of chemotherapy as well as extra IVF during treatment. He will return to clinic on 04-08 for repeat CBC, CMP and evaluation with Dr. Bobby Rumpf prior to cycle 4 chemotherapy.   He verbalizes understanding of and agreement to the plans discussed today. He knows to call the office should any new questions or concerns arise.    Melodye Ped, NP

## 2020-12-20 ENCOUNTER — Inpatient Hospital Stay: Payer: Medicare Other

## 2020-12-20 VITALS — BP 142/88 | HR 75 | Temp 98.1°F | Resp 18 | Ht 74.0 in | Wt 291.1 lb

## 2020-12-20 DIAGNOSIS — Z5111 Encounter for antineoplastic chemotherapy: Secondary | ICD-10-CM | POA: Diagnosis not present

## 2020-12-20 DIAGNOSIS — C240 Malignant neoplasm of extrahepatic bile duct: Secondary | ICD-10-CM

## 2020-12-20 MED ORDER — GEMCITABINE HCL CHEMO INJECTION 1 GM/26.3ML
1000.0000 mg/m2 | Freq: Once | INTRAVENOUS | Status: AC
  Administered 2020-12-20: 2660 mg via INTRAVENOUS
  Filled 2020-12-20: qty 52.6

## 2020-12-20 MED ORDER — SODIUM CHLORIDE 0.9 % IV SOLN
25.0000 mg/m2 | Freq: Once | INTRAVENOUS | Status: AC
  Administered 2020-12-20: 67 mg via INTRAVENOUS
  Filled 2020-12-20: qty 67

## 2020-12-20 MED ORDER — PALONOSETRON HCL INJECTION 0.25 MG/5ML
0.2500 mg | Freq: Once | INTRAVENOUS | Status: AC
  Administered 2020-12-20: 0.25 mg via INTRAVENOUS

## 2020-12-20 MED ORDER — MAGNESIUM SULFATE 2 GM/50ML IV SOLN
2.0000 g | Freq: Once | INTRAVENOUS | Status: AC
  Administered 2020-12-20: 2 g via INTRAVENOUS

## 2020-12-20 MED ORDER — SODIUM CHLORIDE 0.9 % IV SOLN
Freq: Once | INTRAVENOUS | Status: DC
  Filled 2020-12-20: qty 250

## 2020-12-20 MED ORDER — POTASSIUM CHLORIDE IN NACL 20-0.9 MEQ/L-% IV SOLN
INTRAVENOUS | Status: AC
  Filled 2020-12-20: qty 1000

## 2020-12-20 MED ORDER — SODIUM CHLORIDE 0.9 % IV SOLN
Freq: Once | INTRAVENOUS | Status: AC
  Filled 2020-12-20: qty 250

## 2020-12-20 MED ORDER — PALONOSETRON HCL INJECTION 0.25 MG/5ML
INTRAVENOUS | Status: AC
  Filled 2020-12-20: qty 5

## 2020-12-20 MED ORDER — SODIUM CHLORIDE 0.9 % IV SOLN
150.0000 mg | Freq: Once | INTRAVENOUS | Status: AC
  Administered 2020-12-20: 150 mg via INTRAVENOUS
  Filled 2020-12-20: qty 150

## 2020-12-20 MED ORDER — POTASSIUM CHLORIDE IN NACL 20-0.9 MEQ/L-% IV SOLN: Freq: Once | INTRAVENOUS | Status: AC

## 2020-12-20 MED ORDER — SODIUM CHLORIDE 0.9 % IV SOLN
10.0000 mg | Freq: Once | INTRAVENOUS | Status: AC
  Administered 2020-12-20: 10 mg via INTRAVENOUS
  Filled 2020-12-20: qty 10

## 2020-12-20 MED ORDER — MAGNESIUM SULFATE 2 GM/50ML IV SOLN
INTRAVENOUS | Status: AC
  Filled 2020-12-20: qty 50

## 2020-12-20 NOTE — Patient Instructions (Signed)
[facility] Discharge Instructions for Patients Receiving Chemotherapy  Today you received the following chemotherapy agents Cisplatin/Gemzar  To help prevent nausea and vomiting after your treatment, we encourage you to take your nausea medication as directed   If you develop nausea and vomiting that is not controlled by your nausea medication, call the clinic.   BELOW ARE SYMPTOMS THAT SHOULD BE REPORTED IMMEDIATELY:  *FEVER GREATER THAN 100.5 F  *CHILLS WITH OR WITHOUT FEVER  NAUSEA AND VOMITING THAT IS NOT CONTROLLED WITH YOUR NAUSEA MEDICATION  *UNUSUAL SHORTNESS OF BREATH  *UNUSUAL BRUISING OR BLEEDING  TENDERNESS IN MOUTH AND THROAT WITH OR WITHOUT PRESENCE OF ULCERS  *URINARY PROBLEMS  *BOWEL PROBLEMS  UNUSUAL RASH Items with * indicate a potential emergency and should be followed up as soon as possible.  Feel free to call the clinic should you have any questions or concerns at The clinic phone number is [phone].  Please show the [facility] at check-in to the Emergency Department and triage nurse.  Cisplatin injection What is this medicine? CISPLATIN (SIS pla tin) is a chemotherapy drug. It targets fast dividing cells, like cancer cells, and causes these cells to die. This medicine is used to treat many types of cancer like bladder, ovarian, and testicular cancers. This medicine may be used for other purposes; ask your health care provider or pharmacist if you have questions. COMMON BRAND NAME(S): Platinol, Platinol -AQ What should I tell my health care provider before I take this medicine? They need to know if you have any of these conditions:  eye disease, vision problems  hearing problems  kidney disease  low blood counts, like white cells, platelets, or red blood cells  tingling of the fingers or toes, or other nerve disorder  an unusual or allergic reaction to cisplatin, carboplatin, oxaliplatin, other medicines,  foods, dyes, or preservatives  pregnant or trying to get pregnant  breast-feeding How should I use this medicine? This drug is given as an infusion into a vein. It is administered in a hospital or clinic by a specially trained health care professional. Talk to your pediatrician regarding the use of this medicine in children. Special care may be needed. Overdosage: If you think you have taken too much of this medicine contact a poison control center or emergency room at once. NOTE: This medicine is only for you. Do not share this medicine with others. What if I miss a dose? It is important not to miss a dose. Call your doctor or health care professional if you are unable to keep an appointment. What may interact with this medicine? This medicine may interact with the following medications:  foscarnet  certain antibiotics like amikacin, gentamicin, neomycin, polymyxin B, streptomycin, tobramycin, vancomycin This list may not describe all possible interactions. Give your health care provider a list of all the medicines, herbs, non-prescription drugs, or dietary supplements you use. Also tell them if you smoke, drink alcohol, or use illegal drugs. Some items may interact with your medicine. What should I watch for while using this medicine? Your condition will be monitored carefully while you are receiving this medicine. You will need important blood work done while you are taking this medicine. This drug may make you feel generally unwell. This is not uncommon, as chemotherapy can affect healthy cells as well as cancer cells. Report any side effects. Continue your course of treatment even though you feel ill unless your doctor tells you to stop. This medicine  may increase your risk of getting an infection. Call your healthcare professional for advice if you get a fever, chills, or sore throat, or other symptoms of a cold or flu. Do not treat yourself. Try to avoid being around people who are  sick. Avoid taking medicines that contain aspirin, acetaminophen, ibuprofen, naproxen, or ketoprofen unless instructed by your healthcare professional. These medicines may hide a fever. This medicine may increase your risk to bruise or bleed. Call your doctor or health care professional if you notice any unusual bleeding. Be careful brushing and flossing your teeth or using a toothpick because you may get an infection or bleed more easily. If you have any dental work done, tell your dentist you are receiving this medicine. Do not become pregnant while taking this medicine or for 14 months after stopping it. Women should inform their healthcare professional if they wish to become pregnant or think they might be pregnant. Men should not father a child while taking this medicine and for 11 months after stopping it. There is potential for serious side effects to an unborn child. Talk to your healthcare professional for more information. Do not breast-feed an infant while taking this medicine. This medicine has caused ovarian failure in some women. This medicine may make it more difficult to get pregnant. Talk to your healthcare professional if you are concerned about your fertility. This medicine has caused decreased sperm counts in some men. This may make it more difficult to father a child. Talk to your healthcare professional if you are concerned about your fertility. Drink fluids as directed while you are taking this medicine. This will help protect your kidneys. Call your doctor or health care professional if you get diarrhea. Do not treat yourself. What side effects may I notice from receiving this medicine? Side effects that you should report to your doctor or health care professional as soon as possible:  allergic reactions like skin rash, itching or hives, swelling of the face, lips, or tongue  blurred vision  changes in vision  decreased hearing or ringing of the ears  nausea,  vomiting  pain, redness, or irritation at site where injected  pain, tingling, numbness in the hands or feet  signs and symptoms of bleeding such as bloody or black, tarry stools; red or dark brown urine; spitting up blood or brown material that looks like coffee grounds; red spots on the skin; unusual bruising or bleeding from the eyes, gums, or nose  signs and symptoms of infection like fever; chills; cough; sore throat; pain or trouble passing urine  signs and symptoms of kidney injury like trouble passing urine or change in the amount of urine  signs and symptoms of low red blood cells or anemia such as unusually weak or tired; feeling faint or lightheaded; falls; breathing problems Side effects that usually do not require medical attention (report to your doctor or health care professional if they continue or are bothersome):  loss of appetite  mouth sores  muscle cramps This list may not describe all possible side effects. Call your doctor for medical advice about side effects. You may report side effects to FDA at 1-800-FDA-1088. Where should I keep my medicine? This drug is given in a hospital or clinic and will not be stored at home. NOTE: This sheet is a summary. It may not cover all possible information. If you have questions about this medicine, talk to your doctor, pharmacist, or health care provider.  2021 Elsevier/Gold Standard (2018-09-05 15:59:17) Gemcitabine  injection What is this medicine? GEMCITABINE (jem SYE ta been) is a chemotherapy drug. This medicine is used to treat many types of cancer like breast cancer, lung cancer, pancreatic cancer, and ovarian cancer. This medicine may be used for other purposes; ask your health care provider or pharmacist if you have questions. COMMON BRAND NAME(S): Gemzar, Infugem What should I tell my health care provider before I take this medicine? They need to know if you have any of these conditions:  blood  disorders  infection  kidney disease  liver disease  lung or breathing disease, like asthma  recent or ongoing radiation therapy  an unusual or allergic reaction to gemcitabine, other chemotherapy, other medicines, foods, dyes, or preservatives  pregnant or trying to get pregnant  breast-feeding How should I use this medicine? This drug is given as an infusion into a vein. It is administered in a hospital or clinic by a specially trained health care professional. Talk to your pediatrician regarding the use of this medicine in children. Special care may be needed. Overdosage: If you think you have taken too much of this medicine contact a poison control center or emergency room at once. NOTE: This medicine is only for you. Do not share this medicine with others. What if I miss a dose? It is important not to miss your dose. Call your doctor or health care professional if you are unable to keep an appointment. What may interact with this medicine?  medicines to increase blood counts like filgrastim, pegfilgrastim, sargramostim  some other chemotherapy drugs like cisplatin  vaccines Talk to your doctor or health care professional before taking any of these medicines:  acetaminophen  aspirin  ibuprofen  ketoprofen  naproxen This list may not describe all possible interactions. Give your health care provider a list of all the medicines, herbs, non-prescription drugs, or dietary supplements you use. Also tell them if you smoke, drink alcohol, or use illegal drugs. Some items may interact with your medicine. What should I watch for while using this medicine? Visit your doctor for checks on your progress. This drug may make you feel generally unwell. This is not uncommon, as chemotherapy can affect healthy cells as well as cancer cells. Report any side effects. Continue your course of treatment even though you feel ill unless your doctor tells you to stop. In some cases, you may  be given additional medicines to help with side effects. Follow all directions for their use. Call your doctor or health care professional for advice if you get a fever, chills or sore throat, or other symptoms of a cold or flu. Do not treat yourself. This drug decreases your body's ability to fight infections. Try to avoid being around people who are sick. This medicine may increase your risk to bruise or bleed. Call your doctor or health care professional if you notice any unusual bleeding. Be careful brushing and flossing your teeth or using a toothpick because you may get an infection or bleed more easily. If you have any dental work done, tell your dentist you are receiving this medicine. Avoid taking products that contain aspirin, acetaminophen, ibuprofen, naproxen, or ketoprofen unless instructed by your doctor. These medicines may hide a fever. Do not become pregnant while taking this medicine or for 6 months after stopping it. Women should inform their doctor if they wish to become pregnant or think they might be pregnant. Men should not father a child while taking this medicine and for 3 months after stopping it.  There is a potential for serious side effects to an unborn child. Talk to your health care professional or pharmacist for more information. Do not breast-feed an infant while taking this medicine or for at least 1 week after stopping it. Men should inform their doctors if they wish to father a child. This medicine may lower sperm counts. Talk with your doctor or health care professional if you are concerned about your fertility. What side effects may I notice from receiving this medicine? Side effects that you should report to your doctor or health care professional as soon as possible:  allergic reactions like skin rash, itching or hives, swelling of the face, lips, or tongue  breathing problems  pain, redness, or irritation at site where injected  signs and symptoms of a  dangerous change in heartbeat or heart rhythm like chest pain; dizziness; fast or irregular heartbeat; palpitations; feeling faint or lightheaded, falls; breathing problems  signs of decreased platelets or bleeding - bruising, pinpoint red spots on the skin, black, tarry stools, blood in the urine  signs of decreased red blood cells - unusually weak or tired, feeling faint or lightheaded, falls  signs of infection - fever or chills, cough, sore throat, pain or difficulty passing urine  signs and symptoms of kidney injury like trouble passing urine or change in the amount of urine  signs and symptoms of liver injury like dark yellow or brown urine; general ill feeling or flu-like symptoms; light-colored stools; loss of appetite; nausea; right upper belly pain; unusually weak or tired; yellowing of the eyes or skin  swelling of ankles, feet, hands Side effects that usually do not require medical attention (report to your doctor or health care professional if they continue or are bothersome):  constipation  diarrhea  hair loss  loss of appetite  nausea  rash  vomiting This list may not describe all possible side effects. Call your doctor for medical advice about side effects. You may report side effects to FDA at 1-800-FDA-1088. Where should I keep my medicine? This drug is given in a hospital or clinic and will not be stored at home. NOTE: This sheet is a summary. It may not cover all possible information. If you have questions about this medicine, talk to your doctor, pharmacist, or health care provider.  2021 Elsevier/Gold Standard (2017-12-04 18:06:11)

## 2020-12-20 NOTE — Progress Notes (Signed)
Pt d/c stable at 1558

## 2020-12-29 ENCOUNTER — Telehealth: Payer: Self-pay

## 2020-12-29 ENCOUNTER — Other Ambulatory Visit: Payer: Self-pay | Admitting: Oncology

## 2020-12-29 ENCOUNTER — Encounter: Payer: Self-pay | Admitting: Hematology and Oncology

## 2020-12-29 NOTE — Telephone Encounter (Signed)
Pt has been referred to the palliative care program by pt's [facility] insurance staff. Stacy wanted to make sure that Dr Bobby Rumpf was agreeable to that.  Dr Bobby Rumpf isn't in clinic on Wednesday afternoons, thus why I am just calling the back.   I spoke with Ernest Pine this morning (12/29/20). I notified her that Dr Bobby Rumpf is agreeable for pt to enter the palliative care program. She confirmed that this is not Hospice.

## 2020-12-29 NOTE — Progress Notes (Signed)
[facility]  [address] [place],  [place]  [address] [phone]  Clinic Day:  12/02/2020  Referring physician: Bonnita Nasuti, MD   HISTORY OF PRESENT ILLNESS:  The patient is a 80 y.o. 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  He comes in today to be evaluated before heading into his 4th cycle of cisplatin/gemcitabine, which he receives weekly, every 2/3 weeks.  At his last visit, due to his respiratory issues, he was sent to the emergency room to rule out any cardiac complications.  As coronary artery disease was ruled out, the patient did end up receiving his 3rd cycle of chemotherapy since his last visit.  The patient claims to have had a steady level of fatigue for the entire past few weeks, which impacts his ability to be as active as he would like.  He denies having abdominal pain or other GI symptoms which concern him for disease progression.    PHYSICAL EXAM:  Blood pressure 138/78, pulse 78, temperature 97.8 F (36.6 C), resp. rate 16, height _0  (1.88 m), weight 294 lb 4.8 oz (133.5 kg), SpO2 92 %. Wt Readings from Last 3 Encounters:  12/30/20 294 lb 4.8 oz (133.5 kg)  12/20/20 291 lb 1.3 oz (132 kg)  12/19/20 290 lb 6.4 oz (131.7 kg)   Body mass index is 37.79 kg/m. Performance status (ECOG): 1 - Symptomatic but completely ambulatory Physical Exam Constitutional:      Appearance: Normal appearance. He is not ill-appearing.  HENT:     Mouth/Throat:     Mouth: Mucous membranes are moist.     Pharynx: Oropharynx is clear. No oropharyngeal exudate or posterior oropharyngeal erythema.  Cardiovascular:     Rate and Rhythm: Normal rate and regular rhythm.     Heart sounds: No murmur heard. No friction rub. No gallop.   Pulmonary:     Effort: Pulmonary effort is normal. No respiratory distress.     Breath sounds:  Normal breath sounds. No wheezing, rhonchi or rales.  Chest:  Breasts:     Right: No axillary adenopathy or supraclavicular adenopathy.     Left: No axillary adenopathy or supraclavicular adenopathy.    Abdominal:     General: Bowel sounds are normal. There is no distension.     Palpations: Abdomen is soft. There is no mass (no palpable abdominal passes are appreciated).     Tenderness: There is no abdominal tenderness.  Musculoskeletal:        General: No swelling.     Right lower leg: No edema.     Left lower leg: No edema.  Lymphadenopathy:     Cervical: No cervical adenopathy.     Upper Body:     Right upper body: No supraclavicular or axillary adenopathy.     Left upper body: No supraclavicular or axillary adenopathy.     Lower Body: No right inguinal adenopathy. No left inguinal adenopathy.  Skin:    General: Skin is warm.     Coloration: Skin is not jaundiced.     Findings: No lesion or rash.  Neurological:     General: No focal deficit present.     Mental Status: He is alert and oriented to person, place, and time. Mental status is at baseline.     Cranial Nerves: Cranial nerves are intact.  Psychiatric:        Mood and Affect:  Mood normal.        Thought Content: Thought content normal.    LABS:    Ref. Range 12/30/2020 00:00  Sodium Latest Ref Range: 137 - 147  141  Potassium Latest Ref Range: 3.4 - 5.3  4.0  Chloride Latest Ref Range: 99 - 108  106  CO2 Latest Ref Range: 13 - 22  29 (A)  Glucose Unknown 142  BUN Latest Ref Range: 4 - 21  22 (A)  Creatinine Latest Ref Range: 0.6 - 1.3  1.2  Calcium Latest Ref Range: 8.7 - 10.7  8.3 (A)  Magnesium Latest Ref Range: 1.6 - 2.3  1.6  Alkaline Phosphatase Latest Ref Range: 25 - 125  50  Albumin Latest Ref Range: 3.5 - 5.0  4.0  AST Latest Ref Range: 14 - 40  25  ALT Latest Ref Range: 10 - 40  18  Bilirubin, Total Unknown 0.4  WBC Unknown 3.5  RBC Latest Ref Range: 3.87 - 5.11  3.02 (A)  Hemoglobin Latest Ref  Range: 13.5 - 17.5  9.2 (A)  HCT Latest Ref Range: 41 - 53  28 (A)  MCV Latest Ref Range: 80 - 94  93  Platelets Latest Ref Range: 150 - 399  48 (A)    ASSESSMENT & PLAN:  Assessment/Plan:  A 80 y.o. male with metastatic FGFR2 mutation positive cholangiocarcinoma.  His fatigue appears to be related to his chemotherapy-induced anemia.  His platelets are also low from his chemotherapy.  I will recheck his CBC early next week.  If his platelets get above 50, he will proceed with his 4th cycle of cisplatin/gemcitabine next week.  If his hemoglobin is still below 10, Retacrit 30,000 units will be given with his 4th cycle of chemotherapy.  If his platelets remain < 50, his 4th cycle of chemotherapy will be delayed by 1 week.  As his palliative chemotherapy is impacting his daily quality of life, his chemotherapy doses will be decreased by 25%.  I will see this patient back in 3 weeks.  An abdominal MRI will be repeated before his next visit to ascertain his new disease baseline after 4 cycles of cisplatin/gemcitiabine.  The patient understands all the plans discussed today and is in agreement with them.    Arizbeth Cawthorn Macarthur Critchley, MD

## 2020-12-29 NOTE — Telephone Encounter (Signed)
Attempted to contact patient's son Corene Cornea to schedule a Palliative Care consult appointment. No answer left a message to return call.

## 2020-12-30 ENCOUNTER — Inpatient Hospital Stay (INDEPENDENT_AMBULATORY_CARE_PROVIDER_SITE_OTHER): Payer: Medicare Other | Admitting: Oncology

## 2020-12-30 ENCOUNTER — Other Ambulatory Visit: Payer: Self-pay | Admitting: Hematology and Oncology

## 2020-12-30 ENCOUNTER — Other Ambulatory Visit: Payer: Self-pay

## 2020-12-30 ENCOUNTER — Telehealth: Payer: Self-pay | Admitting: Oncology

## 2020-12-30 ENCOUNTER — Other Ambulatory Visit: Payer: Self-pay | Admitting: Oncology

## 2020-12-30 ENCOUNTER — Inpatient Hospital Stay: Payer: Medicare Other | Attending: Oncology

## 2020-12-30 VITALS — BP 138/78 | HR 78 | Temp 97.8°F | Resp 16 | Ht 74.0 in | Wt 294.3 lb

## 2020-12-30 DIAGNOSIS — D6481 Anemia due to antineoplastic chemotherapy: Secondary | ICD-10-CM | POA: Insufficient documentation

## 2020-12-30 DIAGNOSIS — C220 Liver cell carcinoma: Secondary | ICD-10-CM | POA: Insufficient documentation

## 2020-12-30 DIAGNOSIS — Z5111 Encounter for antineoplastic chemotherapy: Secondary | ICD-10-CM | POA: Insufficient documentation

## 2020-12-30 DIAGNOSIS — T451X5A Adverse effect of antineoplastic and immunosuppressive drugs, initial encounter: Secondary | ICD-10-CM

## 2020-12-30 DIAGNOSIS — I255 Ischemic cardiomyopathy: Secondary | ICD-10-CM | POA: Diagnosis not present

## 2020-12-30 DIAGNOSIS — C240 Malignant neoplasm of extrahepatic bile duct: Secondary | ICD-10-CM

## 2020-12-30 DIAGNOSIS — C786 Secondary malignant neoplasm of retroperitoneum and peritoneum: Secondary | ICD-10-CM | POA: Insufficient documentation

## 2020-12-30 DIAGNOSIS — Z79899 Other long term (current) drug therapy: Secondary | ICD-10-CM | POA: Insufficient documentation

## 2020-12-30 LAB — CBC
MCV: 93 (ref 80–94)
RBC: 3.02 — AB (ref 3.87–5.11)

## 2020-12-30 LAB — BASIC METABOLIC PANEL
BUN: 22 — AB (ref 4–21)
CO2: 29 — AB (ref 13–22)
Chloride: 106 (ref 99–108)
Creatinine: 1.2 (ref 0.6–1.3)
Glucose: 142
Potassium: 4 (ref 3.4–5.3)
Sodium: 141 (ref 137–147)

## 2020-12-30 LAB — COMPREHENSIVE METABOLIC PANEL
Albumin: 4 (ref 3.5–5.0)
Calcium: 8.3 — AB (ref 8.7–10.7)

## 2020-12-30 LAB — CBC AND DIFFERENTIAL
HCT: 28 — AB (ref 41–53)
Hemoglobin: 9.2 — AB (ref 13.5–17.5)
Neutrophils Absolute: 1.93
Platelets: 48 — AB (ref 150–399)
WBC: 3.5

## 2020-12-30 LAB — MAGNESIUM: Magnesium: 1.6 (ref 1.6–2.3)

## 2020-12-30 LAB — HEPATIC FUNCTION PANEL
ALT: 18 (ref 10–40)
AST: 25 (ref 14–40)
Alkaline Phosphatase: 50 (ref 25–125)
Bilirubin, Total: 0.4

## 2020-12-30 NOTE — Telephone Encounter (Signed)
Per 4/8 los next appt scheduled and given to patient 

## 2021-01-02 ENCOUNTER — Inpatient Hospital Stay: Payer: Medicare Other

## 2021-01-02 ENCOUNTER — Other Ambulatory Visit: Payer: Self-pay | Admitting: Hematology and Oncology

## 2021-01-02 ENCOUNTER — Telehealth: Payer: Self-pay

## 2021-01-02 DIAGNOSIS — C240 Malignant neoplasm of extrahepatic bile duct: Secondary | ICD-10-CM

## 2021-01-02 LAB — CBC AND DIFFERENTIAL
HCT: 29 — AB (ref 41–53)
Hemoglobin: 9.6 — AB (ref 13.5–17.5)
Neutrophils Absolute: 2.62
Platelets: 205 (ref 150–399)
WBC: 4.3

## 2021-01-02 LAB — COMPREHENSIVE METABOLIC PANEL
Albumin: 4.1 (ref 3.5–5.0)
Calcium: 8.9 (ref 8.7–10.7)

## 2021-01-02 LAB — BASIC METABOLIC PANEL
BUN: 15 (ref 4–21)
CO2: 30 — AB (ref 13–22)
Chloride: 106 (ref 99–108)
Creatinine: 1.2 (ref 0.6–1.3)
Glucose: 119
Potassium: 4.3 (ref 3.4–5.3)
Sodium: 141 (ref 137–147)

## 2021-01-02 LAB — MAGNESIUM: Magnesium: 2.2 (ref 1.6–2.3)

## 2021-01-02 LAB — HEPATIC FUNCTION PANEL
ALT: 17 (ref 10–40)
AST: 27 (ref 14–40)
Alkaline Phosphatase: 52 (ref 25–125)
Bilirubin, Total: 0.5

## 2021-01-02 LAB — CBC
MCV: 94 (ref 80–94)
RBC: 3.12 — AB (ref 3.87–5.11)

## 2021-01-02 NOTE — Telephone Encounter (Signed)
Attempted to contact patient's son Gary [name] to schedule a Palliative Care consult appointment. No answer left unable to leave a message voicemail is full.

## 2021-01-03 ENCOUNTER — Inpatient Hospital Stay: Payer: Medicare Other

## 2021-01-03 ENCOUNTER — Other Ambulatory Visit: Payer: Self-pay

## 2021-01-03 VITALS — BP 111/58 | HR 87 | Temp 97.5°F | Resp 18 | Ht 74.0 in | Wt 297.0 lb

## 2021-01-03 DIAGNOSIS — C240 Malignant neoplasm of extrahepatic bile duct: Secondary | ICD-10-CM

## 2021-01-03 DIAGNOSIS — C786 Secondary malignant neoplasm of retroperitoneum and peritoneum: Secondary | ICD-10-CM | POA: Diagnosis not present

## 2021-01-03 DIAGNOSIS — T451X5A Adverse effect of antineoplastic and immunosuppressive drugs, initial encounter: Secondary | ICD-10-CM

## 2021-01-03 DIAGNOSIS — Z79899 Other long term (current) drug therapy: Secondary | ICD-10-CM | POA: Diagnosis not present

## 2021-01-03 DIAGNOSIS — Z5111 Encounter for antineoplastic chemotherapy: Secondary | ICD-10-CM | POA: Diagnosis not present

## 2021-01-03 DIAGNOSIS — D6481 Anemia due to antineoplastic chemotherapy: Secondary | ICD-10-CM

## 2021-01-03 DIAGNOSIS — C220 Liver cell carcinoma: Secondary | ICD-10-CM | POA: Diagnosis present

## 2021-01-03 MED ORDER — EPOETIN ALFA-EPBX 40000 UNIT/ML IJ SOLN
30000.0000 [IU] | Freq: Once | INTRAMUSCULAR | Status: AC
  Administered 2021-01-03: 30000 [IU] via SUBCUTANEOUS

## 2021-01-03 MED ORDER — SODIUM CHLORIDE 0.9 % IV SOLN
150.0000 mg | Freq: Once | INTRAVENOUS | Status: AC
  Administered 2021-01-03: 150 mg via INTRAVENOUS
  Filled 2021-01-03: qty 150

## 2021-01-03 MED ORDER — SODIUM CHLORIDE 0.9 % IV SOLN
750.0000 mg/m2 | Freq: Once | INTRAVENOUS | Status: AC
  Administered 2021-01-03: 2014 mg via INTRAVENOUS
  Filled 2021-01-03: qty 52.97

## 2021-01-03 MED ORDER — EPOETIN ALFA-EPBX 40000 UNIT/ML IJ SOLN
INTRAMUSCULAR | Status: AC
  Filled 2021-01-03: qty 1

## 2021-01-03 MED ORDER — DEXAMETHASONE SODIUM PHOSPHATE 100 MG/10ML IJ SOLN
10.0000 mg | Freq: Once | INTRAMUSCULAR | Status: AC
  Administered 2021-01-03: 10 mg via INTRAVENOUS
  Filled 2021-01-03: qty 1

## 2021-01-03 MED ORDER — EPOETIN ALFA-EPBX 10000 UNIT/ML IJ SOLN: 10000.0000 [IU] | Freq: Once | INTRAMUSCULAR | Status: DC

## 2021-01-03 MED ORDER — SODIUM CHLORIDE 0.9 % IV SOLN
Freq: Once | INTRAVENOUS | Status: AC
  Filled 2021-01-03: qty 250

## 2021-01-03 MED ORDER — PALONOSETRON HCL INJECTION 0.25 MG/5ML
INTRAVENOUS | Status: AC
  Filled 2021-01-03: qty 5

## 2021-01-03 MED ORDER — EPOETIN ALFA-EPBX 40000 UNIT/ML IJ SOLN: 20000.0000 [IU] | Freq: Once | INTRAMUSCULAR | Status: DC

## 2021-01-03 MED ORDER — POTASSIUM CHLORIDE IN NACL 20-0.9 MEQ/L-% IV SOLN: Freq: Once | INTRAVENOUS | Status: AC

## 2021-01-03 MED ORDER — POTASSIUM CHLORIDE IN NACL 20-0.9 MEQ/L-% IV SOLN
INTRAVENOUS | Status: AC
  Filled 2021-01-03: qty 1000

## 2021-01-03 MED ORDER — SODIUM CHLORIDE 0.9 % IV SOLN
18.7500 mg/m2 | Freq: Once | INTRAVENOUS | Status: AC
  Administered 2021-01-03: 50 mg via INTRAVENOUS
  Filled 2021-01-03: qty 50

## 2021-01-03 MED ORDER — SODIUM CHLORIDE 0.9% FLUSH
10.0000 mL | INTRAVENOUS | Status: DC | PRN
  Administered 2021-01-03: 10 mL
  Filled 2021-01-03: qty 10

## 2021-01-03 MED ORDER — PALONOSETRON HCL INJECTION 0.25 MG/5ML
0.2500 mg | Freq: Once | INTRAVENOUS | Status: AC
  Administered 2021-01-03: 0.25 mg via INTRAVENOUS

## 2021-01-03 NOTE — Patient Instructions (Signed)
Gemcitabine injection What is this medicine? GEMCITABINE (jem SYE ta been) is a chemotherapy drug. This medicine is used to treat many types of cancer like breast cancer, lung cancer, pancreatic cancer, and ovarian cancer. This medicine may be used for other purposes; ask your health care provider or pharmacist if you have questions. COMMON BRAND NAME(S): Gemzar, Infugem What should I tell my health care provider before I take this medicine? They need to know if you have any of these conditions:  blood disorders  infection  kidney disease  liver disease  lung or breathing disease, like asthma  recent or ongoing radiation therapy  an unusual or allergic reaction to gemcitabine, other chemotherapy, other medicines, foods, dyes, or preservatives  pregnant or trying to get pregnant  breast-feeding How should I use this medicine? This drug is given as an infusion into a vein. It is administered in a hospital or clinic by a specially trained health care professional. Talk to your pediatrician regarding the use of this medicine in children. Special care may be needed. Overdosage: If you think you have taken too much of this medicine contact a poison control center or emergency room at once. NOTE: This medicine is only for you. Do not share this medicine with others. What if I miss a dose? It is important not to miss your dose. Call your doctor or health care professional if you are unable to keep an appointment. What may interact with this medicine?  medicines to increase blood counts like filgrastim, pegfilgrastim, sargramostim  some other chemotherapy drugs like cisplatin  vaccines Talk to your doctor or health care professional before taking any of these medicines:  acetaminophen  aspirin  ibuprofen  ketoprofen  naproxen This list may not describe all possible interactions. Give your health care provider a list of all the medicines, herbs, non-prescription drugs, or  dietary supplements you use. Also tell them if you smoke, drink alcohol, or use illegal drugs. Some items may interact with your medicine. What should I watch for while using this medicine? Visit your doctor for checks on your progress. This drug may make you feel generally unwell. This is not uncommon, as chemotherapy can affect healthy cells as well as cancer cells. Report any side effects. Continue your course of treatment even though you feel ill unless your doctor tells you to stop. In some cases, you may be given additional medicines to help with side effects. Follow all directions for their use. Call your doctor or health care professional for advice if you get a fever, chills or sore throat, or other symptoms of a cold or flu. Do not treat yourself. This drug decreases your body's ability to fight infections. Try to avoid being around people who are sick. This medicine may increase your risk to bruise or bleed. Call your doctor or health care professional if you notice any unusual bleeding. Be careful brushing and flossing your teeth or using a toothpick because you may get an infection or bleed more easily. If you have any dental work done, tell your dentist you are receiving this medicine. Avoid taking products that contain aspirin, acetaminophen, ibuprofen, naproxen, or ketoprofen unless instructed by your doctor. These medicines may hide a fever. Do not become pregnant while taking this medicine or for 6 months after stopping it. Women should inform their doctor if they wish to become pregnant or think they might be pregnant. Men should not father a child while taking this medicine and for 3 months after stopping it.   There is a potential for serious side effects to an unborn child. Talk to your health care professional or pharmacist for more information. Do not breast-feed an infant while taking this medicine or for at least 1 week after stopping it. Men should inform their doctors if they wish  to father a child. This medicine may lower sperm counts. Talk with your doctor or health care professional if you are concerned about your fertility. What side effects may I notice from receiving this medicine? Side effects that you should report to your doctor or health care professional as soon as possible:  allergic reactions like skin rash, itching or hives, swelling of the face, lips, or tongue  breathing problems  pain, redness, or irritation at site where injected  signs and symptoms of a dangerous change in heartbeat or heart rhythm like chest pain; dizziness; fast or irregular heartbeat; palpitations; feeling faint or lightheaded, falls; breathing problems  signs of decreased platelets or bleeding - bruising, pinpoint red spots on the skin, black, tarry stools, blood in the urine  signs of decreased red blood cells - unusually weak or tired, feeling faint or lightheaded, falls  signs of infection - fever or chills, cough, sore throat, pain or difficulty passing urine  signs and symptoms of kidney injury like trouble passing urine or change in the amount of urine  signs and symptoms of liver injury like dark yellow or brown urine; general ill feeling or flu-like symptoms; light-colored stools; loss of appetite; nausea; right upper belly pain; unusually weak or tired; yellowing of the eyes or skin  swelling of ankles, feet, hands Side effects that usually do not require medical attention (report to your doctor or health care professional if they continue or are bothersome):  constipation  diarrhea  hair loss  loss of appetite  nausea  rash  vomiting This list may not describe all possible side effects. Call your doctor for medical advice about side effects. You may report side effects to FDA at 1-800-FDA-1088. Where should I keep my medicine? This drug is given in a hospital or clinic and will not be stored at home. NOTE: This sheet is a summary. It may not cover all  possible information. If you have questions about this medicine, talk to your doctor, pharmacist, or health care provider.  2021 Elsevier/Gold Standard (2017-12-04 18:06:11) Cisplatin injection What is this medicine? CISPLATIN (SIS pla tin) is a chemotherapy drug. It targets fast dividing cells, like cancer cells, and causes these cells to die. This medicine is used to treat many types of cancer like bladder, ovarian, and testicular cancers. This medicine may be used for other purposes; ask your health care provider or pharmacist if you have questions. COMMON BRAND NAME(S): Platinol, Platinol -AQ What should I tell my health care provider before I take this medicine? They need to know if you have any of these conditions:  eye disease, vision problems  hearing problems  kidney disease  low blood counts, like white cells, platelets, or red blood cells  tingling of the fingers or toes, or other nerve disorder  an unusual or allergic reaction to cisplatin, carboplatin, oxaliplatin, other medicines, foods, dyes, or preservatives  pregnant or trying to get pregnant  breast-feeding How should I use this medicine? This drug is given as an infusion into a vein. It is administered in a hospital or clinic by a specially trained health care professional. Talk to your pediatrician regarding the use of this medicine in children. Special care may  be needed. Overdosage: If you think you have taken too much of this medicine contact a poison control center or emergency room at once. NOTE: This medicine is only for you. Do not share this medicine with others. What if I miss a dose? It is important not to miss a dose. Call your doctor or health care professional if you are unable to keep an appointment. What may interact with this medicine? This medicine may interact with the following medications:  foscarnet  certain antibiotics like amikacin, gentamicin, neomycin, polymyxin B, streptomycin,  tobramycin, vancomycin This list may not describe all possible interactions. Give your health care provider a list of all the medicines, herbs, non-prescription drugs, or dietary supplements you use. Also tell them if you smoke, drink alcohol, or use illegal drugs. Some items may interact with your medicine. What should I watch for while using this medicine? Your condition will be monitored carefully while you are receiving this medicine. You will need important blood work done while you are taking this medicine. This drug may make you feel generally unwell. This is not uncommon, as chemotherapy can affect healthy cells as well as cancer cells. Report any side effects. Continue your course of treatment even though you feel ill unless your doctor tells you to stop. This medicine may increase your risk of getting an infection. Call your healthcare professional for advice if you get a fever, chills, or sore throat, or other symptoms of a cold or flu. Do not treat yourself. Try to avoid being around people who are sick. Avoid taking medicines that contain aspirin, acetaminophen, ibuprofen, naproxen, or ketoprofen unless instructed by your healthcare professional. These medicines may hide a fever. This medicine may increase your risk to bruise or bleed. Call your doctor or health care professional if you notice any unusual bleeding. Be careful brushing and flossing your teeth or using a toothpick because you may get an infection or bleed more easily. If you have any dental work done, tell your dentist you are receiving this medicine. Do not become pregnant while taking this medicine or for 14 months after stopping it. Women should inform their healthcare professional if they wish to become pregnant or think they might be pregnant. Men should not father a child while taking this medicine and for 11 months after stopping it. There is potential for serious side effects to an unborn child. Talk to your healthcare  professional for more information. Do not breast-feed an infant while taking this medicine. This medicine has caused ovarian failure in some women. This medicine may make it more difficult to get pregnant. Talk to your healthcare professional if you are concerned about your fertility. This medicine has caused decreased sperm counts in some men. This may make it more difficult to father a child. Talk to your healthcare professional if you are concerned about your fertility. Drink fluids as directed while you are taking this medicine. This will help protect your kidneys. Call your doctor or health care professional if you get diarrhea. Do not treat yourself. What side effects may I notice from receiving this medicine? Side effects that you should report to your doctor or health care professional as soon as possible:  allergic reactions like skin rash, itching or hives, swelling of the face, lips, or tongue  blurred vision  changes in vision  decreased hearing or ringing of the ears  nausea, vomiting  pain, redness, or irritation at site where injected  pain, tingling, numbness in the hands or feet  signs and symptoms of bleeding such as bloody or black, tarry stools; red or dark brown urine; spitting up blood or brown material that looks like coffee grounds; red spots on the skin; unusual bruising or bleeding from the eyes, gums, or nose  signs and symptoms of infection like fever; chills; cough; sore throat; pain or trouble passing urine  signs and symptoms of kidney injury like trouble passing urine or change in the amount of urine  signs and symptoms of low red blood cells or anemia such as unusually weak or tired; feeling faint or lightheaded; falls; breathing problems Side effects that usually do not require medical attention (report to your doctor or health care professional if they continue or are bothersome):  loss of appetite  mouth sores  muscle cramps This list may not  describe all possible side effects. Call your doctor for medical advice about side effects. You may report side effects to FDA at 1-800-FDA-1088. Where should I keep my medicine? This drug is given in a hospital or clinic and will not be stored at home. NOTE: This sheet is a summary. It may not cover all possible information. If you have questions about this medicine, talk to your doctor, pharmacist, or health care provider.  2021 Elsevier/Gold Standard (2018-09-05 15:59:17)

## 2021-01-05 ENCOUNTER — Telehealth: Payer: Self-pay

## 2021-01-05 NOTE — Telephone Encounter (Signed)
Attempted to contact patient and patient's son Gary [name] to schedule a Palliative Care consult appointment. No answer left a message to return call.

## 2021-01-09 ENCOUNTER — Other Ambulatory Visit: Payer: Self-pay

## 2021-01-09 ENCOUNTER — Inpatient Hospital Stay: Payer: Medicare Other

## 2021-01-09 ENCOUNTER — Encounter: Payer: Self-pay | Admitting: Oncology

## 2021-01-09 ENCOUNTER — Other Ambulatory Visit: Payer: Self-pay | Admitting: Nurse Practitioner

## 2021-01-09 DIAGNOSIS — C240 Malignant neoplasm of extrahepatic bile duct: Secondary | ICD-10-CM

## 2021-01-09 LAB — CBC AND DIFFERENTIAL
ALC: 0.68 (ref 0.65–4.75)
ANC (CHCC manual diff): 0.36 — ABNORMAL LOW (ref 1.7–8.2)
Anion Gap: 11 (ref 8–16)
HCT: 30 — AB (ref 41–53)
Hemoglobin: 10.1 — AB (ref 13.5–17.5)
MCH: 31.4 (ref 27–32)
MCHC: 33.4 (ref 33–36)
MCV: 94 (ref 80–94)
Magnesium: 1.6 (ref 1.6–2.3)
Neutrophils Absolute: 0.36 — AB (ref 1.70–8.20)
Osmolality: 279 (ref 270–290)
Platelets: 315 (ref 150–399)
WBC: 1.2 — AB (ref 3.8–10.8)

## 2021-01-09 LAB — BASIC METABOLIC PANEL
BUN: 33 — AB (ref 4–21)
CO2: 30 — AB (ref 13–22)
Chloride: 103 (ref 99–108)
Creatinine: 1.6 — AB (ref 0.6–1.3)
Glucose: 167 — AB (ref 70–99)
Potassium: 4.8 (ref 3.4–5.3)
Sodium: 139 (ref 137–147)

## 2021-01-09 LAB — COMPREHENSIVE METABOLIC PANEL
Albumin: 4.2 (ref 3.5–5.0)
Calcium: 8.7 (ref 8.7–10.7)
GFR calc non Af Amer: 42 — AB (ref 59–?)

## 2021-01-09 LAB — HEPATIC FUNCTION PANEL
ALT: 18 (ref 10–40)
AST: 26 (ref 14–40)
Alkaline Phosphatase: 55 (ref 25–125)
Bilirubin, Total: 0.5 (ref 0.2–1.3)

## 2021-01-09 LAB — CBC: RBC: 3.22 — AB (ref 3.87–5.11)

## 2021-01-09 MED FILL — Dexamethasone Sodium Phosphate Inj 100 MG/10ML: INTRAMUSCULAR | Qty: 1 | Status: AC

## 2021-01-10 ENCOUNTER — Inpatient Hospital Stay: Payer: Medicare Other

## 2021-01-10 ENCOUNTER — Other Ambulatory Visit: Payer: Self-pay

## 2021-01-10 ENCOUNTER — Telehealth: Payer: Self-pay

## 2021-01-10 VITALS — BP 129/68 | HR 76 | Temp 98.0°F | Resp 18 | Ht 74.0 in | Wt 286.0 lb

## 2021-01-10 DIAGNOSIS — Z5111 Encounter for antineoplastic chemotherapy: Secondary | ICD-10-CM | POA: Diagnosis not present

## 2021-01-10 DIAGNOSIS — D6481 Anemia due to antineoplastic chemotherapy: Secondary | ICD-10-CM

## 2021-01-10 DIAGNOSIS — T451X5A Adverse effect of antineoplastic and immunosuppressive drugs, initial encounter: Secondary | ICD-10-CM

## 2021-01-10 MED ORDER — SODIUM CHLORIDE 0.9 % IV SOLN
INTRAVENOUS | Status: DC
  Filled 2021-01-10 (×2): qty 250

## 2021-01-10 MED ORDER — SODIUM CHLORIDE 0.9 % IV SOLN
Freq: Once | INTRAVENOUS | Status: AC
  Filled 2021-01-10: qty 250

## 2021-01-10 NOTE — Patient Instructions (Signed)
Dehydration, Adult Dehydration is condition in which there is not enough water or other fluids in the body. This happens when a person loses more fluids than he or she takes in. Important body parts cannot work right without the right amount of fluids. Any loss of fluids from the body can cause dehydration. Dehydration can be mild, worse, or very bad. It should be treated right away to keep it from getting very bad. What are the causes? This condition may be caused by:  Conditions that cause loss of water or other fluids, such as: ? Watery poop (diarrhea). ? Vomiting. ? Sweating a lot. ? Peeing (urinating) a lot.  Not drinking enough fluids, especially when you: ? Are ill. ? Are doing things that take a lot of energy to do.  Other illnesses and conditions, such as fever or infection.  Certain medicines, such as medicines that take extra fluid out of the body (diuretics).  Lack of safe drinking water.  Not being able to get enough water and food. What increases the risk? The following factors may make you more likely to develop this condition:  Having a long-term (chronic) illness that has not been treated the right way, such as: ? Diabetes. ? Heart disease. ? Kidney disease.  Being 65 years of age or older.  Having a disability.  Living in a place that is high above the ground or sea (high in altitude). The thinner, dried air causes more fluid loss.  Doing exercises that put stress on your body for a long time. What are the signs or symptoms? Symptoms of dehydration depend on how bad it is. Mild or worse dehydration  Thirst.  Dry lips or dry mouth.  Feeling dizzy or light-headed, especially when you stand up from sitting.  Muscle cramps.  Your body making: ? Dark pee (urine). Pee may be the color of tea. ? Less pee than normal. ? Less tears than normal.  Headache. Very bad dehydration  Changes in skin. Skin may: ? Be cold to the touch (clammy). ? Be blotchy  or pale. ? Not go back to normal right after you lightly pinch it and let it go.  Little or no tears, pee, or sweat.  Changes in vital signs, such as: ? Fast breathing. ? Low blood pressure. ? Weak pulse. ? Pulse that is more than 100 beats a minute when you are sitting still.  Other changes, such as: ? Feeling very thirsty. ? Eyes that look hollow (sunken). ? Cold hands and feet. ? Being mixed up (confused). ? Being very tired (lethargic) or having trouble waking from sleep. ? Short-term weight loss. ? Loss of consciousness. How is this treated? Treatment for this condition depends on how bad it is. Treatment should start right away. Do not wait until your condition gets very bad. Very bad dehydration is an emergency. You will need to go to a hospital.  Mild or worse dehydration can be treated at home. You may be asked to: ? Drink more fluids. ? Drink an oral rehydration solution (ORS). This drink helps get the right amounts of fluids and salts and minerals in the blood (electrolytes).  Very bad dehydration can be treated: ? With fluids through an IV tube. ? By getting normal levels of salts and minerals in your blood. This is often done by giving salts and minerals through a tube. The tube is passed through your nose and into your stomach. ? By treating the root cause. Follow these instructions at   home: Oral rehydration solution If told by your doctor, drink an ORS:  Make an ORS. Use instructions on the package.  Start by drinking small amounts, about  cup (120 mL) every 5-10 minutes.  Slowly drink more until you have had the amount that your doctor said to have. Eating and drinking  Drink enough clear fluid to keep your pee pale yellow. If you were told to drink an ORS, finish the ORS first. Then, start slowly drinking other clear fluids. Drink fluids such as: ? Water. Do not drink only water. Doing that can make the salt (sodium) level in your body get too low. ? Water  from ice chips you suck on. ? Fruit juice that you have added water to (diluted). ? Low-calorie sports drinks.  Eat foods that have the right amounts of salts and minerals, such as: ? Bananas. ? Oranges. ? Potatoes. ? Tomatoes. ? Spinach.  Do not drink alcohol.  Avoid: ? Drinks that have a lot of sugar. These include:  High-calorie sports drinks.  Fruit juice that you did not add water to.  Soda.  Caffeine. ? Foods that are greasy or have a lot of fat or sugar.         General instructions  Take over-the-counter and prescription medicines only as told by your doctor.  Do not take salt tablets. Doing that can make the salt level in your body get too high.  Return to your normal activities as told by your doctor. Ask your doctor what activities are safe for you.  Keep all follow-up visits as told by your doctor. This is important. Contact a doctor if:  You have pain in your belly (abdomen) and the pain: ? Gets worse. ? Stays in one place.  You have a rash.  You have a stiff neck.  You get angry or annoyed (irritable) more easily than normal.  You are more tired or have a harder time waking than normal.  You feel: ? Weak or dizzy. ? Very thirsty. Get help right away if you have:  Any symptoms of very bad dehydration.  Symptoms of vomiting, such as: ? You cannot eat or drink without vomiting. ? Your vomiting gets worse or does not go away. ? Your vomit has blood or green stuff in it.  Symptoms that get worse with treatment.  A fever.  A very bad headache.  Problems with peeing or pooping (having a bowel movement), such as: ? Watery poop that gets worse or does not go away. ? Blood in your poop (stool). This may cause poop to look black and tarry. ? Not peeing in 6-8 hours. ? Peeing only a small amount of very dark pee in 6-8 hours.  Trouble breathing. These symptoms may be an emergency. Do not wait to see if the symptoms will go away. Get  medical help right away. Call your local emergency services (911 in the [place] Do not drive yourself to the hospital. Summary  Dehydration is a condition in which there is not enough water or other fluids in the body. This happens when a person loses more fluids than he or she takes in.  Treatment for this condition depends on how bad it is. Treatment should be started right away. Do not wait until your condition gets very bad.  Drink enough clear fluid to keep your pee pale yellow. If you were told to drink an oral rehydration solution (ORS), finish the ORS first. Then, start slowly drinking other clear fluids.    Take over-the-counter and prescription medicines only as told by your doctor.  Get help right away if you have any symptoms of very bad dehydration. This information is not intended to replace advice given to you by your health care provider. Make sure you discuss any questions you have with your health care provider. Document Revised: 04/23/2019 Document Reviewed: 04/23/2019 Elsevier Patient Education  2021 [employer]  

## 2021-01-10 NOTE — Telephone Encounter (Signed)
Several attempts to contact patient and patient's son Gary [name] to schedule a Palliative Care consult appointment. No answer left a message to return call by Wednesday if possible. If no return call by Wednesday will cancel referral and notify PCP.

## 2021-01-11 ENCOUNTER — Telehealth: Payer: Self-pay

## 2021-01-11 NOTE — Telephone Encounter (Signed)
Attempted to contact patient several times to schedule Palliative consult appointment. No response back to schedule. Will cancel referral and notify PCP.

## 2021-01-15 ENCOUNTER — Other Ambulatory Visit: Payer: Self-pay | Admitting: Cardiology

## 2021-01-19 ENCOUNTER — Inpatient Hospital Stay: Payer: Medicare Other

## 2021-01-19 ENCOUNTER — Other Ambulatory Visit: Payer: Self-pay | Admitting: Hematology and Oncology

## 2021-01-19 DIAGNOSIS — C240 Malignant neoplasm of extrahepatic bile duct: Secondary | ICD-10-CM

## 2021-01-19 LAB — BASIC METABOLIC PANEL
BUN: 24 — AB (ref 4–21)
CO2: 27 — AB (ref 13–22)
Chloride: 106 (ref 99–108)
Creatinine: 1.4 — AB (ref 0.6–1.3)
Glucose: 134
Potassium: 4.2 (ref 3.4–5.3)
Sodium: 142 (ref 137–147)

## 2021-01-19 LAB — CBC
MCV: 95 — AB (ref 80–94)
RBC: 3.34 — AB (ref 3.87–5.11)

## 2021-01-19 LAB — MAGNESIUM: Magnesium: 1.6 (ref 1.6–2.3)

## 2021-01-19 LAB — HEPATIC FUNCTION PANEL
ALT: 16 (ref 10–40)
AST: 24 (ref 14–40)
Alkaline Phosphatase: 52 (ref 25–125)
Bilirubin, Total: 0.6

## 2021-01-19 LAB — COMPREHENSIVE METABOLIC PANEL
Albumin: 4 (ref 3.5–5.0)
Calcium: 8.9 (ref 8.7–10.7)

## 2021-01-19 LAB — CBC AND DIFFERENTIAL
HCT: 32 — AB (ref 41–53)
Hemoglobin: 10.4 — AB (ref 13.5–17.5)
Neutrophils Absolute: 5.18
Platelets: 126 — AB (ref 150–399)
WBC: 7.1

## 2021-01-22 NOTE — Progress Notes (Signed)
Gary [name]  [address] [place],  [place]  [address] [phone]  Clinic Day: 5/2//2022  Referring physician: Bonnita Nasuti, MD   HISTORY OF PRESENT ILLNESS:  The patient is a 80 y.o. 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  He comes in today to go over his abdominal MRI to ascertain his new disease baseline after receiving 4 cycles of cisplatin/gemcitabine.  The patient claims to have tolerated his last cycle of treatment fairly well.  Of note, day 8 of his 4th cycle of chemotherapy had to held due to low counts.  He denies having abdominal pain or other GI symptoms which concern him for disease progression.    PHYSICAL EXAM:  Blood pressure 123/68, pulse 85, temperature 98.6 F (37 C), resp. rate 18, height $RemoveBe'6\' 2"'[id number]$  (1.88 m), weight 288 lb 11.2 oz (131 kg), SpO2 93 %. Wt Readings from Last 3 Encounters:  01/23/21 288 lb 11.2 oz (131 kg)  01/10/21 286 lb (129.7 kg)  01/03/21 297 lb (134.7 kg)   Body mass index is 37.07 kg/m. Performance status (ECOG): 1 - Symptomatic but completely ambulatory Physical Exam Constitutional:      Appearance: Normal appearance. He is not ill-appearing.  HENT:     Mouth/Throat:     Mouth: Mucous membranes are moist.     Pharynx: Oropharynx is clear. No oropharyngeal exudate or posterior oropharyngeal erythema.  Cardiovascular:     Rate and Rhythm: Normal rate and regular rhythm.     Heart sounds: No murmur heard. No friction rub. No gallop.   Pulmonary:     Effort: Pulmonary effort is normal. No respiratory distress.     Breath sounds: Normal breath sounds. No wheezing, rhonchi or rales.  Chest:  Breasts:     Right: No axillary adenopathy or supraclavicular adenopathy.     Left: No axillary adenopathy or supraclavicular adenopathy.    Abdominal:     General: Bowel sounds are normal.  There is no distension.     Palpations: Abdomen is soft. There is no mass (no palpable abdominal passes are appreciated).     Tenderness: There is no abdominal tenderness.  Musculoskeletal:        General: No swelling.     Right lower leg: No edema.     Left lower leg: No edema.  Lymphadenopathy:     Cervical: No cervical adenopathy.     Upper Body:     Right upper body: No supraclavicular or axillary adenopathy.     Left upper body: No supraclavicular or axillary adenopathy.     Lower Body: No right inguinal adenopathy. No left inguinal adenopathy.  Skin:    General: Skin is warm.     Coloration: Skin is not jaundiced.     Findings: No lesion or rash.  Neurological:     General: No focal deficit present.     Mental Status: He is alert and oriented to person, place, and time. Mental status is at baseline.     Cranial Nerves: Cranial nerves are intact.  Psychiatric:        Mood and Affect: Mood normal.        Thought Content: Thought content normal.   SCANS:   His abdominal MRI revealed the following: FINDINGS: Lower chest: Visualized lungs are clear.  Hepatobiliary: Status post partial left hepatectomy. Dynamic postcontrast imaging is motion degraded, but there is an aggregate  enhancing lesion measuring approximately 5.5 x 2.9 cm in segment 4B (series 901/image 54), also evident on diffusion imaging (series 6/image 16). Additional discrete lesions are present in segment 4A (series 6/image 11), segment 7 (series 6/image 7), and segment 6 (series 6/image 13). Overall, these findings are likely grossly unchanged, noting that the segment 4B lesion is difficult to compare across studies.  Status post cholecystectomy. No intrahepatic or extrahepatic ductal dilatation.  Pancreas:  Within normal limits.  Spleen:  Within normal limits.  Adrenals/Urinary Tract:  Adrenal glands are within normal limits.  Subcentimeter cyst in the posterior right upper kidney (series 7/image 23).  Additional cysts in the left lower kidney measuring up to 12 mm (series 7/image 33), benign (Bosniak I). No hydronephrosis.  Stomach/Bowel: Stomach and visualized bowel are unremarkable.  Vascular/Lymphatic:  No evidence of abdominal aortic aneurysm.  No suspicious abdominal lymphadenopathy.  Other: 16 x 11 mm peritoneal implant beneath the right paramidline anterior abdominal wall (series 904/image 50), unchanged.  Musculoskeletal: No focal osseous lesions.  IMPRESSION: Status post partial left hepatectomy.  Stable hepatic metastases, including a prominent aggregate lesion segment 4B, overall grossly unchanged.  Stable peritoneal lesion beneath the right paramidline anterior abdominal wall.  LABS:  Results for MARRIS, FRONTERA (MRN [id number]) as of 01/23/2021 13:18  Ref. Range 01/19/2021 00:00  Sodium Latest Ref Range: 137 - 147  142  Potassium Latest Ref Range: 3.4 - 5.3  4.2  Chloride Latest Ref Range: 99 - 108  106  CO2 Latest Ref Range: 13 - 22  27 (A)  Glucose Unknown 134  BUN Latest Ref Range: 4 - 21  24 (A)  Creatinine Latest Ref Range: 0.6 - 1.3  1.4 (A)  Calcium Latest Ref Range: 8.7 - 10.7  8.9  Magnesium Latest Ref Range: 1.6 - 2.3  1.6  Alkaline Phosphatase Latest Ref Range: 25 - 125  52  Albumin Latest Ref Range: 3.5 - 5.0  4.0  AST Latest Ref Range: 14 - 40  24  ALT Latest Ref Range: 10 - 40  16  Bilirubin, Total Unknown 0.6  WBC Unknown 7.1  RBC Latest Ref Range: 3.87 - 5.11  3.34 (A)  Hemoglobin Latest Ref Range: 13.5 - 17.5  10.4 (A)  HCT Latest Ref Range: 41 - 53  32 (A)  MCV Latest Ref Range: 80 - 94  95 (A)  Platelets Latest Ref Range: 150 - 399  126 (A)  NEUT# Unknown 5.18    ASSESSMENT & PLAN:  Assessment/Plan:  A 80 y.o. male with metastatic FGFR2 mutation positive cholangiocarcinoma.  In clinic today, I went over his abdominal MRI images with him, for which he could see his disease has not progressed after 4 cycles of treatment.  He will  proceed with his 5th cycle of cisplatin/gemcitiabine this week.  I will see this patient back in 3 weeks before he heads in to his 6th cycle of cisplatin/gemcitiabine.  The patient understands all the plans discussed today and is in agreement with them.    Kyal Arts Macarthur Critchley, MD

## 2021-01-23 ENCOUNTER — Inpatient Hospital Stay: Payer: Medicare Other | Attending: Oncology | Admitting: Oncology

## 2021-01-23 ENCOUNTER — Other Ambulatory Visit: Payer: Self-pay | Admitting: Oncology

## 2021-01-23 ENCOUNTER — Other Ambulatory Visit: Payer: Self-pay

## 2021-01-23 VITALS — BP 123/68 | HR 85 | Temp 98.6°F | Resp 18 | Ht 74.0 in | Wt 288.7 lb

## 2021-01-23 DIAGNOSIS — Z79899 Other long term (current) drug therapy: Secondary | ICD-10-CM | POA: Insufficient documentation

## 2021-01-23 DIAGNOSIS — C240 Malignant neoplasm of extrahepatic bile duct: Secondary | ICD-10-CM

## 2021-01-23 DIAGNOSIS — T451X5A Adverse effect of antineoplastic and immunosuppressive drugs, initial encounter: Secondary | ICD-10-CM | POA: Insufficient documentation

## 2021-01-23 DIAGNOSIS — I255 Ischemic cardiomyopathy: Secondary | ICD-10-CM | POA: Diagnosis not present

## 2021-01-23 DIAGNOSIS — D6481 Anemia due to antineoplastic chemotherapy: Secondary | ICD-10-CM | POA: Insufficient documentation

## 2021-01-23 DIAGNOSIS — Z5111 Encounter for antineoplastic chemotherapy: Secondary | ICD-10-CM | POA: Insufficient documentation

## 2021-01-23 DIAGNOSIS — C786 Secondary malignant neoplasm of retroperitoneum and peritoneum: Secondary | ICD-10-CM | POA: Insufficient documentation

## 2021-01-23 DIAGNOSIS — C220 Liver cell carcinoma: Secondary | ICD-10-CM | POA: Insufficient documentation

## 2021-01-24 ENCOUNTER — Inpatient Hospital Stay: Payer: Medicare Other

## 2021-01-24 VITALS — BP 143/71 | HR 77 | Temp 98.1°F | Resp 18 | Ht 74.0 in | Wt 291.0 lb

## 2021-01-24 DIAGNOSIS — Z5111 Encounter for antineoplastic chemotherapy: Secondary | ICD-10-CM | POA: Diagnosis not present

## 2021-01-24 DIAGNOSIS — C786 Secondary malignant neoplasm of retroperitoneum and peritoneum: Secondary | ICD-10-CM | POA: Diagnosis not present

## 2021-01-24 DIAGNOSIS — D6481 Anemia due to antineoplastic chemotherapy: Secondary | ICD-10-CM | POA: Diagnosis present

## 2021-01-24 DIAGNOSIS — T451X5A Adverse effect of antineoplastic and immunosuppressive drugs, initial encounter: Secondary | ICD-10-CM | POA: Diagnosis not present

## 2021-01-24 DIAGNOSIS — Z79899 Other long term (current) drug therapy: Secondary | ICD-10-CM | POA: Diagnosis not present

## 2021-01-24 DIAGNOSIS — C240 Malignant neoplasm of extrahepatic bile duct: Secondary | ICD-10-CM

## 2021-01-24 DIAGNOSIS — C220 Liver cell carcinoma: Secondary | ICD-10-CM | POA: Diagnosis present

## 2021-01-24 MED ORDER — SODIUM CHLORIDE 0.9 % IV SOLN
Freq: Once | INTRAVENOUS | Status: AC
  Filled 2021-01-24: qty 250

## 2021-01-24 MED ORDER — FUROSEMIDE 10 MG/ML IJ SOLN
20.0000 mg | Freq: Once | INTRAMUSCULAR | Status: AC
  Administered 2021-01-24: 20 mg via INTRAVENOUS

## 2021-01-24 MED ORDER — PALONOSETRON HCL INJECTION 0.25 MG/5ML
0.2500 mg | Freq: Once | INTRAVENOUS | Status: AC
  Administered 2021-01-24: 0.25 mg via INTRAVENOUS

## 2021-01-24 MED ORDER — PALONOSETRON HCL INJECTION 0.25 MG/5ML
INTRAVENOUS | Status: AC
  Filled 2021-01-24: qty 5

## 2021-01-24 MED ORDER — MAGNESIUM SULFATE 2 GM/50ML IV SOLN
INTRAVENOUS | Status: AC
  Filled 2021-01-24: qty 50

## 2021-01-24 MED ORDER — SODIUM CHLORIDE 0.9 % IV SOLN
750.0000 mg/m2 | Freq: Once | INTRAVENOUS | Status: AC
  Administered 2021-01-24: 2014 mg via INTRAVENOUS
  Filled 2021-01-24: qty 52.97

## 2021-01-24 MED ORDER — MAGNESIUM SULFATE 2 GM/50ML IV SOLN
2.0000 g | Freq: Once | INTRAVENOUS | Status: AC
  Administered 2021-01-24: 2 g via INTRAVENOUS

## 2021-01-24 MED ORDER — POTASSIUM CHLORIDE IN NACL 20-0.9 MEQ/L-% IV SOLN
INTRAVENOUS | Status: AC
  Filled 2021-01-24: qty 1000

## 2021-01-24 MED ORDER — SODIUM CHLORIDE 0.9 % IV SOLN
18.7500 mg/m2 | Freq: Once | INTRAVENOUS | Status: AC
  Administered 2021-01-24: 50 mg via INTRAVENOUS
  Filled 2021-01-24: qty 50

## 2021-01-24 MED ORDER — POTASSIUM CHLORIDE IN NACL 20-0.9 MEQ/L-% IV SOLN: Freq: Once | INTRAVENOUS | Status: AC

## 2021-01-24 MED ORDER — SODIUM CHLORIDE 0.9 % IV SOLN
150.0000 mg | Freq: Once | INTRAVENOUS | Status: AC
  Administered 2021-01-24: 150 mg via INTRAVENOUS
  Filled 2021-01-24: qty 150

## 2021-01-24 MED ORDER — SODIUM CHLORIDE 0.9 % IV SOLN
Freq: Once | INTRAVENOUS | Status: DC
  Filled 2021-01-24: qty 250

## 2021-01-24 MED ORDER — SODIUM CHLORIDE 0.9% FLUSH
10.0000 mL | INTRAVENOUS | Status: DC | PRN
  Administered 2021-01-24: 10 mL
  Filled 2021-01-24: qty 10

## 2021-01-24 MED ORDER — DEXAMETHASONE SODIUM PHOSPHATE 100 MG/10ML IJ SOLN
10.0000 mg | Freq: Once | INTRAMUSCULAR | Status: AC
  Administered 2021-01-24: 10 mg via INTRAVENOUS
  Filled 2021-01-24: qty 1

## 2021-01-24 MED ORDER — FUROSEMIDE 10 MG/ML IJ SOLN
INTRAMUSCULAR | Status: AC
  Filled 2021-01-24: qty 2

## 2021-01-24 NOTE — Progress Notes (Signed)
Pt d/c stable at 1543

## 2021-01-24 NOTE — Patient Instructions (Signed)
[facility]  Discharge Instructions: Thank you for choosing [facility] to provide your oncology and hematology care.  If you have a lab appointment with the [facility], please go directly to the [facility] and check in at the registration area.   Wear comfortable clothing and clothing appropriate for easy access to any Portacath or PICC line.   We strive to give you quality time with your provider. You may need to reschedule your appointment if you arrive late (15 or more minutes).  Arriving late affects you and other patients whose appointments are after yours.  Also, if you miss three or more appointments without notifying the office, you may be dismissed from the clinic at the provider's discretion.      For prescription refill requests, have your pharmacy contact our office and allow 72 hours for refills to be completed.    Today you received the following chemotherapy and/or immunotherapy agents cisplatin/gemzar     To help prevent nausea and vomiting after your treatment, we encourage you to take your nausea medication as directed.  BELOW ARE SYMPTOMS THAT SHOULD BE REPORTED IMMEDIATELY: . *FEVER GREATER THAN 100.4 F (38 C) OR HIGHER . *CHILLS OR SWEATING . *NAUSEA AND VOMITING THAT IS NOT CONTROLLED WITH YOUR NAUSEA MEDICATION . *UNUSUAL SHORTNESS OF BREATH . *UNUSUAL BRUISING OR BLEEDING . *URINARY PROBLEMS (pain or burning when urinating, or frequent urination) . *BOWEL PROBLEMS (unusual diarrhea, constipation, pain near the anus) . TENDERNESS IN MOUTH AND THROAT WITH OR WITHOUT PRESENCE OF ULCERS (sore throat, sores in mouth, or a toothache) . UNUSUAL RASH, SWELLING OR PAIN  . UNUSUAL VAGINAL DISCHARGE OR ITCHING   Items with * indicate a potential emergency and should be followed up as soon as possible or go to the Emergency Department if any problems should occur.  Please show the CHEMOTHERAPY ALERT CARD or IMMUNOTHERAPY ALERT  CARD at check-in to the Emergency Department and triage nurse.  Should you have questions after your visit or need to cancel or reschedule your appointment, please contact [facility]  Dept: [phone]  and follow the prompts.  Office hours are 8:00 a.m. to 4:30 p.m. Monday - Friday. Please note that voicemails left after 4:00 p.m. may not be returned until the following business day.  We are closed weekends and major holidays. You have access to a nurse at all times for urgent questions. Please call the main number to the clinic Dept: [phone] and follow the prompts.  For any non-urgent questions, you may also contact your provider using MyChart. We now offer e-Visits for anyone 71 and older to request care online for non-urgent symptoms. For details visit [email or web address].   Also download the MyChart app! Go to the app store, search "MyChart", open the app, select [facility], and log in with your MyChart username and password.  Due to Covid, a mask is required upon entering the hospital/clinic. If you do not have a mask, one will be given to you upon arrival. For doctor visits, patients may have 1 support person aged 76 or older with them. For treatment visits, patients cannot have anyone with them due to current Covid guidelines and our immunocompromised population.   Hypokalemia Hypokalemia means that the amount of potassium in the blood is lower than normal. Potassium is a chemical (electrolyte) that helps regulate the amount of fluid in the body. It also stimulates muscle tightening (contraction) and helps nerves work properly. Normally,  most of the body's potassium is inside cells, and only a very small amount is in the blood. Because the amount in the blood is so small, minor changes to potassium levels in the blood can be life-threatening. What are the causes? This condition may be caused by:  Antibiotic medicine.  Diarrhea or vomiting. Taking too  much of a medicine that helps you have a bowel movement (laxative) can cause diarrhea and lead to hypokalemia.  Chronic kidney disease (CKD).  Medicines that help the body get rid of excess fluid (diuretics).  Eating disorders, such as bulimia.  Low magnesium levels in the body.  Sweating a lot. What are the signs or symptoms? Symptoms of this condition include:  Weakness.  Constipation.  Fatigue.  Muscle cramps.  Mental confusion.  Skipped heartbeats or irregular heartbeat (palpitations).  Tingling or numbness. How is this diagnosed? This condition is diagnosed with a blood test. How is this treated? This condition may be treated by:  Taking potassium supplements by mouth.  Adjusting the medicines that you take.  Eating more foods that contain a lot of potassium. If your potassium level is very low, you may need to get potassium through an IV and be monitored in the hospital. Follow these instructions at home:  Take over-the-counter and prescription medicines only as told by your health care provider. This includes vitamins and supplements.  Eat a healthy diet. A healthy diet includes fresh fruits and vegetables, whole grains, healthy fats, and lean proteins.  If instructed, eat more foods that contain a lot of potassium. This includes: ? Nuts, such as peanuts and pistachios. ? Seeds, such as sunflower seeds and pumpkin seeds. ? Peas, lentils, and lima beans. ? Whole grain and bran cereals and breads. ? Fresh fruits and vegetables, such as apricots, avocado, bananas, cantaloupe, kiwi, oranges, tomatoes, asparagus, and potatoes. ? Orange juice. ? Tomato juice. ? Red meats. ? Yogurt.  Keep all follow-up visits as told by your health care provider. This is important.   Contact a health care provider if you:  Have weakness that gets worse.  Feel your heart pounding or racing.  Vomit.  Have diarrhea.  Have diabetes (diabetes mellitus) and you have trouble  keeping your blood sugar (glucose) in your target range. Get help right away if you:  Have chest pain.  Have shortness of breath.  Have vomiting or diarrhea that lasts for more than 2 days.  Faint. Summary  Hypokalemia means that the amount of potassium in the blood is lower than normal.  This condition is diagnosed with a blood test.  Hypokalemia may be treated by taking potassium supplements, adjusting the medicines that you take, or eating more foods that are high in potassium.  If your potassium level is very low, you may need to get potassium through an IV and be monitored in the hospital. This information is not intended to replace advice given to you by your health care provider. Make sure you discuss any questions you have with your health care provider. Document Revised: 04/23/2018 Document Reviewed: 04/23/2018 Elsevier Patient Education  2021 [place]. Hypomagnesemia Hypomagnesemia is a condition in which the level of magnesium in the blood is low. Magnesium is a mineral that is found in many foods. It is used in many different processes in the body. Hypomagnesemia can affect every organ in the body. In severe cases, it can cause life-threatening problems. What are the causes? This condition may be caused by:  Not getting enough magnesium in  your diet.  Malnutrition.  Problems with absorbing magnesium from the intestines.  Dehydration.  Alcohol abuse.  Vomiting.  Severe or chronic diarrhea.  Some medicines, including medicines that make you urinate more (diuretics).  Certain diseases, such as kidney disease, diabetes, celiac disease, and overactive thyroid. What are the signs or symptoms? Symptoms of this condition include:  Loss of appetite.  Nausea and vomiting.  Involuntary shaking or trembling of a body part (tremor).  Muscle weakness.  Tingling in the arms and legs.  Sudden tightening of muscles (muscle spasms).  Confusion.  Psychiatric  issues, such as depression, irritability, or psychosis.  A feeling of fluttering of the heart.  Seizures. These symptoms are more severe if magnesium levels drop suddenly. How is this diagnosed? This condition may be diagnosed based on:  Your symptoms and medical history.  A physical exam.  Blood and urine tests. How is this treated? Treatment depends on the cause and the severity of the condition. It may be treated with:  A magnesium supplement. This can be taken in pill form. If the condition is severe, magnesium is usually given through an IV.  Changes to your diet. You may be directed to eat foods that have a lot of magnesium, such as green leafy vegetables, peas, beans, and nuts.  Stopping any intake of alcohol.   Follow these instructions at home:  Make sure that your diet includes foods with magnesium. Foods that have a lot of magnesium in them include: ? Green leafy vegetables, such as spinach and broccoli. ? Beans and peas. ? Nuts and seeds, such as almonds and sunflower seeds. ? Whole grains, such as whole grain bread and fortified cereals.  Take magnesium supplements if your health care provider tells you to do that. Take them as directed.  Take over-the-counter and prescription medicines only as told by your health care provider.  Have your magnesium levels monitored as told by your health care provider.  When you are active, drink fluids that contain electrolytes.  Avoid drinking alcohol.  Keep all follow-up visits as told by your health care provider. This is important.      Contact a health care provider if:  You get worse instead of better.  Your symptoms return. Get help right away if you:  Develop severe muscle weakness.  Have trouble breathing.  Feel that your heart is racing. Summary  Hypomagnesemia is a condition in which the level of magnesium in the blood is low.  Hypomagnesemia can affect every organ in the body.  Treatment may  include eating more foods that contain magnesium, taking magnesium supplements, and not drinking alcohol.  Have your magnesium levels monitored as told by your health care provider. This information is not intended to replace advice given to you by your health care provider. Make sure you discuss any questions you have with your health care provider. Document Revised: 02/11/2020 Document Reviewed: 02/11/2020 Elsevier Patient Education  2021 [place]. Cisplatin injection What is this medicine? CISPLATIN (SIS pla tin) is a chemotherapy drug. It targets fast dividing cells, like cancer cells, and causes these cells to die. This medicine is used to treat many types of cancer like bladder, ovarian, and testicular cancers. This medicine may be used for other purposes; ask your health care provider or pharmacist if you have questions. COMMON BRAND NAME(S): Platinol, Platinol -AQ What should I tell my health care provider before I take this medicine? They need to know if you have any of these conditions:  eye disease, vision problems  hearing problems  kidney disease  low blood counts, like white cells, platelets, or red blood cells  tingling of the fingers or toes, or other nerve disorder  an unusual or allergic reaction to cisplatin, carboplatin, oxaliplatin, other medicines, foods, dyes, or preservatives  pregnant or trying to get pregnant  breast-feeding How should I use this medicine? This drug is given as an infusion into a vein. It is administered in a hospital or clinic by a specially trained health care professional. Talk to your pediatrician regarding the use of this medicine in children. Special care may be needed. Overdosage: If you think you have taken too much of this medicine contact a poison control center or emergency room at once. NOTE: This medicine is only for you. Do not share this medicine with others. What if I miss a dose? It is important not to miss a dose.  Call your doctor or health care professional if you are unable to keep an appointment. What may interact with this medicine? This medicine may interact with the following medications:  foscarnet  certain antibiotics like amikacin, gentamicin, neomycin, polymyxin B, streptomycin, tobramycin, vancomycin This list may not describe all possible interactions. Give your health care provider a list of all the medicines, herbs, non-prescription drugs, or dietary supplements you use. Also tell them if you smoke, drink alcohol, or use illegal drugs. Some items may interact with your medicine. What should I watch for while using this medicine? Your condition will be monitored carefully while you are receiving this medicine. You will need important blood work done while you are taking this medicine. This drug may make you feel generally unwell. This is not uncommon, as chemotherapy can affect healthy cells as well as cancer cells. Report any side effects. Continue your course of treatment even though you feel ill unless your doctor tells you to stop. This medicine may increase your risk of getting an infection. Call your healthcare professional for advice if you get a fever, chills, or sore throat, or other symptoms of a cold or flu. Do not treat yourself. Try to avoid being around people who are sick. Avoid taking medicines that contain aspirin, acetaminophen, ibuprofen, naproxen, or ketoprofen unless instructed by your healthcare professional. These medicines may hide a fever. This medicine may increase your risk to bruise or bleed. Call your doctor or health care professional if you notice any unusual bleeding. Be careful brushing and flossing your teeth or using a toothpick because you may get an infection or bleed more easily. If you have any dental work done, tell your dentist you are receiving this medicine. Do not become pregnant while taking this medicine or for 14 months after stopping it. Women should  inform their healthcare professional if they wish to become pregnant or think they might be pregnant. Men should not father a child while taking this medicine and for 11 months after stopping it. There is potential for serious side effects to an unborn child. Talk to your healthcare professional for more information. Do not breast-feed an infant while taking this medicine. This medicine has caused ovarian failure in some women. This medicine may make it more difficult to get pregnant. Talk to your healthcare professional if you are concerned about your fertility. This medicine has caused decreased sperm counts in some men. This may make it more difficult to father a child. Talk to your healthcare professional if you are concerned about your fertility. Drink fluids as directed while you  are taking this medicine. This will help protect your kidneys. Call your doctor or health care professional if you get diarrhea. Do not treat yourself. What side effects may I notice from receiving this medicine? Side effects that you should report to your doctor or health care professional as soon as possible:  allergic reactions like skin rash, itching or hives, swelling of the face, lips, or tongue  blurred vision  changes in vision  decreased hearing or ringing of the ears  nausea, vomiting  pain, redness, or irritation at site where injected  pain, tingling, numbness in the hands or feet  signs and symptoms of bleeding such as bloody or black, tarry stools; red or dark brown urine; spitting up blood or brown material that looks like coffee grounds; red spots on the skin; unusual bruising or bleeding from the eyes, gums, or nose  signs and symptoms of infection like fever; chills; cough; sore throat; pain or trouble passing urine  signs and symptoms of kidney injury like trouble passing urine or change in the amount of urine  signs and symptoms of low red blood cells or anemia such as unusually weak or  tired; feeling faint or lightheaded; falls; breathing problems Side effects that usually do not require medical attention (report to your doctor or health care professional if they continue or are bothersome):  loss of appetite  mouth sores  muscle cramps This list may not describe all possible side effects. Call your doctor for medical advice about side effects. You may report side effects to FDA at 1-800-FDA-1088. Where should I keep my medicine? This drug is given in a hospital or clinic and will not be stored at home. NOTE: This sheet is a summary. It may not cover all possible information. If you have questions about this medicine, talk to your doctor, pharmacist, or health care provider.  2021 Elsevier/Gold Standard (2018-09-05 15:59:17) Gemcitabine injection What is this medicine? GEMCITABINE (jem SYE ta been) is a chemotherapy drug. This medicine is used to treat many types of cancer like breast cancer, lung cancer, pancreatic cancer, and ovarian cancer. This medicine may be used for other purposes; ask your health care provider or pharmacist if you have questions. COMMON BRAND NAME(S): Gemzar, Infugem What should I tell my health care provider before I take this medicine? They need to know if you have any of these conditions:  blood disorders  infection  kidney disease  liver disease  lung or breathing disease, like asthma  recent or ongoing radiation therapy  an unusual or allergic reaction to gemcitabine, other chemotherapy, other medicines, foods, dyes, or preservatives  pregnant or trying to get pregnant  breast-feeding How should I use this medicine? This drug is given as an infusion into a vein. It is administered in a hospital or clinic by a specially trained health care professional. Talk to your pediatrician regarding the use of this medicine in children. Special care may be needed. Overdosage: If you think you have taken too much of this medicine contact a  poison control center or emergency room at once. NOTE: This medicine is only for you. Do not share this medicine with others. What if I miss a dose? It is important not to miss your dose. Call your doctor or health care professional if you are unable to keep an appointment. What may interact with this medicine?  medicines to increase blood counts like filgrastim, pegfilgrastim, sargramostim  some other chemotherapy drugs like cisplatin  vaccines Talk to your doctor  or health care professional before taking any of these medicines:  acetaminophen  aspirin  ibuprofen  ketoprofen  naproxen This list may not describe all possible interactions. Give your health care provider a list of all the medicines, herbs, non-prescription drugs, or dietary supplements you use. Also tell them if you smoke, drink alcohol, or use illegal drugs. Some items may interact with your medicine. What should I watch for while using this medicine? Visit your doctor for checks on your progress. This drug may make you feel generally unwell. This is not uncommon, as chemotherapy can affect healthy cells as well as cancer cells. Report any side effects. Continue your course of treatment even though you feel ill unless your doctor tells you to stop. In some cases, you may be given additional medicines to help with side effects. Follow all directions for their use. Call your doctor or health care professional for advice if you get a fever, chills or sore throat, or other symptoms of a cold or flu. Do not treat yourself. This drug decreases your body's ability to fight infections. Try to avoid being around people who are sick. This medicine may increase your risk to bruise or bleed. Call your doctor or health care professional if you notice any unusual bleeding. Be careful brushing and flossing your teeth or using a toothpick because you may get an infection or bleed more easily. If you have any dental work done, tell your  dentist you are receiving this medicine. Avoid taking products that contain aspirin, acetaminophen, ibuprofen, naproxen, or ketoprofen unless instructed by your doctor. These medicines may hide a fever. Do not become pregnant while taking this medicine or for 6 months after stopping it. Women should inform their doctor if they wish to become pregnant or think they might be pregnant. Men should not father a child while taking this medicine and for 3 months after stopping it. There is a potential for serious side effects to an unborn child. Talk to your health care professional or pharmacist for more information. Do not breast-feed an infant while taking this medicine or for at least 1 week after stopping it. Men should inform their doctors if they wish to father a child. This medicine may lower sperm counts. Talk with your doctor or health care professional if you are concerned about your fertility. What side effects may I notice from receiving this medicine? Side effects that you should report to your doctor or health care professional as soon as possible:  allergic reactions like skin rash, itching or hives, swelling of the face, lips, or tongue  breathing problems  pain, redness, or irritation at site where injected  signs and symptoms of a dangerous change in heartbeat or heart rhythm like chest pain; dizziness; fast or irregular heartbeat; palpitations; feeling faint or lightheaded, falls; breathing problems  signs of decreased platelets or bleeding - bruising, pinpoint red spots on the skin, black, tarry stools, blood in the urine  signs of decreased red blood cells - unusually weak or tired, feeling faint or lightheaded, falls  signs of infection - fever or chills, cough, sore throat, pain or difficulty passing urine  signs and symptoms of kidney injury like trouble passing urine or change in the amount of urine  signs and symptoms of liver injury like dark yellow or brown urine; general  ill feeling or flu-like symptoms; light-colored stools; loss of appetite; nausea; right upper belly pain; unusually weak or tired; yellowing of the eyes or skin  swelling of  ankles, feet, hands Side effects that usually do not require medical attention (report to your doctor or health care professional if they continue or are bothersome):  constipation  diarrhea  hair loss  loss of appetite  nausea  rash  vomiting This list may not describe all possible side effects. Call your doctor for medical advice about side effects. You may report side effects to FDA at 1-800-FDA-1088. Where should I keep my medicine? This drug is given in a hospital or clinic and will not be stored at home. NOTE: This sheet is a summary. It may not cover all possible information. If you have questions about this medicine, talk to your doctor, pharmacist, or health care provider.  2021 Elsevier/Gold Standard (2017-12-04 18:06:11)

## 2021-01-27 ENCOUNTER — Encounter: Payer: Self-pay | Admitting: Oncology

## 2021-01-30 ENCOUNTER — Other Ambulatory Visit: Payer: Self-pay

## 2021-01-30 ENCOUNTER — Inpatient Hospital Stay: Payer: Medicare Other

## 2021-01-30 ENCOUNTER — Other Ambulatory Visit: Payer: Self-pay | Admitting: Hematology and Oncology

## 2021-01-30 DIAGNOSIS — C240 Malignant neoplasm of extrahepatic bile duct: Secondary | ICD-10-CM

## 2021-01-30 LAB — COMPREHENSIVE METABOLIC PANEL
Albumin: 4.2 (ref 3.5–5.0)
Calcium: 9.2 (ref 8.7–10.7)

## 2021-01-30 LAB — BASIC METABOLIC PANEL
BUN: 21 (ref 4–21)
CO2: 29 — AB (ref 13–22)
Chloride: 102 (ref 99–108)
Creatinine: 1.1 (ref 0.6–1.3)
Glucose: 135
Potassium: 4.7 (ref 3.4–5.3)
Sodium: 140 (ref 137–147)

## 2021-01-30 LAB — CBC AND DIFFERENTIAL
HCT: 31 — AB (ref 41–53)
Hemoglobin: 10.4 — AB (ref 13.5–17.5)
Neutrophils Absolute: 2.94
Platelets: 104 — AB (ref 150–399)
WBC: 4.2

## 2021-01-30 LAB — CBC: RBC: 3.33 — AB (ref 3.87–5.11)

## 2021-01-30 LAB — HEPATIC FUNCTION PANEL
ALT: 21 (ref 10–40)
AST: 26 (ref 14–40)
Alkaline Phosphatase: 62 (ref 25–125)
Bilirubin, Total: 0.9

## 2021-01-30 LAB — MAGNESIUM: Magnesium: 1.4

## 2021-01-31 ENCOUNTER — Inpatient Hospital Stay: Payer: Medicare Other

## 2021-01-31 DIAGNOSIS — C240 Malignant neoplasm of extrahepatic bile duct: Secondary | ICD-10-CM

## 2021-01-31 DIAGNOSIS — Z5111 Encounter for antineoplastic chemotherapy: Secondary | ICD-10-CM | POA: Diagnosis not present

## 2021-01-31 MED ORDER — POTASSIUM CHLORIDE IN NACL 20-0.9 MEQ/L-% IV SOLN: Freq: Once | INTRAVENOUS | Status: AC

## 2021-01-31 MED ORDER — SODIUM CHLORIDE 0.9 % IV SOLN
150.0000 mg | Freq: Once | INTRAVENOUS | Status: AC
  Administered 2021-01-31: 150 mg via INTRAVENOUS
  Filled 2021-01-31: qty 150

## 2021-01-31 MED ORDER — SODIUM CHLORIDE 0.9 % IV SOLN
Freq: Once | INTRAVENOUS | Status: AC
  Filled 2021-01-31: qty 250

## 2021-01-31 MED ORDER — MAGNESIUM SULFATE 4 GM/100ML IV SOLN
INTRAVENOUS | Status: AC
  Filled 2021-01-31: qty 100

## 2021-01-31 MED ORDER — SODIUM CHLORIDE 0.9 % IV SOLN
750.0000 mg/m2 | Freq: Once | INTRAVENOUS | Status: AC
  Administered 2021-01-31: 2014 mg via INTRAVENOUS
  Filled 2021-01-31: qty 52.97

## 2021-01-31 MED ORDER — POTASSIUM CHLORIDE IN NACL 20-0.9 MEQ/L-% IV SOLN
INTRAVENOUS | Status: AC
  Filled 2021-01-31: qty 1000

## 2021-01-31 MED ORDER — MAGNESIUM SULFATE 4 GM/100ML IV SOLN
4.0000 g | Freq: Once | INTRAVENOUS | Status: AC
  Administered 2021-01-31: 4 g via INTRAVENOUS

## 2021-01-31 MED ORDER — SODIUM CHLORIDE 0.9 % IV SOLN
18.7500 mg/m2 | Freq: Once | INTRAVENOUS | Status: AC
  Administered 2021-01-31: 50 mg via INTRAVENOUS
  Filled 2021-01-31: qty 50

## 2021-01-31 MED ORDER — SODIUM CHLORIDE 0.9 % IV SOLN
10.0000 mg | Freq: Once | INTRAVENOUS | Status: AC
  Administered 2021-01-31: 10 mg via INTRAVENOUS
  Filled 2021-01-31: qty 10

## 2021-01-31 MED ORDER — PALONOSETRON HCL INJECTION 0.25 MG/5ML
0.2500 mg | Freq: Once | INTRAVENOUS | Status: AC
  Administered 2021-01-31: 0.25 mg via INTRAVENOUS

## 2021-01-31 MED ORDER — SODIUM CHLORIDE 0.9% FLUSH
10.0000 mL | INTRAVENOUS | Status: DC | PRN
Start: 2021-01-31 — End: 2021-02-13
  Filled 2021-01-31: qty 10

## 2021-01-31 MED ORDER — PALONOSETRON HCL INJECTION 0.25 MG/5ML
INTRAVENOUS | Status: AC
  Filled 2021-01-31: qty 5

## 2021-02-09 NOTE — Progress Notes (Signed)
[facility]  [address] [place],  [place]  [address] [phone]  Clinic Day: 02/13/2021  Referring physician: Bonnita Nasuti, MD  This document serves as a record of services personally performed by [name], MD. It was created on their behalf by Curry,Lauren E, a trained medical scribe. The creation of this record is based on the scribe's personal observations and the provider's statements to them.  HISTORY OF PRESENT ILLNESS:  The patient is a 80 y.o. 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  He comes in today to be evaluated before heading into his 6th cycle of cisplatin/gemcitabine, which he receives weekly, every 2/3 weeks..  The patient claims to have tolerated his last cycle of treatment fairly well.  Of note, day 8 of his 5th cycle of chemotherapy had to held due to low counts.  He denies having abdominal pain or other GI symptoms which concern him for disease progression.    PHYSICAL EXAM:  Blood pressure 136/65, pulse 95, temperature 99 F (37.2 C), resp. rate 16, height $RemoveBe'6\' 2"'[id number]$  (1.88 m), weight 290 lb 8 oz (131.8 kg), SpO2 92 %. Wt Readings from Last 3 Encounters:  02/13/21 290 lb 8 oz (131.8 kg)  01/24/21 291 lb (132 kg)  01/23/21 288 lb 11.2 oz (131 kg)   Body mass index is 37.3 kg/m. Performance status (ECOG): 1 - Symptomatic but completely ambulatory Physical Exam Constitutional:      Appearance: Normal appearance. He is not ill-appearing.  HENT:     Mouth/Throat:     Mouth: Mucous membranes are moist.     Pharynx: Oropharynx is clear. No oropharyngeal exudate or posterior oropharyngeal erythema.  Cardiovascular:     Rate and Rhythm: Normal rate and regular rhythm.     Heart sounds: No murmur heard. No friction rub. No gallop.   Pulmonary:     Effort: Pulmonary effort is normal. No respiratory  distress.     Breath sounds: Normal breath sounds. No wheezing, rhonchi or rales.  Chest:  Breasts:     Right: No axillary adenopathy or supraclavicular adenopathy.     Left: No axillary adenopathy or supraclavicular adenopathy.    Abdominal:     General: Bowel sounds are normal. There is no distension.     Palpations: Abdomen is soft. There is no mass (no palpable abdominal passes are appreciated).     Tenderness: There is no abdominal tenderness.  Musculoskeletal:        General: No swelling.     Right lower leg: No edema.     Left lower leg: No edema.  Lymphadenopathy:     Cervical: No cervical adenopathy.     Upper Body:     Right upper body: No supraclavicular or axillary adenopathy.     Left upper body: No supraclavicular or axillary adenopathy.     Lower Body: No right inguinal adenopathy. No left inguinal adenopathy.  Skin:    General: Skin is warm.     Coloration: Skin is not jaundiced.     Findings: No lesion or rash.  Neurological:     General: No focal deficit present.     Mental Status: He is alert and oriented to person, place, and time. Mental status is at baseline.     Cranial Nerves: Cranial nerves are intact.  Psychiatric:        Mood and Affect: Mood normal.  Thought Content: Thought content normal.     LABS:    Ref. Range 01/19/2021 00:00 02/13/2021  Sodium Latest Ref Range: 137 - 147  142 140  Potassium Latest Ref Range: 3.4 - 5.3  4.2 4.2  Chloride Latest Ref Range: 99 - 108  106 108 (A)  CO2 Latest Ref Range: 13 - 22  27 (A) 25  Glucose Unknown 134 118  BUN Latest Ref Range: 4 - 21  24 (A) 17  Creatinine Latest Ref Range: 0.6 - 1.3  1.4 (A) 1.2  Calcium Latest Ref Range: 8.7 - 10.7  8.9 8.5  Magnesium Latest Ref Range: 1.6 - 2.3  1.6 1.5 (A)  Alkaline Phosphatase Latest Ref Range: 25 - 125  52 51  Albumin Latest Ref Range: 3.5 - 5.0  4.0 3.9  AST Latest Ref Range: 14 - 40  24 24  ALT Latest Ref Range: 10 - 40  16 15  Bilirubin, Total  Unknown 0.6 0.5  WBC Unknown 7.1 4.6  RBC Latest Ref Range: 3.87 - 5.11  3.34 (A) 2.77 (A)  Hemoglobin Latest Ref Range: 13.5 - 17.5  10.4 (A) 9.0 (A)  HCT Latest Ref Range: 41 - 53  32 (A) 26.7 (A)  MCV Latest Ref Range: 80 - 94  95 (A) 96 (A)  Platelets Latest Ref Range: 150 - 399  126 (A) 161  NEUT# Unknown 5.18 3.22    ASSESSMENT & PLAN:  Assessment/Plan:  A 80 y.o. male with metastatic FGFR2 mutation positive cholangiocarcinoma.  He will proceed with his 6th cycle of cisplatin/gemcitiabine this week. In clinic today, he was orthostatic per his systolic.  Based upon this, I will ensure he gets 2L of fluids with his chemo tomorrow.  As his hemoglobin has fallen below 10, he will receive Retacrit 30,000 units with this cycle of treatment.  I will see this patient back in 3 weeks before he heads in to his 7th cycle of cisplatin/gemcitiabine.  The patient understands all the plans discussed today and is in agreement with them.     I, Rita Ohara, am acting as scribe for [name], MD    I have reviewed this report as typed by the medical scribe, and it is complete and accurate.  Dequincy Macarthur Critchley, MD

## 2021-02-12 ENCOUNTER — Other Ambulatory Visit: Payer: Self-pay | Admitting: Oncology

## 2021-02-13 ENCOUNTER — Inpatient Hospital Stay: Payer: Medicare Other

## 2021-02-13 ENCOUNTER — Other Ambulatory Visit: Payer: Self-pay | Admitting: Oncology

## 2021-02-13 ENCOUNTER — Inpatient Hospital Stay (INDEPENDENT_AMBULATORY_CARE_PROVIDER_SITE_OTHER): Payer: Medicare Other | Admitting: Oncology

## 2021-02-13 ENCOUNTER — Encounter: Payer: Self-pay | Admitting: Oncology

## 2021-02-13 ENCOUNTER — Telehealth: Payer: Self-pay | Admitting: Oncology

## 2021-02-13 VITALS — BP 127/60 | HR 86 | Temp 99.0°F | Resp 16 | Ht 74.0 in | Wt 290.5 lb

## 2021-02-13 DIAGNOSIS — C240 Malignant neoplasm of extrahepatic bile duct: Secondary | ICD-10-CM

## 2021-02-13 LAB — COMPREHENSIVE METABOLIC PANEL
Albumin: 3.9 (ref 3.5–5.0)
Calcium: 8.5 — AB (ref 8.7–10.7)

## 2021-02-13 LAB — CBC AND DIFFERENTIAL
HCT: 27 — AB (ref 41–53)
Hemoglobin: 9 — AB (ref 13.5–17.5)
Neutrophils Absolute: 3.22
Platelets: 161 (ref 150–399)
WBC: 4.6

## 2021-02-13 LAB — BASIC METABOLIC PANEL
BUN: 17 (ref 4–21)
CO2: 25 — AB (ref 13–22)
Chloride: 108 (ref 99–108)
Creatinine: 1.2 (ref 0.6–1.3)
Glucose: 118
Potassium: 4.2 (ref 3.4–5.3)
Sodium: 140 (ref 137–147)

## 2021-02-13 LAB — HEPATIC FUNCTION PANEL
ALT: 15 (ref 10–40)
AST: 24 (ref 14–40)
Alkaline Phosphatase: 51 (ref 25–125)
Bilirubin, Total: 0.5

## 2021-02-13 LAB — CBC: RBC: 2.77 — AB (ref 3.87–5.11)

## 2021-02-13 NOTE — Telephone Encounter (Signed)
Per 5/23 LOS, patient scheduled for next round of Labs, Follow Up, Infusion.  Gave patient Appt Summary

## 2021-02-14 ENCOUNTER — Inpatient Hospital Stay: Payer: Medicare Other

## 2021-02-14 ENCOUNTER — Other Ambulatory Visit: Payer: Self-pay

## 2021-02-14 VITALS — BP 120/64 | HR 86 | Temp 98.6°F | Resp 16 | Wt 291.1 lb

## 2021-02-14 DIAGNOSIS — Z5111 Encounter for antineoplastic chemotherapy: Secondary | ICD-10-CM | POA: Diagnosis not present

## 2021-02-14 DIAGNOSIS — C240 Malignant neoplasm of extrahepatic bile duct: Secondary | ICD-10-CM

## 2021-02-14 DIAGNOSIS — D6481 Anemia due to antineoplastic chemotherapy: Secondary | ICD-10-CM

## 2021-02-14 DIAGNOSIS — T451X5A Adverse effect of antineoplastic and immunosuppressive drugs, initial encounter: Secondary | ICD-10-CM

## 2021-02-14 MED ORDER — POTASSIUM CHLORIDE IN NACL 20-0.9 MEQ/L-% IV SOLN
INTRAVENOUS | Status: AC
  Filled 2021-02-14: qty 1000

## 2021-02-14 MED ORDER — EPOETIN ALFA-EPBX 40000 UNIT/ML IJ SOLN
30000.0000 [IU] | Freq: Once | INTRAMUSCULAR | Status: AC
  Administered 2021-02-14: 30000 [IU] via SUBCUTANEOUS

## 2021-02-14 MED ORDER — SODIUM CHLORIDE 0.9 % IV SOLN
Freq: Once | INTRAVENOUS | Status: DC
  Filled 2021-02-14: qty 250

## 2021-02-14 MED ORDER — SODIUM CHLORIDE 0.9 % IV SOLN
750.0000 mg/m2 | Freq: Once | INTRAVENOUS | Status: AC
  Administered 2021-02-14: 2014 mg via INTRAVENOUS
  Filled 2021-02-14: qty 52.97

## 2021-02-14 MED ORDER — PALONOSETRON HCL INJECTION 0.25 MG/5ML
0.2500 mg | Freq: Once | INTRAVENOUS | Status: AC
  Administered 2021-02-14: 0.25 mg via INTRAVENOUS

## 2021-02-14 MED ORDER — EPOETIN ALFA-EPBX 40000 UNIT/ML IJ SOLN
INTRAMUSCULAR | Status: AC
  Filled 2021-02-14: qty 1

## 2021-02-14 MED ORDER — SODIUM CHLORIDE 0.9 % IV SOLN
10.0000 mg | Freq: Once | INTRAVENOUS | Status: AC
  Administered 2021-02-14: 10 mg via INTRAVENOUS
  Filled 2021-02-14: qty 10

## 2021-02-14 MED ORDER — POTASSIUM CHLORIDE IN NACL 20-0.9 MEQ/L-% IV SOLN
Freq: Once | INTRAVENOUS | Status: AC
Start: 2021-02-14 — End: 2021-02-14

## 2021-02-14 MED ORDER — SODIUM CHLORIDE 0.9 % IV SOLN
18.7500 mg/m2 | Freq: Once | INTRAVENOUS | Status: AC
  Administered 2021-02-14: 50 mg via INTRAVENOUS
  Filled 2021-02-14: qty 50

## 2021-02-14 MED ORDER — MAGNESIUM SULFATE 2 GM/50ML IV SOLN
2.0000 g | Freq: Once | INTRAVENOUS | Status: AC
  Administered 2021-02-14: 2 g via INTRAVENOUS

## 2021-02-14 MED ORDER — MAGNESIUM SULFATE 2 GM/50ML IV SOLN
INTRAVENOUS | Status: AC
  Filled 2021-02-14: qty 50

## 2021-02-14 MED ORDER — SODIUM CHLORIDE 0.9 % IV SOLN
Freq: Once | INTRAVENOUS | Status: AC
  Filled 2021-02-14: qty 250

## 2021-02-14 MED ORDER — PALONOSETRON HCL INJECTION 0.25 MG/5ML
INTRAVENOUS | Status: AC
  Filled 2021-02-14: qty 5

## 2021-02-14 MED ORDER — SODIUM CHLORIDE 0.9 % IV SOLN
150.0000 mg | Freq: Once | INTRAVENOUS | Status: AC
  Administered 2021-02-14: 150 mg via INTRAVENOUS
  Filled 2021-02-14: qty 150

## 2021-02-14 NOTE — Progress Notes (Signed)
Pt d/c stable at 1524 

## 2021-02-14 NOTE — Patient Instructions (Signed)
[facility]  Discharge Instructions: Thank you for choosing [facility] to provide your oncology and hematology care.  If you have a lab appointment with the [facility], please go directly to the [facility] and check in at the registration area.   Wear comfortable clothing and clothing appropriate for easy access to any Portacath or PICC line.   We strive to give you quality time with your provider. You may need to reschedule your appointment if you arrive late (15 or more minutes).  Arriving late affects you and other patients whose appointments are after yours.  Also, if you miss three or more appointments without notifying the office, you may be dismissed from the clinic at the provider's discretion.      For prescription refill requests, have your pharmacy contact our office and allow 72 hours for refills to be completed.    Today you received the following chemotherapy and/or immunotherapy agents Gemzar/cisplatin      To help prevent nausea and vomiting after your treatment, we encourage you to take your nausea medication as directed.  BELOW ARE SYMPTOMS THAT SHOULD BE REPORTED IMMEDIATELY: . *FEVER GREATER THAN 100.4 F (38 C) OR HIGHER . *CHILLS OR SWEATING . *NAUSEA AND VOMITING THAT IS NOT CONTROLLED WITH YOUR NAUSEA MEDICATION . *UNUSUAL SHORTNESS OF BREATH . *UNUSUAL BRUISING OR BLEEDING . *URINARY PROBLEMS (pain or burning when urinating, or frequent urination) . *BOWEL PROBLEMS (unusual diarrhea, constipation, pain near the anus) . TENDERNESS IN MOUTH AND THROAT WITH OR WITHOUT PRESENCE OF ULCERS (sore throat, sores in mouth, or a toothache) . UNUSUAL RASH, SWELLING OR PAIN  . UNUSUAL VAGINAL DISCHARGE OR ITCHING   Items with * indicate a potential emergency and should be followed up as soon as possible or go to the Emergency Department if any problems should occur.  Please show the CHEMOTHERAPY ALERT CARD or IMMUNOTHERAPY ALERT  CARD at check-in to the Emergency Department and triage nurse.  Should you have questions after your visit or need to cancel or reschedule your appointment, please contact [facility]  Dept: [phone]  and follow the prompts.  Office hours are 8:00 a.m. to 4:30 p.m. Monday - Friday. Please note that voicemails left after 4:00 p.m. may not be returned until the following business day.  We are closed weekends and major holidays. You have access to a nurse at all times for urgent questions. Please call the main number to the clinic Dept: [phone] and follow the prompts.  For any non-urgent questions, you may also contact your provider using MyChart. We now offer e-Visits for anyone 33 and older to request care online for non-urgent symptoms. For details visit [email or web address].   Also download the MyChart app! Go to the app store, search "MyChart", open the app, select [facility], and log in with your MyChart username and password.  Due to Covid, a mask is required upon entering the hospital/clinic. If you do not have a mask, one will be given to you upon arrival. For doctor visits, patients may have 1 support person aged 73 or older with them. For treatment visits, patients cannot have anyone with them due to current Covid guidelines and our immunocompromised population.   Cisplatin injection What is this medicine? CISPLATIN (SIS pla tin) is a chemotherapy drug. It targets fast dividing cells, like cancer cells, and causes these cells to die. This medicine is used to treat many types of cancer like bladder, ovarian,  and testicular cancers. This medicine may be used for other purposes; ask your health care provider or pharmacist if you have questions. COMMON BRAND NAME(S): Platinol, Platinol -AQ What should I tell my health care provider before I take this medicine? They need to know if you have any of these conditions:  eye disease, vision problems  hearing  problems  kidney disease  low blood counts, like white cells, platelets, or red blood cells  tingling of the fingers or toes, or other nerve disorder  an unusual or allergic reaction to cisplatin, carboplatin, oxaliplatin, other medicines, foods, dyes, or preservatives  pregnant or trying to get pregnant  breast-feeding How should I use this medicine? This drug is given as an infusion into a vein. It is administered in a hospital or clinic by a specially trained health care professional. Talk to your pediatrician regarding the use of this medicine in children. Special care may be needed. Overdosage: If you think you have taken too much of this medicine contact a poison control center or emergency room at once. NOTE: This medicine is only for you. Do not share this medicine with others. What if I miss a dose? It is important not to miss a dose. Call your doctor or health care professional if you are unable to keep an appointment. What may interact with this medicine? This medicine may interact with the following medications:  foscarnet  certain antibiotics like amikacin, gentamicin, neomycin, polymyxin B, streptomycin, tobramycin, vancomycin This list may not describe all possible interactions. Give your health care provider a list of all the medicines, herbs, non-prescription drugs, or dietary supplements you use. Also tell them if you smoke, drink alcohol, or use illegal drugs. Some items may interact with your medicine. What should I watch for while using this medicine? Your condition will be monitored carefully while you are receiving this medicine. You will need important blood work done while you are taking this medicine. This drug may make you feel generally unwell. This is not uncommon, as chemotherapy can affect healthy cells as well as cancer cells. Report any side effects. Continue your course of treatment even though you feel ill unless your doctor tells you to stop. This  medicine may increase your risk of getting an infection. Call your healthcare professional for advice if you get a fever, chills, or sore throat, or other symptoms of a cold or flu. Do not treat yourself. Try to avoid being around people who are sick. Avoid taking medicines that contain aspirin, acetaminophen, ibuprofen, naproxen, or ketoprofen unless instructed by your healthcare professional. These medicines may hide a fever. This medicine may increase your risk to bruise or bleed. Call your doctor or health care professional if you notice any unusual bleeding. Be careful brushing and flossing your teeth or using a toothpick because you may get an infection or bleed more easily. If you have any dental work done, tell your dentist you are receiving this medicine. Do not become pregnant while taking this medicine or for 14 months after stopping it. Women should inform their healthcare professional if they wish to become pregnant or think they might be pregnant. Men should not father a child while taking this medicine and for 11 months after stopping it. There is potential for serious side effects to an unborn child. Talk to your healthcare professional for more information. Do not breast-feed an infant while taking this medicine. This medicine has caused ovarian failure in some women. This medicine may make it more difficult  to get pregnant. Talk to your healthcare professional if you are concerned about your fertility. This medicine has caused decreased sperm counts in some men. This may make it more difficult to father a child. Talk to your healthcare professional if you are concerned about your fertility. Drink fluids as directed while you are taking this medicine. This will help protect your kidneys. Call your doctor or health care professional if you get diarrhea. Do not treat yourself. What side effects may I notice from receiving this medicine? Side effects that you should report to your doctor or  health care professional as soon as possible:  allergic reactions like skin rash, itching or hives, swelling of the face, lips, or tongue  blurred vision  changes in vision  decreased hearing or ringing of the ears  nausea, vomiting  pain, redness, or irritation at site where injected  pain, tingling, numbness in the hands or feet  signs and symptoms of bleeding such as bloody or black, tarry stools; red or dark brown urine; spitting up blood or brown material that looks like coffee grounds; red spots on the skin; unusual bruising or bleeding from the eyes, gums, or nose  signs and symptoms of infection like fever; chills; cough; sore throat; pain or trouble passing urine  signs and symptoms of kidney injury like trouble passing urine or change in the amount of urine  signs and symptoms of low red blood cells or anemia such as unusually weak or tired; feeling faint or lightheaded; falls; breathing problems Side effects that usually do not require medical attention (report to your doctor or health care professional if they continue or are bothersome):  loss of appetite  mouth sores  muscle cramps This list may not describe all possible side effects. Call your doctor for medical advice about side effects. You may report side effects to FDA at 1-800-FDA-1088. Where should I keep my medicine? This drug is given in a hospital or clinic and will not be stored at home. NOTE: This sheet is a summary. It may not cover all possible information. If you have questions about this medicine, talk to your doctor, pharmacist, or health care provider.  2021 Elsevier/Gold Standard (2018-09-05 15:59:17) Gemcitabine injection What is this medicine? GEMCITABINE (jem SYE ta been) is a chemotherapy drug. This medicine is used to treat many types of cancer like breast cancer, lung cancer, pancreatic cancer, and ovarian cancer. This medicine may be used for other purposes; ask your health care provider  or pharmacist if you have questions. COMMON BRAND NAME(S): Gemzar, Infugem What should I tell my health care provider before I take this medicine? They need to know if you have any of these conditions:  blood disorders  infection  kidney disease  liver disease  lung or breathing disease, like asthma  recent or ongoing radiation therapy  an unusual or allergic reaction to gemcitabine, other chemotherapy, other medicines, foods, dyes, or preservatives  pregnant or trying to get pregnant  breast-feeding How should I use this medicine? This drug is given as an infusion into a vein. It is administered in a hospital or clinic by a specially trained health care professional. Talk to your pediatrician regarding the use of this medicine in children. Special care may be needed. Overdosage: If you think you have taken too much of this medicine contact a poison control center or emergency room at once. NOTE: This medicine is only for you. Do not share this medicine with others. What if I miss a  dose? It is important not to miss your dose. Call your doctor or health care professional if you are unable to keep an appointment. What may interact with this medicine?  medicines to increase blood counts like filgrastim, pegfilgrastim, sargramostim  some other chemotherapy drugs like cisplatin  vaccines Talk to your doctor or health care professional before taking any of these medicines:  acetaminophen  aspirin  ibuprofen  ketoprofen  naproxen This list may not describe all possible interactions. Give your health care provider a list of all the medicines, herbs, non-prescription drugs, or dietary supplements you use. Also tell them if you smoke, drink alcohol, or use illegal drugs. Some items may interact with your medicine. What should I watch for while using this medicine? Visit your doctor for checks on your progress. This drug may make you feel generally unwell. This is not uncommon,  as chemotherapy can affect healthy cells as well as cancer cells. Report any side effects. Continue your course of treatment even though you feel ill unless your doctor tells you to stop. In some cases, you may be given additional medicines to help with side effects. Follow all directions for their use. Call your doctor or health care professional for advice if you get a fever, chills or sore throat, or other symptoms of a cold or flu. Do not treat yourself. This drug decreases your body's ability to fight infections. Try to avoid being around people who are sick. This medicine may increase your risk to bruise or bleed. Call your doctor or health care professional if you notice any unusual bleeding. Be careful brushing and flossing your teeth or using a toothpick because you may get an infection or bleed more easily. If you have any dental work done, tell your dentist you are receiving this medicine. Avoid taking products that contain aspirin, acetaminophen, ibuprofen, naproxen, or ketoprofen unless instructed by your doctor. These medicines may hide a fever. Do not become pregnant while taking this medicine or for 6 months after stopping it. Women should inform their doctor if they wish to become pregnant or think they might be pregnant. Men should not father a child while taking this medicine and for 3 months after stopping it. There is a potential for serious side effects to an unborn child. Talk to your health care professional or pharmacist for more information. Do not breast-feed an infant while taking this medicine or for at least 1 week after stopping it. Men should inform their doctors if they wish to father a child. This medicine may lower sperm counts. Talk with your doctor or health care professional if you are concerned about your fertility. What side effects may I notice from receiving this medicine? Side effects that you should report to your doctor or health care professional as soon as  possible:  allergic reactions like skin rash, itching or hives, swelling of the face, lips, or tongue  breathing problems  pain, redness, or irritation at site where injected  signs and symptoms of a dangerous change in heartbeat or heart rhythm like chest pain; dizziness; fast or irregular heartbeat; palpitations; feeling faint or lightheaded, falls; breathing problems  signs of decreased platelets or bleeding - bruising, pinpoint red spots on the skin, black, tarry stools, blood in the urine  signs of decreased red blood cells - unusually weak or tired, feeling faint or lightheaded, falls  signs of infection - fever or chills, cough, sore throat, pain or difficulty passing urine  signs and symptoms of kidney injury  like trouble passing urine or change in the amount of urine  signs and symptoms of liver injury like dark yellow or brown urine; general ill feeling or flu-like symptoms; light-colored stools; loss of appetite; nausea; right upper belly pain; unusually weak or tired; yellowing of the eyes or skin  swelling of ankles, feet, hands Side effects that usually do not require medical attention (report to your doctor or health care professional if they continue or are bothersome):  constipation  diarrhea  hair loss  loss of appetite  nausea  rash  vomiting This list may not describe all possible side effects. Call your doctor for medical advice about side effects. You may report side effects to FDA at 1-800-FDA-1088. Where should I keep my medicine? This drug is given in a hospital or clinic and will not be stored at home. NOTE: This sheet is a summary. It may not cover all possible information. If you have questions about this medicine, talk to your doctor, pharmacist, or health care provider.  2021 Elsevier/Gold Standard (2017-12-04 18:06:11) Epoetin Alfa injection What is this medicine? EPOETIN ALFA (e POE e tin AL fa) helps your body make more red blood cells.  This medicine is used to treat anemia caused by chronic kidney disease, cancer chemotherapy, or HIV-therapy. It may also be used before surgery if you have anemia. This medicine may be used for other purposes; ask your health care provider or pharmacist if you have questions. COMMON BRAND NAME(S): Epogen, Procrit, Retacrit What should I tell my health care provider before I take this medicine? They need to know if you have any of these conditions:  cancer  heart disease  high blood pressure  history of blood clots  history of stroke  low levels of folate, iron, or vitamin B12 in the blood  seizures  an unusual or allergic reaction to erythropoietin, albumin, benzyl alcohol, hamster proteins, other medicines, foods, dyes, or preservatives  pregnant or trying to get pregnant  breast-feeding How should I use this medicine? This medicine is for injection into a vein or under the skin. It is usually given by a health care professional in a hospital or clinic setting. If you get this medicine at home, you will be taught how to prepare and give this medicine. Use exactly as directed. Take your medicine at regular intervals. Do not take your medicine more often than directed. It is important that you put your used needles and syringes in a special sharps container. Do not put them in a trash can. If you do not have a sharps container, call your pharmacist or healthcare provider to get one. A special MedGuide will be given to you by the pharmacist with each prescription and refill. Be sure to read this information carefully each time. Talk to your pediatrician regarding the use of this medicine in children. While this drug may be prescribed for selected conditions, precautions do apply. Overdosage: If you think you have taken too much of this medicine contact a poison control center or emergency room at once. NOTE: This medicine is only for you. Do not share this medicine with others. What if  I miss a dose? If you miss a dose, take it as soon as you can. If it is almost time for your next dose, take only that dose. Do not take double or extra doses. What may interact with this medicine? Interactions have not been studied. This list may not describe all possible interactions. Give your health care provider a  list of all the medicines, herbs, non-prescription drugs, or dietary supplements you use. Also tell them if you smoke, drink alcohol, or use illegal drugs. Some items may interact with your medicine. What should I watch for while using this medicine? Your condition will be monitored carefully while you are receiving this medicine. You may need blood work done while you are taking this medicine. This medicine may cause a decrease in vitamin B6. You should make sure that you get enough vitamin B6 while you are taking this medicine. Discuss the foods you eat and the vitamins you take with your health care professional. What side effects may I notice from receiving this medicine? Side effects that you should report to your doctor or health care professional as soon as possible:  allergic reactions like skin rash, itching or hives, swelling of the face, lips, or tongue  seizures  signs and symptoms of a blood clot such as breathing problems; changes in vision; chest pain; severe, sudden headache; pain, swelling, warmth in the leg; trouble speaking; sudden numbness or weakness of the face, arm or leg  signs and symptoms of a stroke like changes in vision; confusion; trouble speaking or understanding; severe headaches; sudden numbness or weakness of the face, arm or leg; trouble walking; dizziness; loss of balance or coordination Side effects that usually do not require medical attention (report to your doctor or health care professional if they continue or are bothersome):  chills  cough  dizziness  fever  headaches  joint pain  muscle cramps  muscle pain  nausea,  vomiting  pain, redness, or irritation at site where injected This list may not describe all possible side effects. Call your doctor for medical advice about side effects. You may report side effects to FDA at 1-800-FDA-1088. Where should I keep my medicine? Keep out of the reach of children. Store in a refrigerator between 2 and 8 degrees C (36 and 46 degrees F). Do not freeze or shake. Throw away any unused portion if using a single-dose vial. Multi-dose vials can be kept in the refrigerator for up to 21 days after the initial dose. Throw away unused medicine. NOTE: This sheet is a summary. It may not cover all possible information. If you have questions about this medicine, talk to your doctor, pharmacist, or health care provider.  2021 Elsevier/Gold Standard (2017-04-19 08:35:19) Hypomagnesemia Hypomagnesemia is a condition in which the level of magnesium in the blood is low. Magnesium is a mineral that is found in many foods. It is used in many different processes in the body. Hypomagnesemia can affect every organ in the body. In severe cases, it can cause life-threatening problems. What are the causes? This condition may be caused by:  Not getting enough magnesium in your diet.  Malnutrition.  Problems with absorbing magnesium from the intestines.  Dehydration.  Alcohol abuse.  Vomiting.  Severe or chronic diarrhea.  Some medicines, including medicines that make you urinate more (diuretics).  Certain diseases, such as kidney disease, diabetes, celiac disease, and overactive thyroid. What are the signs or symptoms? Symptoms of this condition include:  Loss of appetite.  Nausea and vomiting.  Involuntary shaking or trembling of a body part (tremor).  Muscle weakness.  Tingling in the arms and legs.  Sudden tightening of muscles (muscle spasms).  Confusion.  Psychiatric issues, such as depression, irritability, or psychosis.  A feeling of fluttering of the  heart.  Seizures. These symptoms are more severe if magnesium levels drop suddenly. How  is this diagnosed? This condition may be diagnosed based on:  Your symptoms and medical history.  A physical exam.  Blood and urine tests. How is this treated? Treatment depends on the cause and the severity of the condition. It may be treated with:  A magnesium supplement. This can be taken in pill form. If the condition is severe, magnesium is usually given through an IV.  Changes to your diet. You may be directed to eat foods that have a lot of magnesium, such as green leafy vegetables, peas, beans, and nuts.  Stopping any intake of alcohol.   Follow these instructions at home:  Make sure that your diet includes foods with magnesium. Foods that have a lot of magnesium in them include: ? Green leafy vegetables, such as spinach and broccoli. ? Beans and peas. ? Nuts and seeds, such as almonds and sunflower seeds. ? Whole grains, such as whole grain bread and fortified cereals.  Take magnesium supplements if your health care provider tells you to do that. Take them as directed.  Take over-the-counter and prescription medicines only as told by your health care provider.  Have your magnesium levels monitored as told by your health care provider.  When you are active, drink fluids that contain electrolytes.  Avoid drinking alcohol.  Keep all follow-up visits as told by your health care provider. This is important.      Contact a health care provider if:  You get worse instead of better.  Your symptoms return. Get help right away if you:  Develop severe muscle weakness.  Have trouble breathing.  Feel that your heart is racing. Summary  Hypomagnesemia is a condition in which the level of magnesium in the blood is low.  Hypomagnesemia can affect every organ in the body.  Treatment may include eating more foods that contain magnesium, taking magnesium supplements, and not  drinking alcohol.  Have your magnesium levels monitored as told by your health care provider. This information is not intended to replace advice given to you by your health care provider. Make sure you discuss any questions you have with your health care provider. Document Revised: 02/11/2020 Document Reviewed: 02/11/2020 Elsevier Patient Education  [place].

## 2021-02-16 ENCOUNTER — Encounter: Payer: Self-pay | Admitting: Oncology

## 2021-02-21 ENCOUNTER — Other Ambulatory Visit: Payer: Self-pay

## 2021-02-21 ENCOUNTER — Encounter: Payer: Self-pay | Admitting: Hematology and Oncology

## 2021-02-21 ENCOUNTER — Inpatient Hospital Stay: Payer: Medicare Other

## 2021-02-21 DIAGNOSIS — C240 Malignant neoplasm of extrahepatic bile duct: Secondary | ICD-10-CM

## 2021-02-21 LAB — HEPATIC FUNCTION PANEL
ALT: 20 (ref 10–40)
AST: 27 (ref 14–40)
Alkaline Phosphatase: 51 (ref 25–125)
Bilirubin, Total: 0.5

## 2021-02-21 LAB — COMPREHENSIVE METABOLIC PANEL
Albumin: 4.1 (ref 3.5–5.0)
Calcium: 8.9 (ref 8.7–10.7)

## 2021-02-21 LAB — CBC AND DIFFERENTIAL
HCT: 28 — AB (ref 41–53)
Hemoglobin: 9.2 — AB (ref 13.5–17.5)
Neutrophils Absolute: 1.13
Platelets: 288 (ref 150–399)
WBC: 2.5

## 2021-02-21 LAB — BASIC METABOLIC PANEL
BUN: 24 — AB (ref 4–21)
CO2: 28 — AB (ref 13–22)
Chloride: 105 (ref 99–108)
Creatinine: 1.1 (ref 0.6–1.3)
Glucose: 125
Potassium: 4.6 (ref 3.4–5.3)
Sodium: 140 (ref 137–147)

## 2021-02-21 LAB — CBC: RBC: 2.86 — AB (ref 3.87–5.11)

## 2021-02-22 ENCOUNTER — Other Ambulatory Visit: Payer: Self-pay | Admitting: Pharmacist

## 2021-02-22 NOTE — Progress Notes (Signed)
Proceed with day 8 cisplatin and gemcitabine despite ANC=1130 (1080 manual) per Dr. Bobby Rumpf.  Pt is already receiving a 25% dose reduction.

## 2021-02-23 ENCOUNTER — Inpatient Hospital Stay: Payer: Medicare Other | Attending: Oncology

## 2021-02-23 ENCOUNTER — Other Ambulatory Visit: Payer: Self-pay

## 2021-02-23 VITALS — BP 109/64 | HR 75 | Temp 98.1°F | Resp 18 | Ht 74.0 in | Wt 291.1 lb

## 2021-02-23 DIAGNOSIS — Z803 Family history of malignant neoplasm of breast: Secondary | ICD-10-CM | POA: Diagnosis not present

## 2021-02-23 DIAGNOSIS — C220 Liver cell carcinoma: Secondary | ICD-10-CM | POA: Insufficient documentation

## 2021-02-23 DIAGNOSIS — K219 Gastro-esophageal reflux disease without esophagitis: Secondary | ICD-10-CM | POA: Diagnosis not present

## 2021-02-23 DIAGNOSIS — I209 Angina pectoris, unspecified: Secondary | ICD-10-CM | POA: Insufficient documentation

## 2021-02-23 DIAGNOSIS — E039 Hypothyroidism, unspecified: Secondary | ICD-10-CM | POA: Diagnosis not present

## 2021-02-23 DIAGNOSIS — Z79899 Other long term (current) drug therapy: Secondary | ICD-10-CM | POA: Insufficient documentation

## 2021-02-23 DIAGNOSIS — J449 Chronic obstructive pulmonary disease, unspecified: Secondary | ICD-10-CM | POA: Diagnosis not present

## 2021-02-23 DIAGNOSIS — I10 Essential (primary) hypertension: Secondary | ICD-10-CM | POA: Diagnosis not present

## 2021-02-23 DIAGNOSIS — Z7982 Long term (current) use of aspirin: Secondary | ICD-10-CM | POA: Insufficient documentation

## 2021-02-23 DIAGNOSIS — E1165 Type 2 diabetes mellitus with hyperglycemia: Secondary | ICD-10-CM | POA: Insufficient documentation

## 2021-02-23 DIAGNOSIS — Z5111 Encounter for antineoplastic chemotherapy: Secondary | ICD-10-CM | POA: Insufficient documentation

## 2021-02-23 DIAGNOSIS — C240 Malignant neoplasm of extrahepatic bile duct: Secondary | ICD-10-CM

## 2021-02-23 DIAGNOSIS — C786 Secondary malignant neoplasm of retroperitoneum and peritoneum: Secondary | ICD-10-CM | POA: Insufficient documentation

## 2021-02-23 DIAGNOSIS — E559 Vitamin D deficiency, unspecified: Secondary | ICD-10-CM | POA: Insufficient documentation

## 2021-02-23 MED ORDER — SODIUM CHLORIDE 0.9 % IV SOLN
150.0000 mg | Freq: Once | INTRAVENOUS | Status: AC
  Administered 2021-02-23: 150 mg via INTRAVENOUS
  Filled 2021-02-23: qty 150

## 2021-02-23 MED ORDER — PALONOSETRON HCL INJECTION 0.25 MG/5ML
0.2500 mg | Freq: Once | INTRAVENOUS | Status: AC
  Administered 2021-02-23: 0.25 mg via INTRAVENOUS

## 2021-02-23 MED ORDER — DEXAMETHASONE SODIUM PHOSPHATE 100 MG/10ML IJ SOLN
10.0000 mg | Freq: Once | INTRAMUSCULAR | Status: AC
  Administered 2021-02-23: 10 mg via INTRAVENOUS
  Filled 2021-02-23: qty 10

## 2021-02-23 MED ORDER — MAGNESIUM SULFATE 4 GM/100ML IV SOLN
4.0000 g | Freq: Once | INTRAVENOUS | Status: AC
  Administered 2021-02-23: 4 g via INTRAVENOUS

## 2021-02-23 MED ORDER — SODIUM CHLORIDE 0.9 % IV SOLN
750.0000 mg/m2 | Freq: Once | INTRAVENOUS | Status: AC
  Administered 2021-02-23: 2014 mg via INTRAVENOUS
  Filled 2021-02-23: qty 52.97

## 2021-02-23 MED ORDER — PALONOSETRON HCL INJECTION 0.25 MG/5ML
INTRAVENOUS | Status: AC
  Filled 2021-02-23: qty 5

## 2021-02-23 MED ORDER — SODIUM CHLORIDE 0.9 % IV SOLN
18.7500 mg/m2 | Freq: Once | INTRAVENOUS | Status: AC
  Administered 2021-02-23: 50 mg via INTRAVENOUS
  Filled 2021-02-23: qty 50

## 2021-02-23 MED ORDER — SODIUM CHLORIDE 0.9 % IV SOLN
Freq: Once | INTRAVENOUS | Status: AC
  Filled 2021-02-23: qty 250

## 2021-02-23 MED ORDER — MAGNESIUM SULFATE 4 GM/100ML IV SOLN
INTRAVENOUS | Status: AC
  Filled 2021-02-23: qty 100

## 2021-02-23 MED ORDER — POTASSIUM CHLORIDE IN NACL 20-0.9 MEQ/L-% IV SOLN
INTRAVENOUS | Status: AC
  Filled 2021-02-23: qty 1000

## 2021-02-23 MED ORDER — POTASSIUM CHLORIDE IN NACL 20-0.9 MEQ/L-% IV SOLN: Freq: Once | INTRAVENOUS | Status: AC

## 2021-02-23 MED ORDER — SODIUM CHLORIDE 0.9% FLUSH
10.0000 mL | INTRAVENOUS | Status: DC | PRN
  Administered 2021-02-23: 10 mL
  Filled 2021-02-23: qty 10

## 2021-02-23 NOTE — Progress Notes (Signed)
Pt d/c stable at [facility]

## 2021-02-23 NOTE — Patient Instructions (Signed)
[facility]  Discharge Instructions: Thank you for choosing [facility] to provide your oncology and hematology care.  If you have a lab appointment with the [facility], please go directly to the [facility] and check in at the registration area.   Wear comfortable clothing and clothing appropriate for easy access to any Portacath or PICC line.   We strive to give you quality time with your provider. You may need to reschedule your appointment if you arrive late (15 or more minutes).  Arriving late affects you and other patients whose appointments are after yours.  Also, if you miss three or more appointments without notifying the office, you may be dismissed from the clinic at the provider's discretion.      For prescription refill requests, have your pharmacy contact our office and allow 72 hours for refills to be completed.    Today you received the following chemotherapy and/or immunotherapy agents Gemzar/cisplatin      To help prevent nausea and vomiting after your treatment, we encourage you to take your nausea medication as directed.  BELOW ARE SYMPTOMS THAT SHOULD BE REPORTED IMMEDIATELY: . *FEVER GREATER THAN 100.4 F (38 C) OR HIGHER . *CHILLS OR SWEATING . *NAUSEA AND VOMITING THAT IS NOT CONTROLLED WITH YOUR NAUSEA MEDICATION . *UNUSUAL SHORTNESS OF BREATH . *UNUSUAL BRUISING OR BLEEDING . *URINARY PROBLEMS (pain or burning when urinating, or frequent urination) . *BOWEL PROBLEMS (unusual diarrhea, constipation, pain near the anus) . TENDERNESS IN MOUTH AND THROAT WITH OR WITHOUT PRESENCE OF ULCERS (sore throat, sores in mouth, or a toothache) . UNUSUAL RASH, SWELLING OR PAIN  . UNUSUAL VAGINAL DISCHARGE OR ITCHING   Items with * indicate a potential emergency and should be followed up as soon as possible or go to the Emergency Department if any problems should occur.  Please show the CHEMOTHERAPY ALERT CARD or IMMUNOTHERAPY ALERT  CARD at check-in to the Emergency Department and triage nurse.  Should you have questions after your visit or need to cancel or reschedule your appointment, please contact [facility]  Dept: [phone]  and follow the prompts.  Office hours are 8:00 a.m. to 4:30 p.m. Monday - Friday. Please note that voicemails left after 4:00 p.m. may not be returned until the following business day.  We are closed weekends and major holidays. You have access to a nurse at all times for urgent questions. Please call the main number to the clinic Dept: [phone] and follow the prompts.  For any non-urgent questions, you may also contact your provider using MyChart. We now offer e-Visits for anyone 55 and older to request care online for non-urgent symptoms. For details visit [email or web address].   Also download the MyChart app! Go to the app store, search "MyChart", open the app, select [facility], and log in with your MyChart username and password.  Due to Covid, a mask is required upon entering the hospital/clinic. If you do not have a mask, one will be given to you upon arrival. For doctor visits, patients may have 1 support person aged 73 or older with them. For treatment visits, patients cannot have anyone with them due to current Covid guidelines and our immunocompromised population.   Gemcitabine injection What is this medicine? GEMCITABINE (jem SYE ta been) is a chemotherapy drug. This medicine is used to treat many types of cancer like breast cancer, lung cancer, pancreatic cancer, and ovarian cancer. This medicine may be used for  other purposes; ask your health care provider or pharmacist if you have questions. COMMON BRAND NAME(S): Gemzar, Infugem What should I tell my health care provider before I take this medicine? They need to know if you have any of these conditions:  blood disorders  infection  kidney disease  liver disease  lung or breathing disease, like  asthma  recent or ongoing radiation therapy  an unusual or allergic reaction to gemcitabine, other chemotherapy, other medicines, foods, dyes, or preservatives  pregnant or trying to get pregnant  breast-feeding How should I use this medicine? This drug is given as an infusion into a vein. It is administered in a hospital or clinic by a specially trained health care professional. Talk to your pediatrician regarding the use of this medicine in children. Special care may be needed. Overdosage: If you think you have taken too much of this medicine contact a poison control center or emergency room at once. NOTE: This medicine is only for you. Do not share this medicine with others. What if I miss a dose? It is important not to miss your dose. Call your doctor or health care professional if you are unable to keep an appointment. What may interact with this medicine?  medicines to increase blood counts like filgrastim, pegfilgrastim, sargramostim  some other chemotherapy drugs like cisplatin  vaccines Talk to your doctor or health care professional before taking any of these medicines:  acetaminophen  aspirin  ibuprofen  ketoprofen  naproxen This list may not describe all possible interactions. Give your health care provider a list of all the medicines, herbs, non-prescription drugs, or dietary supplements you use. Also tell them if you smoke, drink alcohol, or use illegal drugs. Some items may interact with your medicine. What should I watch for while using this medicine? Visit your doctor for checks on your progress. This drug may make you feel generally unwell. This is not uncommon, as chemotherapy can affect healthy cells as well as cancer cells. Report any side effects. Continue your course of treatment even though you feel ill unless your doctor tells you to stop. In some cases, you may be given additional medicines to help with side effects. Follow all directions for their  use. Call your doctor or health care professional for advice if you get a fever, chills or sore throat, or other symptoms of a cold or flu. Do not treat yourself. This drug decreases your body's ability to fight infections. Try to avoid being around people who are sick. This medicine may increase your risk to bruise or bleed. Call your doctor or health care professional if you notice any unusual bleeding. Be careful brushing and flossing your teeth or using a toothpick because you may get an infection or bleed more easily. If you have any dental work done, tell your dentist you are receiving this medicine. Avoid taking products that contain aspirin, acetaminophen, ibuprofen, naproxen, or ketoprofen unless instructed by your doctor. These medicines may hide a fever. Do not become pregnant while taking this medicine or for 6 months after stopping it. Women should inform their doctor if they wish to become pregnant or think they might be pregnant. Men should not father a child while taking this medicine and for 3 months after stopping it. There is a potential for serious side effects to an unborn child. Talk to your health care professional or pharmacist for more information. Do not breast-feed an infant while taking this medicine or for at least 1 week after stopping  it. Men should inform their doctors if they wish to father a child. This medicine may lower sperm counts. Talk with your doctor or health care professional if you are concerned about your fertility. What side effects may I notice from receiving this medicine? Side effects that you should report to your doctor or health care professional as soon as possible:  allergic reactions like skin rash, itching or hives, swelling of the face, lips, or tongue  breathing problems  pain, redness, or irritation at site where injected  signs and symptoms of a dangerous change in heartbeat or heart rhythm like chest pain; dizziness; fast or irregular  heartbeat; palpitations; feeling faint or lightheaded, falls; breathing problems  signs of decreased platelets or bleeding - bruising, pinpoint red spots on the skin, black, tarry stools, blood in the urine  signs of decreased red blood cells - unusually weak or tired, feeling faint or lightheaded, falls  signs of infection - fever or chills, cough, sore throat, pain or difficulty passing urine  signs and symptoms of kidney injury like trouble passing urine or change in the amount of urine  signs and symptoms of liver injury like dark yellow or brown urine; general ill feeling or flu-like symptoms; light-colored stools; loss of appetite; nausea; right upper belly pain; unusually weak or tired; yellowing of the eyes or skin  swelling of ankles, feet, hands Side effects that usually do not require medical attention (report to your doctor or health care professional if they continue or are bothersome):  constipation  diarrhea  hair loss  loss of appetite  nausea  rash  vomiting This list may not describe all possible side effects. Call your doctor for medical advice about side effects. You may report side effects to FDA at 1-800-FDA-1088. Where should I keep my medicine? This drug is given in a hospital or clinic and will not be stored at home. NOTE: This sheet is a summary. It may not cover all possible information. If you have questions about this medicine, talk to your doctor, pharmacist, or health care provider.  2021 Elsevier/Gold Standard (2017-12-04 18:06:11) Cisplatin injection What is this medicine? CISPLATIN (SIS pla tin) is a chemotherapy drug. It targets fast dividing cells, like cancer cells, and causes these cells to die. This medicine is used to treat many types of cancer like bladder, ovarian, and testicular cancers. This medicine may be used for other purposes; ask your health care provider or pharmacist if you have questions. COMMON BRAND NAME(S): Platinol,  Platinol -AQ What should I tell my health care provider before I take this medicine? They need to know if you have any of these conditions:  eye disease, vision problems  hearing problems  kidney disease  low blood counts, like white cells, platelets, or red blood cells  tingling of the fingers or toes, or other nerve disorder  an unusual or allergic reaction to cisplatin, carboplatin, oxaliplatin, other medicines, foods, dyes, or preservatives  pregnant or trying to get pregnant  breast-feeding How should I use this medicine? This drug is given as an infusion into a vein. It is administered in a hospital or clinic by a specially trained health care professional. Talk to your pediatrician regarding the use of this medicine in children. Special care may be needed. Overdosage: If you think you have taken too much of this medicine contact a poison control center or emergency room at once. NOTE: This medicine is only for you. Do not share this medicine with others. What if  I miss a dose? It is important not to miss a dose. Call your doctor or health care professional if you are unable to keep an appointment. What may interact with this medicine? This medicine may interact with the following medications:  foscarnet  certain antibiotics like amikacin, gentamicin, neomycin, polymyxin B, streptomycin, tobramycin, vancomycin This list may not describe all possible interactions. Give your health care provider a list of all the medicines, herbs, non-prescription drugs, or dietary supplements you use. Also tell them if you smoke, drink alcohol, or use illegal drugs. Some items may interact with your medicine. What should I watch for while using this medicine? Your condition will be monitored carefully while you are receiving this medicine. You will need important blood work done while you are taking this medicine. This drug may make you feel generally unwell. This is not uncommon, as  chemotherapy can affect healthy cells as well as cancer cells. Report any side effects. Continue your course of treatment even though you feel ill unless your doctor tells you to stop. This medicine may increase your risk of getting an infection. Call your healthcare professional for advice if you get a fever, chills, or sore throat, or other symptoms of a cold or flu. Do not treat yourself. Try to avoid being around people who are sick. Avoid taking medicines that contain aspirin, acetaminophen, ibuprofen, naproxen, or ketoprofen unless instructed by your healthcare professional. These medicines may hide a fever. This medicine may increase your risk to bruise or bleed. Call your doctor or health care professional if you notice any unusual bleeding. Be careful brushing and flossing your teeth or using a toothpick because you may get an infection or bleed more easily. If you have any dental work done, tell your dentist you are receiving this medicine. Do not become pregnant while taking this medicine or for 14 months after stopping it. Women should inform their healthcare professional if they wish to become pregnant or think they might be pregnant. Men should not father a child while taking this medicine and for 11 months after stopping it. There is potential for serious side effects to an unborn child. Talk to your healthcare professional for more information. Do not breast-feed an infant while taking this medicine. This medicine has caused ovarian failure in some women. This medicine may make it more difficult to get pregnant. Talk to your healthcare professional if you are concerned about your fertility. This medicine has caused decreased sperm counts in some men. This may make it more difficult to father a child. Talk to your healthcare professional if you are concerned about your fertility. Drink fluids as directed while you are taking this medicine. This will help protect your kidneys. Call your  doctor or health care professional if you get diarrhea. Do not treat yourself. What side effects may I notice from receiving this medicine? Side effects that you should report to your doctor or health care professional as soon as possible:  allergic reactions like skin rash, itching or hives, swelling of the face, lips, or tongue  blurred vision  changes in vision  decreased hearing or ringing of the ears  nausea, vomiting  pain, redness, or irritation at site where injected  pain, tingling, numbness in the hands or feet  signs and symptoms of bleeding such as bloody or black, tarry stools; red or dark brown urine; spitting up blood or brown material that looks like coffee grounds; red spots on the skin; unusual bruising or bleeding from the  eyes, gums, or nose  signs and symptoms of infection like fever; chills; cough; sore throat; pain or trouble passing urine  signs and symptoms of kidney injury like trouble passing urine or change in the amount of urine  signs and symptoms of low red blood cells or anemia such as unusually weak or tired; feeling faint or lightheaded; falls; breathing problems Side effects that usually do not require medical attention (report to your doctor or health care professional if they continue or are bothersome):  loss of appetite  mouth sores  muscle cramps This list may not describe all possible side effects. Call your doctor for medical advice about side effects. You may report side effects to FDA at 1-800-FDA-1088. Where should I keep my medicine? This drug is given in a hospital or clinic and will not be stored at home. NOTE: This sheet is a summary. It may not cover all possible information. If you have questions about this medicine, talk to your doctor, pharmacist, or health care provider.  2021 Elsevier/Gold Standard (2018-09-05 15:59:17)

## 2021-02-28 ENCOUNTER — Encounter: Payer: Self-pay | Admitting: Oncology

## 2021-03-01 ENCOUNTER — Other Ambulatory Visit: Payer: Self-pay

## 2021-03-01 ENCOUNTER — Other Ambulatory Visit: Payer: Self-pay | Admitting: Hematology and Oncology

## 2021-03-01 ENCOUNTER — Inpatient Hospital Stay: Payer: Medicare Other

## 2021-03-01 ENCOUNTER — Inpatient Hospital Stay (INDEPENDENT_AMBULATORY_CARE_PROVIDER_SITE_OTHER): Payer: Medicare Other | Admitting: Hematology and Oncology

## 2021-03-01 VITALS — BP 104/56 | HR 73 | Temp 98.1°F | Resp 18 | Ht 74.0 in | Wt 295.0 lb

## 2021-03-01 DIAGNOSIS — C240 Malignant neoplasm of extrahepatic bile duct: Secondary | ICD-10-CM

## 2021-03-01 DIAGNOSIS — Z5111 Encounter for antineoplastic chemotherapy: Secondary | ICD-10-CM | POA: Diagnosis not present

## 2021-03-01 DIAGNOSIS — D649 Anemia, unspecified: Secondary | ICD-10-CM

## 2021-03-01 LAB — CBC AND DIFFERENTIAL
HCT: 25 — AB (ref 41–53)
Hemoglobin: 8.2 — AB (ref 13.5–17.5)
Neutrophils Absolute: 1.58
Platelets: 76 — AB (ref 150–399)
WBC: 2.2

## 2021-03-01 LAB — COMPREHENSIVE METABOLIC PANEL
Albumin: 3.9 (ref 3.5–5.0)
Calcium: 8.7 (ref 8.7–10.7)

## 2021-03-01 LAB — HEPATIC FUNCTION PANEL
ALT: 17 (ref 10–40)
AST: 22 (ref 14–40)
Alkaline Phosphatase: 47 (ref 25–125)
Bilirubin, Total: 0.7

## 2021-03-01 LAB — BASIC METABOLIC PANEL
BUN: 21 (ref 4–21)
CO2: 30 — AB (ref 13–22)
Chloride: 103 (ref 99–108)
Creatinine: 1.1 (ref 0.6–1.3)
Glucose: 136
Potassium: 4.4 (ref 3.4–5.3)
Sodium: 138 (ref 137–147)

## 2021-03-01 LAB — CBC: RBC: 2.59 — AB (ref 3.87–5.11)

## 2021-03-02 ENCOUNTER — Other Ambulatory Visit: Payer: Self-pay | Admitting: Hematology and Oncology

## 2021-03-02 ENCOUNTER — Other Ambulatory Visit: Payer: Self-pay

## 2021-03-02 ENCOUNTER — Telehealth: Payer: Self-pay

## 2021-03-02 DIAGNOSIS — C240 Malignant neoplasm of extrahepatic bile duct: Secondary | ICD-10-CM

## 2021-03-02 LAB — ABO/RH: ABO/RH(D): O POS

## 2021-03-02 LAB — PREPARE RBC (CROSSMATCH)

## 2021-03-02 NOTE — Progress Notes (Signed)
[facility]  [address] [place],  [place]  [address] [phone]  Clinic Day: 03/06/2021  Referring physician: Hague, Imran P, MD  This document serves as a record of services personally performed by Dequincy A Lewis, MD. It was created on their behalf by Curry,Lauren E, a trained medical scribe. The creation of this record is based on the scribe's personal observations and the provider's statements to them.  HISTORY OF PRESENT ILLNESS:  The patient is a 80 y.o. 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  He comes in today to be evaluated before heading into his 7th cycle of cisplatin/gemcitabine, which he receives weekly, every 2/3 weeks..  The patient claims to have tolerated his last cycle of treatment fairly well.  However, as he had symptomatic anemia last week, he did need a blood transfusion, which he received.  He has felt much better since getting blood.  Although he wishes to proceed with chemotherapy, he believes some type of change needs to occur so his daily quality of life will not suffer while on it.    PHYSICAL EXAM:  Blood pressure (!) 168/80, pulse 80, temperature 98.5 F (36.9 C), resp. rate 18, height 6' 2" (1.88 m), weight 294 lb 1.6 oz (133.4 kg), SpO2 91 %. Wt Readings from Last 3 Encounters:  03/06/21 294 lb 1.6 oz (133.4 kg)  03/01/21 295 lb (133.8 kg)  02/23/21 291 lb 1.3 oz (132 kg)   Body mass index is 37.76 kg/m. Performance status (ECOG): 1 - Symptomatic but completely ambulatory Physical Exam Constitutional:      Appearance: Normal appearance. He is not ill-appearing.  HENT:     Mouth/Throat:     Mouth: Mucous membranes are moist.     Pharynx: Oropharynx is clear. No oropharyngeal exudate or posterior oropharyngeal erythema.  Cardiovascular:     Rate and Rhythm: Normal rate and regular rhythm.      Heart sounds: No murmur heard.   No friction rub. No gallop.  Pulmonary:     Effort: Pulmonary effort is normal. No respiratory distress.     Breath sounds: Normal breath sounds. No wheezing, rhonchi or rales.  Chest:  Breasts:    Right: No axillary adenopathy or supraclavicular adenopathy.     Left: No axillary adenopathy or supraclavicular adenopathy.  Abdominal:     General: Bowel sounds are normal. There is no distension.     Palpations: Abdomen is soft. There is no mass (no palpable abdominal passes are appreciated).     Tenderness: There is no abdominal tenderness.  Musculoskeletal:        General: No swelling.     Right lower leg: No edema.     Left lower leg: No edema.  Lymphadenopathy:     Cervical: No cervical adenopathy.     Upper Body:     Right upper body: No supraclavicular or axillary adenopathy.     Left upper body: No supraclavicular or axillary adenopathy.     Lower Body: No right inguinal adenopathy. No left inguinal adenopathy.  Skin:    General: Skin is warm.     Coloration: Skin is not jaundiced.     Findings: No lesion or rash.  Neurological:     General: No focal deficit present.     Mental Status: He is alert and oriented to person, place, and time. Mental status is at baseline.       Cranial Nerves: Cranial nerves are intact.  Psychiatric:        Mood and Affect: Mood normal.        Thought Content: Thought content normal.    LABS:      ASSESSMENT & PLAN:  Assessment/Plan:  A 80 y.o. male with metastatic FGFR2 mutation positive cholangiocarcinoma.  He will proceed with his 7th cycle of cisplatin/gemcitiabine this week.  However, due the progressive decline in his quality of life, I have suggested that his treatments be spaced out to where he will be on a day 1,15 regimen, with each cycle being spaced out to every 4 weeks.  My hope is that getting a treatment once every 2 weeks will give him more time to recuperate and his bone marrow more time to  reproduce blood cells before he scheduled for a subsequent treatment.  The patient is in agreement with this plan.  I will see him back in 4 weeks before he heads into his 8th cycle of cisplatin/gemcitiabine.  The patient understands all the plans discussed today and is in agreement with them.     I, Rita Ohara, am acting as scribe for [name], MD    I have reviewed this report as typed by the medical scribe, and it is complete and accurate.  Dequincy Macarthur Critchley, MD

## 2021-03-02 NOTE — Progress Notes (Signed)
[facility]  [address] [place],    [id number] [phone]  Clinic Day:  03/03/2021  Referring physician: Bonnita Nasuti, MD   CHIEF COMPLAINT:  CC: A 80 year old male with history of malignant tumor of extrahepatic bile duct here for symptom management  Current Treatment:  Cisplatin/ Gemcitabine   HISTORY OF PRESENT ILLNESS:  Gary [name] is a 80 y.o. 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   INTERVAL HISTORY:  Gary [name] is here today for symptom management. He presents with a two to three day history of increasing weakness, dizziness and shortness of breath. He denies fever, chills, nausea or vomiting. He denies chest pain or cough. He denies issue with bowel or bladder. CBC today reveals hemoglobin 8.2 and hematocrit 25.1.   REVIEW OF SYSTEMS:  Review of Systems  Constitutional:  Positive for fatigue. Negative for appetite change, chills, diaphoresis, fever and unexpected weight change.  HENT:   Negative for hearing loss, lump/mass, mouth sores, nosebleeds, sore throat, tinnitus, trouble swallowing and voice change.   Eyes:  Negative for eye problems and icterus.  Respiratory:  Positive for shortness of breath. Negative for chest tightness, cough, hemoptysis and wheezing.   Cardiovascular:  Negative for chest pain, leg swelling and palpitations.  Gastrointestinal:  Negative for abdominal distention, abdominal pain, blood in stool, constipation, diarrhea, nausea, rectal pain and vomiting.  Endocrine: Negative for hot flashes.  Genitourinary:  Negative for bladder incontinence, difficulty urinating, dyspareunia, dysuria, frequency, hematuria and nocturia.   Musculoskeletal:  Negative for arthralgias, back pain, flank pain, gait problem, myalgias, neck pain and neck stiffness.  Skin:  Negative for itching, rash  and wound.  Neurological:  Positive for dizziness and light-headedness. Negative for extremity weakness, gait problem, headaches, numbness, seizures and speech difficulty.  Hematological:  Negative for adenopathy. Does not bruise/bleed easily.  Psychiatric/Behavioral:  Negative for confusion, decreased concentration, depression, sleep disturbance and suicidal ideas. The patient is not nervous/anxious.     VITALS:  Blood pressure (!) 104/56, pulse 73, temperature 98.1 F (36.7 C), temperature source Oral, resp. rate 18, height 6\' 2"  (1.88 m), weight 295 lb (133.8 kg), SpO2 97 %.  Wt Readings from Last 3 Encounters:  03/01/21 295 lb (133.8 kg)  02/23/21 291 lb 1.3 oz (132 kg)  02/14/21 291 lb 1.3 oz (132 kg)    Body mass index is 37.88 kg/m.  Performance status (ECOG): 1 - Symptomatic but completely ambulatory  PHYSICAL EXAM:  Physical Exam Constitutional:      General: He is not in acute distress.    Appearance: Normal appearance. He is normal weight. He is not ill-appearing, toxic-appearing or diaphoretic.  HENT:     Head: Normocephalic and atraumatic.     Nose: Nose normal. No congestion or rhinorrhea.     Mouth/Throat:     Mouth: Mucous membranes are moist.     Pharynx: Oropharynx is clear. No oropharyngeal exudate or posterior oropharyngeal erythema.  Eyes:     General: No scleral icterus.       Right eye: No discharge.        Left eye: No discharge.     Extraocular Movements: Extraocular movements intact.     Conjunctiva/sclera: Conjunctivae normal.     Pupils: Pupils are equal, round, and reactive to light.  Neck:     Vascular: No carotid bruit.  Cardiovascular:     Rate  and Rhythm: Normal rate and regular rhythm.     Heart sounds: No murmur heard.   No friction rub. No gallop.  Pulmonary:     Effort: Pulmonary effort is normal. No respiratory distress.     Breath sounds: Normal breath sounds. No stridor. No wheezing, rhonchi or rales.  Chest:     Chest wall: No  tenderness.  Musculoskeletal:        General: No swelling, tenderness, deformity or signs of injury. Normal range of motion.     Cervical back: Normal range of motion and neck supple. No rigidity or tenderness.     Right lower leg: No edema.     Left lower leg: No edema.  Lymphadenopathy:     Cervical: No cervical adenopathy.  Skin:    General: Skin is warm and dry.     Capillary Refill: Capillary refill takes less than 2 seconds.     Coloration: Skin is not jaundiced or pale.     Findings: No bruising, erythema, lesion or rash.  Neurological:     General: No focal deficit present.     Mental Status: He is alert and oriented to person, place, and time. Mental status is at baseline.     Cranial Nerves: No cranial nerve deficit.     Sensory: No sensory deficit.     Motor: No weakness.     Coordination: Coordination normal.     Gait: Gait normal.     Deep Tendon Reflexes: Reflexes normal.  Psychiatric:        Mood and Affect: Mood normal.        Behavior: Behavior normal.        Thought Content: Thought content normal.        Judgment: Judgment normal.    LABS:   CBC Latest Ref Rng & Units 03/01/2021 02/21/2021 02/13/2021  WBC - 2.2 2.5 4.6  Hemoglobin 13.5 - 17.5 8.2(A) 9.2(A) 9.0(A)  Hematocrit 41 - 53 25(A) 28(A) 27(A)  Platelets 150 - 399 76(A) 288 161   CMP Latest Ref Rng & Units 03/01/2021 02/21/2021 02/13/2021  Glucose 70 - 99 mg/dL - - -  BUN 4 - 21 21 24(A) 17  Creatinine 0.6 - 1.3 1.1 1.1 1.2  Sodium 137 - 147 138 140 140  Potassium 3.4 - 5.3 4.4 4.6 4.2  Chloride 99 - 108 103 105 108  CO2 13 - 22 30(A) 28(A) 25(A)  Calcium 8.7 - 10.7 8.7 8.9 8.5(A)  Total Protein 6.5 - 8.1 g/dL - - -  Total Bilirubin 0.3 - 1.2 mg/dL - - -  Alkaline Phos 25 - 125 47 51 51  AST 14 - 40 22 27 24   ALT 10 - 40 17 20 15      No results found for: CEA1 / No results found for: CEA1 No results found for: PSA1 No results found for: TDV761 No results found for: YWV371  No results found  for: TOTALPROTELP, ALBUMINELP, A1GS, A2GS, BETS, BETA2SER, GAMS, MSPIKE, SPEI No results found for: TIBC, FERRITIN, IRONPCTSAT No results found for: LDH  STUDIES:  No results found.    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[place])    Cardiomegaly 07/29/2020   Cardiomyopathy ([place]) 04/24/2019   CHF (congestive heart failure) ([place])  Cholangiocarcinoma of liver ([place]) 12/29/2017   Chronic obstructive pulmonary disease ([place]) 12/13/2020   Chronic pain syndrome 0/17/4944   Chronic systolic heart failure ([place]) 12/13/2020   Coronary artery disease with PTCA and stenting of mid and distal circumflex artery on 07/11/2020 96/03/5915   Diastolic congestive heart failure ([place]) 04/24/2019   Dizziness 08/30/2020   Dyspnea on exertion 04/24/2019   Encounter for therapeutic drug level monitoring 06/15/2020   Essential hypertension 04/24/2019   Gastro-esophageal reflux disease without esophagitis 12/13/2020   Generalized anxiety disorder 12/13/2020   Hyperglycemia due to type 2 diabetes mellitus ([place]) 12/13/2020   Hypertension    Hypothyroidism 12/13/2020   Liver tumor 12/27/2017   Formatting of this note might be different from the original. 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   Mixed hyperlipidemia 12/13/2020   NSTEMI (non-ST elevated myocardial infarction) ([place]) 07/09/2020   Orthostatic hypotension 10/20/2020   Other long term (current) drug therapy 12/13/2020   Other vitamin B12 deficiency anemias 12/13/2020   Pain in left hip 12/13/2020   Preop cardiovascular exam 06/15/2020    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/30/2020   Type 2 diabetes mellitus without complications ([place]) 3/84/6659   Unsteadiness on feet 08/30/2020   UTI (urinary tract infection) 01/08/2018   Vitamin D deficiency 12/13/2020    Past Surgical History:  Procedure Laterality Date   BILE DUCT EXPLORATION     To remove cancer   CORONARY STENT INTERVENTION N/A 07/11/2020   Procedure: CORONARY STENT INTERVENTION;  Surgeon: Leonie Man, MD;  Location: [facility];  Service: Cardiovascular;  Laterality: N/A;   LEFT HEART CATH AND CORONARY ANGIOGRAPHY N/A 07/11/2020   Procedure: LEFT HEART CATH AND CORONARY ANGIOGRAPHY;  Surgeon: Leonie Man, MD;  Location: [facility];  Service: Cardiovascular;  Laterality: N/A;   MOHS SURGERY     Basil cell on nose    Family History  Problem Relation Age of Onset   Cancer Father    Breast cancer Sister    Breast cancer Sister     Social History:  reports that he has never smoked. His smokeless tobacco use includes chew. He reports previous alcohol use. He reports that he does not use drugs.The patient is alone  today.  Allergies:  Allergies  Allergen Reactions   Pork-Derived Products Anaphylaxis   Amoxicillin Rash and Other (See Comments)    Current Medications: Current Outpatient Medications  Medication Sig Dispense Refill   ALPRAZolam (XANAX) 0.5 MG tablet Take 0.5 mg by mouth 3 (three) times daily as needed for anxiety.      aspirin EC 81 MG tablet Take 1 tablet (81 mg total) by mouth daily. Swallow whole. 90 tablet 0   atorvastatin (LIPITOR) 20 MG tablet TAKE 1 TABLET (20 MG TOTAL) BY MOUTH DAILY. 90 tablet 1   buPROPion (WELLBUTRIN XL) 300 MG 24 hr tablet Take 1 tablet by mouth daily. For 90 days     calcium carbonate (TUMS - DOSED IN MG ELEMENTAL CALCIUM) 500 MG chewable tablet Chew 1 tablet by mouth in the morning and at bedtime. Total of 1200 mg daily     carvedilol (COREG) 6.25 MG tablet Take 1 tablet by mouth twice  daily (Patient taking differently: Take 6.25 mg by mouth 2 (two) times daily with a meal.)  180 tablet 3   cholecalciferol (VITAMIN D3) 25 MCG (1000 UNIT) tablet Take 1,000 Units by mouth daily.     clopidogrel (PLAVIX) 75 MG tablet Take 75 mg by mouth daily.     furosemide (LASIX) 20 MG tablet Take 20 mg by mouth daily.     HYDROcodone-acetaminophen (NORCO) 10-325 MG tablet Take 1 tablet by mouth 3 (three) times daily as needed for moderate pain or severe pain.     isosorbide mononitrate (IMDUR) 30 MG 24 hr tablet Take 1 tablet by mouth once daily 90 tablet 3   losartan (COZAAR) 25 MG tablet Take 1 tablet by mouth in the morning and at bedtime.     montelukast (SINGULAIR) 10 MG tablet Take 1 tablet by mouth daily.     Multiple Vitamin (MULTIVITAMIN) tablet Take 1 tablet by mouth daily. Unknown Strength per patient     nitroGLYCERIN (NITROSTAT) 0.4 MG SL tablet Place 1 tablet (0.4 mg total) under the tongue every 5 (five) minutes as needed for chest pain. 25 tablet 0   Omega-3 Fatty Acids (FISH OIL) 1200 MG CPDR Take 1 tablet by mouth daily.     omeprazole (PRILOSEC) 40 MG capsule Take 1 capsule by mouth daily as needed (Acid reflux).     ondansetron (ZOFRAN) 4 MG tablet TAKE 1 TABLET BY MOUTH EVERY 4 HOURS AS NEEDED FOR NAUSEA OR VOMITING (Patient taking differently: Take 4 mg by mouth every 8 (eight) hours as needed for nausea or vomiting.) 30 tablet 2   pantoprazole (PROTONIX) 40 MG tablet Take 40 mg by mouth daily.     prochlorperazine (COMPAZINE) 10 MG tablet Take 1 tablet (10 mg total) by mouth every 6 (six) hours as needed for nausea or vomiting. 90 tablet 3   sacubitril-valsartan (ENTRESTO) 24-26 MG Take 1 tablet by mouth daily. Once a day per patient     tamsulosin (FLOMAX) 0.4 MG CAPS capsule Take 0.4 mg by mouth daily.      vitamin B-12 (CYANOCOBALAMIN) 250 MCG tablet Take 250 mcg by mouth daily.     Vitamin D, Ergocalciferol, 50 MCG (2000 UT) CAPS Take 1 tablet by mouth daily.     No  current facility-administered medications for this visit.     ASSESSMENT & PLAN:   Assessment:  Tyreon Frigon is a 80 y.o. male with malignant tumor of extrahepatic bile duct presenting with symptomatic anemia secondary to chemotherapy.   Plan: We will plan for transfusion of 1 until of packed red blood cells tomorrow.   The patient understands the plans discussed today and is in agreement with them.  He knows to contact our office if he develops concerns prior to his next appointment.     Melodye Ped, NP

## 2021-03-03 ENCOUNTER — Other Ambulatory Visit: Payer: Self-pay

## 2021-03-03 ENCOUNTER — Encounter: Payer: Self-pay | Admitting: Hematology and Oncology

## 2021-03-03 ENCOUNTER — Inpatient Hospital Stay: Payer: Medicare Other

## 2021-03-03 ENCOUNTER — Encounter: Payer: Self-pay | Admitting: Oncology

## 2021-03-03 DIAGNOSIS — C240 Malignant neoplasm of extrahepatic bile duct: Secondary | ICD-10-CM

## 2021-03-03 DIAGNOSIS — Z5111 Encounter for antineoplastic chemotherapy: Secondary | ICD-10-CM | POA: Diagnosis not present

## 2021-03-03 MED ORDER — ACETAMINOPHEN 325 MG PO TABS
650.0000 mg | ORAL_TABLET | Freq: Once | ORAL | Status: AC
  Administered 2021-03-03: 650 mg via ORAL

## 2021-03-03 MED ORDER — DIPHENHYDRAMINE HCL 25 MG PO CAPS
ORAL_CAPSULE | ORAL | Status: AC
  Filled 2021-03-03: qty 1

## 2021-03-03 MED ORDER — DIPHENHYDRAMINE HCL 25 MG PO CAPS
25.0000 mg | ORAL_CAPSULE | Freq: Once | ORAL | Status: AC
  Administered 2021-03-03: 25 mg via ORAL

## 2021-03-03 MED ORDER — SODIUM CHLORIDE 0.9% IV SOLUTION
250.0000 mL | Freq: Once | INTRAVENOUS | Status: AC
Start: 2021-03-03 — End: 2021-03-03
  Administered 2021-03-03: 250 mL via INTRAVENOUS
  Filled 2021-03-03: qty 250

## 2021-03-03 MED ORDER — SODIUM CHLORIDE 0.9% FLUSH
10.0000 mL | INTRAVENOUS | Status: AC | PRN
Start: 2021-03-03 — End: 2021-03-03
  Administered 2021-03-03: 10 mL
  Filled 2021-03-03: qty 10

## 2021-03-03 MED ORDER — ACETAMINOPHEN 325 MG PO TABS
ORAL_TABLET | ORAL | Status: AC
  Filled 2021-03-03: qty 2

## 2021-03-04 LAB — BPAM RBC
Blood Product Expiration Date: 202207112359
ISSUE DATE / TIME: 202206100743
Unit Type and Rh: 5100

## 2021-03-04 LAB — TYPE AND SCREEN
ABO/RH(D): O POS
Antibody Screen: NEGATIVE
Unit division: 0

## 2021-03-06 ENCOUNTER — Inpatient Hospital Stay: Payer: Medicare Other

## 2021-03-06 ENCOUNTER — Telehealth: Payer: Self-pay | Admitting: Oncology

## 2021-03-06 ENCOUNTER — Inpatient Hospital Stay (INDEPENDENT_AMBULATORY_CARE_PROVIDER_SITE_OTHER): Payer: Medicare Other | Admitting: Oncology

## 2021-03-06 ENCOUNTER — Encounter: Payer: Self-pay | Admitting: Oncology

## 2021-03-06 VITALS — BP 168/80 | HR 80 | Temp 98.5°F | Resp 18 | Ht 74.0 in | Wt 294.1 lb

## 2021-03-06 DIAGNOSIS — C240 Malignant neoplasm of extrahepatic bile duct: Secondary | ICD-10-CM

## 2021-03-06 LAB — COMPREHENSIVE METABOLIC PANEL
Albumin: 3.9 (ref 3.5–5.0)
Calcium: 8.4 — AB (ref 8.7–10.7)

## 2021-03-06 LAB — BASIC METABOLIC PANEL
BUN: 20 (ref 4–21)
CO2: 25 — AB (ref 13–22)
Chloride: 106 (ref 99–108)
Creatinine: 1.2 (ref 0.6–1.3)
Glucose: 118
Potassium: 4.7 (ref 3.4–5.3)
Sodium: 139 (ref 137–147)

## 2021-03-06 LAB — HEPATIC FUNCTION PANEL
ALT: 14 (ref 10–40)
AST: 25 (ref 14–40)
Alkaline Phosphatase: 52 (ref 25–125)
Bilirubin, Total: 0.6

## 2021-03-06 NOTE — Telephone Encounter (Signed)
Per 6/13 LOS, patient scheduled for 7/11,

## 2021-03-07 ENCOUNTER — Other Ambulatory Visit: Payer: Self-pay

## 2021-03-07 ENCOUNTER — Inpatient Hospital Stay: Payer: Medicare Other

## 2021-03-07 VITALS — BP 138/78 | HR 83 | Temp 98.2°F | Resp 18 | Ht 74.0 in | Wt 293.0 lb

## 2021-03-07 DIAGNOSIS — Z5111 Encounter for antineoplastic chemotherapy: Secondary | ICD-10-CM | POA: Diagnosis not present

## 2021-03-07 DIAGNOSIS — C240 Malignant neoplasm of extrahepatic bile duct: Secondary | ICD-10-CM

## 2021-03-07 MED ORDER — SODIUM CHLORIDE 0.9 % IV SOLN
Freq: Once | INTRAVENOUS | Status: AC
  Filled 2021-03-07: qty 250

## 2021-03-07 MED ORDER — MAGNESIUM SULFATE 4 GM/100ML IV SOLN
4.0000 g | Freq: Once | INTRAVENOUS | Status: AC
  Administered 2021-03-07: 4 g via INTRAVENOUS

## 2021-03-07 MED ORDER — SODIUM CHLORIDE 0.9% FLUSH
10.0000 mL | INTRAVENOUS | Status: DC | PRN
  Administered 2021-03-07: 10 mL
  Filled 2021-03-07: qty 10

## 2021-03-07 MED ORDER — MAGNESIUM SULFATE 4 GM/100ML IV SOLN
INTRAVENOUS | Status: AC
  Filled 2021-03-07: qty 100

## 2021-03-07 MED ORDER — PALONOSETRON HCL INJECTION 0.25 MG/5ML
INTRAVENOUS | Status: AC
  Filled 2021-03-07: qty 5

## 2021-03-07 MED ORDER — PALONOSETRON HCL INJECTION 0.25 MG/5ML
0.2500 mg | Freq: Once | INTRAVENOUS | Status: AC
  Administered 2021-03-07: 0.25 mg via INTRAVENOUS

## 2021-03-07 MED ORDER — SODIUM CHLORIDE 0.9 % IV SOLN
750.0000 mg/m2 | Freq: Once | INTRAVENOUS | Status: AC
  Administered 2021-03-07: 2014 mg via INTRAVENOUS
  Filled 2021-03-07: qty 52.97

## 2021-03-07 MED ORDER — SODIUM CHLORIDE 0.9 % IV SOLN
150.0000 mg | Freq: Once | INTRAVENOUS | Status: AC
  Administered 2021-03-07: 150 mg via INTRAVENOUS
  Filled 2021-03-07: qty 150

## 2021-03-07 MED ORDER — SODIUM CHLORIDE 0.9 % IV SOLN
10.0000 mg | Freq: Once | INTRAVENOUS | Status: AC
  Administered 2021-03-07: 10 mg via INTRAVENOUS
  Filled 2021-03-07: qty 10

## 2021-03-07 MED ORDER — SODIUM CHLORIDE 0.9 % IV SOLN
18.7500 mg/m2 | Freq: Once | INTRAVENOUS | Status: AC
  Administered 2021-03-07: 50 mg via INTRAVENOUS
  Filled 2021-03-07: qty 50

## 2021-03-07 MED ORDER — POTASSIUM CHLORIDE IN NACL 20-0.9 MEQ/L-% IV SOLN
INTRAVENOUS | Status: AC
  Filled 2021-03-07: qty 1000

## 2021-03-07 MED ORDER — POTASSIUM CHLORIDE IN NACL 20-0.9 MEQ/L-% IV SOLN: Freq: Once | INTRAVENOUS | Status: AC

## 2021-03-07 NOTE — Patient Instructions (Signed)
Gemcitabine injection What is this medication? GEMCITABINE (jem SYE ta been) is a chemotherapy drug. This medicine is used to treat many types of cancer like breast cancer, lung cancer, pancreatic cancer,and ovarian cancer. This medicine may be used for other purposes; ask your health care provider orpharmacist if you have questions. COMMON BRAND NAME(S): Gemzar, Infugem What should I tell my care team before I take this medication? They need to know if you have any of these conditions: blood disorders infection kidney disease liver disease lung or breathing disease, like asthma recent or ongoing radiation therapy an unusual or allergic reaction to gemcitabine, other chemotherapy, other medicines, foods, dyes, or preservatives pregnant or trying to get pregnant breast-feeding How should I use this medication? This drug is given as an infusion into a vein. It is administered in a hospitalor clinic by a specially trained health care professional. Talk to your pediatrician regarding the use of this medicine in children.Special care may be needed. Overdosage: If you think you have taken too much of this medicine contact apoison control center or emergency room at once. NOTE: This medicine is only for you. Do not share this medicine with others. What if I miss a dose? It is important not to miss your dose. Call your doctor or health careprofessional if you are unable to keep an appointment. What may interact with this medication? medicines to increase blood counts like filgrastim, pegfilgrastim, sargramostim some other chemotherapy drugs like cisplatin vaccines Talk to your doctor or health care professional before taking any of thesemedicines: acetaminophen aspirin ibuprofen ketoprofen naproxen This list may not describe all possible interactions. Give your health care provider a list of all the medicines, herbs, non-prescription drugs, or dietary supplements you use. Also tell them if  you smoke, drink alcohol, or use illegaldrugs. Some items may interact with your medicine. What should I watch for while using this medication? Visit your doctor for checks on your progress. This drug may make you feel generally unwell. This is not uncommon, as chemotherapy can affect healthy cells as well as cancer cells. Report any side effects. Continue your course oftreatment even though you feel ill unless your doctor tells you to stop. In some cases, you may be given additional medicines to help with side effects.Follow all directions for their use. Call your doctor or health care professional for advice if you get a fever, chills or sore throat, or other symptoms of a cold or flu. Do not treat yourself. This drug decreases your body's ability to fight infections. Try toavoid being around people who are sick. This medicine may increase your risk to bruise or bleed. Call your doctor orhealth care professional if you notice any unusual bleeding. Be careful brushing and flossing your teeth or using a toothpick because you may get an infection or bleed more easily. If you have any dental work done,tell your dentist you are receiving this medicine. Avoid taking products that contain aspirin, acetaminophen, ibuprofen, naproxen, or ketoprofen unless instructed by your doctor. These medicines may hide afever. Do not become pregnant while taking this medicine or for 6 months after stopping it. Women should inform their doctor if they wish to become pregnant or think they might be pregnant. Men should not father a child while taking this medicine and for 3 months after stopping it. There is a potential for serious side effects to an unborn child. Talk to your health care professional or pharmacist for more information. Do not breast-feed an infant while takingthis medicine or   for at least 1 week after stopping it. Men should inform their doctors if they wish to father a child. This medicine may lower sperm  counts. Talk with your doctor or health care professional ifyou are concerned about your fertility. What side effects may I notice from receiving this medication? Side effects that you should report to your doctor or health care professionalas soon as possible: allergic reactions like skin rash, itching or hives, swelling of the face, lips, or tongue breathing problems pain, redness, or irritation at site where injected signs and symptoms of a dangerous change in heartbeat or heart rhythm like chest pain; dizziness; fast or irregular heartbeat; palpitations; feeling faint or lightheaded, falls; breathing problems signs of decreased platelets or bleeding - bruising, pinpoint red spots on the skin, black, tarry stools, blood in the urine signs of decreased red blood cells - unusually weak or tired, feeling faint or lightheaded, falls signs of infection - fever or chills, cough, sore throat, pain or difficulty passing urine signs and symptoms of kidney injury like trouble passing urine or change in the amount of urine signs and symptoms of liver injury like dark yellow or brown urine; general ill feeling or flu-like symptoms; light-colored stools; loss of appetite; nausea; right upper belly pain; unusually weak or tired; yellowing of the eyes or skin swelling of ankles, feet, hands Side effects that usually do not require medical attention (report to yourdoctor or health care professional if they continue or are bothersome): constipation diarrhea hair loss loss of appetite nausea rash vomiting This list may not describe all possible side effects. Call your doctor for medical advice about side effects. You may report side effects to FDA at1-800-FDA-1088. Where should I keep my medication? This drug is given in a hospital or clinic and will not be stored at home. NOTE: This sheet is a summary. It may not cover all possible information. If you have questions about this medicine, talk to your  doctor, pharmacist, orhealth care provider.  2022 [employer]/Gold Standard (2017-12-04 18:06:11) Cisplatin injection What is this medication? CISPLATIN (SIS pla tin) is a chemotherapy drug. It targets fast dividing cells, like cancer cells, and causes these cells to die. This medicine is used totreat many types of cancer like bladder, ovarian, and testicular cancers. This medicine may be used for other purposes; ask your health care provider orpharmacist if you have questions. COMMON BRAND NAME(S): Platinol, Platinol -AQ What should I tell my care team before I take this medication? They need to know if you have any of these conditions: eye disease, vision problems hearing problems kidney disease low blood counts, like white cells, platelets, or red blood cells tingling of the fingers or toes, or other nerve disorder an unusual or allergic reaction to cisplatin, carboplatin, oxaliplatin, other medicines, foods, dyes, or preservatives pregnant or trying to get pregnant breast-feeding How should I use this medication? This drug is given as an infusion into a vein. It is administered in a hospitalor clinic by a specially trained health care professional. Talk to your pediatrician regarding the use of this medicine in children.Special care may be needed. Overdosage: If you think you have taken too much of this medicine contact apoison control center or emergency room at once. NOTE: This medicine is only for you. Do not share this medicine with others. What if I miss a dose? It is important not to miss a dose. Call your doctor or health careprofessional if you are unable to keep an appointment. What may interact   with this medication? This medicine may interact with the following medications: foscarnet certain antibiotics like amikacin, gentamicin, neomycin, polymyxin B, streptomycin, tobramycin, vancomycin This list may not describe all possible interactions. Give your health care provider a  list of all the medicines, herbs, non-prescription drugs, or dietary supplements you use. Also tell them if you smoke, drink alcohol, or use illegaldrugs. Some items may interact with your medicine. What should I watch for while using this medication? Your condition will be monitored carefully while you are receiving this medicine. You will need important blood work done while you are taking thismedicine. This drug may make you feel generally unwell. This is not uncommon, as chemotherapy can affect healthy cells as well as cancer cells. Report any side effects. Continue your course of treatment even though you feel ill unless yourdoctor tells you to stop. This medicine may increase your risk of getting an infection. Call your healthcare professional for advice if you get a fever, chills, or sore throat, or other symptoms of a cold or flu. Do not treat yourself. Try to avoid beingaround people who are sick. Avoid taking medicines that contain aspirin, acetaminophen, ibuprofen, naproxen, or ketoprofen unless instructed by your healthcare professional.These medicines may hide a fever. This medicine may increase your risk to bruise or bleed. Call your doctor orhealth care professional if you notice any unusual bleeding. Be careful brushing and flossing your teeth or using a toothpick because you may get an infection or bleed more easily. If you have any dental work done,tell your dentist you are receiving this medicine. Do not become pregnant while taking this medicine or for 14 months after stopping it. Women should inform their healthcare professional if they wish to become pregnant or think they might be pregnant. Men should not father a child while taking this medicine and for 11 months after stopping it. There is potential for serious side effects to an unborn child. Talk to your healthcareprofessional for more information. Do not breast-feed an infant while taking this medicine. This medicine has caused  ovarian failure in some women. This medicine may make it more difficult to get pregnant. Talk to your healthcare professional if youare concerned about your fertility. This medicine has caused decreased sperm counts in some men. This may make it more difficult to father a child. Talk to your healthcare professional if youare concerned about your fertility. Drink fluids as directed while you are taking this medicine. This will helpprotect your kidneys. Call your doctor or health care professional if you get diarrhea. Do not treatyourself. What side effects may I notice from receiving this medication? Side effects that you should report to your doctor or health care professionalas soon as possible: allergic reactions like skin rash, itching or hives, swelling of the face, lips, or tongue blurred vision changes in vision decreased hearing or ringing of the ears nausea, vomiting pain, redness, or irritation at site where injected pain, tingling, numbness in the hands or feet signs and symptoms of bleeding such as bloody or black, tarry stools; red or dark brown urine; spitting up blood or brown material that looks like coffee grounds; red spots on the skin; unusual bruising or bleeding from the eyes, gums, or nose signs and symptoms of infection like fever; chills; cough; sore throat; pain or trouble passing urine signs and symptoms of kidney injury like trouble passing urine or change in the amount of urine signs and symptoms of low red blood cells or anemia such as unusually weak or tired;   feeling faint or lightheaded; falls; breathing problems Side effects that usually do not require medical attention (report to yourdoctor or health care professional if they continue or are bothersome): loss of appetite mouth sores muscle cramps This list may not describe all possible side effects. Call your doctor for medical advice about side effects. You may report side effects to FDA at1-800-FDA-1088. Where  should I keep my medication? This drug is given in a hospital or clinic and will not be stored at home. NOTE: This sheet is a summary. It may not cover all possible information. If you have questions about this medicine, talk to your doctor, pharmacist, orhealth care provider.  2022 Elsevier/Gold Standard (2018-09-05 15:59:17)  

## 2021-03-08 ENCOUNTER — Ambulatory Visit: Payer: Medicare Other

## 2021-03-13 ENCOUNTER — Inpatient Hospital Stay: Payer: Medicare Other

## 2021-03-13 ENCOUNTER — Encounter: Payer: Self-pay | Admitting: Hematology and Oncology

## 2021-03-13 ENCOUNTER — Encounter: Payer: Self-pay | Admitting: Oncology

## 2021-03-14 ENCOUNTER — Inpatient Hospital Stay: Payer: Medicare Other

## 2021-03-15 ENCOUNTER — Ambulatory Visit: Payer: Medicare Other

## 2021-03-20 ENCOUNTER — Inpatient Hospital Stay: Payer: Medicare Other

## 2021-03-20 ENCOUNTER — Other Ambulatory Visit: Payer: Self-pay

## 2021-03-20 ENCOUNTER — Other Ambulatory Visit: Payer: Self-pay | Admitting: Pharmacist

## 2021-03-20 ENCOUNTER — Encounter: Payer: Self-pay | Admitting: Hematology and Oncology

## 2021-03-20 DIAGNOSIS — C240 Malignant neoplasm of extrahepatic bile duct: Secondary | ICD-10-CM

## 2021-03-20 LAB — HEPATIC FUNCTION PANEL
ALT: 15 (ref 10–40)
AST: 26 (ref 14–40)
Alkaline Phosphatase: 53 (ref 25–125)
Bilirubin, Total: 0.5

## 2021-03-20 LAB — BASIC METABOLIC PANEL
BUN: 13 (ref 4–21)
CO2: 25 — AB (ref 13–22)
Chloride: 104 (ref 99–108)
Creatinine: 1.2 (ref 0.6–1.3)
Glucose: 110
Potassium: 4.3 (ref 3.4–5.3)
Sodium: 139 (ref 137–147)

## 2021-03-20 LAB — CBC: RBC: 3.05 — AB (ref 3.87–5.11)

## 2021-03-20 LAB — CBC AND DIFFERENTIAL
HCT: 29 — AB (ref 41–53)
Hemoglobin: 9.6 — AB (ref 13.5–17.5)
Neutrophils Absolute: 3.66
Platelets: 103 — AB (ref 150–399)
WBC: 5.3

## 2021-03-20 LAB — COMPREHENSIVE METABOLIC PANEL
Albumin: 4 (ref 3.5–5.0)
Calcium: 8.6 — AB (ref 8.7–10.7)

## 2021-03-21 ENCOUNTER — Inpatient Hospital Stay: Payer: Medicare Other

## 2021-03-21 DIAGNOSIS — Z5111 Encounter for antineoplastic chemotherapy: Secondary | ICD-10-CM | POA: Diagnosis not present

## 2021-03-21 DIAGNOSIS — C240 Malignant neoplasm of extrahepatic bile duct: Secondary | ICD-10-CM

## 2021-03-21 MED ORDER — POTASSIUM CHLORIDE IN NACL 20-0.9 MEQ/L-% IV SOLN
Freq: Once | INTRAVENOUS | Status: AC
Start: 2021-03-21 — End: 2021-03-21

## 2021-03-21 MED ORDER — SODIUM CHLORIDE 0.9 % IV SOLN
150.0000 mg | Freq: Once | INTRAVENOUS | Status: AC
  Administered 2021-03-21: 150 mg via INTRAVENOUS
  Filled 2021-03-21: qty 150

## 2021-03-21 MED ORDER — SODIUM CHLORIDE 0.9 % IV SOLN
Freq: Once | INTRAVENOUS | Status: AC
  Filled 2021-03-21: qty 250

## 2021-03-21 MED ORDER — PALONOSETRON HCL INJECTION 0.25 MG/5ML
INTRAVENOUS | Status: AC
  Filled 2021-03-21: qty 5

## 2021-03-21 MED ORDER — SODIUM CHLORIDE 0.9% FLUSH
10.0000 mL | INTRAVENOUS | Status: DC | PRN
  Administered 2021-03-21: 10 mL
  Filled 2021-03-21: qty 10

## 2021-03-21 MED ORDER — SODIUM CHLORIDE 0.9 % IV SOLN
10.0000 mg | Freq: Once | INTRAVENOUS | Status: AC
  Administered 2021-03-21: 10 mg via INTRAVENOUS
  Filled 2021-03-21: qty 10

## 2021-03-21 MED ORDER — MAGNESIUM SULFATE 4 GM/100ML IV SOLN
4.0000 g | Freq: Once | INTRAVENOUS | Status: AC
  Administered 2021-03-21: 4 g via INTRAVENOUS

## 2021-03-21 MED ORDER — SODIUM CHLORIDE 0.9 % IV SOLN
750.0000 mg/m2 | Freq: Once | INTRAVENOUS | Status: AC
  Administered 2021-03-21: 2014 mg via INTRAVENOUS
  Filled 2021-03-21: qty 52.97

## 2021-03-21 MED ORDER — PALONOSETRON HCL INJECTION 0.25 MG/5ML
0.2500 mg | Freq: Once | INTRAVENOUS | Status: AC
  Administered 2021-03-21: 0.25 mg via INTRAVENOUS

## 2021-03-21 MED ORDER — POTASSIUM CHLORIDE IN NACL 20-0.9 MEQ/L-% IV SOLN
INTRAVENOUS | Status: AC
  Filled 2021-03-21: qty 1000

## 2021-03-21 MED ORDER — MAGNESIUM SULFATE 4 GM/100ML IV SOLN
INTRAVENOUS | Status: AC
  Filled 2021-03-21: qty 100

## 2021-03-21 MED ORDER — SODIUM CHLORIDE 0.9 % IV SOLN
18.7500 mg/m2 | Freq: Once | INTRAVENOUS | Status: AC
  Administered 2021-03-21: 50 mg via INTRAVENOUS
  Filled 2021-03-21: qty 50

## 2021-03-21 NOTE — Patient Instructions (Signed)
Gemcitabine injection What is this medication? GEMCITABINE (jem SYE ta been) is a chemotherapy drug. This medicine is used to treat many types of cancer like breast cancer, lung cancer, pancreatic cancer,and ovarian cancer. This medicine may be used for other purposes; ask your health care provider orpharmacist if you have questions. COMMON BRAND NAME(S): Gemzar, Infugem What should I tell my care team before I take this medication? They need to know if you have any of these conditions: blood disorders infection kidney disease liver disease lung or breathing disease, like asthma recent or ongoing radiation therapy an unusual or allergic reaction to gemcitabine, other chemotherapy, other medicines, foods, dyes, or preservatives pregnant or trying to get pregnant breast-feeding How should I use this medication? This drug is given as an infusion into a vein. It is administered in a hospitalor clinic by a specially trained health care professional. Talk to your pediatrician regarding the use of this medicine in children.Special care may be needed. Overdosage: If you think you have taken too much of this medicine contact apoison control center or emergency room at once. NOTE: This medicine is only for you. Do not share this medicine with others. What if I miss a dose? It is important not to miss your dose. Call your doctor or health careprofessional if you are unable to keep an appointment. What may interact with this medication? medicines to increase blood counts like filgrastim, pegfilgrastim, sargramostim some other chemotherapy drugs like cisplatin vaccines Talk to your doctor or health care professional before taking any of thesemedicines: acetaminophen aspirin ibuprofen ketoprofen naproxen This list may not describe all possible interactions. Give your health care provider a list of all the medicines, herbs, non-prescription drugs, or dietary supplements you use. Also tell them if  you smoke, drink alcohol, or use illegaldrugs. Some items may interact with your medicine. What should I watch for while using this medication? Visit your doctor for checks on your progress. This drug may make you feel generally unwell. This is not uncommon, as chemotherapy can affect healthy cells as well as cancer cells. Report any side effects. Continue your course oftreatment even though you feel ill unless your doctor tells you to stop. In some cases, you may be given additional medicines to help with side effects.Follow all directions for their use. Call your doctor or health care professional for advice if you get a fever, chills or sore throat, or other symptoms of a cold or flu. Do not treat yourself. This drug decreases your body's ability to fight infections. Try toavoid being around people who are sick. This medicine may increase your risk to bruise or bleed. Call your doctor orhealth care professional if you notice any unusual bleeding. Be careful brushing and flossing your teeth or using a toothpick because you may get an infection or bleed more easily. If you have any dental work done,tell your dentist you are receiving this medicine. Avoid taking products that contain aspirin, acetaminophen, ibuprofen, naproxen, or ketoprofen unless instructed by your doctor. These medicines may hide afever. Do not become pregnant while taking this medicine or for 6 months after stopping it. Women should inform their doctor if they wish to become pregnant or think they might be pregnant. Men should not father a child while taking this medicine and for 3 months after stopping it. There is a potential for serious side effects to an unborn child. Talk to your health care professional or pharmacist for more information. Do not breast-feed an infant while takingthis medicine or   for at least 1 week after stopping it. Men should inform their doctors if they wish to father a child. This medicine may lower sperm  counts. Talk with your doctor or health care professional ifyou are concerned about your fertility. What side effects may I notice from receiving this medication? Side effects that you should report to your doctor or health care professionalas soon as possible: allergic reactions like skin rash, itching or hives, swelling of the face, lips, or tongue breathing problems pain, redness, or irritation at site where injected signs and symptoms of a dangerous change in heartbeat or heart rhythm like chest pain; dizziness; fast or irregular heartbeat; palpitations; feeling faint or lightheaded, falls; breathing problems signs of decreased platelets or bleeding - bruising, pinpoint red spots on the skin, black, tarry stools, blood in the urine signs of decreased red blood cells - unusually weak or tired, feeling faint or lightheaded, falls signs of infection - fever or chills, cough, sore throat, pain or difficulty passing urine signs and symptoms of kidney injury like trouble passing urine or change in the amount of urine signs and symptoms of liver injury like dark yellow or brown urine; general ill feeling or flu-like symptoms; light-colored stools; loss of appetite; nausea; right upper belly pain; unusually weak or tired; yellowing of the eyes or skin swelling of ankles, feet, hands Side effects that usually do not require medical attention (report to yourdoctor or health care professional if they continue or are bothersome): constipation diarrhea hair loss loss of appetite nausea rash vomiting This list may not describe all possible side effects. Call your doctor for medical advice about side effects. You may report side effects to FDA at1-800-FDA-1088. Where should I keep my medication? This drug is given in a hospital or clinic and will not be stored at home. NOTE: This sheet is a summary. It may not cover all possible information. If you have questions about this medicine, talk to your  doctor, pharmacist, orhealth care provider.  2022 [employer]/Gold Standard (2017-12-04 18:06:11) Cisplatin injection What is this medication? CISPLATIN (SIS pla tin) is a chemotherapy drug. It targets fast dividing cells, like cancer cells, and causes these cells to die. This medicine is used totreat many types of cancer like bladder, ovarian, and testicular cancers. This medicine may be used for other purposes; ask your health care provider orpharmacist if you have questions. COMMON BRAND NAME(S): Platinol, Platinol -AQ What should I tell my care team before I take this medication? They need to know if you have any of these conditions: eye disease, vision problems hearing problems kidney disease low blood counts, like white cells, platelets, or red blood cells tingling of the fingers or toes, or other nerve disorder an unusual or allergic reaction to cisplatin, carboplatin, oxaliplatin, other medicines, foods, dyes, or preservatives pregnant or trying to get pregnant breast-feeding How should I use this medication? This drug is given as an infusion into a vein. It is administered in a hospitalor clinic by a specially trained health care professional. Talk to your pediatrician regarding the use of this medicine in children.Special care may be needed. Overdosage: If you think you have taken too much of this medicine contact apoison control center or emergency room at once. NOTE: This medicine is only for you. Do not share this medicine with others. What if I miss a dose? It is important not to miss a dose. Call your doctor or health careprofessional if you are unable to keep an appointment. What may interact   with this medication? This medicine may interact with the following medications: foscarnet certain antibiotics like amikacin, gentamicin, neomycin, polymyxin B, streptomycin, tobramycin, vancomycin This list may not describe all possible interactions. Give your health care provider a  list of all the medicines, herbs, non-prescription drugs, or dietary supplements you use. Also tell them if you smoke, drink alcohol, or use illegaldrugs. Some items may interact with your medicine. What should I watch for while using this medication? Your condition will be monitored carefully while you are receiving this medicine. You will need important blood work done while you are taking thismedicine. This drug may make you feel generally unwell. This is not uncommon, as chemotherapy can affect healthy cells as well as cancer cells. Report any side effects. Continue your course of treatment even though you feel ill unless yourdoctor tells you to stop. This medicine may increase your risk of getting an infection. Call your healthcare professional for advice if you get a fever, chills, or sore throat, or other symptoms of a cold or flu. Do not treat yourself. Try to avoid beingaround people who are sick. Avoid taking medicines that contain aspirin, acetaminophen, ibuprofen, naproxen, or ketoprofen unless instructed by your healthcare professional.These medicines may hide a fever. This medicine may increase your risk to bruise or bleed. Call your doctor orhealth care professional if you notice any unusual bleeding. Be careful brushing and flossing your teeth or using a toothpick because you may get an infection or bleed more easily. If you have any dental work done,tell your dentist you are receiving this medicine. Do not become pregnant while taking this medicine or for 14 months after stopping it. Women should inform their healthcare professional if they wish to become pregnant or think they might be pregnant. Men should not father a child while taking this medicine and for 11 months after stopping it. There is potential for serious side effects to an unborn child. Talk to your healthcareprofessional for more information. Do not breast-feed an infant while taking this medicine. This medicine has caused  ovarian failure in some women. This medicine may make it more difficult to get pregnant. Talk to your healthcare professional if youare concerned about your fertility. This medicine has caused decreased sperm counts in some men. This may make it more difficult to father a child. Talk to your healthcare professional if youare concerned about your fertility. Drink fluids as directed while you are taking this medicine. This will helpprotect your kidneys. Call your doctor or health care professional if you get diarrhea. Do not treatyourself. What side effects may I notice from receiving this medication? Side effects that you should report to your doctor or health care professionalas soon as possible: allergic reactions like skin rash, itching or hives, swelling of the face, lips, or tongue blurred vision changes in vision decreased hearing or ringing of the ears nausea, vomiting pain, redness, or irritation at site where injected pain, tingling, numbness in the hands or feet signs and symptoms of bleeding such as bloody or black, tarry stools; red or dark brown urine; spitting up blood or brown material that looks like coffee grounds; red spots on the skin; unusual bruising or bleeding from the eyes, gums, or nose signs and symptoms of infection like fever; chills; cough; sore throat; pain or trouble passing urine signs and symptoms of kidney injury like trouble passing urine or change in the amount of urine signs and symptoms of low red blood cells or anemia such as unusually weak or tired;   feeling faint or lightheaded; falls; breathing problems Side effects that usually do not require medical attention (report to yourdoctor or health care professional if they continue or are bothersome): loss of appetite mouth sores muscle cramps This list may not describe all possible side effects. Call your doctor for medical advice about side effects. You may report side effects to FDA at1-800-FDA-1088. Where  should I keep my medication? This drug is given in a hospital or clinic and will not be stored at home. NOTE: This sheet is a summary. It may not cover all possible information. If you have questions about this medicine, talk to your doctor, pharmacist, orhealth care provider.  2022 Elsevier/Gold Standard (2018-09-05 15:59:17)  

## 2021-03-22 ENCOUNTER — Encounter: Payer: Self-pay | Admitting: Oncology

## 2021-04-02 NOTE — Progress Notes (Signed)
[facility]  [address] [place],  [place]  [address] [phone]  Clinic Day: 04/03/2021  Referring physician: Bonnita Nasuti, MD  This document serves as a record of services personally performed by [name], MD. It was created on their behalf by Curry,Lauren E, a trained medical scribe. The creation of this record is based on the scribe's personal observations and the provider's statements to them.  HISTORY OF PRESENT ILLNESS:  The patient is a 80 y.o. 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  He comes in today to be evaluated before heading into his 8th cycle of cisplatin/gemcitabine, which he receives weekly, every 2/3 weeks..  The patient claims to have tolerated his last cycle of treatment fairly well.  He claims his appetite has dropped since receiving chemotherapy. Otherwise, he claims to be doing well.  He denies abdominal pain, distention or other findings which concern him for overt signs of disease progression.    PHYSICAL EXAM:  Blood pressure 130/65, pulse 87, temperature 98.2 F (36.8 C), temperature source Oral, resp. rate 18, height _0  (1.88 m), weight 275 lb 1.6 oz (124.8 kg), SpO2 94 %. Wt Readings from Last 3 Encounters:  04/03/21 275 lb 1.6 oz (124.8 kg)  03/07/21 293 lb (132.9 kg)  03/06/21 294 lb 1.6 oz (133.4 kg)   Body mass index is 35.32 kg/m. Performance status (ECOG): 1 - Symptomatic but completely ambulatory Physical Exam Constitutional:      Appearance: Normal appearance. He is not ill-appearing.  HENT:     Mouth/Throat:     Mouth: Mucous membranes are moist.     Pharynx: Oropharynx is clear. No oropharyngeal exudate or posterior oropharyngeal erythema.  Cardiovascular:     Rate and Rhythm: Normal rate and regular rhythm.     Heart sounds: No murmur heard.   No friction rub. No  gallop.  Pulmonary:     Effort: Pulmonary effort is normal. No respiratory distress.     Breath sounds: Normal breath sounds. No wheezing, rhonchi or rales.  Chest:  Breasts:    Right: No axillary adenopathy or supraclavicular adenopathy.     Left: No axillary adenopathy or supraclavicular adenopathy.  Abdominal:     General: Bowel sounds are normal. There is no distension.     Palpations: Abdomen is soft. There is no mass (no palpable abdominal passes are appreciated).     Tenderness: There is no abdominal tenderness.  Musculoskeletal:        General: No swelling.     Right lower leg: No edema.     Left lower leg: No edema.  Lymphadenopathy:     Cervical: No cervical adenopathy.     Upper Body:     Right upper body: No supraclavicular or axillary adenopathy.     Left upper body: No supraclavicular or axillary adenopathy.     Lower Body: No right inguinal adenopathy. No left inguinal adenopathy.  Skin:    General: Skin is warm.     Coloration: Skin is not jaundiced.     Findings: No lesion or rash.  Neurological:     General: No focal deficit present.     Mental Status: He is alert and oriented to person, place, and time. Mental status is at baseline.     Cranial Nerves: Cranial nerves are intact.  Psychiatric:        Mood and Affect:  Mood normal.        Thought Content: Thought content normal.    LABS:    Ref. Range 04/03/2021 00:00  Sodium Latest Ref Range: 137 - 147  140  Potassium Latest Ref Range: 3.4 - 5.3  4.6  Chloride Latest Ref Range: 99 - 108  105  CO2 Latest Ref Range: 13 - 22  24 (A)  Glucose Unknown 120  BUN Latest Ref Range: 4 - 21  16  Creatinine Latest Ref Range: 0.6 - 1.3  1.2  Calcium Latest Ref Range: 8.7 - 10.7  8.8  Magnesium Latest Ref Range: 1.6 - 2.3  1.5 (A)  Alkaline Phosphatase Latest Ref Range: 25 - 125  53  Albumin Latest Ref Range: 3.5 - 5.0  4.2  AST Latest Ref Range: 14 - 40  25  ALT Latest Ref Range: 10 - 40  15  Bilirubin, Total  Unknown 0.4  WBC Unknown 4.0  RBC Latest Ref Range: 3.87 - 5.11  3.21 (A)  Hemoglobin Latest Ref Range: 13.5 - 17.5  10.0 (A)  HCT Latest Ref Range: 41 - 53  31 (A)  MCV Latest Ref Range: 80 - 94  96 (A)  Platelets Latest Ref Range: 150 - 399  89 (A)  NEUT# Unknown 2.64    ASSESSMENT & PLAN:  Assessment/Plan:  A 80 y.o. male with metastatic FGFR2 mutation positive cholangiocarcinoma.  He will proceed with his 8th cycle of cisplatin/gemcitiabine this week.  His treatments will remain spaced out to where he will be on a day 1,15 regimen, with each cycle being spaced out to every 4 weeks.  This is being done to give his bone marrow more time to reproduce blood cells before he scheduled for a subsequent treatment.  I will see him back in 4 weeks before he heads into a 9th potential cycle of chemotherapy.  A repeat abdominal MRI will be done before his next visit to assess his new disease baseline.  If his disease is stable or improved, I will discontinue his cisplatin and continue him on maintenance gemcitabine.  If he has disease progression, then I would place him on targeted therapy focused on his FGFR2 mutation to treat his disease.  The patient understands all the plans discussed today and is in agreement with them.    I, Rita Ohara, am acting as scribe for [name], MD    I have reviewed this report as typed by the medical scribe, and it is complete and accurate.  Dsean Vantol Macarthur Critchley, MD

## 2021-04-03 ENCOUNTER — Other Ambulatory Visit: Payer: Self-pay | Admitting: Hematology and Oncology

## 2021-04-03 ENCOUNTER — Inpatient Hospital Stay: Payer: Medicare Other | Attending: Oncology

## 2021-04-03 ENCOUNTER — Other Ambulatory Visit: Payer: Self-pay

## 2021-04-03 ENCOUNTER — Telehealth: Payer: Self-pay | Admitting: Oncology

## 2021-04-03 ENCOUNTER — Other Ambulatory Visit: Payer: Self-pay | Admitting: Oncology

## 2021-04-03 ENCOUNTER — Inpatient Hospital Stay (INDEPENDENT_AMBULATORY_CARE_PROVIDER_SITE_OTHER): Payer: Medicare Other | Admitting: Oncology

## 2021-04-03 ENCOUNTER — Encounter: Payer: Self-pay | Admitting: Oncology

## 2021-04-03 ENCOUNTER — Other Ambulatory Visit: Payer: Self-pay | Admitting: Pharmacist

## 2021-04-03 VITALS — BP 130/65 | HR 87 | Temp 98.2°F | Resp 18 | Ht 74.0 in | Wt 275.1 lb

## 2021-04-03 DIAGNOSIS — C240 Malignant neoplasm of extrahepatic bile duct: Secondary | ICD-10-CM

## 2021-04-03 DIAGNOSIS — C221 Intrahepatic bile duct carcinoma: Secondary | ICD-10-CM

## 2021-04-03 DIAGNOSIS — C786 Secondary malignant neoplasm of retroperitoneum and peritoneum: Secondary | ICD-10-CM | POA: Diagnosis not present

## 2021-04-03 DIAGNOSIS — Z5111 Encounter for antineoplastic chemotherapy: Secondary | ICD-10-CM | POA: Insufficient documentation

## 2021-04-03 DIAGNOSIS — Z79899 Other long term (current) drug therapy: Secondary | ICD-10-CM | POA: Insufficient documentation

## 2021-04-03 LAB — BASIC METABOLIC PANEL
BUN: 16 (ref 4–21)
CO2: 24 — AB (ref 13–22)
Chloride: 105 (ref 99–108)
Creatinine: 1.2 (ref 0.6–1.3)
Glucose: 120
Potassium: 4.6 (ref 3.4–5.3)
Sodium: 140 (ref 137–147)

## 2021-04-03 LAB — HEPATIC FUNCTION PANEL
ALT: 15 (ref 10–40)
AST: 25 (ref 14–40)
Alkaline Phosphatase: 53 (ref 25–125)
Bilirubin, Total: 0.4

## 2021-04-03 LAB — CBC AND DIFFERENTIAL
HCT: 31 — AB (ref 41–53)
Hemoglobin: 10 — AB (ref 13.5–17.5)
Neutrophils Absolute: 2.64
Platelets: 89 — AB (ref 150–399)
WBC: 4

## 2021-04-03 LAB — CBC
Absolute Lymphocytes: 0.56 — AB (ref 0.65–4.75)
MCV: 96 — AB (ref 80–94)
RBC: 3.21 — AB (ref 3.87–5.11)

## 2021-04-03 LAB — MAGNESIUM: Magnesium: 1.5 — AB (ref 1.6–2.3)

## 2021-04-03 LAB — COMPREHENSIVE METABOLIC PANEL
Albumin: 4.2 (ref 3.5–5.0)
Calcium: 8.8 (ref 8.7–10.7)

## 2021-04-03 NOTE — Telephone Encounter (Signed)
Per 7/11 LOS, patient scheduled for 8/1 Follow Up - Will order Labs, MRI.  Gave patient Appt Summary

## 2021-04-03 NOTE — Progress Notes (Signed)
Ok to proceed with chemo despite plt=89,000 per Dr. Bobby Rumpf.

## 2021-04-04 ENCOUNTER — Inpatient Hospital Stay: Payer: Medicare Other

## 2021-04-04 VITALS — BP 123/73 | HR 75 | Temp 98.0°F | Resp 18 | Ht 74.0 in | Wt 274.1 lb

## 2021-04-04 DIAGNOSIS — C240 Malignant neoplasm of extrahepatic bile duct: Secondary | ICD-10-CM

## 2021-04-04 DIAGNOSIS — C221 Intrahepatic bile duct carcinoma: Secondary | ICD-10-CM | POA: Diagnosis present

## 2021-04-04 DIAGNOSIS — Z5111 Encounter for antineoplastic chemotherapy: Secondary | ICD-10-CM | POA: Diagnosis present

## 2021-04-04 DIAGNOSIS — C786 Secondary malignant neoplasm of retroperitoneum and peritoneum: Secondary | ICD-10-CM | POA: Diagnosis not present

## 2021-04-04 DIAGNOSIS — Z79899 Other long term (current) drug therapy: Secondary | ICD-10-CM | POA: Diagnosis not present

## 2021-04-04 MED ORDER — POTASSIUM CHLORIDE IN NACL 20-0.9 MEQ/L-% IV SOLN
Freq: Once | INTRAVENOUS | Status: AC
Start: 2021-04-04 — End: 2021-04-04

## 2021-04-04 MED ORDER — MAGNESIUM SULFATE 4 GM/100ML IV SOLN
4.0000 g | Freq: Once | INTRAVENOUS | Status: AC
  Administered 2021-04-04: 4 g via INTRAVENOUS

## 2021-04-04 MED ORDER — POTASSIUM CHLORIDE IN NACL 20-0.9 MEQ/L-% IV SOLN
INTRAVENOUS | Status: AC
  Filled 2021-04-04: qty 1000

## 2021-04-04 MED ORDER — SODIUM CHLORIDE 0.9 % IV SOLN
18.7500 mg/m2 | Freq: Once | INTRAVENOUS | Status: AC
  Administered 2021-04-04: 50 mg via INTRAVENOUS
  Filled 2021-04-04: qty 20

## 2021-04-04 MED ORDER — PALONOSETRON HCL INJECTION 0.25 MG/5ML
INTRAVENOUS | Status: AC
  Filled 2021-04-04: qty 5

## 2021-04-04 MED ORDER — PALONOSETRON HCL INJECTION 0.25 MG/5ML
0.2500 mg | Freq: Once | INTRAVENOUS | Status: AC
Start: 2021-04-04 — End: 2021-04-04
  Administered 2021-04-04: 0.25 mg via INTRAVENOUS

## 2021-04-04 MED ORDER — SODIUM CHLORIDE 0.9 % IV SOLN
10.0000 mg | Freq: Once | INTRAVENOUS | Status: AC
  Administered 2021-04-04: 10 mg via INTRAVENOUS
  Filled 2021-04-04: qty 10

## 2021-04-04 MED ORDER — SODIUM CHLORIDE 0.9 % IV SOLN
Freq: Once | INTRAVENOUS | Status: AC
  Filled 2021-04-04: qty 250

## 2021-04-04 MED ORDER — SODIUM CHLORIDE 0.9 % IV SOLN
750.0000 mg/m2 | Freq: Once | INTRAVENOUS | Status: AC
  Administered 2021-04-04: 2014 mg via INTRAVENOUS
  Filled 2021-04-04: qty 52.97

## 2021-04-04 MED ORDER — SODIUM CHLORIDE 0.9 % IV SOLN
150.0000 mg | Freq: Once | INTRAVENOUS | Status: AC
  Administered 2021-04-04: 150 mg via INTRAVENOUS
  Filled 2021-04-04: qty 150

## 2021-04-04 MED ORDER — SODIUM CHLORIDE 0.9 % IV SOLN
Freq: Once | INTRAVENOUS | Status: DC
  Filled 2021-04-04: qty 250

## 2021-04-04 MED ORDER — MAGNESIUM SULFATE 4 GM/100ML IV SOLN
INTRAVENOUS | Status: AC
  Filled 2021-04-04: qty 100

## 2021-04-04 NOTE — Patient Instructions (Signed)
[facility]  Discharge Instructions: Thank you for choosing [facility] to provide your oncology and hematology care.  If you have a lab appointment with the [facility], please go directly to the [facility] and check in at the registration area.   Wear comfortable clothing and clothing appropriate for easy access to any Portacath or PICC line.   We strive to give you quality time with your provider. You may need to reschedule your appointment if you arrive late (15 or more minutes).  Arriving late affects you and other patients whose appointments are after yours.  Also, if you miss three or more appointments without notifying the office, you may be dismissed from the clinic at the provider's discretion.      For prescription refill requests, have your pharmacy contact our office and allow 72 hours for refills to be completed.    Today you received the following chemotherapy and/or immunotherapy agents gemzar/cisplatin   To help prevent nausea and vomiting after your treatment, we encourage you to take your nausea medication as directed.  BELOW ARE SYMPTOMS THAT SHOULD BE REPORTED IMMEDIATELY: *FEVER GREATER THAN 100.4 F (38 C) OR HIGHER *CHILLS OR SWEATING *NAUSEA AND VOMITING THAT IS NOT CONTROLLED WITH YOUR NAUSEA MEDICATION *UNUSUAL SHORTNESS OF BREATH *UNUSUAL BRUISING OR BLEEDING *URINARY PROBLEMS (pain or burning when urinating, or frequent urination) *BOWEL PROBLEMS (unusual diarrhea, constipation, pain near the anus) TENDERNESS IN MOUTH AND THROAT WITH OR WITHOUT PRESENCE OF ULCERS (sore throat, sores in mouth, or a toothache) UNUSUAL RASH, SWELLING OR PAIN  UNUSUAL VAGINAL DISCHARGE OR ITCHING   Items with * indicate a potential emergency and should be followed up as soon as possible or go to the Emergency Department if any problems should occur.  Please show the CHEMOTHERAPY ALERT CARD or IMMUNOTHERAPY ALERT CARD at check-in to the  Emergency Department and triage nurse.  Should you have questions after your visit or need to cancel or reschedule your appointment, please contact [facility]  Dept: [phone]  and follow the prompts.  Office hours are 8:00 a.m. to 4:30 p.m. Monday - Friday. Please note that voicemails left after 4:00 p.m. may not be returned until the following business day.  We are closed weekends and major holidays. You have access to a nurse at all times for urgent questions. Please call the main number to the clinic Dept: [phone] and follow the prompts.  For any non-urgent questions, you may also contact your provider using MyChart. We now offer e-Visits for anyone 30 and older to request care online for non-urgent symptoms. For details visit [email or web address].   Also download the MyChart app! Go to the app store, search "MyChart", open the app, select [facility], and log in with your MyChart username and password.  Due to Covid, a mask is required upon entering the hospital/clinic. If you do not have a mask, one will be given to you upon arrival. For doctor visits, patients may have 1 support person aged 40 or older with them. For treatment visits, patients cannot have anyone with them due to current Covid guidelines and our immunocompromised population.   Cisplatin injection What is this medication? CISPLATIN (SIS pla tin) is a chemotherapy drug. It targets fast dividing cells, like cancer cells, and causes these cells to die. This medicine is used totreat many types of cancer like bladder, ovarian, and testicular cancers. This medicine may be used for other purposes; ask your health  care provider orpharmacist if you have questions. COMMON BRAND NAME(S): Platinol, Platinol -AQ What should I tell my care team before I take this medication? They need to know if you have any of these conditions: eye disease, vision problems hearing problems kidney disease low blood  counts, like white cells, platelets, or red blood cells tingling of the fingers or toes, or other nerve disorder an unusual or allergic reaction to cisplatin, carboplatin, oxaliplatin, other medicines, foods, dyes, or preservatives pregnant or trying to get pregnant breast-feeding How should I use this medication? This drug is given as an infusion into a vein. It is administered in a hospitalor clinic by a specially trained health care professional. Talk to your pediatrician regarding the use of this medicine in children.Special care may be needed. Overdosage: If you think you have taken too much of this medicine contact apoison control center or emergency room at once. NOTE: This medicine is only for you. Do not share this medicine with others. What if I miss a dose? It is important not to miss a dose. Call your doctor or health careprofessional if you are unable to keep an appointment. What may interact with this medication? This medicine may interact with the following medications: foscarnet certain antibiotics like amikacin, gentamicin, neomycin, polymyxin B, streptomycin, tobramycin, vancomycin This list may not describe all possible interactions. Give your health care provider a list of all the medicines, herbs, non-prescription drugs, or dietary supplements you use. Also tell them if you smoke, drink alcohol, or use illegaldrugs. Some items may interact with your medicine. What should I watch for while using this medication? Your condition will be monitored carefully while you are receiving this medicine. You will need important blood work done while you are taking thismedicine. This drug may make you feel generally unwell. This is not uncommon, as chemotherapy can affect healthy cells as well as cancer cells. Report any side effects. Continue your course of treatment even though you feel ill unless yourdoctor tells you to stop. This medicine may increase your risk of getting an infection.  Call your healthcare professional for advice if you get a fever, chills, or sore throat, or other symptoms of a cold or flu. Do not treat yourself. Try to avoid beingaround people who are sick. Avoid taking medicines that contain aspirin, acetaminophen, ibuprofen, naproxen, or ketoprofen unless instructed by your healthcare professional.These medicines may hide a fever. This medicine may increase your risk to bruise or bleed. Call your doctor orhealth care professional if you notice any unusual bleeding. Be careful brushing and flossing your teeth or using a toothpick because you may get an infection or bleed more easily. If you have any dental work done,tell your dentist you are receiving this medicine. Do not become pregnant while taking this medicine or for 14 months after stopping it. Women should inform their healthcare professional if they wish to become pregnant or think they might be pregnant. Men should not father a child while taking this medicine and for 11 months after stopping it. There is potential for serious side effects to an unborn child. Talk to your healthcareprofessional for more information. Do not breast-feed an infant while taking this medicine. This medicine has caused ovarian failure in some women. This medicine may make it more difficult to get pregnant. Talk to your healthcare professional if [place] concerned about your fertility. This medicine has caused decreased sperm counts in some men. This may make it more difficult to father a child. Talk to your healthcare  professional if youare concerned about your fertility. Drink fluids as directed while you are taking this medicine. This will helpprotect your kidneys. Call your doctor or health care professional if you get diarrhea. Do not treatyourself. What side effects may I notice from receiving this medication? Side effects that you should report to your doctor or health care professionalas soon as possible: allergic reactions  like skin rash, itching or hives, swelling of the face, lips, or tongue blurred vision changes in vision decreased hearing or ringing of the ears nausea, vomiting pain, redness, or irritation at site where injected pain, tingling, numbness in the hands or feet signs and symptoms of bleeding such as bloody or black, tarry stools; red or dark brown urine; spitting up blood or brown material that looks like coffee grounds; red spots on the skin; unusual bruising or bleeding from the eyes, gums, or nose signs and symptoms of infection like fever; chills; cough; sore throat; pain or trouble passing urine signs and symptoms of kidney injury like trouble passing urine or change in the amount of urine signs and symptoms of low red blood cells or anemia such as unusually weak or tired; feeling faint or lightheaded; falls; breathing problems Side effects that usually do not require medical attention (report to yourdoctor or health care professional if they continue or are bothersome): loss of appetite mouth sores muscle cramps This list may not describe all possible side effects. Call your doctor for medical advice about side effects. You may report side effects to FDA at1-800-FDA-1088. Where should I keep my medication? This drug is given in a hospital or clinic and will not be stored at home. NOTE: This sheet is a summary. It may not cover all possible information. If you have questions about this medicine, talk to your doctor, pharmacist, orhealth care provider.  2022 Elsevier/Gold Standard (2018-09-05 15:59:17) Gemcitabine injection What is this medication? GEMCITABINE (jem SYE ta been) is a chemotherapy drug. This medicine is used to treat many types of cancer like breast cancer, lung cancer, pancreatic cancer,and ovarian cancer. This medicine may be used for other purposes; ask your health care provider orpharmacist if you have questions. COMMON BRAND NAME(S): Gemzar, Infugem What should I tell  my care team before I take this medication? They need to know if you have any of these conditions: blood disorders infection kidney disease liver disease lung or breathing disease, like asthma recent or ongoing radiation therapy an unusual or allergic reaction to gemcitabine, other chemotherapy, other medicines, foods, dyes, or preservatives pregnant or trying to get pregnant breast-feeding How should I use this medication? This drug is given as an infusion into a vein. It is administered in a hospitalor clinic by a specially trained health care professional. Talk to your pediatrician regarding the use of this medicine in children.Special care may be needed. Overdosage: If you think you have taken too much of this medicine contact apoison control center or emergency room at once. NOTE: This medicine is only for you. Do not share this medicine with others. What if I miss a dose? It is important not to miss your dose. Call your doctor or health careprofessional if you are unable to keep an appointment. What may interact with this medication? medicines to increase blood counts like filgrastim, pegfilgrastim, sargramostim some other chemotherapy drugs like cisplatin vaccines Talk to your doctor or health care professional before taking any of thesemedicines: acetaminophen aspirin ibuprofen ketoprofen naproxen This list may not describe all possible interactions. Give your health care  provider a list of all the medicines, herbs, non-prescription drugs, or dietary supplements you use. Also tell them if you smoke, drink alcohol, or use illegaldrugs. Some items may interact with your medicine. What should I watch for while using this medication? Visit your doctor for checks on your progress. This drug may make you feel generally unwell. This is not uncommon, as chemotherapy can affect healthy cells as well as cancer cells. Report any side effects. Continue your course oftreatment even though  you feel ill unless your doctor tells you to stop. In some cases, you may be given additional medicines to help with side effects.Follow all directions for their use. Call your doctor or health care professional for advice if you get a fever, chills or sore throat, or other symptoms of a cold or flu. Do not treat yourself. This drug decreases your body's ability to fight infections. Try toavoid being around people who are sick. This medicine may increase your risk to bruise or bleed. Call your doctor orhealth care professional if you notice any unusual bleeding. Be careful brushing and flossing your teeth or using a toothpick because you may get an infection or bleed more easily. If you have any dental work done,tell your dentist you are receiving this medicine. Avoid taking products that contain aspirin, acetaminophen, ibuprofen, naproxen, or ketoprofen unless instructed by your doctor. These medicines may hide afever. Do not become pregnant while taking this medicine or for 6 months after stopping it. Women should inform their doctor if they wish to become pregnant or think they might be pregnant. Men should not father a child while taking this medicine and for 3 months after stopping it. There is a potential for serious side effects to an unborn child. Talk to your health care professional or pharmacist for more information. Do not breast-feed an infant while takingthis medicine or for at least 1 week after stopping it. Men should inform their doctors if they wish to father a child. This medicine may lower sperm counts. Talk with your doctor or health care professional ifyou are concerned about your fertility. What side effects may I notice from receiving this medication? Side effects that you should report to your doctor or health care professionalas soon as possible: allergic reactions like skin rash, itching or hives, swelling of the face, lips, or tongue breathing problems pain, redness, or  irritation at site where injected signs and symptoms of a dangerous change in heartbeat or heart rhythm like chest pain; dizziness; fast or irregular heartbeat; palpitations; feeling faint or lightheaded, falls; breathing problems signs of decreased platelets or bleeding - bruising, pinpoint red spots on the skin, black, tarry stools, blood in the urine signs of decreased red blood cells - unusually weak or tired, feeling faint or lightheaded, falls signs of infection - fever or chills, cough, sore throat, pain or difficulty passing urine signs and symptoms of kidney injury like trouble passing urine or change in the amount of urine signs and symptoms of liver injury like dark yellow or brown urine; general ill feeling or flu-like symptoms; light-colored stools; loss of appetite; nausea; right upper belly pain; unusually weak or tired; yellowing of the eyes or skin swelling of ankles, feet, hands Side effects that usually do not require medical attention (report to yourdoctor or health care professional if they continue or are bothersome): constipation diarrhea hair loss loss of appetite nausea rash vomiting This list may not describe all possible side effects. Call your doctor for medical advice about side  effects. You may report side effects to FDA at1-800-FDA-1088. Where should I keep my medication? This drug is given in a hospital or clinic and will not be stored at home. NOTE: This sheet is a summary. It may not cover all possible information. If you have questions about this medicine, talk to your doctor, pharmacist, orhealth care provider.  2022 Elsevier/Gold Standard (2017-12-04 18:06:11)

## 2021-04-07 ENCOUNTER — Encounter: Payer: Self-pay | Admitting: Oncology

## 2021-04-10 ENCOUNTER — Other Ambulatory Visit: Payer: Medicare Other

## 2021-04-11 ENCOUNTER — Ambulatory Visit: Payer: Medicare Other

## 2021-04-17 ENCOUNTER — Other Ambulatory Visit: Payer: Self-pay

## 2021-04-17 ENCOUNTER — Inpatient Hospital Stay: Payer: Medicare Other

## 2021-04-17 ENCOUNTER — Encounter: Payer: Self-pay | Admitting: Hematology and Oncology

## 2021-04-17 ENCOUNTER — Other Ambulatory Visit: Payer: Self-pay | Admitting: Hematology and Oncology

## 2021-04-17 DIAGNOSIS — C240 Malignant neoplasm of extrahepatic bile duct: Secondary | ICD-10-CM

## 2021-04-17 LAB — BASIC METABOLIC PANEL
BUN: 22 — AB (ref 4–21)
CO2: 27 — AB (ref 13–22)
Chloride: 103 (ref 99–108)
Creatinine: 1.2 (ref 0.6–1.3)
Glucose: 121
Potassium: 4.2 (ref 3.4–5.3)
Sodium: 139 (ref 137–147)

## 2021-04-17 LAB — CBC: RBC: 2.96 — AB (ref 3.87–5.11)

## 2021-04-17 LAB — CBC AND DIFFERENTIAL
HCT: 28 — AB (ref 41–53)
Hemoglobin: 9.3 — AB (ref 13.5–17.5)
Neutrophils Absolute: 3.37
Platelets: 88 — AB (ref 150–399)
WBC: 5.1

## 2021-04-17 LAB — HEPATIC FUNCTION PANEL
ALT: 16 (ref 10–40)
AST: 23 (ref 14–40)
Alkaline Phosphatase: 54 (ref 25–125)
Bilirubin, Total: 0.6

## 2021-04-17 LAB — COMPREHENSIVE METABOLIC PANEL
Albumin: 4.1 (ref 3.5–5.0)
Calcium: 9 (ref 8.7–10.7)

## 2021-04-18 ENCOUNTER — Inpatient Hospital Stay: Payer: Medicare Other

## 2021-04-18 ENCOUNTER — Other Ambulatory Visit: Payer: Self-pay | Admitting: Adult Health

## 2021-04-18 VITALS — BP 124/67 | HR 85 | Temp 97.8°F | Resp 18 | Wt 271.0 lb

## 2021-04-18 DIAGNOSIS — C221 Intrahepatic bile duct carcinoma: Secondary | ICD-10-CM

## 2021-04-18 DIAGNOSIS — Z5111 Encounter for antineoplastic chemotherapy: Secondary | ICD-10-CM | POA: Diagnosis not present

## 2021-04-18 DIAGNOSIS — C240 Malignant neoplasm of extrahepatic bile duct: Secondary | ICD-10-CM

## 2021-04-18 MED ORDER — MAGNESIUM SULFATE 2 GM/50ML IV SOLN
6.0000 g | Freq: Once | INTRAVENOUS | Status: AC
  Administered 2021-04-18: 6 g via INTRAVENOUS

## 2021-04-18 MED ORDER — FUROSEMIDE 10 MG/ML IJ SOLN: 20.0000 mg | Freq: Once | INTRAMUSCULAR | Status: DC

## 2021-04-18 MED ORDER — POTASSIUM CHLORIDE IN NACL 20-0.9 MEQ/L-% IV SOLN
INTRAVENOUS | Status: AC
  Filled 2021-04-18: qty 1000

## 2021-04-18 MED ORDER — POTASSIUM CHLORIDE IN NACL 20-0.9 MEQ/L-% IV SOLN
Freq: Once | INTRAVENOUS | Status: AC
Start: 2021-04-18 — End: 2021-04-18

## 2021-04-18 MED ORDER — PALONOSETRON HCL INJECTION 0.25 MG/5ML
0.2500 mg | Freq: Once | INTRAVENOUS | Status: AC
Start: 2021-04-18 — End: 2021-04-18
  Administered 2021-04-18: 0.25 mg via INTRAVENOUS

## 2021-04-18 MED ORDER — SODIUM CHLORIDE 0.9 % IV SOLN
750.0000 mg/m2 | Freq: Once | INTRAVENOUS | Status: AC
  Administered 2021-04-18: 2014 mg via INTRAVENOUS
  Filled 2021-04-18: qty 52.97

## 2021-04-18 MED ORDER — CISPLATIN CHEMO INJECTION 100MG/100ML
18.7500 mg/m2 | Freq: Once | INTRAVENOUS | Status: AC
  Administered 2021-04-18: 50 mg via INTRAVENOUS
  Filled 2021-04-18: qty 50

## 2021-04-18 MED ORDER — SODIUM CHLORIDE 0.9 % IV SOLN
10.0000 mg | Freq: Once | INTRAVENOUS | Status: AC
  Administered 2021-04-18: 10 mg via INTRAVENOUS
  Filled 2021-04-18: qty 10

## 2021-04-18 MED ORDER — MAGNESIUM SULFATE 2 GM/50ML IV SOLN
INTRAVENOUS | Status: AC
  Filled 2021-04-18: qty 50

## 2021-04-18 MED ORDER — SODIUM CHLORIDE 0.9% FLUSH
10.0000 mL | INTRAVENOUS | Status: DC | PRN
  Administered 2021-04-18: 10 mL
  Filled 2021-04-18: qty 10

## 2021-04-18 MED ORDER — FUROSEMIDE 10 MG/ML IJ SOLN
20.0000 mg | Freq: Once | INTRAMUSCULAR | Status: AC
  Administered 2021-04-18: 20 mg via INTRAVENOUS

## 2021-04-18 MED ORDER — PALONOSETRON HCL INJECTION 0.25 MG/5ML
INTRAVENOUS | Status: AC
  Filled 2021-04-18: qty 5

## 2021-04-18 MED ORDER — SODIUM CHLORIDE 0.9 % IV SOLN
Freq: Once | INTRAVENOUS | Status: AC
  Filled 2021-04-18: qty 250

## 2021-04-18 MED ORDER — SODIUM CHLORIDE 0.9 % IV SOLN
150.0000 mg | Freq: Once | INTRAVENOUS | Status: AC
  Administered 2021-04-18: 150 mg via INTRAVENOUS
  Filled 2021-04-18: qty 150

## 2021-04-18 MED ORDER — MAGNESIUM SULFATE 4 GM/100ML IV SOLN
INTRAVENOUS | Status: AC
  Filled 2021-04-18: qty 100

## 2021-04-18 MED ORDER — FUROSEMIDE 10 MG/ML IJ SOLN
INTRAMUSCULAR | Status: AC
  Filled 2021-04-18: qty 2

## 2021-04-18 NOTE — Progress Notes (Unsigned)
PT STABLE AT TIME OF DISCHARGE 

## 2021-04-18 NOTE — Progress Notes (Unsigned)
Discharge home, stable 

## 2021-04-18 NOTE — Patient Instructions (Signed)
Cisplatin injection What is this medication? CISPLATIN (SIS pla tin) is a chemotherapy drug. It targets fast dividing cells, like cancer cells, and causes these cells to die. This medicine is used totreat many types of cancer like bladder, ovarian, and testicular cancers. This medicine may be used for other purposes; ask your health care provider orpharmacist if you have questions. COMMON BRAND NAME(S): Platinol, Platinol -AQ What should I tell my care team before I take this medication? They need to know if you have any of these conditions: eye disease, vision problems hearing problems kidney disease low blood counts, like white cells, platelets, or red blood cells tingling of the fingers or toes, or other nerve disorder an unusual or allergic reaction to cisplatin, carboplatin, oxaliplatin, other medicines, foods, dyes, or preservatives pregnant or trying to get pregnant breast-feeding How should I use this medication? This drug is given as an infusion into a vein. It is administered in a hospitalor clinic by a specially trained health care professional. Talk to your pediatrician regarding the use of this medicine in children.Special care may be needed. Overdosage: If you think you have taken too much of this medicine contact apoison control center or emergency room at once. NOTE: This medicine is only for you. Do not share this medicine with others. What if I miss a dose? It is important not to miss a dose. Call your doctor or health careprofessional if you are unable to keep an appointment. What may interact with this medication? This medicine may interact with the following medications: foscarnet certain antibiotics like amikacin, gentamicin, neomycin, polymyxin B, streptomycin, tobramycin, vancomycin This list may not describe all possible interactions. Give your health care provider a list of all the medicines, herbs, non-prescription drugs, or dietary supplements you use. Also tell  them if you smoke, drink alcohol, or use illegaldrugs. Some items may interact with your medicine. What should I watch for while using this medication? Your condition will be monitored carefully while you are receiving this medicine. You will need important blood work done while you are taking thismedicine. This drug may make you feel generally unwell. This is not uncommon, as chemotherapy can affect healthy cells as well as cancer cells. Report any side effects. Continue your course of treatment even though you feel ill unless yourdoctor tells you to stop. This medicine may increase your risk of getting an infection. Call your healthcare professional for advice if you get a fever, chills, or sore throat, or other symptoms of a cold or flu. Do not treat yourself. Try to avoid beingaround people who are sick. Avoid taking medicines that contain aspirin, acetaminophen, ibuprofen, naproxen, or ketoprofen unless instructed by your healthcare professional.These medicines may hide a fever. This medicine may increase your risk to bruise or bleed. Call your doctor orhealth care professional if you notice any unusual bleeding. Be careful brushing and flossing your teeth or using a toothpick because you may get an infection or bleed more easily. If you have any dental work done,tell your dentist you are receiving this medicine. Do not become pregnant while taking this medicine or for 14 months after stopping it. Women should inform their healthcare professional if they wish to become pregnant or think they might be pregnant. Men should not father a child while taking this medicine and for 11 months after stopping it. There is potential for serious side effects to an unborn child. Talk to your healthcareprofessional for more information. Do not breast-feed an infant while taking this medicine. This  medicine has caused ovarian failure in some women. This medicine may make it more difficult to get pregnant. Talk to  your healthcare professional if [place] S[place] concerned about your fertility. This medicine has caused decreased sperm counts in some men. This may make it more difficult to father a child. Talk to your healthcare professional if [place] concerned about your fertility. Drink fluids as directed while you are taking this medicine. This will helpprotect your kidneys. Call your doctor or health care professional if you get diarrhea. Do not treatyourself. What side effects may I notice from receiving this medication? Side effects that you should report to your doctor or health care professionalas soon as possible: allergic reactions like skin rash, itching or hives, swelling of the face, lips, or tongue blurred vision changes in vision decreased hearing or ringing of the ears nausea, vomiting pain, redness, or irritation at site where injected pain, tingling, numbness in the hands or feet signs and symptoms of bleeding such as bloody or black, tarry stools; red or dark brown urine; spitting up blood or brown material that looks like coffee grounds; red spots on the skin; unusual bruising or bleeding from the eyes, gums, or nose signs and symptoms of infection like fever; chills; cough; sore throat; pain or trouble passing urine signs and symptoms of kidney injury like trouble passing urine or change in the amount of urine signs and symptoms of low red blood cells or anemia such as unusually weak or tired; feeling faint or lightheaded; falls; breathing problems Side effects that usually do not require medical attention (report to yourdoctor or health care professional if they continue or are bothersome): loss of appetite mouth sores muscle cramps This list may not describe all possible side effects. Call your doctor for medical advice about side effects. You may report side effects to FDA at1-800-FDA-1088. Where should I keep my medication? This drug is given in a hospital or clinic and will not be stored  at home. NOTE: This sheet is a summary. It may not cover all possible information. If you have questions about this medicine, talk to your doctor, pharmacist, orhealth care provider.  2022 Elsevier/Gold Standard (2018-09-05 15:59:17) Gemcitabine injection What is this medication? GEMCITABINE (jem SYE ta been) is a chemotherapy drug. This medicine is used to treat many types of cancer like breast cancer, lung cancer, pancreatic cancer,and ovarian cancer. This medicine may be used for other purposes; ask your health care provider orpharmacist if you have questions. COMMON BRAND NAME(S): Gemzar, Infugem What should I tell my care team before I take this medication? They need to know if you have any of these conditions: blood disorders infection kidney disease liver disease lung or breathing disease, like asthma recent or ongoing radiation therapy an unusual or allergic reaction to gemcitabine, other chemotherapy, other medicines, foods, dyes, or preservatives pregnant or trying to get pregnant breast-feeding How should I use this medication? This drug is given as an infusion into a vein. It is administered in a hospitalor clinic by a specially trained health care professional. Talk to your pediatrician regarding the use of this medicine in children.Special care may be needed. Overdosage: If you think you have taken too much of this medicine contact apoison control center or emergency room at once. NOTE: This medicine is only for you. Do not share this medicine with others. What if I miss a dose? It is important not to miss your dose. Call your doctor or health careprofessional if you are unable to keep an  appointment. What may interact with this medication? medicines to increase blood counts like filgrastim, pegfilgrastim, sargramostim some other chemotherapy drugs like cisplatin vaccines Talk to your doctor or health care professional before taking any of  thesemedicines: acetaminophen aspirin ibuprofen ketoprofen naproxen This list may not describe all possible interactions. Give your health care provider a list of all the medicines, herbs, non-prescription drugs, or dietary supplements you use. Also tell them if you smoke, drink alcohol, or use illegaldrugs. Some items may interact with your medicine. What should I watch for while using this medication? Visit your doctor for checks on your progress. This drug may make you feel generally unwell. This is not uncommon, as chemotherapy can affect healthy cells as well as cancer cells. Report any side effects. Continue your course oftreatment even though you feel ill unless your doctor tells you to stop. In some cases, you may be given additional medicines to help with side effects.Follow all directions for their use. Call your doctor or health care professional for advice if you get a fever, chills or sore throat, or other symptoms of a cold or flu. Do not treat yourself. This drug decreases your body's ability to fight infections. Try toavoid being around people who are sick. This medicine may increase your risk to bruise or bleed. Call your doctor orhealth care professional if you notice any unusual bleeding. Be careful brushing and flossing your teeth or using a toothpick because you may get an infection or bleed more easily. If you have any dental work done,tell your dentist you are receiving this medicine. Avoid taking products that contain aspirin, acetaminophen, ibuprofen, naproxen, or ketoprofen unless instructed by your doctor. These medicines may hide afever. Do not become pregnant while taking this medicine or for 6 months after stopping it. Women should inform their doctor if they wish to become pregnant or think they might be pregnant. Men should not father a child while taking this medicine and for 3 months after stopping it. There is a potential for serious side effects to an unborn child.  Talk to your health care professional or pharmacist for more information. Do not breast-feed an infant while takingthis medicine or for at least 1 week after stopping it. Men should inform their doctors if they wish to father a child. This medicine may lower sperm counts. Talk with your doctor or health care professional ifyou are concerned about your fertility. What side effects may I notice from receiving this medication? Side effects that you should report to your doctor or health care professionalas soon as possible: allergic reactions like skin rash, itching or hives, swelling of the face, lips, or tongue breathing problems pain, redness, or irritation at site where injected signs and symptoms of a dangerous change in heartbeat or heart rhythm like chest pain; dizziness; fast or irregular heartbeat; palpitations; feeling faint or lightheaded, falls; breathing problems signs of decreased platelets or bleeding - bruising, pinpoint red spots on the skin, black, tarry stools, blood in the urine signs of decreased red blood cells - unusually weak or tired, feeling faint or lightheaded, falls signs of infection - fever or chills, cough, sore throat, pain or difficulty passing urine signs and symptoms of kidney injury like trouble passing urine or change in the amount of urine signs and symptoms of liver injury like dark yellow or brown urine; general ill feeling or flu-like symptoms; light-colored stools; loss of appetite; nausea; right upper belly pain; unusually weak or tired; yellowing of the eyes or skin swelling  of ankles, feet, hands Side effects that usually do not require medical attention (report to yourdoctor or health care professional if they continue or are bothersome): constipation diarrhea hair loss loss of appetite nausea rash vomiting This list may not describe all possible side effects. Call your doctor for medical advice about side effects. You may report side effects to FDA  at1-800-FDA-1088. Where should I keep my medication? This drug is given in a hospital or clinic and will not be stored at home. NOTE: This sheet is a summary. It may not cover all possible information. If you have questions about this medicine, talk to your doctor, pharmacist, orhealth care provider.  2022 Elsevier/Gold Standard (2017-12-04 18:06:11) [facility]  Discharge Instructions: Thank you for choosing [facility] to provide your oncology and hematology care.  If you have a lab appointment with the [facility], please go directly to the [facility] and check in at the registration area.   Wear comfortable clothing and clothing appropriate for easy access to any Portacath or PICC line.   We strive to give you quality time with your provider. You may need to reschedule your appointment if you arrive late (15 or more minutes).  Arriving late affects you and other patients whose appointments are after yours.  Also, if you miss three or more appointments without notifying the office, you may be dismissed from the clinic at the provider's discretion.      For prescription refill requests, have your pharmacy contact our office and allow 72 hours for refills to be completed.    Today you received the following chemotherapy and/or immunotherapy agents; Gemcitabine/ Cisplatin   To help prevent nausea and vomiting after your treatment, we encourage you to take your nausea medication as directed.  BELOW ARE SYMPTOMS THAT SHOULD BE REPORTED IMMEDIATELY: *FEVER GREATER THAN 100.4 F (38 C) OR HIGHER *CHILLS OR SWEATING *NAUSEA AND VOMITING THAT IS NOT CONTROLLED WITH YOUR NAUSEA MEDICATION *UNUSUAL SHORTNESS OF BREATH *UNUSUAL BRUISING OR BLEEDING *URINARY PROBLEMS (pain or burning when urinating, or frequent urination) *BOWEL PROBLEMS (unusual diarrhea, constipation, pain near the anus) TENDERNESS IN MOUTH AND THROAT WITH OR WITHOUT PRESENCE OF ULCERS  (sore throat, sores in mouth, or a toothache) UNUSUAL RASH, SWELLING OR PAIN  UNUSUAL VAGINAL DISCHARGE OR ITCHING   Items with * indicate a potential emergency and should be followed up as soon as possible or go to the Emergency Department if any problems should occur.  Please show the CHEMOTHERAPY ALERT CARD or IMMUNOTHERAPY ALERT CARD at check-in to the Emergency Department and triage nurse.  Should you have questions after your visit or need to cancel or reschedule your appointment, please contact [facility]  Dept: [phone]  and follow the prompts.  Office hours are 8:00 a.m. to 4:30 p.m. Monday - Friday. Please note that voicemails left after 4:00 p.m. may not be returned until the following business day.  We are closed weekends and major holidays. You have access to a nurse at all times for urgent questions. Please call the main number to the clinic Dept: [phone] and follow the prompts.  For any non-urgent questions, you may also contact your provider using MyChart. We now offer e-Visits for anyone 28 and older to request care online for non-urgent symptoms. For details visit [email or web address].   Also download the MyChart app! Go to the app store, search "MyChart", open the app, select [facility], and log in with your  MyChart username and password.  Due to Covid, a mask is required upon entering the hospital/clinic. If you do not have a mask, one will be given to you upon arrival. For doctor visits, patients may have 1 support person aged 35 or older with them. For treatment visits, patients cannot have anyone with them due to current Covid guidelines and our immunocompromised population.

## 2021-04-18 NOTE — Progress Notes (Signed)
IV lasix given due to increased volume in and decreased output during Cisplatin. Unable to verify weight in computer, large decrease in 30 days, ? Accuracy.    Wilber Bihari, NP

## 2021-04-20 ENCOUNTER — Telehealth: Payer: Self-pay | Admitting: Hematology and Oncology

## 2021-04-20 NOTE — Telephone Encounter (Signed)
Per 7/11 LOS, patient scheduled for Aug Appt's.  Called patient w/Appt's and he requested to send Appt's to Email - Done

## 2021-04-21 ENCOUNTER — Encounter: Payer: Self-pay | Admitting: Oncology

## 2021-04-24 ENCOUNTER — Telehealth: Payer: Self-pay | Admitting: Hematology and Oncology

## 2021-04-24 ENCOUNTER — Ambulatory Visit: Payer: Medicare Other | Admitting: Hematology and Oncology

## 2021-04-24 NOTE — Telephone Encounter (Signed)
Patient scheduled for 8/4 Labs, MRI, 8/5 Follow Up w/Melissa, 8/9 Infusion.  Gave patient Appt Summary/Orders

## 2021-04-26 ENCOUNTER — Other Ambulatory Visit: Payer: Medicare Other

## 2021-04-27 ENCOUNTER — Encounter: Payer: Self-pay | Admitting: Hematology and Oncology

## 2021-04-27 ENCOUNTER — Inpatient Hospital Stay: Payer: Medicare Other | Attending: Oncology

## 2021-04-27 DIAGNOSIS — C240 Malignant neoplasm of extrahepatic bile duct: Secondary | ICD-10-CM

## 2021-04-27 DIAGNOSIS — Z5111 Encounter for antineoplastic chemotherapy: Secondary | ICD-10-CM | POA: Insufficient documentation

## 2021-04-27 DIAGNOSIS — C786 Secondary malignant neoplasm of retroperitoneum and peritoneum: Secondary | ICD-10-CM | POA: Insufficient documentation

## 2021-04-27 DIAGNOSIS — C221 Intrahepatic bile duct carcinoma: Secondary | ICD-10-CM | POA: Insufficient documentation

## 2021-04-27 LAB — CBC: RBC: 2.79 — AB (ref 3.87–5.11)

## 2021-04-27 LAB — HEPATIC FUNCTION PANEL
ALT: 15 (ref 10–40)
AST: 24 (ref 14–40)
Alkaline Phosphatase: 56 (ref 25–125)
Bilirubin, Total: 0.5

## 2021-04-27 LAB — BASIC METABOLIC PANEL
BUN: 21 (ref 4–21)
CO2: 26 — AB (ref 13–22)
Chloride: 104 (ref 99–108)
Creatinine: 1.4 — AB (ref 0.6–1.3)
Glucose: 112
Potassium: 4.5 (ref 3.4–5.3)
Sodium: 139 (ref 137–147)

## 2021-04-27 LAB — COMPREHENSIVE METABOLIC PANEL
Albumin: 4.1 (ref 3.5–5.0)
Calcium: 8.9 (ref 8.7–10.7)

## 2021-04-27 LAB — CBC AND DIFFERENTIAL
HCT: 27 — AB (ref 41–53)
Hemoglobin: 8.8 — AB (ref 13.5–17.5)
Neutrophils Absolute: 1.6
Platelets: 73 — AB (ref 150–399)
WBC: 3.2

## 2021-04-28 ENCOUNTER — Encounter: Payer: Self-pay | Admitting: Hematology and Oncology

## 2021-04-28 ENCOUNTER — Inpatient Hospital Stay (INDEPENDENT_AMBULATORY_CARE_PROVIDER_SITE_OTHER): Payer: Medicare Other | Admitting: Hematology and Oncology

## 2021-04-28 ENCOUNTER — Encounter: Payer: Self-pay | Admitting: Oncology

## 2021-04-28 ENCOUNTER — Other Ambulatory Visit: Payer: Self-pay | Admitting: Pharmacist

## 2021-04-28 VITALS — BP 137/62 | HR 84 | Temp 98.6°F | Resp 18 | Ht 74.0 in | Wt 271.0 lb

## 2021-04-28 DIAGNOSIS — C221 Intrahepatic bile duct carcinoma: Secondary | ICD-10-CM | POA: Diagnosis not present

## 2021-04-28 NOTE — Progress Notes (Signed)
[facility]  [address] [place],  [place]  [address] [phone]  Clinic Day: 04/03/2021  Referring physician: Galvin Proffer, MD   HISTORY OF PRESENT ILLNESS:  The patient is a 80 y.o. 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  He is due cycle 9 cisplatin/gemcitabine next week. He had MRI imaging of the abdomen yesterday. He states he is feeling better, having not felt well for the last several days with weakness and dizziness. He suffered a fall 3 weeks ago due to dizziness and bruised his jaw. Today he denies fever, chills, nausea or vomiting. He denies shortness of breath, chest pain or cough. He denies issue with bowel or bladder. CBC reveals hemoglobin 8.8. CMP is unremarkable. MRI abdomen is stable since last study.  PHYSICAL EXAM:  Blood pressure 137/62, pulse 84, temperature 98.6 F (37 C), temperature source Oral, resp. rate 18, height 6\' 2"  (1.88 m), weight 271 lb (122.9 kg), SpO2 94 %. Wt Readings from Last 3 Encounters:  04/28/21 271 lb (122.9 kg)  04/18/21 271 lb (122.9 kg)  04/04/21 274 lb 1.3 oz (124.3 kg)   Body mass index is 34.79 kg/m. Performance status (ECOG): 1 - Symptomatic but completely ambulatory Physical Exam Constitutional:      Appearance: Normal appearance. He is not ill-appearing.  HENT:     Mouth/Throat:     Mouth: Mucous membranes are moist.     Pharynx: Oropharynx is clear. No oropharyngeal exudate or posterior oropharyngeal erythema.  Cardiovascular:     Rate and Rhythm: Normal rate and regular rhythm.     Heart sounds: No murmur heard.   No friction rub. No gallop.  Pulmonary:     Effort: Pulmonary effort is normal. No respiratory distress.     Breath sounds: Normal breath sounds. No wheezing, rhonchi or rales.  Chest:  Breasts:    Right: No axillary  adenopathy or supraclavicular adenopathy.     Left: No axillary adenopathy or supraclavicular adenopathy.  Abdominal:     General: Bowel sounds are normal. There is no distension.     Palpations: Abdomen is soft. There is no mass (no palpable abdominal passes are appreciated).     Tenderness: There is no abdominal tenderness.  Musculoskeletal:        General: No swelling.     Right lower leg: No edema.     Left lower leg: No edema.  Lymphadenopathy:     Cervical: No cervical adenopathy.     Upper Body:     Right upper body: No supraclavicular or axillary adenopathy.     Left upper body: No supraclavicular or axillary adenopathy.     Lower Body: No right inguinal adenopathy. No left inguinal adenopathy.  Skin:    General: Skin is warm.     Coloration: Skin is not jaundiced.     Findings: No lesion or rash.  Neurological:     General: No focal deficit present.     Mental Status: He is alert and oriented to person, place, and time. Mental status is at baseline.     Cranial Nerves: Cranial nerves are intact.  Psychiatric:        Mood and Affect: Mood normal.        Thought Content: Thought content normal.    LABS:  Results for MAISON, KESTENBAUM (MRN [place]) as of 04/28/2021 10:28  Ref. Range 04/27/2021 00:00  Sodium Latest Ref Range: 137 - 147  139  Potassium Latest Ref Range: 3.4 - 5.3  4.5  Chloride Latest Ref Range: 99 - 108  104  CO2 Latest Ref Range: 13 - 22  26 (A)  Glucose Unknown 112  BUN Latest Ref Range: 4 - 21  21  Creatinine Latest Ref Range: 0.6 - 1.3  1.4 (A)  Calcium Latest Ref Range: 8.7 - 10.7  8.9  Alkaline Phosphatase Latest Ref Range: 25 - 125  56  Albumin Latest Ref Range: 3.5 - 5.0  4.1  AST Latest Ref Range: 14 - 40  24  ALT Latest Ref Range: 10 - 40  15  Bilirubin, Total Unknown 0.5  WBC Unknown 3.2  RBC Latest Ref Range: 3.87 - 5.11  2.79 (A)  Hemoglobin Latest Ref Range: 13.5 - 17.5  8.8 (A)  HCT Latest Ref Range: 41 - 53  27 (A)  Platelets  Latest Ref Range: 150 - 399  73 (A)  NEUT# Unknown 1.60   Exam(s): [id number] MRI/MRI ABDOMEN WWO CONTRAST CLINICAL DATA:  Cholangio carcinoma status post partial left hepatectomy. Hepatic metastatic lesions surveillance.  EXAM: MRI ABDOMEN WITHOUT AND WITH CONTRAST  TECHNIQUE: Multiplanar multisequence MR imaging of the abdomen was performed both before and after the administration of intravenous contrast.  CONTRAST:  10 cc Gadavist  COMPARISON:  Multiple exams, including 01/19/2021  FINDINGS: Lower chest: Indistinct interstitial and bandlike opacity in the right lower lobe with possible airway plugging, similar to the prior exam. Mild cardiomegaly.  Hepatobiliary: Partial left hepatectomy.  Approximately 0.8 cm arterial phase enhancing lesion along the dome of segment 7 of the liver on image 23 series 801, stable. This lesion has restriction of diffusion and is likely a metastatic lesion.  Similar 0.9 cm lesion posteriorly in the right hepatic lobe on image 39 series 10,801, unchanged.  0.6 cm focus of restricted diffusion anteriorly on image 10 of series 6 is probably in segment 4, less likely segment 8, minimally less conspicuous compared to prior although this may be from slice selection.  Clustered accentuated restriction of diffusion and enhancement in segment 4b of the liver demonstrates some internal cystic necrosis or degeneration anteriorly but is otherwise similar to the prior exam and measuring about 5.3 by 5.3 cm on image 47 series 801.  No new or substantially enlarging liver lesions are identified.  Pancreas:  Unremarkable  Spleen:  Unremarkable  Adrenals/Urinary Tract: Small bilateral renal cysts. Adrenal glands unremarkable.  Stomach/Bowel: Unremarkable  Vascular/Lymphatic:  Unremarkable  Other: Mild enhancing nodularity along the omentum measuring 1.5 by 1.0 cm on 51 series 802, stable.  Musculoskeletal: Lower lumbar degenerative disc  disease.  IMPRESSION: 1. Stable appearance of 4 sites of high suspicion for metastatic disease in the liver, with the most confluent disease in segment 4. 2. Stable enhancing nodule along the omentum likewise favoring a metastatic lesion. 3. Stable interstitial and bandlike opacity in right lower lobe with possible airway plugging. 4. Mild cardiomegaly.   Electronically Signed   By: Van Clines M.D.   On: 04/28/2021 10:39  Electronically Signed By: Carron Curie MD  Electronically Signed Date/Time: 08/05/221042 Dictate Date/Time: 04/28/21 1026 ASSESSMENT & PLAN:  Assessment/Plan:  A 80 y.o. male with metastatic FGFR2 mutation positive cholangiocarcinoma.  He will proceed with his 9th cycle of treatment next week. As his MRI showed stable disease, we will discontinue the cisplatin portion and continue with maintenance gemcitabine.  His treatments will remain spaced  out to where he will be on a day 1,15 regimen, with each cycle being spaced out to every 4 weeks.  This is being done to give his bone marrow more time to reproduce blood cells before he scheduled for a subsequent treatment. He will return to clinic in 4 weeks for repeat evaluation with Dr. Bobby Rumpf.   He verbalizes understanding of and agreement to the plans discussed today. He knows to call the office should any new questions or concerns arise.   Dayton Scrape, FNP- [facility]

## 2021-04-28 NOTE — Progress Notes (Signed)
Discontinue further cisplatin since scans are stable per Melissa.

## 2021-05-01 ENCOUNTER — Other Ambulatory Visit: Payer: Self-pay | Admitting: Oncology

## 2021-05-01 ENCOUNTER — Encounter: Payer: Self-pay | Admitting: Oncology

## 2021-05-02 ENCOUNTER — Inpatient Hospital Stay: Payer: Medicare Other

## 2021-05-02 ENCOUNTER — Other Ambulatory Visit: Payer: Self-pay | Admitting: Hematology and Oncology

## 2021-05-02 ENCOUNTER — Other Ambulatory Visit: Payer: Self-pay

## 2021-05-02 ENCOUNTER — Encounter: Payer: Self-pay | Admitting: Oncology

## 2021-05-02 VITALS — BP 104/59 | HR 74 | Temp 98.1°F | Resp 16 | Ht 74.0 in | Wt 269.1 lb

## 2021-05-02 DIAGNOSIS — C240 Malignant neoplasm of extrahepatic bile duct: Secondary | ICD-10-CM

## 2021-05-02 DIAGNOSIS — C786 Secondary malignant neoplasm of retroperitoneum and peritoneum: Secondary | ICD-10-CM | POA: Diagnosis not present

## 2021-05-02 DIAGNOSIS — C221 Intrahepatic bile duct carcinoma: Secondary | ICD-10-CM | POA: Diagnosis not present

## 2021-05-02 DIAGNOSIS — Z5111 Encounter for antineoplastic chemotherapy: Secondary | ICD-10-CM | POA: Diagnosis not present

## 2021-05-02 LAB — BASIC METABOLIC PANEL
BUN: 19 (ref 4–21)
CO2: 30 — AB (ref 13–22)
Chloride: 102 (ref 99–108)
Creatinine: 1.2 (ref 0.6–1.3)
Glucose: 115
Potassium: 4.4 (ref 3.4–5.3)
Sodium: 139 (ref 137–147)

## 2021-05-02 LAB — CBC AND DIFFERENTIAL
HCT: 27 — AB (ref 41–53)
Hemoglobin: 8.9 — AB (ref 13.5–17.5)
Neutrophils Absolute: 3.5
Platelets: 117 — AB (ref 150–399)
WBC: 4.8

## 2021-05-02 LAB — COMPREHENSIVE METABOLIC PANEL
Albumin: 3.9 (ref 3.5–5.0)
Calcium: 8.7 (ref 8.7–10.7)

## 2021-05-02 LAB — HEPATIC FUNCTION PANEL
ALT: 13 (ref 10–40)
AST: 24 (ref 14–40)
Alkaline Phosphatase: 54 (ref 25–125)
Bilirubin, Total: 0.4

## 2021-05-02 LAB — CBC: RBC: 2.8 — AB (ref 3.87–5.11)

## 2021-05-02 MED ORDER — SODIUM CHLORIDE 0.9 % IV SOLN
Freq: Once | INTRAVENOUS | Status: AC
  Filled 2021-05-02: qty 250

## 2021-05-02 MED ORDER — SODIUM CHLORIDE 0.9% FLUSH
10.0000 mL | INTRAVENOUS | Status: DC | PRN
  Administered 2021-05-02: 10 mL
  Filled 2021-05-02: qty 10

## 2021-05-02 MED ORDER — SODIUM CHLORIDE 0.9 % IV SOLN
10.0000 mg | Freq: Once | INTRAVENOUS | Status: AC
  Administered 2021-05-02: 10 mg via INTRAVENOUS
  Filled 2021-05-02: qty 10

## 2021-05-02 MED ORDER — SODIUM CHLORIDE 0.9 % IV SOLN
750.0000 mg/m2 | Freq: Once | INTRAVENOUS | Status: AC
  Administered 2021-05-02: 2014 mg via INTRAVENOUS
  Filled 2021-05-02: qty 52.97

## 2021-05-02 NOTE — Progress Notes (Signed)
0907- NS, 500 ml bolus started for low SBP Patient tolerated bolus well. 1208- Discharged home, stable.

## 2021-05-02 NOTE — Patient Instructions (Signed)
Gary [name]  Discharge Instructions: Thank you for choosing [facility] to provide your oncology and hematology care.  If you have a lab appointment with the [facility], please go directly to the [facility] and check in at the registration area.   Wear comfortable clothing and clothing appropriate for easy access to any Portacath or PICC line.   We strive to give you quality time with your provider. You may need to reschedule your appointment if you arrive late (15 or more minutes).  Arriving late affects you and other patients whose appointments are after yours.  Also, if you miss three or more appointments without notifying the office, you may be dismissed from the clinic at the provider's discretion.      For prescription refill requests, have your pharmacy contact our office and allow 72 hours for refills to be completed.    Today you received the following chemotherapy and/or immunotherapy agents Gemzar   To help prevent nausea and vomiting after your treatment, we encourage you to take your nausea medication as directed.  BELOW ARE SYMPTOMS THAT SHOULD BE REPORTED IMMEDIATELY: *FEVER GREATER THAN 100.4 F (38 C) OR HIGHER *CHILLS OR SWEATING *NAUSEA AND VOMITING THAT IS NOT CONTROLLED WITH YOUR NAUSEA MEDICATION *UNUSUAL SHORTNESS OF BREATH *UNUSUAL BRUISING OR BLEEDING *URINARY PROBLEMS (pain or burning when urinating, or frequent urination) *BOWEL PROBLEMS (unusual diarrhea, constipation, pain near the anus) TENDERNESS IN MOUTH AND THROAT WITH OR WITHOUT PRESENCE OF ULCERS (sore throat, sores in mouth, or a toothache) UNUSUAL RASH, SWELLING OR PAIN  UNUSUAL VAGINAL DISCHARGE OR ITCHING   Items with * indicate a potential emergency and should be followed up as soon as possible or go to the Emergency Department if any problems should occur.  Please show the CHEMOTHERAPY ALERT CARD or IMMUNOTHERAPY ALERT CARD at check-in to the Emergency  Department and triage nurse.  Should you have questions after your visit or need to cancel or reschedule your appointment, please contact [facility]  Dept: [phone]  and follow the prompts.  Office hours are 8:00 a.m. to 4:30 p.m. Monday - Friday. Please note that voicemails left after 4:00 p.m. may not be returned until the following business day.  We are closed weekends and major holidays. You have access to a nurse at all times for urgent questions. Please call the main number to the clinic Dept: [phone] and follow the prompts.  For any non-urgent questions, you may also contact your provider using MyChart. We now offer e-Visits for anyone 63 and older to request care online for non-urgent symptoms. For details visit [email or web address].   Also download the MyChart app! Go to the app store, search "MyChart", open the app, select [facility], and log in with your MyChart username and password.  Due to Covid, a mask is required upon entering the hospital/clinic. If you do not have a mask, one will be given to you upon arrival. For doctor visits, patients may have 1 support person aged 2 or older with them. For treatment visits, patients cannot have anyone with them due to current Covid guidelines and our immunocompromised population.

## 2021-05-08 ENCOUNTER — Encounter: Payer: Self-pay | Admitting: Oncology

## 2021-05-10 ENCOUNTER — Other Ambulatory Visit: Payer: Self-pay

## 2021-05-10 DIAGNOSIS — F330 Major depressive disorder, recurrent, mild: Secondary | ICD-10-CM | POA: Insufficient documentation

## 2021-05-10 DIAGNOSIS — R1310 Dysphagia, unspecified: Secondary | ICD-10-CM | POA: Insufficient documentation

## 2021-05-10 DIAGNOSIS — I6523 Occlusion and stenosis of bilateral carotid arteries: Secondary | ICD-10-CM | POA: Insufficient documentation

## 2021-05-10 DIAGNOSIS — I77811 Abdominal aortic ectasia: Secondary | ICD-10-CM | POA: Insufficient documentation

## 2021-05-11 ENCOUNTER — Ambulatory Visit (INDEPENDENT_AMBULATORY_CARE_PROVIDER_SITE_OTHER): Payer: Medicare Other

## 2021-05-11 ENCOUNTER — Encounter: Payer: Self-pay | Admitting: Cardiology

## 2021-05-11 ENCOUNTER — Other Ambulatory Visit: Payer: Self-pay

## 2021-05-11 ENCOUNTER — Ambulatory Visit (INDEPENDENT_AMBULATORY_CARE_PROVIDER_SITE_OTHER): Payer: Medicare Other | Admitting: Cardiology

## 2021-05-11 VITALS — BP 128/80 | HR 82 | Ht 74.0 in | Wt 263.6 lb

## 2021-05-11 DIAGNOSIS — C240 Malignant neoplasm of extrahepatic bile duct: Secondary | ICD-10-CM | POA: Diagnosis not present

## 2021-05-11 DIAGNOSIS — R42 Dizziness and giddiness: Secondary | ICD-10-CM

## 2021-05-11 DIAGNOSIS — R0600 Dyspnea, unspecified: Secondary | ICD-10-CM | POA: Diagnosis not present

## 2021-05-11 DIAGNOSIS — I255 Ischemic cardiomyopathy: Secondary | ICD-10-CM

## 2021-05-11 DIAGNOSIS — R0609 Other forms of dyspnea: Secondary | ICD-10-CM

## 2021-05-11 DIAGNOSIS — I2510 Atherosclerotic heart disease of native coronary artery without angina pectoris: Secondary | ICD-10-CM | POA: Diagnosis not present

## 2021-05-11 NOTE — Patient Instructions (Signed)
Medication Instructions:  Your physician recommends that you continue on your current medications as directed. Please refer to the Current Medication list given to you today.  *If you need a refill on your cardiac medications before your next appointment, please call your pharmacy*   Lab Work: None If you have labs (blood work) drawn today and your tests are completely normal, you will receive your results only by: . MyChart Message (if you have MyChart) OR . A paper copy in the mail If you have any lab test that is abnormal or we need to change your treatment, we will call you to review the results.   Testing/Procedures: A zio monitor was ordered today. It will remain on for 7 days. You will then return monitor and event diary in provided box. It takes 1-2 weeks for report to be downloaded and returned to us. We will call you with the results. If monitor falls off or has orange flashing light, please call Zio for further instructions.      Follow-Up: At [facility], you and your health needs are our priority.  As part of our continuing mission to provide you with exceptional heart care, we have created designated Provider Care Teams.  These Care Teams include your primary Cardiologist (physician) and Advanced Practice Providers (APPs -  Physician Assistants and Nurse Practitioners) who all work together to provide you with the care you need, when you need it.  We recommend signing up for the patient portal called "MyChart".  Sign up information is provided on this After Visit Summary.  MyChart is used to connect with patients for Virtual Visits (Telemedicine).  Patients are able to view lab/test results, encounter notes, upcoming appointments, etc.  Non-urgent messages can be sent to your provider as well.   To learn more about what you can do with MyChart, go to [email or web address].    Your next appointment:   6 month(s)  The format for your next appointment:   In  Person  Provider:   Robert Krasowski, MD   Other Instructions    

## 2021-05-11 NOTE — Addendum Note (Signed)
Addended by: Resa Miner I on: 05/11/2021 03:51 PM   Modules accepted: Orders

## 2021-05-11 NOTE — Progress Notes (Signed)
Cardiology Office Note:    Date:  05/11/2021   ID:  Gary [name], DOB 08-01-1941, MRN [id number]  PCP:  Gary Nasuti, MD  Cardiologist:  Gary Campus, MD    Referring MD: Gary Nasuti, MD   Chief Complaint  Patient presents with   Shortness of Breath   Loss of Consciousness   Tachycardia    History of Present Illness:    Gary [name] is a 80 y.o. male  . male with past medical history significant for coronary artery disease, in July 11, 2020 he did have cardiac catheterization in face of acute non-STEMI.  He was find to have multiple lesions including the culprit lesion which was the mid and distal portion of the circumflex artery.  That was addressed with drug-eluting stent.  He was put on dual antiplatelets therapy and discharged home.  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  Etiology of this cardiomyopathy is at least partially ischemic. He comes today to my office for follow-up.  He described 2 concerns first of all he about few episode of syncope syncope typically happen once he gets up and try to go to the restroom he sits down and then he gets up and he got dizzy.  It never happened when he sitting it never happened when he is laying down.  Another concern is exertional shortness of breath.  Walking to the mailbox if any significant shortness of breath.  There is no chest pain tightness squeezing pressure burning chest.  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   Chronic obstructive pulmonary disease ([place]) 12/13/2020   Chronic pain syndrome [id number]   Chronic systolic heart  failure ([place]) 12/13/2020   Coronary artery disease with PTCA and stenting of mid and distal circumflex artery on 07/11/2020 [id number]   Diastolic congestive heart failure ([place]) 04/24/2019   Dizziness 08/30/2020   Dyspnea on exertion 04/24/2019   Encounter for therapeutic drug level monitoring 06/15/2020   Essential hypertension 04/24/2019   Gastro-esophageal reflux disease without esophagitis 12/13/2020   Generalized anxiety disorder 12/13/2020   Hyperglycemia due to type 2 diabetes mellitus ([place]) 12/13/2020   Hypertension    Hypothyroidism 12/13/2020   Liver tumor 12/27/2017   Formatting of this note might be different from the original. 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   Mixed hyperlipidemia 12/13/2020   NSTEMI (non-ST elevated myocardial infarction) ([place]) 07/09/2020   Orthostatic hypotension 10/20/2020   Other long term (current) drug therapy 12/13/2020   Other vitamin B12 deficiency anemias 12/13/2020   Pain in left hip 12/13/2020   Preop cardiovascular exam 06/15/2020   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/30/2020   Type 2 diabetes mellitus without complications ([place]) [id number]   Unsteadiness on feet 08/30/2020   UTI (urinary tract infection) 01/08/2018   Vitamin D deficiency 12/13/2020    Past Surgical History:  Procedure Laterality Date   BILE DUCT EXPLORATION  To remove cancer   CORONARY STENT INTERVENTION N/A 07/11/2020   Procedure: CORONARY STENT INTERVENTION;  Surgeon: Gary Man, MD;  Location: [facility];  Service: Cardiovascular;  Laterality: N/A;   LEFT HEART CATH AND CORONARY ANGIOGRAPHY N/A 07/11/2020   Procedure: LEFT HEART CATH AND CORONARY ANGIOGRAPHY;  Surgeon: Gary Man, MD;  Location: [facility];  Service:  Cardiovascular;  Laterality: N/A;   MOHS SURGERY     Basil cell on nose    Current Medications: Current Meds  Medication Sig   ALPRAZolam (XANAX) 0.5 MG tablet Take 0.5 mg by mouth 3 (three) times daily as needed for anxiety.    aspirin EC 81 MG tablet Take 1 tablet (81 mg total) by mouth daily. Swallow whole.   buPROPion (WELLBUTRIN XL) 300 MG 24 hr tablet Take 1 tablet by mouth daily. For 90 days   carvedilol (COREG) 6.25 MG tablet Take 1 tablet by mouth twice daily (Patient taking differently: Take 6.25 mg by mouth 2 (two) times daily with a meal.)   clopidogrel (PLAVIX) 75 MG tablet Take 75 mg by mouth daily.   furosemide (LASIX) 20 MG tablet Take 20 mg by mouth daily.   HYDROcodone-acetaminophen (NORCO) 10-325 MG tablet Take 1 tablet by mouth 3 (three) times daily as needed for moderate pain or severe pain.   isosorbide mononitrate (IMDUR) 30 MG 24 hr tablet Take 1 tablet by mouth once daily (Patient taking differently: Take 30 mg by mouth daily.)   losartan (COZAAR) 25 MG tablet Take 1 tablet by mouth in the morning and at bedtime.   montelukast (SINGULAIR) 10 MG tablet Take 1 tablet by mouth daily.   Multiple Vitamin (MULTIVITAMIN) tablet Take 1 tablet by mouth daily. Unknown Strength per patient   nitroGLYCERIN (NITROSTAT) 0.4 MG SL tablet Place 1 tablet (0.4 mg total) under the tongue every 5 (five) minutes as needed for chest pain.   ofloxacin (OCUFLOX) 0.3 % ophthalmic solution Place 1 drop into both eyes 4 (four) times daily.   Omega-3 Fatty Acids (FISH OIL) 1200 MG CPDR Take 1 tablet by mouth daily.   omeprazole (PRILOSEC) 40 MG capsule Take 1 capsule by mouth daily as needed (Acid reflux).   ondansetron (ZOFRAN) 4 MG tablet TAKE 1 TABLET BY MOUTH EVERY 4 HOURS AS NEEDED FOR NAUSEA OR VOMITING (Patient taking differently: Take 4 mg by mouth every 8 (eight) hours as needed for nausea or vomiting.)   pantoprazole (PROTONIX) 40 MG tablet Take 40 mg by mouth daily.    sacubitril-valsartan (ENTRESTO) 24-26 MG Take 1 tablet by mouth daily. Once a day per patient   tamsulosin (FLOMAX) 0.4 MG CAPS capsule Take 0.4 mg by mouth daily.    vitamin B-12 (CYANOCOBALAMIN) 250 MCG tablet Take 250 mcg by mouth daily.   Vitamin D, Ergocalciferol, 50 MCG (2000 UT) CAPS Take 1 tablet by mouth daily.     Allergies:   Heparin, Pork-derived products, and Amoxicillin   Social History   Socioeconomic History   Marital status: Widowed    Spouse name: Not on file   Number of children: Not on file   Years of education: Not on file   Highest education level: Not on file  Occupational History   Not on file  Tobacco Use   Smoking status: Never   Smokeless tobacco: Current    Types: Chew  Substance and Sexual Activity   Alcohol use: Not Currently   Drug use: Never   Sexual activity: Not  on file  Other Topics Concern   Not on file  Social History Narrative   Not on file   Social Determinants of Health   Financial Resource Strain: Not on file  Food Insecurity: Not on file  Transportation Needs: Not on file  Physical Activity: Not on file  Stress: Not on file  Social Connections: Not on file     Family History: The patient's family history includes Breast cancer in his sister and sister; Cancer in his father. ROS:   Please see the history of present illness.    All 14 point review of systems negative except as described per history of present illness  EKGs/Labs/Other Studies Reviewed:      Recent Labs: 07/08/2020: NT-Pro BNP 164 04/03/2021: Magnesium 1.5 05/02/2021: ALT 13; BUN 19; Creatinine 1.2; Hemoglobin 8.9; Platelets 117; Potassium 4.4; Sodium 139  Recent Lipid Panel    Component Value Date/Time   CHOL 139 07/09/2020 0354   TRIG 255 (H) 07/09/2020 0354   HDL 33 (L) 07/09/2020 0354   CHOLHDL 4.2 07/09/2020 0354   VLDL 51 (H) 07/09/2020 0354   LDLCALC 55 07/09/2020 0354    Physical Exam:    VS:  BP 128/80 (BP Location: Left Arm, Patient  Position: Sitting)   Pulse 82   Ht '6\' 2"'$  (1.88 m)   Wt 263 lb 9.6 oz (119.6 kg)   SpO2 92%   BMI 33.84 kg/m     Wt Readings from Last 3 Encounters:  05/11/21 263 lb 9.6 oz (119.6 kg)  05/02/21 269 lb 1.9 oz (122.1 kg)  04/28/21 271 lb (122.9 kg)     GEN:  Well nourished, well developed in no acute distress HEENT: Normal NECK: No JVD; No carotid bruits LYMPHATICS: No lymphadenopathy CARDIAC: RRR, no murmurs, no rubs, no gallops RESPIRATORY:  Clear to auscultation without rales, wheezing or rhonchi  ABDOMEN: Soft, non-tender, non-distended MUSCULOSKELETAL:  No edema; No deformity  SKIN: Warm and dry LOWER EXTREMITIES: no swelling NEUROLOGIC:  Alert and oriented x 3 PSYCHIATRIC:  Normal affect   ASSESSMENT:    1. Ischemic cardiomyopathy   2. Coronary artery disease involving native coronary artery of native heart without angina pectoris   3. 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 Dyspnea on exertion   5. Dizziness    PLAN:    In order of problems listed above:  Ischemic cardiomyopathy.  Last echocardiogram from May 2021 showed ejection fraction 40 to 45%.  He is taking Cozaar he is also taking carvedilol however the biggest obstacle we have is orthostatic hypotension which is related probably partially to dehydration.  He is on chemotherapy and he said food does not taste good and he does not eat much he also does not drink much.  I will not change any of his medication right now. Coronary disease appears to be stable from that point of denies have any chest pain tightness squeezing pressure burning chest. Liver cancer that being followed up aggressively by oncology team.  I did review oncology notes. Dyspnea on exertion: I suspect is multifactorial he does have significant anemia which is contributing. Dizziness I will ask him to wear Zio patch for a week to see if he got any significant arrhythmia however from description he gave me I doubt very  much.   Medication Adjustments/Labs and Tests Ordered: Current medicines are reviewed at length with the patient today.  Concerns regarding medicines are outlined above.  No orders of the defined types were placed  in this encounter.  Medication changes: No orders of the defined types were placed in this encounter.   Signed, Park Liter, MD, [facility] 05/11/2021 3:45 PM    [place]

## 2021-05-15 ENCOUNTER — Encounter: Payer: Self-pay | Admitting: Oncology

## 2021-05-15 ENCOUNTER — Telehealth: Payer: Self-pay | Admitting: Oncology

## 2021-05-15 ENCOUNTER — Encounter: Payer: Self-pay | Admitting: Hematology and Oncology

## 2021-05-15 ENCOUNTER — Other Ambulatory Visit: Payer: Self-pay | Admitting: Pharmacist

## 2021-05-15 ENCOUNTER — Inpatient Hospital Stay: Payer: Medicare Other

## 2021-05-15 LAB — BASIC METABOLIC PANEL
BUN: 19 (ref 4–21)
CO2: 27 — AB (ref 13–22)
Chloride: 104 (ref 99–108)
Creatinine: 1.4 — AB (ref 0.6–1.3)
Glucose: 173
Potassium: 4.2 (ref 3.4–5.3)
Sodium: 140 (ref 137–147)

## 2021-05-15 LAB — CBC AND DIFFERENTIAL
HCT: 27 — AB (ref 41–53)
Hemoglobin: 8.8 — AB (ref 13.5–17.5)
Neutrophils Absolute: 3.24
Platelets: 112 — AB (ref 150–399)
WBC: 4.7

## 2021-05-15 LAB — CBC: RBC: 2.77 — AB (ref 3.87–5.11)

## 2021-05-15 LAB — HEPATIC FUNCTION PANEL
ALT: 13 (ref 10–40)
AST: 26 (ref 14–40)
Alkaline Phosphatase: 51 (ref 25–125)
Bilirubin, Total: 0.5

## 2021-05-15 LAB — COMPREHENSIVE METABOLIC PANEL
Albumin: 3.8 (ref 3.5–5.0)
Calcium: 8.6 — AB (ref 8.7–10.7)

## 2021-05-15 MED FILL — Dexamethasone Sodium Phosphate Inj 100 MG/10ML: INTRAMUSCULAR | Qty: 1 | Status: AC

## 2021-05-15 NOTE — Telephone Encounter (Signed)
Patient requested printout of next Appt's

## 2021-05-16 ENCOUNTER — Inpatient Hospital Stay: Payer: Medicare Other

## 2021-05-16 ENCOUNTER — Other Ambulatory Visit: Payer: Self-pay

## 2021-05-16 VITALS — BP 92/53 | HR 83 | Temp 98.0°F | Resp 20 | Ht 74.0 in | Wt 265.2 lb

## 2021-05-16 DIAGNOSIS — Z5111 Encounter for antineoplastic chemotherapy: Secondary | ICD-10-CM | POA: Diagnosis not present

## 2021-05-16 DIAGNOSIS — C240 Malignant neoplasm of extrahepatic bile duct: Secondary | ICD-10-CM

## 2021-05-16 MED ORDER — SODIUM CHLORIDE 0.9 % IV SOLN: Freq: Once | INTRAVENOUS | Status: AC

## 2021-05-16 MED ORDER — SODIUM CHLORIDE 0.9% FLUSH
10.0000 mL | INTRAVENOUS | Status: DC | PRN
  Administered 2021-05-16: 10 mL

## 2021-05-16 MED ORDER — SODIUM CHLORIDE 0.9 % IV SOLN
750.0000 mg/m2 | Freq: Once | INTRAVENOUS | Status: AC
  Administered 2021-05-16: 1862 mg via INTRAVENOUS
  Filled 2021-05-16: qty 48.97

## 2021-05-16 MED ORDER — MAGNESIUM SULFATE 4 GM/100ML IV SOLN
4.0000 g | Freq: Once | INTRAVENOUS | Status: AC
  Administered 2021-05-16: 4 g via INTRAVENOUS
  Filled 2021-05-16: qty 100

## 2021-05-16 MED ORDER — SODIUM CHLORIDE 0.9 % IV SOLN
10.0000 mg | Freq: Once | INTRAVENOUS | Status: AC
  Administered 2021-05-16: 10 mg via INTRAVENOUS
  Filled 2021-05-16: qty 10

## 2021-05-16 NOTE — Patient Instructions (Signed)
[facility]  Discharge Instructions: Thank you for choosing [facility] to provide your oncology and hematology care.  If you have a lab appointment with the [facility], please go directly to the [facility] and check in at the registration area.   Wear comfortable clothing and clothing appropriate for easy access to any Portacath or PICC line.   We strive to give you quality time with your provider. You may need to reschedule your appointment if you arrive late (15 or more minutes).  Arriving late affects you and other patients whose appointments are after yours.  Also, if you miss three or more appointments without notifying the office, you may be dismissed from the clinic at the provider's discretion.      For prescription refill requests, have your pharmacy contact our office and allow 72 hours for refills to be completed.    Today you received the following chemotherapy and/or immunotherapy agents GemcitabineOrthostatic Hypotension Blood pressure is a measurement of how strongly, or weakly, your blood is pressing against the walls of your arteries. Orthostatic hypotension is a sudden drop in blood pressure that happens when you quickly change positions,such as when you get up from sitting or lying down. Arteries are blood vessels that carry blood from your heart throughout your body. When blood pressure is too low, you may not get enough blood to your brain or to the rest of your organs. This can cause weakness, light-headedness, rapid heartbeat, and fainting. This can last for just a few seconds or for up to a few minutes. Orthostatic hypotension is usually not a serious problem. However, if it happens frequently or gets worse, it may be a sign of somethingmore serious. What are the causes? This condition may be caused by: Sudden changes in posture, such as standing up quickly after you have been sitting or lying down. Blood loss. Loss of body  fluids (dehydration). Heart problems. Hormone (endocrine) problems. Pregnancy. Severe infection. Lack of certain nutrients. Severe allergic reactions (anaphylaxis). Certain medicines, such as blood pressure medicine or medicines that make the body lose excess fluids (diuretics). Sometimes, this condition can be caused by not taking medicine as directed, such as taking too much of a certain medicine. What increases the risk? The following factors may make you more likely to develop this condition: Age. Risk increases as you get older. Conditions that affect the heart or the central nervous system. Taking certain medicines, such as blood pressure medicine or diuretics. Being pregnant. What are the signs or symptoms? Symptoms of this condition may include: Weakness. Light-headedness. Dizziness. Blurred vision. Fatigue. Rapid heartbeat. Fainting, in severe cases. How is this diagnosed? This condition is diagnosed based on: Your medical history. Your symptoms. Your blood pressure measurement. Your health care provider will check your blood pressure when you are: Lying down. Sitting. Standing. A blood pressure reading is recorded as two numbers, such as "120 over 80" (or 120/80). The first ("top") number is called the systolic pressure. It is a measure of the pressure in your arteries as your heart beats. The second ("bottom") number is called the diastolic pressure. It is a measure of the pressure in your arteries when your heart relaxes between beats. Blood pressure is measured in a unit called mm Hg. Healthy blood pressure for most adults is 120/80. If your blood pressure is below 90/60, you may be diagnosed withhypotension. Other information or tests that may be used to diagnose orthostatic hypotension include: Your other vital  signs, such as your heart rate and temperature. Blood tests. Tilt table test. For this test, you will be safely secured to a table that moves you from a lying  position to an upright position. Your heart rhythm and blood pressure will be monitored during the test. How is this treated? This condition may be treated by: Changing your diet. This may involve eating more salt (sodium) or drinking more water. Taking medicines to raise your blood pressure. Changing the dosage of certain medicines you are taking that might be lowering your blood pressure. Wearing compression stockings. These stockings help to prevent blood clots and reduce swelling in your legs. In some cases, you may need to go to the hospital for: Fluid replacement. This means you will receive fluids through an IV. Blood replacement. This means you will receive donated blood through an IV (transfusion). Treating an infection or heart problems, if this applies. Monitoring. You may need to be monitored while medicines that you are taking wear off. Follow these instructions at home: Eating and drinking  Drink enough fluid to keep your urine pale yellow. Eat a healthy diet, and follow instructions from your health care provider about eating or drinking restrictions. A healthy diet includes: Fresh fruits and vegetables. Whole grains. Lean meats. Low-fat dairy products. Eat extra salt only as directed. Do not add extra salt to your diet unless your health care provider told you to do that. Eat frequent, small meals. Avoid standing up suddenly after eating.  Medicines Take over-the-counter and prescription medicines only as told by your health care provider. Follow instructions from your health care provider about changing the dosage of your current medicines, if this applies. Do not stop or adjust any of your medicines on your own. General instructions  Wear compression stockings as told by your health care provider. Get up slowly from lying down or sitting positions. This gives your blood pressure a chance to adjust. Avoid hot showers and excessive heat as directed by your health care  provider. Return to your normal activities as told by your health care provider. Ask your health care provider what activities are safe for you. Do not use any products that contain nicotine or tobacco, such as cigarettes, e-cigarettes, and chewing tobacco. If you need help quitting, ask your health care provider. Keep all follow-up visits as told by your health care provider. This is important.  Contact a health care provider if you: Vomit. Have diarrhea. Have a fever for more than 2-3 days. Feel more thirsty than usual. Feel weak and tired. Get help right away if you: Have chest pain. Have a fast or irregular heartbeat. Develop numbness in any part of your body. Cannot move your arms or your legs. Have trouble speaking. Become sweaty or feel light-headed. Faint. Feel short of breath. Have trouble staying awake. Feel confused. Summary Orthostatic hypotension is a sudden drop in blood pressure that happens when you quickly change positions. Orthostatic hypotension is usually not a serious problem. It is diagnosed by having your blood pressure taken lying down, sitting, and then standing. It may be treated by changing your diet or adjusting your medicines. This information is not intended to replace advice given to you by your health care provider. Make sure you discuss any questions you have with your healthcare provider. Document Revised: 03/06/2018 Document Reviewed: 03/06/2018 Elsevier Patient Education  2022 [place]. Magnesium Sulfate Injection What is this medication? MAGNESIUM SULFATE (mag NEE zee um SUL fate) prevents and treats low levels of  magnesium in your body. It may also be used to prevent and treat seizures during pregnancy in people with high blood pressure disorders, such as preeclampsia or eclampsia. Magnesium plays an important role in maintaining thehealth of your muscles and nervous system. This medicine may be used for other purposes; ask your health care  provider orpharmacist if you have questions. What should I tell my care team before I take this medication? They need to know if you have any of these conditions: Heart disease History of irregular heart beat Kidney disease An unusual or allergic reaction to magnesium sulfate, medications, foods, dyes, or preservatives Pregnant or trying to get pregnant Breast-feeding How should I use this medication? This medication is for infusion into a vein. It is given in a hospital orclinic setting. Talk to your care team about the use of this medication in children. While thismedication may be prescribed for selected conditions, precautions do apply. Overdosage: If you think you have taken too much of this medicine contact apoison control center or emergency room at once. NOTE: This medicine is only for you. Do not share this medicine with others. What if I miss a dose? This does not apply. What may interact with this medication? Certain medications for anxiety or sleep Certain medications for seizures like phenobarbital Digoxin Medications that relax muscles for surgery Narcotic medications for pain This list may not describe all possible interactions. Give your health care provider a list of all the medicines, herbs, non-prescription drugs, or dietary supplements you use. Also tell them if you smoke, drink alcohol, or use illegaldrugs. Some items may interact with your medicine. What should I watch for while using this medication? Your condition will be monitored carefully while you are receiving this medication. You may need blood work done while you are receiving thismedication. What side effects may I notice from receiving this medication? Side effects that you should report to your care team as soon as possible: Allergic reactions-skin rash, itching, hives, swelling of the face, lips, tongue, or throat High magnesium level-confusion, drowsiness, facial flushing, redness, sweating, muscle  weakness, fast or irregular heartbeat, trouble breathing Low blood pressure-dizziness, feeling faint or lightheaded, blurry vision Side effects that usually do not require medical attention (report to your careteam if they continue or are bothersome): Headache Nausea This list may not describe all possible side effects. Call your doctor for medical advice about side effects. You may report side effects to FDA at1-800-FDA-1088. Where should I keep my medication? This medication is given in a hospital or clinic and will not be stored at home. NOTE: This sheet is a summary. It may not cover all possible information. If you have questions about this medicine, talk to your doctor, pharmacist, orhealth care provider.  2022 Elsevier/Gold Standard (2020-10-31 13:10:26)      To help prevent nausea and vomiting after your treatment, we encourage you to take your nausea medication as directed.  BELOW ARE SYMPTOMS THAT SHOULD BE REPORTED IMMEDIATELY: *FEVER GREATER THAN 100.4 F (38 C) OR HIGHER *CHILLS OR SWEATING *NAUSEA AND VOMITING THAT IS NOT CONTROLLED WITH YOUR NAUSEA MEDICATION *UNUSUAL SHORTNESS OF BREATH *UNUSUAL BRUISING OR BLEEDING *URINARY PROBLEMS (pain or burning when urinating, or frequent urination) *BOWEL PROBLEMS (unusual diarrhea, constipation, pain near the anus) TENDERNESS IN MOUTH AND THROAT WITH OR WITHOUT PRESENCE OF ULCERS (sore throat, sores in mouth, or a toothache) UNUSUAL RASH, SWELLING OR PAIN  UNUSUAL VAGINAL DISCHARGE OR ITCHING   Items with * indicate a potential emergency and should  be followed up as soon as possible or go to the Emergency Department if any problems should occur.  Please show the CHEMOTHERAPY ALERT CARD or IMMUNOTHERAPY ALERT CARD at check-in to the Emergency Department and triage nurse.  Should you have questions after your visit or need to cancel or reschedule your appointment, please contact [facility]  Dept:  [phone]  and follow the prompts.  Office hours are 8:00 a.m. to 4:30 p.m. Monday - Friday. Please note that voicemails left after 4:00 p.m. may not be returned until the following business day.  We are closed weekends and major holidays. You have access to a nurse at all times for urgent questions. Please call the main number to the clinic Dept: [phone] and follow the prompts.  For any non-urgent questions, you may also contact your provider using MyChart. We now offer e-Visits for anyone 13 and older to request care online for non-urgent symptoms. For details visit [email or web address].   Also download the MyChart app! Go to the app store, search "MyChart", open the app, select [facility], and log in with your MyChart username and password.  Due to Covid, a mask is required upon entering the hospital/clinic. If you do not have a mask, one will be given to you upon arrival. For doctor visits, patients may have 1 support person aged 37 or older with them. For treatment visits, patients cannot have anyone with them due to current Covid guidelines and our immunocompromised population.

## 2021-05-16 NOTE — Progress Notes (Signed)
1333:PT STABLE AT TIME OF DISCHARGE ?

## 2021-05-16 NOTE — Progress Notes (Signed)
0942: Reported orthostatic b/p to [facility] NP orders to have patient discuss with PCP/Cardiologist asap. He is currently wearing an event monitor for Cardiology they are aware of his falls/dizziness. PT STABLE AT TIME OF DISCHARGE

## 2021-05-19 ENCOUNTER — Encounter: Payer: Self-pay | Admitting: Oncology

## 2021-05-20 DIAGNOSIS — R42 Dizziness and giddiness: Secondary | ICD-10-CM

## 2021-05-26 ENCOUNTER — Inpatient Hospital Stay: Payer: Medicare Other | Admitting: Oncology

## 2021-05-26 ENCOUNTER — Inpatient Hospital Stay: Payer: Medicare Other

## 2021-05-26 MED FILL — Dexamethasone Sodium Phosphate Inj 100 MG/10ML: INTRAMUSCULAR | Qty: 1 | Status: AC

## 2021-05-30 ENCOUNTER — Inpatient Hospital Stay: Payer: Medicare Other

## 2021-05-30 NOTE — Progress Notes (Signed)
[facility]  [address] [place],  [place]  [address] [phone]  Clinic Day: 05/31/2021  Referring physician: Galvin Proffer, MD  This document serves as a record of services personally performed by Weston Settle, MD. It was created on their behalf by Curry,Lauren E, a trained medical scribe. The creation of this record is based on the scribe's personal observations and the provider's statements to them.  HISTORY OF PRESENT ILLNESS:  The patient is a 80 y.o. 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  He comes in today prior to his 10th cycle of chemotherapy, which now consists of maintenance gemcitabine.  Cisplatin was discontinued after 8 cycles.  The patient claims to have tolerated his last cycle of treatment fairly well.  He denies abdominal pain, distention or other findings which concern him for overt signs of disease progression.   However, he has been more lightheaded recently to where he has nearly fallen.  He is compliant with his cardiac medications, particularly as he has a fairly recent heart attack.  He believes he may be overly medicated, which is leading to his symptoms.    PHYSICAL EXAM:  Blood pressure 121/63, pulse 86, temperature 99.2 F (37.3 C), resp. rate 18, height 6\' 2"  (1.88 m), weight 260 lb 6.4 oz (118.1 kg), SpO2 90 %. His blood pressure dropped to 91/52 upon standing Wt Readings from Last 3 Encounters:  05/31/21 260 lb 6.4 oz (118.1 kg)  05/31/21 260 lb 6.4 oz (118.1 kg)  05/16/21 265 lb 4 oz (120.3 kg)   Body mass index is 33.43 kg/m. Performance status (ECOG): 1 - Symptomatic but completely ambulatory Physical Exam Constitutional:      General: He is not in acute distress.    Appearance: Normal appearance. He is normal weight.  HENT:     Head: Normocephalic and atraumatic.  Eyes:      General: No scleral icterus.    Extraocular Movements: Extraocular movements intact.     Conjunctiva/sclera: Conjunctivae normal.     Pupils: Pupils are equal, round, and reactive to light.  Cardiovascular:     Rate and Rhythm: Normal rate and regular rhythm.     Pulses: Normal pulses.     Heart sounds: Normal heart sounds. No murmur heard.   No friction rub. No gallop.  Pulmonary:     Effort: Pulmonary effort is normal. No respiratory distress.     Breath sounds: Normal breath sounds.  Abdominal:     General: Bowel sounds are normal. There is no distension.     Palpations: Abdomen is soft. There is no hepatomegaly, splenomegaly or mass.     Tenderness: There is no abdominal tenderness.  Musculoskeletal:        General: Normal range of motion.     Cervical back: Normal range of motion and neck supple.     Right lower leg: No edema.     Left lower leg: No edema.  Lymphadenopathy:     Cervical: No cervical adenopathy.  Skin:    General: Skin is warm and dry.  Neurological:     General: No focal deficit present.     Mental Status: He is alert and oriented to person, place, and time. Mental status is at baseline.  Psychiatric:        Mood and Affect: Mood normal.        Behavior: Behavior normal.  Thought Content: Thought content normal.        Judgment: Judgment normal.    LABS:    Ref. Range 05/31/2021 00:00  Sodium Latest Ref Range: 137 - 147  144  Potassium Latest Ref Range: 3.4 - 5.3  4.3  Chloride Latest Ref Range: 99 - 108  108  CO2 Latest Ref Range: 13 - 22  26 (A)  Glucose Unknown 116  BUN Latest Ref Range: 4 - 21  19  Creatinine Latest Ref Range: 0.6 - 1.3  1.5 (A)  Calcium Latest Ref Range: 8.7 - 10.7  8.6 (A)  Alkaline Phosphatase Latest Ref Range: 25 - 125  60  Albumin Latest Ref Range: 3.5 - 5.0  3.8  AST Latest Ref Range: 14 - 40  28  ALT Latest Ref Range: 10 - 40  15  Bilirubin, Total Unknown 0.5  WBC Unknown 5.7  RBC Latest Ref Range: 3.87 - 5.11   2.92 (A)  Hemoglobin Latest Ref Range: 13.5 - 17.5  9.1 (A)  HCT Latest Ref Range: 41 - 53  29 (A)  Platelets Latest Ref Range: 150 - 399  161  NEUT# Unknown 3.93    ASSESSMENT & PLAN:  Assessment/Plan:  A 80 y.o. male with metastatic FGFR2 mutation positive cholangiocarcinoma.  He will proceed with his 10th cycle of treatment next week. His treatments will remain spaced out to where he will be on a day 1,15 regimen, with each cycle being spaced out to every 4 weeks.  This is being done to give his bone marrow more time to reproduce blood cells before he scheduled for a subsequent treatment.  This gentleman is clearly orthostatic today.  Based upon this, I will arrange for him to receive 2L of IV fluids today.  He knows it will be imperative for him to maintain ideal fluid intake.  As his hemoglobin is below 10, I will arrange for him to receive Retacrit 20,000 units with this cycle of treatment.  I will see him back in 4 weeks before he heads into his 11th cycle of therapy.  He understands all the plans discussed today and is in agreement with them.    I, Rita Ohara, am acting as scribe for [name], MD    I have reviewed this report as typed by the medical scribe, and it is complete and accurate.  Skylene Deremer Macarthur Critchley, MD

## 2021-05-31 ENCOUNTER — Telehealth: Payer: Self-pay

## 2021-05-31 ENCOUNTER — Other Ambulatory Visit: Payer: Self-pay | Admitting: Hematology and Oncology

## 2021-05-31 ENCOUNTER — Inpatient Hospital Stay (INDEPENDENT_AMBULATORY_CARE_PROVIDER_SITE_OTHER): Payer: Medicare Other | Admitting: Oncology

## 2021-05-31 ENCOUNTER — Other Ambulatory Visit: Payer: Self-pay

## 2021-05-31 ENCOUNTER — Inpatient Hospital Stay: Payer: Medicare Other | Attending: Oncology

## 2021-05-31 ENCOUNTER — Inpatient Hospital Stay: Payer: Medicare Other

## 2021-05-31 VITALS — BP 121/63 | HR 86 | Temp 99.2°F | Resp 18 | Ht 74.0 in | Wt 260.4 lb

## 2021-05-31 VITALS — BP 106/56 | HR 84 | Temp 98.1°F | Resp 18 | Ht 74.0 in | Wt 260.4 lb

## 2021-05-31 DIAGNOSIS — C240 Malignant neoplasm of extrahepatic bile duct: Secondary | ICD-10-CM | POA: Diagnosis not present

## 2021-05-31 DIAGNOSIS — I252 Old myocardial infarction: Secondary | ICD-10-CM | POA: Diagnosis not present

## 2021-05-31 DIAGNOSIS — C787 Secondary malignant neoplasm of liver and intrahepatic bile duct: Secondary | ICD-10-CM | POA: Diagnosis not present

## 2021-05-31 DIAGNOSIS — C221 Intrahepatic bile duct carcinoma: Secondary | ICD-10-CM | POA: Diagnosis not present

## 2021-05-31 DIAGNOSIS — C786 Secondary malignant neoplasm of retroperitoneum and peritoneum: Secondary | ICD-10-CM | POA: Diagnosis not present

## 2021-05-31 DIAGNOSIS — Z79899 Other long term (current) drug therapy: Secondary | ICD-10-CM | POA: Insufficient documentation

## 2021-05-31 DIAGNOSIS — Z5111 Encounter for antineoplastic chemotherapy: Secondary | ICD-10-CM | POA: Diagnosis present

## 2021-05-31 DIAGNOSIS — T451X5A Adverse effect of antineoplastic and immunosuppressive drugs, initial encounter: Secondary | ICD-10-CM

## 2021-05-31 DIAGNOSIS — D6481 Anemia due to antineoplastic chemotherapy: Secondary | ICD-10-CM

## 2021-05-31 LAB — BASIC METABOLIC PANEL
BUN: 19 (ref 4–21)
CO2: 26 — AB (ref 13–22)
Chloride: 108 (ref 99–108)
Creatinine: 1.5 — AB (ref 0.6–1.3)
Glucose: 116
Potassium: 4.3 (ref 3.4–5.3)
Sodium: 144 (ref 137–147)

## 2021-05-31 LAB — CBC AND DIFFERENTIAL
HCT: 29 — AB (ref 41–53)
Hemoglobin: 9.1 — AB (ref 13.5–17.5)
Neutrophils Absolute: 3.93
Platelets: 161 (ref 150–399)
WBC: 5.7

## 2021-05-31 LAB — MAGNESIUM: Magnesium, Serum: 1.7

## 2021-05-31 LAB — HEPATIC FUNCTION PANEL
ALT: 15 (ref 10–40)
AST: 28 (ref 14–40)
Alkaline Phosphatase: 60 (ref 25–125)
Bilirubin, Total: 0.5

## 2021-05-31 LAB — CBC: RBC: 2.92 — AB (ref 3.87–5.11)

## 2021-05-31 LAB — COMPREHENSIVE METABOLIC PANEL
Albumin: 3.8 (ref 3.5–5.0)
Calcium: 8.6 — AB (ref 8.7–10.7)

## 2021-05-31 MED ORDER — SODIUM CHLORIDE 0.9 % IV SOLN: INTRAVENOUS | Status: DC

## 2021-05-31 MED ORDER — SODIUM CHLORIDE 0.9% FLUSH
10.0000 mL | Freq: Once | INTRAVENOUS | Status: AC | PRN
  Administered 2021-05-31: 10 mL

## 2021-05-31 NOTE — Telephone Encounter (Signed)
Left message on patients voicemail to please return our call.   

## 2021-05-31 NOTE — Patient Instructions (Signed)

## 2021-05-31 NOTE — Telephone Encounter (Signed)
-----   Message from Park Liter, MD sent at 05/31/2021 12:26 PM EDT ----- Monitor show 1 episode of supraventricular tachycardia which was asymptomatic, patient got 25 triggered events during the monitoring all 4 sinus rhythm and sinus tachycardia

## 2021-06-01 ENCOUNTER — Telehealth: Payer: Self-pay

## 2021-06-01 MED FILL — Gemcitabine HCl Inj 1 GM/26.3ML (38 MG/ML) (Base Equiv): INTRAVENOUS | Qty: 49 | Status: AC

## 2021-06-01 NOTE — Telephone Encounter (Signed)
-----   Message from Park Liter, MD sent at 05/31/2021 12:26 PM EDT ----- Monitor show 1 episode of supraventricular tachycardia which was asymptomatic, patient got 25 triggered events during the monitoring all 4 sinus rhythm and sinus tachycardia

## 2021-06-01 NOTE — Telephone Encounter (Signed)
Left message on patients voicemail to please return our call.   

## 2021-06-02 ENCOUNTER — Other Ambulatory Visit: Payer: Self-pay

## 2021-06-02 ENCOUNTER — Inpatient Hospital Stay: Payer: Medicare Other

## 2021-06-02 ENCOUNTER — Telehealth: Payer: Self-pay

## 2021-06-02 ENCOUNTER — Encounter: Payer: Self-pay | Admitting: Oncology

## 2021-06-02 VITALS — BP 138/63 | HR 83 | Temp 98.0°F | Resp 18 | Ht 74.0 in | Wt 264.0 lb

## 2021-06-02 DIAGNOSIS — D6481 Anemia due to antineoplastic chemotherapy: Secondary | ICD-10-CM

## 2021-06-02 DIAGNOSIS — Z5111 Encounter for antineoplastic chemotherapy: Secondary | ICD-10-CM | POA: Diagnosis not present

## 2021-06-02 DIAGNOSIS — T451X5A Adverse effect of antineoplastic and immunosuppressive drugs, initial encounter: Secondary | ICD-10-CM

## 2021-06-02 DIAGNOSIS — C240 Malignant neoplasm of extrahepatic bile duct: Secondary | ICD-10-CM

## 2021-06-02 MED ORDER — EPOETIN ALFA-EPBX 10000 UNIT/ML IJ SOLN
10000.0000 [IU] | Freq: Once | INTRAMUSCULAR | Status: AC
  Administered 2021-06-02: 10000 [IU] via SUBCUTANEOUS
  Filled 2021-06-02: qty 1

## 2021-06-02 MED ORDER — SODIUM CHLORIDE 0.9 % IV SOLN
10.0000 mg | Freq: Once | INTRAVENOUS | Status: AC
  Administered 2021-06-02: 10 mg via INTRAVENOUS
  Filled 2021-06-02 (×2): qty 1

## 2021-06-02 MED ORDER — SODIUM CHLORIDE 0.9 % IV SOLN
Freq: Once | INTRAVENOUS | Status: AC
Start: 2021-06-02 — End: 2021-06-02

## 2021-06-02 MED ORDER — SODIUM CHLORIDE 0.9 % IV SOLN
750.0000 mg/m2 | Freq: Once | INTRAVENOUS | Status: AC
  Administered 2021-06-02: 1862 mg via INTRAVENOUS
  Filled 2021-06-02: qty 48.97

## 2021-06-02 MED ORDER — SODIUM CHLORIDE 0.9% FLUSH
10.0000 mL | INTRAVENOUS | Status: DC | PRN
  Administered 2021-06-02: 10 mL

## 2021-06-02 NOTE — Patient Instructions (Signed)
Gemcitabine injection What is this medication? GEMCITABINE (jem SYE ta been) is a chemotherapy drug. This medicine is used to treat many types of cancer like breast cancer, lung cancer, pancreatic cancer,and ovarian cancer. This medicine may be used for other purposes; ask your health care provider orpharmacist if you have questions. COMMON BRAND NAME(S): Gemzar, Infugem What should I tell my care team before I take this medication? They need to know if you have any of these conditions: blood disorders infection kidney disease liver disease lung or breathing disease, like asthma recent or ongoing radiation therapy an unusual or allergic reaction to gemcitabine, other chemotherapy, other medicines, foods, dyes, or preservatives pregnant or trying to get pregnant breast-feeding How should I use this medication? This drug is given as an infusion into a vein. It is administered in a hospitalor clinic by a specially trained health care professional. Talk to your pediatrician regarding the use of this medicine in children.Special care may be needed. Overdosage: If you think you have taken too much of this medicine contact apoison control center or emergency room at once. NOTE: This medicine is only for you. Do not share this medicine with others. What if I miss a dose? It is important not to miss your dose. Call your doctor or health careprofessional if you are unable to keep an appointment. What may interact with this medication? medicines to increase blood counts like filgrastim, pegfilgrastim, sargramostim some other chemotherapy drugs like cisplatin vaccines Talk to your doctor or health care professional before taking any of thesemedicines: acetaminophen aspirin ibuprofen ketoprofen naproxen This list may not describe all possible interactions. Give your health care provider a list of all the medicines, herbs, non-prescription drugs, or dietary supplements you use. Also tell them if  you smoke, drink alcohol, or use illegaldrugs. Some items may interact with your medicine. What should I watch for while using this medication? Visit your doctor for checks on your progress. This drug may make you feel generally unwell. This is not uncommon, as chemotherapy can affect healthy cells as well as cancer cells. Report any side effects. Continue your course oftreatment even though you feel ill unless your doctor tells you to stop. In some cases, you may be given additional medicines to help with side effects.Follow all directions for their use. Call your doctor or health care professional for advice if you get a fever, chills or sore throat, or other symptoms of a cold or flu. Do not treat yourself. This drug decreases your body's ability to fight infections. Try toavoid being around people who are sick. This medicine may increase your risk to bruise or bleed. Call your doctor orhealth care professional if you notice any unusual bleeding. Be careful brushing and flossing your teeth or using a toothpick because you may get an infection or bleed more easily. If you have any dental work done,tell your dentist you are receiving this medicine. Avoid taking products that contain aspirin, acetaminophen, ibuprofen, naproxen, or ketoprofen unless instructed by your doctor. These medicines may hide afever. Do not become pregnant while taking this medicine or for 6 months after stopping it. Women should inform their doctor if they wish to become pregnant or think they might be pregnant. Men should not father a child while taking this medicine and for 3 months after stopping it. There is a potential for serious side effects to an unborn child. Talk to your health care professional or pharmacist for more information. Do not breast-feed an infant while takingthis medicine or   for at least 1 week after stopping it. Men should inform their doctors if they wish to father a child. This medicine may lower sperm  counts. Talk with your doctor or health care professional ifyou are concerned about your fertility. What side effects may I notice from receiving this medication? Side effects that you should report to your doctor or health care professionalas soon as possible: allergic reactions like skin rash, itching or hives, swelling of the face, lips, or tongue breathing problems pain, redness, or irritation at site where injected signs and symptoms of a dangerous change in heartbeat or heart rhythm like chest pain; dizziness; fast or irregular heartbeat; palpitations; feeling faint or lightheaded, falls; breathing problems signs of decreased platelets or bleeding - bruising, pinpoint red spots on the skin, black, tarry stools, blood in the urine signs of decreased red blood cells - unusually weak or tired, feeling faint or lightheaded, falls signs of infection - fever or chills, cough, sore throat, pain or difficulty passing urine signs and symptoms of kidney injury like trouble passing urine or change in the amount of urine signs and symptoms of liver injury like dark yellow or brown urine; general ill feeling or flu-like symptoms; light-colored stools; loss of appetite; nausea; right upper belly pain; unusually weak or tired; yellowing of the eyes or skin swelling of ankles, feet, hands Side effects that usually do not require medical attention (report to yourdoctor or health care professional if they continue or are bothersome): constipation diarrhea hair loss loss of appetite nausea rash vomiting This list may not describe all possible side effects. Call your doctor for medical advice about side effects. You may report side effects to FDA at1-800-FDA-1088. Where should I keep my medication? This drug is given in a hospital or clinic and will not be stored at home. NOTE: This sheet is a summary. It may not cover all possible information. If you have questions about this medicine, talk to your  doctor, pharmacist, orhealth care provider.  2022 Elsevier/Gold Standard (2017-12-04 18:06:11)  

## 2021-06-02 NOTE — Addendum Note (Signed)
Addended by: Juanetta Beets on: 06/02/2021 09:44 AM   Modules accepted: Orders

## 2021-06-02 NOTE — Telephone Encounter (Signed)
-----   Message from Park Liter, MD sent at 05/31/2021 12:26 PM EDT ----- Monitor show 1 episode of supraventricular tachycardia which was asymptomatic, patient got 25 triggered events during the monitoring all 4 sinus rhythm and sinus tachycardia

## 2021-06-02 NOTE — Telephone Encounter (Signed)
Left message on patients voicemail to please return our call.   Letter mailed to patient at this time.  

## 2021-06-02 NOTE — Addendum Note (Signed)
Addended by: Juanetta Beets on: 06/02/2021 09:49 AM   Modules accepted: Orders

## 2021-06-06 NOTE — Telephone Encounter (Signed)
Pt returning phone call, please advise.Marland Kitchen

## 2021-06-09 ENCOUNTER — Encounter: Payer: Self-pay | Admitting: Oncology

## 2021-06-09 ENCOUNTER — Telehealth: Payer: Self-pay | Admitting: Oncology

## 2021-06-09 IMAGING — DX DG CHEST 2V
2 series · 2 of 2 positions shown · non-contrast
Comparison: CTA from earlier in the same day.

CLINICAL DATA: Chest pain

EXAM:
CHEST - 2 VIEW

[w chest pa]
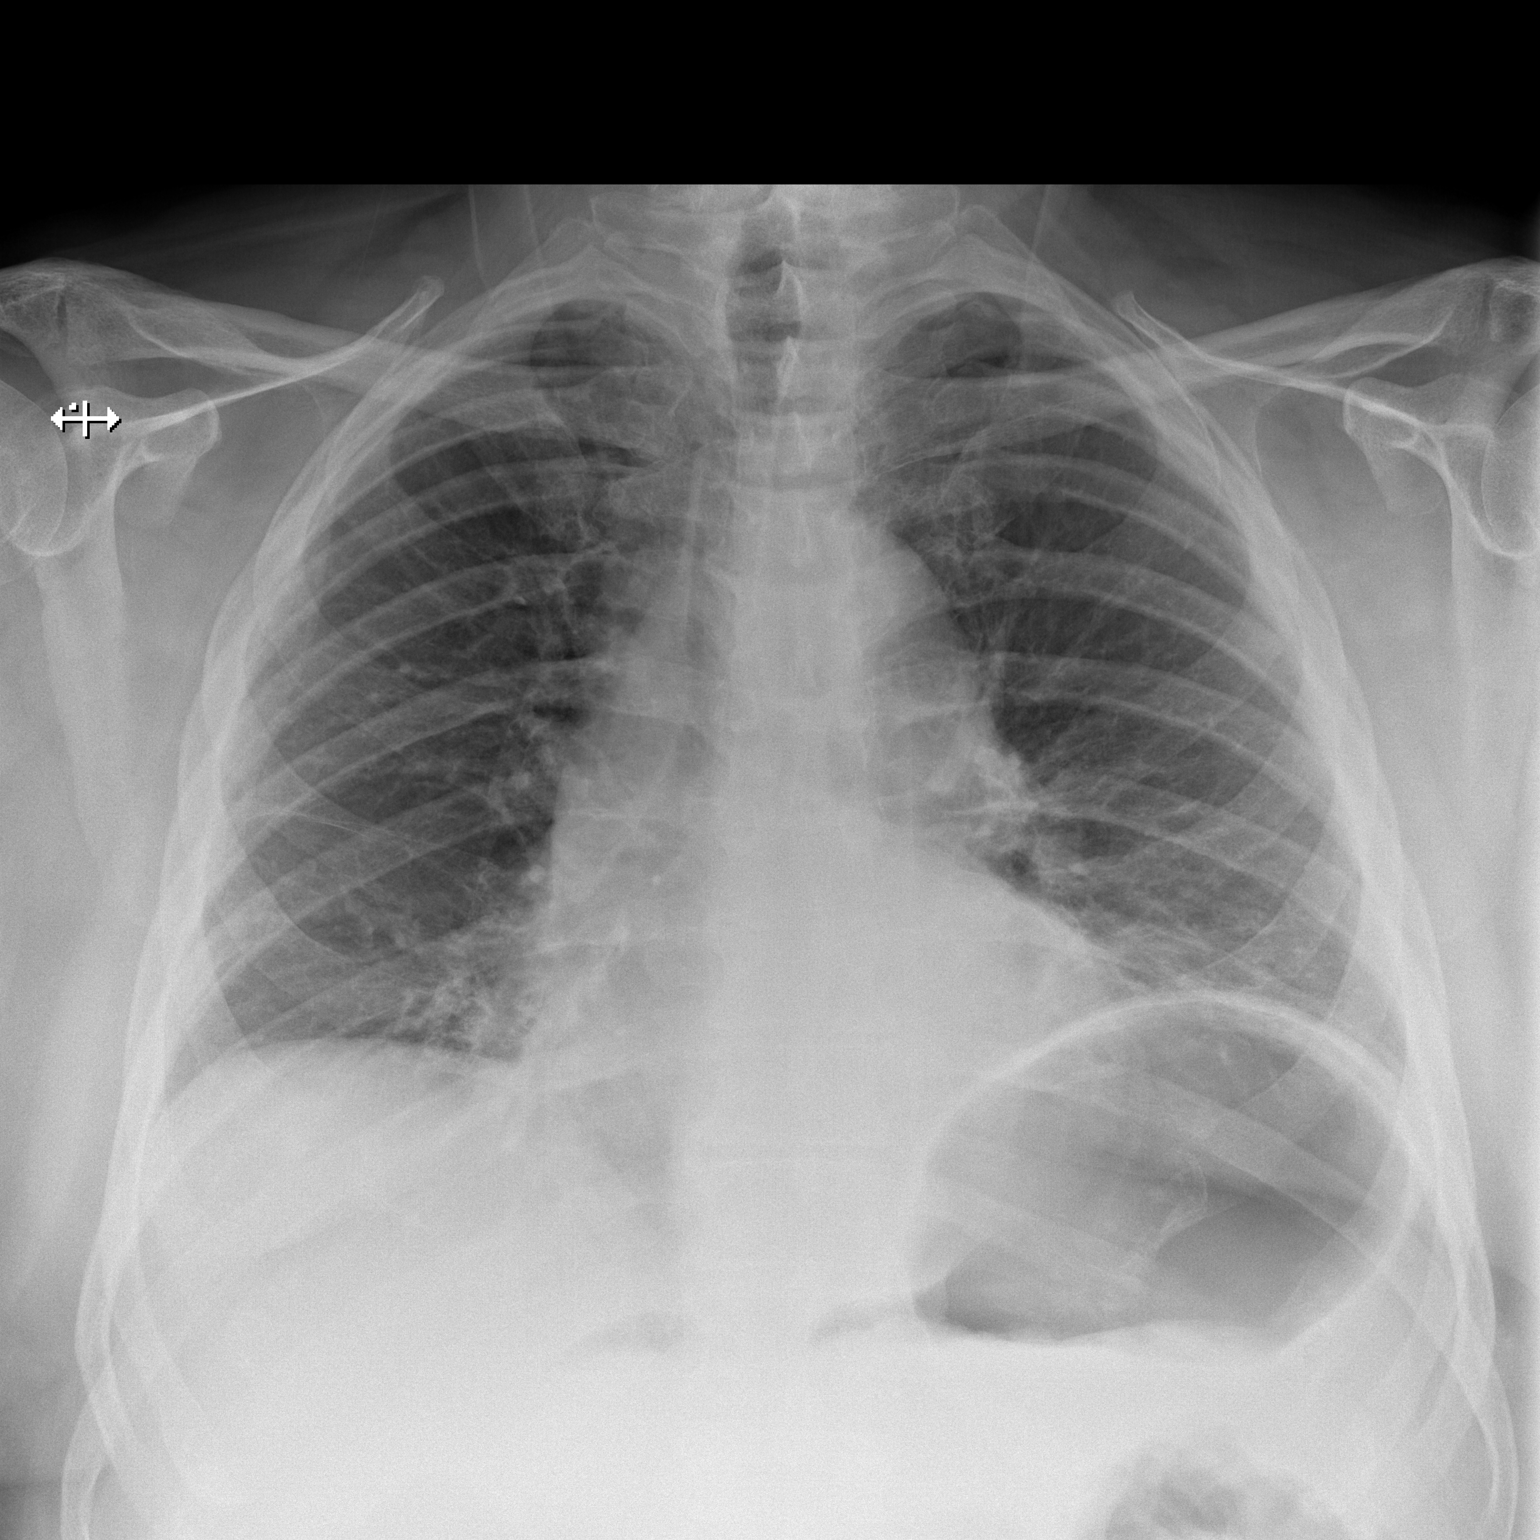

[w chest lat]
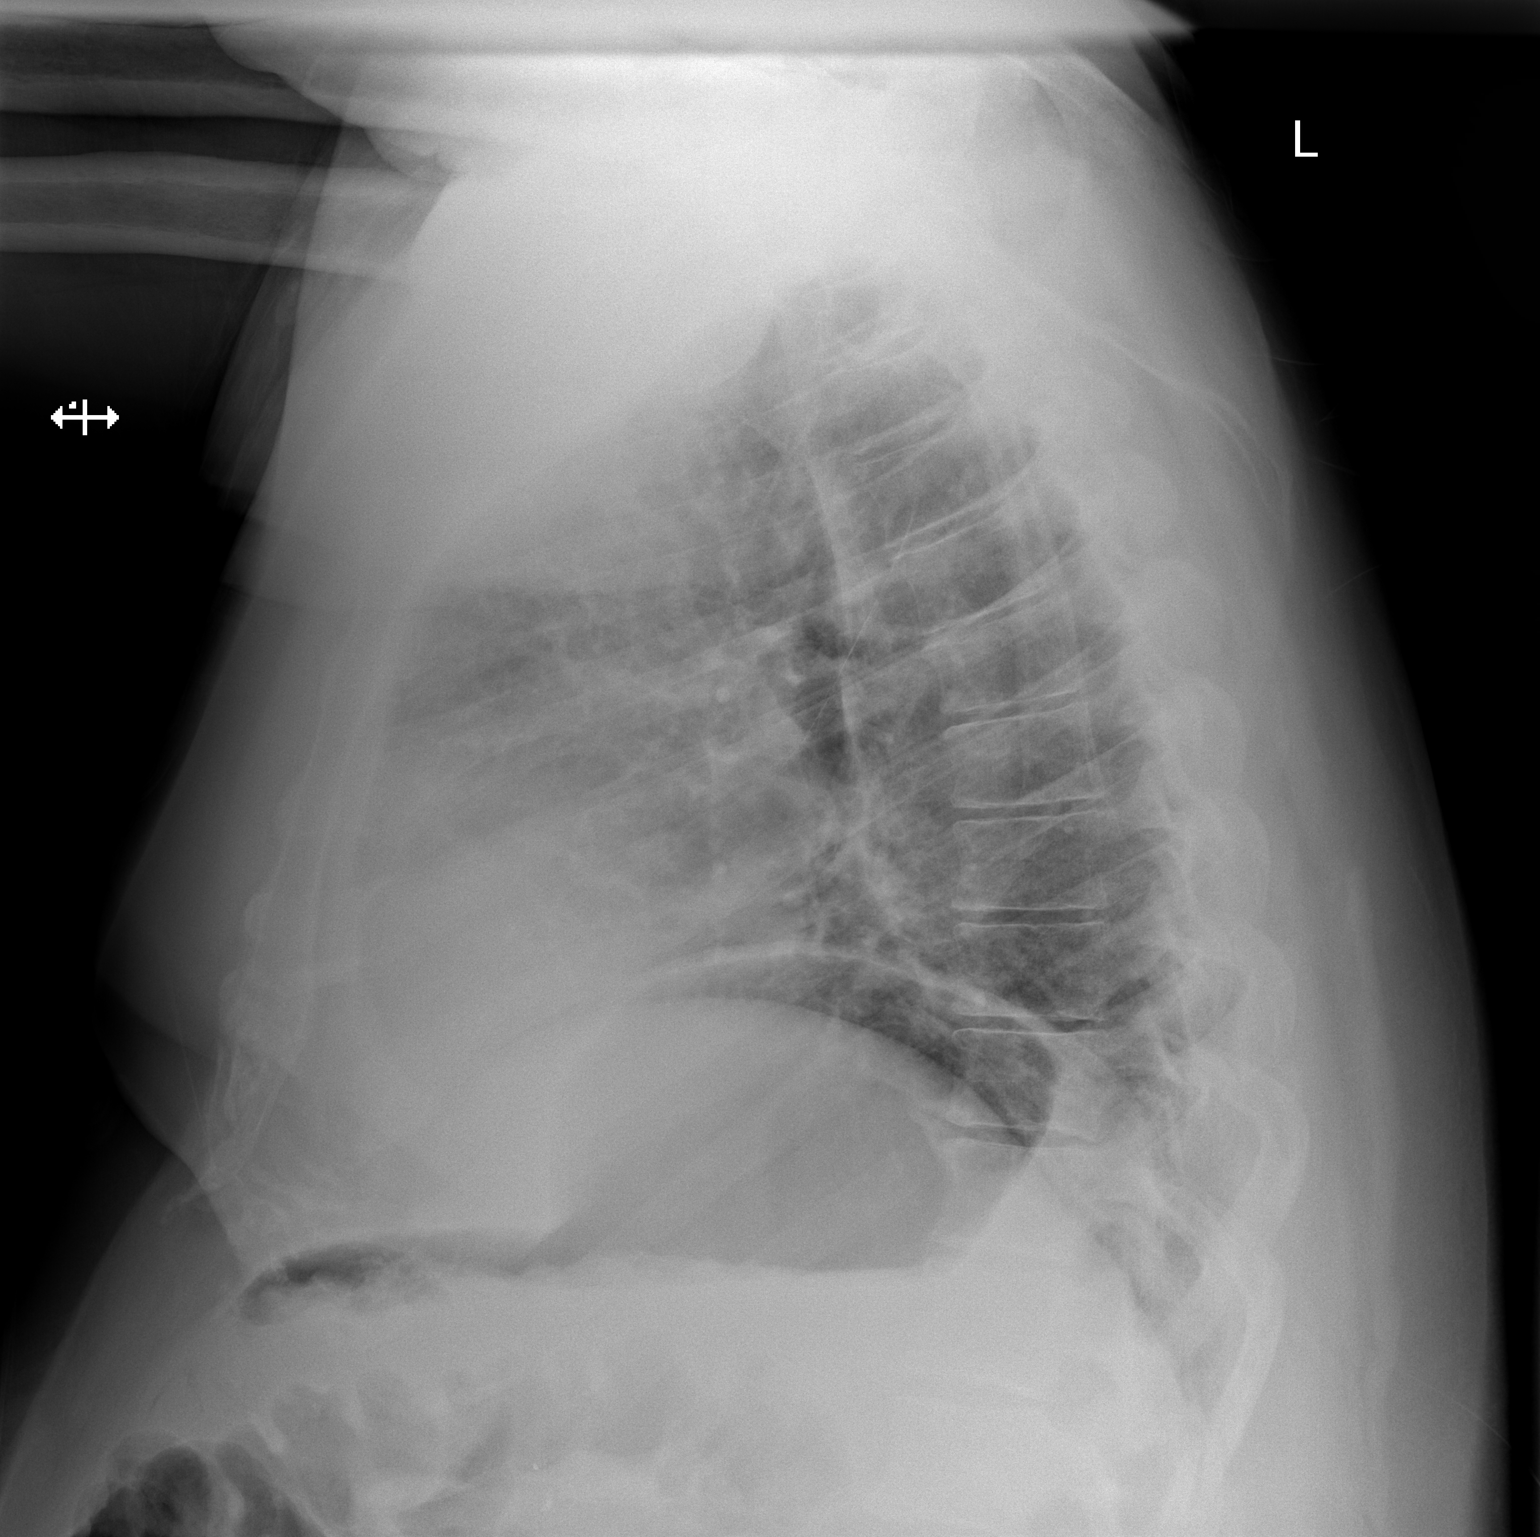

[2 of 2 positions shown; findings below may reference images not displayed]

FINDINGS: Cardiac shadow is within normal limits. Lungs are well aerated
bilaterally. No focal infiltrate or sizable effusion is seen. No
bony abnormality is noted.
IMPRESSION: No acute abnormality seen.

## 2021-06-09 IMAGING — CT CT ANGIO CHEST
2 of 9 series · 17 of 36 positions shown · IV contrast (Omnipaque)
Comparison: 05/09/2020 PET-CT

CLINICAL DATA: Chest pain and shortness of breath

EXAM:
CT ANGIOGRAPHY CHEST WITH CONTRAST
TECHNIQUE: Multidetector CT imaging of the chest was performed using the
standard protocol during bolus administration of intravenous
contrast. Multiplanar CT image reconstructions and MIPs were
obtained to evaluate the vascular anatomy.
CONTRAST:  73mL OMNIPAQUE IOHEXOL 350 MG/ML SOLN

[Series 5: pe thins · axial · 0.85mm/px · z∈[-333,-46]mm · 16 of 435 slices shown]
[im 26/435  lung]
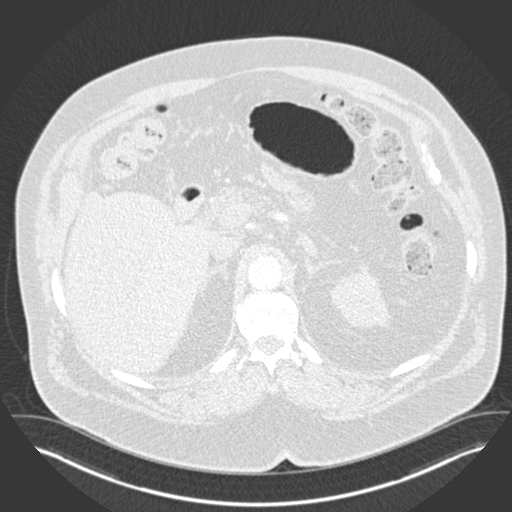
[im 52/435  mediastinal]
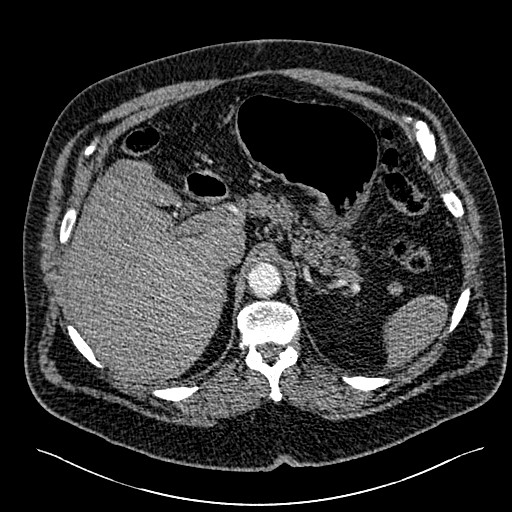
[im 77/435  lung]
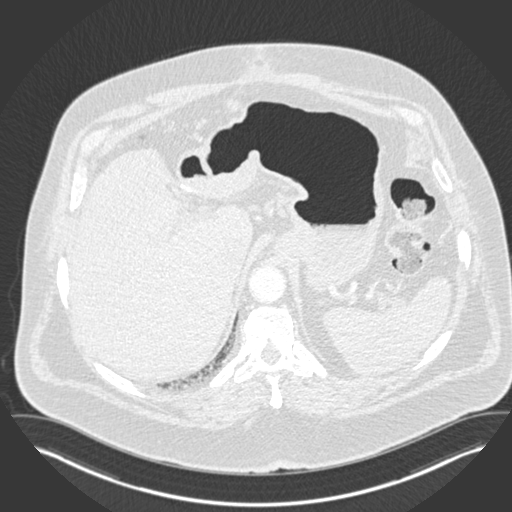
[im 103/435  mediastinal]
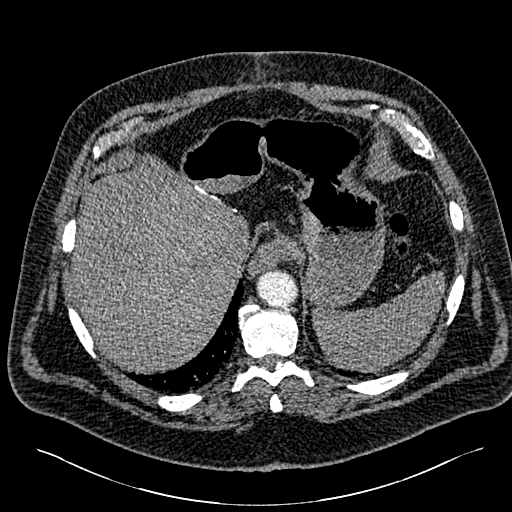
[im 128/435  lung]
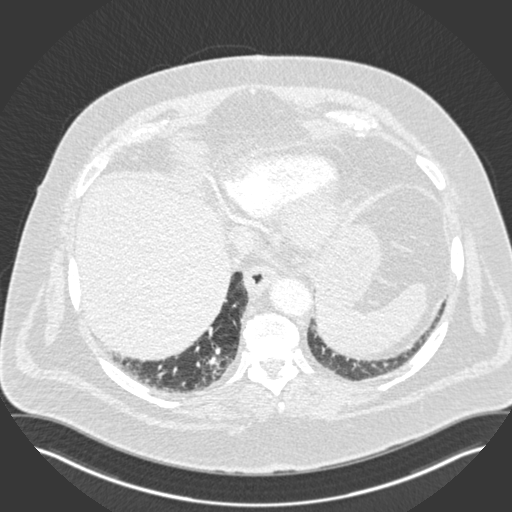
[im 154/435  mediastinal]
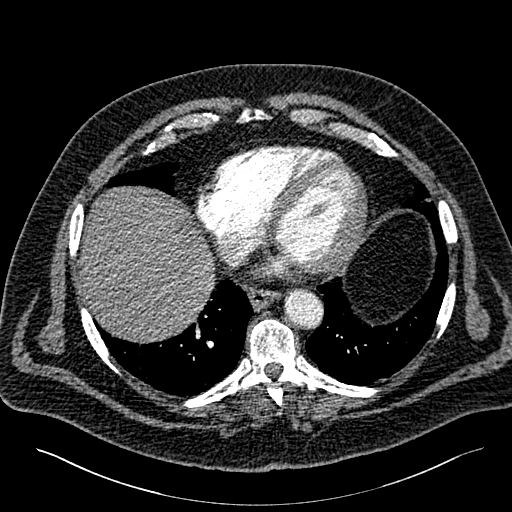
[im 179/435  lung]
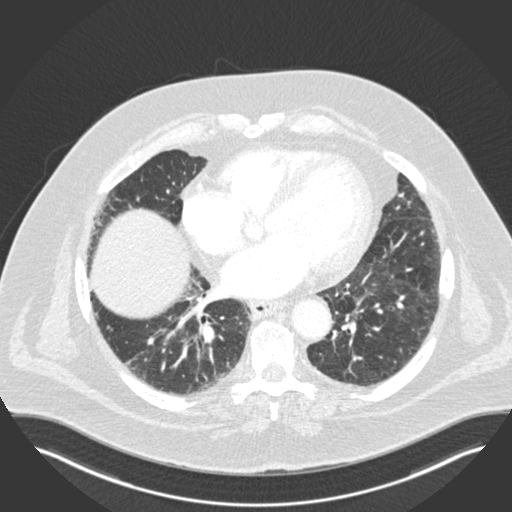
[im 205/435  mediastinal]
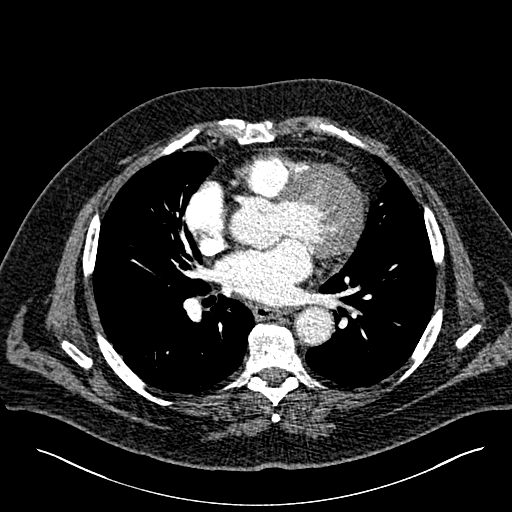
[im 230/435  lung]
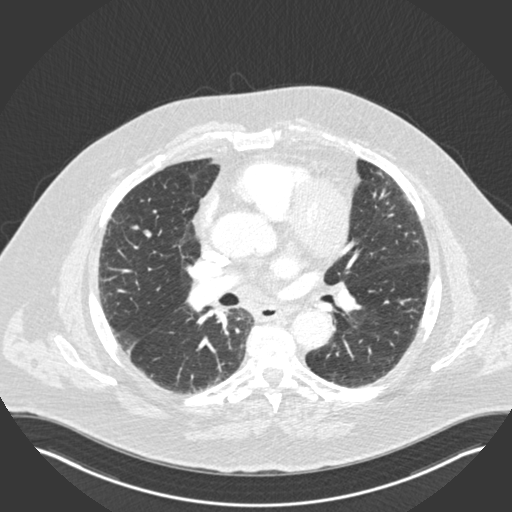
[im 256/435  mediastinal]
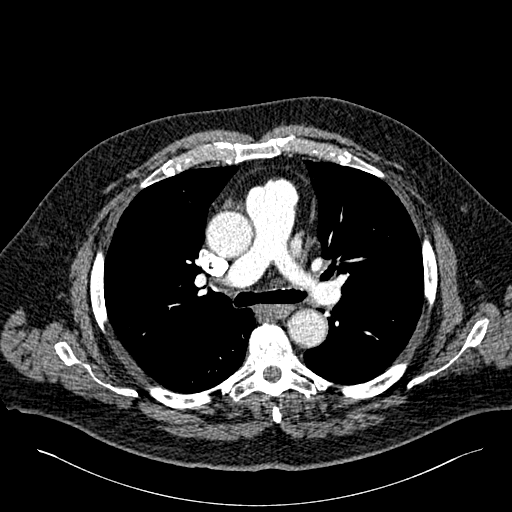
[im 281/435  lung]
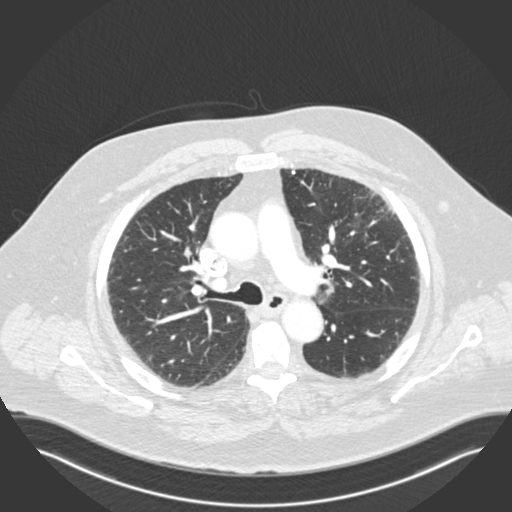
[im 307/435  mediastinal]
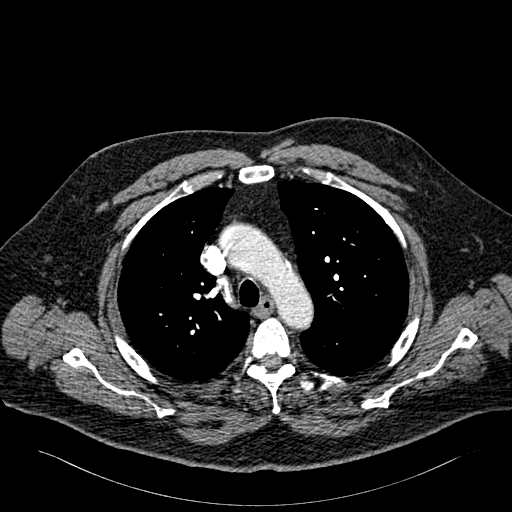
[im 332/435  lung]
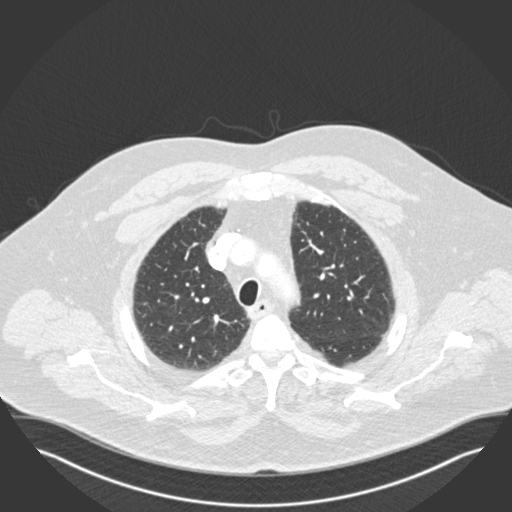
[im 358/435  mediastinal]
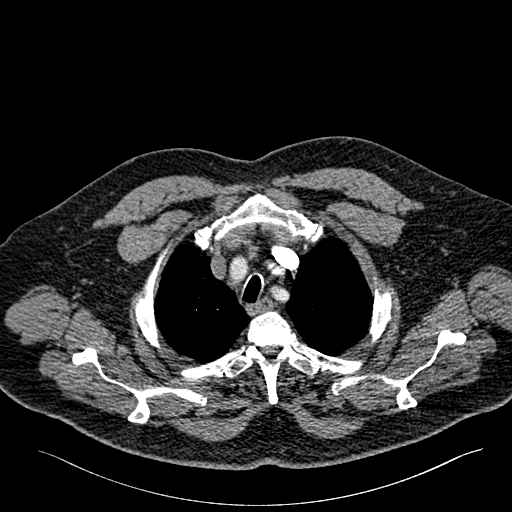
[im 383/435  lung]
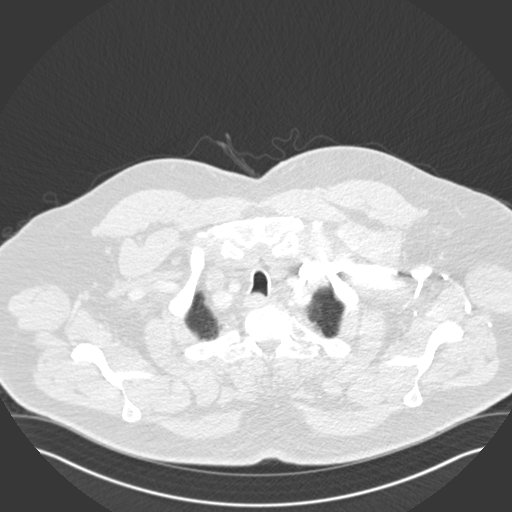
[im 409/435  mediastinal]
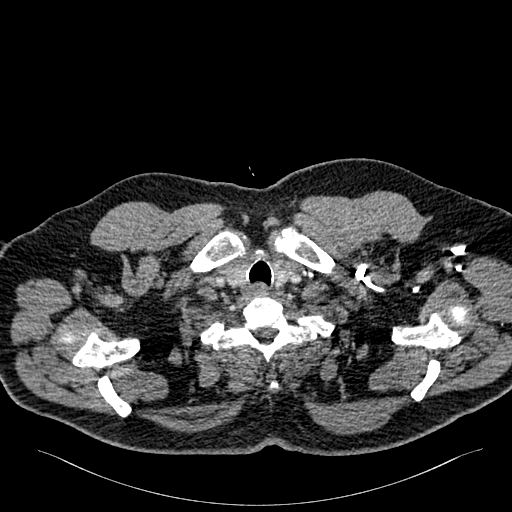

[Series 7: pe coronal mpr · coronal · 0.67mm/px · 1 of 115 slices shown]
[im 58/115  mediastinal]
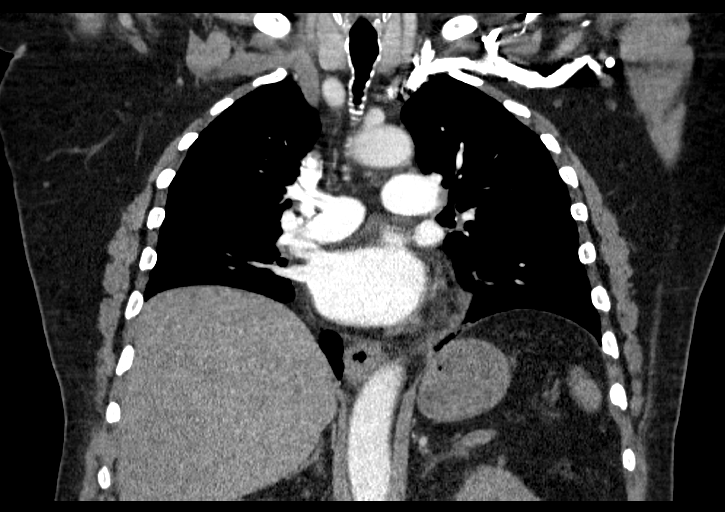

[17 of 36 positions shown; findings below may reference images not displayed]

FINDINGS: Cardiovascular: Thoracic aorta demonstrates atherosclerotic
calcifications without aneurysmal dilatation or dissection. No
cardiac enlargement is seen. The coronary arteries demonstrate
diffuse calcifications. The pulmonary artery shows a normal
branching pattern without definitive filling defect to suggest
pulmonary embolism.

Mediastinum/Nodes: Thoracic inlet is within normal limits. No
sizable hilar or mediastinal adenopathy is noted. The esophagus as
visualized is normal limits.

Lungs/Pleura: The lungs are well aerated bilaterally. No focal
infiltrate or sizable effusion is seen. No pneumothorax or sizable
parenchymal nodule is noted. Some stable perivascular nodularity is
noted unchanged from 9775 consistent with a benign etiology.

Upper Abdomen: Visualized upper abdomen shows changes of prior
cholecystectomy. An area of inflammatory changes noted in the
subcutaneous tissues in an area of prior hypermetabolic nodule. A
small lymph node is noted in the abdomen anterior to the stomach
stable in appearance from the prior exam.

Musculoskeletal: Degenerative changes of the thoracic spine are
noted.

Review of the MIP images confirms the above findings.
IMPRESSION: No evidence of pulmonary emboli.

Stable nodularity within the right middle lobe unchanged from 9775
consistent with a benign etiology.

Previously seen nodularity in the anterior abdominal wall is less
prominent with a small fluid collection identified. Correlate with
any recent excision in this region.

Aortic Atherosclerosis ([id number]).

## 2021-06-09 NOTE — Telephone Encounter (Signed)
Returning patient's phone call

## 2021-06-14 ENCOUNTER — Other Ambulatory Visit: Payer: Self-pay | Admitting: Hematology and Oncology

## 2021-06-14 ENCOUNTER — Inpatient Hospital Stay: Payer: Medicare Other

## 2021-06-14 DIAGNOSIS — C240 Malignant neoplasm of extrahepatic bile duct: Secondary | ICD-10-CM

## 2021-06-14 LAB — BASIC METABOLIC PANEL
BUN: 16 (ref 4–21)
CO2: 28 — AB (ref 13–22)
Chloride: 107 (ref 99–108)
Creatinine: 1.2 (ref 0.6–1.3)
Glucose: 103
Potassium: 3.9 (ref 3.4–5.3)
Sodium: 143 (ref 137–147)

## 2021-06-14 LAB — CBC AND DIFFERENTIAL
HCT: 30 — AB (ref 41–53)
Hemoglobin: 9.8 — AB (ref 13.5–17.5)
Neutrophils Absolute: 3.74
Platelets: 105 — AB (ref 150–399)
WBC: 5.2

## 2021-06-14 LAB — CBC
MCV: 97 — AB (ref 80–94)
RBC: 3.05 — AB (ref 3.87–5.11)

## 2021-06-14 LAB — COMPREHENSIVE METABOLIC PANEL
Albumin: 3.9 (ref 3.5–5.0)
Calcium: 9.8 (ref 8.7–10.7)

## 2021-06-14 LAB — MAGNESIUM: Magnesium: 1.8

## 2021-06-14 LAB — HEPATIC FUNCTION PANEL
ALT: 16 (ref 10–40)
AST: 30 (ref 14–40)
Alkaline Phosphatase: 58 (ref 25–125)
Bilirubin, Total: 0.5

## 2021-06-15 MED FILL — Dexamethasone Sodium Phosphate Inj 100 MG/10ML: INTRAMUSCULAR | Qty: 1 | Status: AC

## 2021-06-15 MED FILL — Gemcitabine HCl Inj 1 GM/26.3ML (38 MG/ML) (Base Equiv): INTRAVENOUS | Qty: 49 | Status: AC

## 2021-06-16 ENCOUNTER — Inpatient Hospital Stay: Payer: Medicare Other

## 2021-06-16 ENCOUNTER — Other Ambulatory Visit: Payer: Self-pay

## 2021-06-16 VITALS — BP 126/74 | HR 79 | Temp 98.1°F | Resp 18 | Ht 74.0 in | Wt 263.0 lb

## 2021-06-16 DIAGNOSIS — Z5111 Encounter for antineoplastic chemotherapy: Secondary | ICD-10-CM | POA: Diagnosis not present

## 2021-06-16 DIAGNOSIS — C240 Malignant neoplasm of extrahepatic bile duct: Secondary | ICD-10-CM

## 2021-06-16 MED ORDER — SODIUM CHLORIDE 0.9 % IV SOLN
Freq: Once | INTRAVENOUS | Status: AC
Start: 2021-06-16 — End: 2021-06-16

## 2021-06-16 MED ORDER — SODIUM CHLORIDE 0.9 % IV SOLN
750.0000 mg/m2 | Freq: Once | INTRAVENOUS | Status: AC
  Administered 2021-06-16: 1862 mg via INTRAVENOUS
  Filled 2021-06-16: qty 48.97

## 2021-06-16 MED ORDER — SODIUM CHLORIDE 0.9 % IV SOLN
10.0000 mg | Freq: Once | INTRAVENOUS | Status: AC
  Administered 2021-06-16: 10 mg via INTRAVENOUS
  Filled 2021-06-16: qty 10

## 2021-06-16 MED ORDER — SODIUM CHLORIDE 0.9% FLUSH
10.0000 mL | INTRAVENOUS | Status: DC | PRN
Start: 2021-06-16 — End: 2021-06-16
  Administered 2021-06-16: 10 mL

## 2021-06-16 NOTE — Patient Instructions (Signed)
Gemcitabine injection What is this medication? GEMCITABINE (jem SYE ta been) is a chemotherapy drug. This medicine is used to treat many types of cancer like breast cancer, lung cancer, pancreatic cancer,and ovarian cancer. This medicine may be used for other purposes; ask your health care provider orpharmacist if you have questions. COMMON BRAND NAME(S): Gemzar, Infugem What should I tell my care team before I take this medication? They need to know if you have any of these conditions: blood disorders infection kidney disease liver disease lung or breathing disease, like asthma recent or ongoing radiation therapy an unusual or allergic reaction to gemcitabine, other chemotherapy, other medicines, foods, dyes, or preservatives pregnant or trying to get pregnant breast-feeding How should I use this medication? This drug is given as an infusion into a vein. It is administered in a hospitalor clinic by a specially trained health care professional. Talk to your pediatrician regarding the use of this medicine in children.Special care may be needed. Overdosage: If you think you have taken too much of this medicine contact apoison control center or emergency room at once. NOTE: This medicine is only for you. Do not share this medicine with others. What if I miss a dose? It is important not to miss your dose. Call your doctor or health careprofessional if you are unable to keep an appointment. What may interact with this medication? medicines to increase blood counts like filgrastim, pegfilgrastim, sargramostim some other chemotherapy drugs like cisplatin vaccines Talk to your doctor or health care professional before taking any of thesemedicines: acetaminophen aspirin ibuprofen ketoprofen naproxen This list may not describe all possible interactions. Give your health care provider a list of all the medicines, herbs, non-prescription drugs, or dietary supplements you use. Also tell them if  you smoke, drink alcohol, or use illegaldrugs. Some items may interact with your medicine. What should I watch for while using this medication? Visit your doctor for checks on your progress. This drug may make you feel generally unwell. This is not uncommon, as chemotherapy can affect healthy cells as well as cancer cells. Report any side effects. Continue your course oftreatment even though you feel ill unless your doctor tells you to stop. In some cases, you may be given additional medicines to help with side effects.Follow all directions for their use. Call your doctor or health care professional for advice if you get a fever, chills or sore throat, or other symptoms of a cold or flu. Do not treat yourself. This drug decreases your body's ability to fight infections. Try toavoid being around people who are sick. This medicine may increase your risk to bruise or bleed. Call your doctor orhealth care professional if you notice any unusual bleeding. Be careful brushing and flossing your teeth or using a toothpick because you may get an infection or bleed more easily. If you have any dental work done,tell your dentist you are receiving this medicine. Avoid taking products that contain aspirin, acetaminophen, ibuprofen, naproxen, or ketoprofen unless instructed by your doctor. These medicines may hide afever. Do not become pregnant while taking this medicine or for 6 months after stopping it. Women should inform their doctor if they wish to become pregnant or think they might be pregnant. Men should not father a child while taking this medicine and for 3 months after stopping it. There is a potential for serious side effects to an unborn child. Talk to your health care professional or pharmacist for more information. Do not breast-feed an infant while takingthis medicine or   for at least 1 week after stopping it. Men should inform their doctors if they wish to father a child. This medicine may lower sperm  counts. Talk with your doctor or health care professional ifyou are concerned about your fertility. What side effects may I notice from receiving this medication? Side effects that you should report to your doctor or health care professionalas soon as possible: allergic reactions like skin rash, itching or hives, swelling of the face, lips, or tongue breathing problems pain, redness, or irritation at site where injected signs and symptoms of a dangerous change in heartbeat or heart rhythm like chest pain; dizziness; fast or irregular heartbeat; palpitations; feeling faint or lightheaded, falls; breathing problems signs of decreased platelets or bleeding - bruising, pinpoint red spots on the skin, black, tarry stools, blood in the urine signs of decreased red blood cells - unusually weak or tired, feeling faint or lightheaded, falls signs of infection - fever or chills, cough, sore throat, pain or difficulty passing urine signs and symptoms of kidney injury like trouble passing urine or change in the amount of urine signs and symptoms of liver injury like dark yellow or brown urine; general ill feeling or flu-like symptoms; light-colored stools; loss of appetite; nausea; right upper belly pain; unusually weak or tired; yellowing of the eyes or skin swelling of ankles, feet, hands Side effects that usually do not require medical attention (report to yourdoctor or health care professional if they continue or are bothersome): constipation diarrhea hair loss loss of appetite nausea rash vomiting This list may not describe all possible side effects. Call your doctor for medical advice about side effects. You may report side effects to FDA at1-800-FDA-1088. Where should I keep my medication? This drug is given in a hospital or clinic and will not be stored at home. NOTE: This sheet is a summary. It may not cover all possible information. If you have questions about this medicine, talk to your  doctor, pharmacist, orhealth care provider.  2022 Elsevier/Gold Standard (2017-12-04 18:06:11)  

## 2021-06-22 NOTE — Progress Notes (Signed)
[facility]  [address] [place],  [place]  [address] [phone]  Clinic Day: 06/29/2021  Referring physician: Bonnita Nasuti, MD  This document serves as a record of services personally performed by [name], MD. It was created on their behalf by Curry,Lauren E, a trained medical scribe. The creation of this record is based on the scribe's personal observations and the provider's statements to them.  HISTORY OF PRESENT ILLNESS:  The patient is a 80 y.o. 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  He comes in today prior to his 11th cycle of chemotherapy, which now consists of maintenance gemcitabine.  Cisplatin was discontinued after 8 cycles.  The patient claims to have tolerated his last cycle of treatment fairly well.  He denies abdominal pain, distention or other findings which concern him for overt signs of disease progression.   PHYSICAL EXAM:  Blood pressure (!) 109/53, pulse 74, temperature 98.1 F (36.7 C), resp. rate 16, height _0  (1.88 m), weight 254 lb 9.6 oz (115.5 kg), SpO2 91 %.  Wt Readings from Last 3 Encounters:  06/29/21 254 lb 9.6 oz (115.5 kg)  06/16/21 263 lb (119.3 kg)  06/02/21 264 lb (119.7 kg)   Body mass index is 32.69 kg/m. Performance status (ECOG): 1 - Symptomatic but completely ambulatory Physical Exam Constitutional:      General: He is not in acute distress.    Appearance: Normal appearance. He is normal weight.  HENT:     Head: Normocephalic and atraumatic.  Eyes:     General: No scleral icterus.    Extraocular Movements: Extraocular movements intact.     Conjunctiva/sclera: Conjunctivae normal.     Pupils: Pupils are equal, round, and reactive to light.  Cardiovascular:     Rate and Rhythm: Normal rate and regular rhythm.     Pulses: Normal pulses.     Heart sounds: Normal  heart sounds. No murmur heard.   No friction rub. No gallop.  Pulmonary:     Effort: Pulmonary effort is normal. No respiratory distress.     Breath sounds: Normal breath sounds.  Abdominal:     General: Bowel sounds are normal. There is no distension.     Palpations: Abdomen is soft. There is no hepatomegaly, splenomegaly or mass.     Tenderness: There is no abdominal tenderness.  Musculoskeletal:        General: Normal range of motion.     Cervical back: Normal range of motion and neck supple.     Right lower leg: No edema.     Left lower leg: No edema.  Lymphadenopathy:     Cervical: No cervical adenopathy.  Skin:    General: Skin is warm and dry.  Neurological:     General: No focal deficit present.     Mental Status: He is alert and oriented to person, place, and time. Mental status is at baseline.  Psychiatric:        Mood and Affect: Mood normal.        Behavior: Behavior normal.        Thought Content: Thought content normal.        Judgment: Judgment normal.    LABS:    Ref. Range 06/29/2021 00:00  Sodium Latest Ref Range: 137 - 147  140  Potassium Latest Ref Range: 3.4 - 5.3  4.1  Chloride Latest Ref Range:  99 - 108  104  CO2 Latest Ref Range: 13 - 22  27 (A)  Glucose Unknown 113  BUN Latest Ref Range: 4 - 21  17  Creatinine Latest Ref Range: 0.6 - 1.3  1.4 (A)  Calcium Latest Ref Range: 8.7 - 10.7  8.8  Alkaline Phosphatase Latest Ref Range: 25 - 125  61  Albumin Latest Ref Range: 3.5 - 5.0  3.9  AST Latest Ref Range: 14 - 40  28  ALT Latest Ref Range: 10 - 40  14  Bilirubin, Total Unknown 0.5  WBC Unknown 5.1  RBC Latest Ref Range: 3.87 - 5.11  3 (A)  Hemoglobin Latest Ref Range: 13.5 - 17.5  9.5 (A)  HCT Latest Ref Range: 41 - 53  29 (A)  Platelets Latest Ref Range: 150 - 399  128 (A)  NEUT# Unknown 3.26    ASSESSMENT & PLAN:  Assessment/Plan:  A 80 y.o. male with metastatic FGFR2 mutation positive cholangiocarcinoma.  He will proceed with his 11th  cycle of treatment next week. His gemcitabine treatments will remain spaced out to where he will be on a day 1,15 regimen, with each cycle being spaced out to every 4 weeks.  This is being done to give his bone marrow more time to reproduce blood cells before he scheduled for a subsequent treatment.  As he was somewhat orthostatic today, I will arrange for him to receive 1 L of fluids tomorrow.  I will also arrange for him to receive Retacrit 20,000 units as his hemoglobin is less than 10.  Clinically, he is doing okay.  I will see him back in 4 weeks before he heads into his 12th cycle of therapy.  He understands all the plans discussed today and is in agreement with them.    I, Rita Ohara, am acting as scribe for [name], MD    I have reviewed this report as typed by the medical scribe, and it is complete and accurate.  Denissa Cozart Macarthur Critchley, MD

## 2021-06-29 ENCOUNTER — Encounter: Payer: Self-pay | Admitting: Oncology

## 2021-06-29 ENCOUNTER — Other Ambulatory Visit: Payer: Self-pay | Admitting: Hematology and Oncology

## 2021-06-29 ENCOUNTER — Inpatient Hospital Stay: Payer: Medicare Other | Attending: Oncology | Admitting: Oncology

## 2021-06-29 ENCOUNTER — Inpatient Hospital Stay: Payer: Medicare Other

## 2021-06-29 ENCOUNTER — Other Ambulatory Visit: Payer: Self-pay

## 2021-06-29 VITALS — BP 109/53 | HR 74 | Temp 98.1°F | Resp 16 | Ht 74.0 in | Wt 254.6 lb

## 2021-06-29 DIAGNOSIS — C240 Malignant neoplasm of extrahepatic bile duct: Secondary | ICD-10-CM

## 2021-06-29 DIAGNOSIS — Z79899 Other long term (current) drug therapy: Secondary | ICD-10-CM | POA: Insufficient documentation

## 2021-06-29 DIAGNOSIS — C786 Secondary malignant neoplasm of retroperitoneum and peritoneum: Secondary | ICD-10-CM | POA: Insufficient documentation

## 2021-06-29 DIAGNOSIS — Z5111 Encounter for antineoplastic chemotherapy: Secondary | ICD-10-CM | POA: Insufficient documentation

## 2021-06-29 DIAGNOSIS — C220 Liver cell carcinoma: Secondary | ICD-10-CM | POA: Insufficient documentation

## 2021-06-29 DIAGNOSIS — C787 Secondary malignant neoplasm of liver and intrahepatic bile duct: Secondary | ICD-10-CM | POA: Insufficient documentation

## 2021-06-29 DIAGNOSIS — D6481 Anemia due to antineoplastic chemotherapy: Secondary | ICD-10-CM | POA: Insufficient documentation

## 2021-06-29 LAB — BASIC METABOLIC PANEL
BUN: 17 (ref 4–21)
CO2: 27 — AB (ref 13–22)
Chloride: 104 (ref 99–108)
Creatinine: 1.4 — AB (ref 0.6–1.3)
Glucose: 113
Potassium: 4.1 (ref 3.4–5.3)
Sodium: 140 (ref 137–147)

## 2021-06-29 LAB — COMPREHENSIVE METABOLIC PANEL
Albumin: 3.9 (ref 3.5–5.0)
Calcium: 8.8 (ref 8.7–10.7)

## 2021-06-29 LAB — CBC AND DIFFERENTIAL
HCT: 29 — AB (ref 41–53)
Hemoglobin: 9.5 — AB (ref 13.5–17.5)
Neutrophils Absolute: 3.26
Platelets: 128 — AB (ref 150–399)
WBC: 5.1

## 2021-06-29 LAB — HEPATIC FUNCTION PANEL
ALT: 14 (ref 10–40)
AST: 28 (ref 14–40)
Alkaline Phosphatase: 61 (ref 25–125)
Bilirubin, Total: 0.5

## 2021-06-29 LAB — CBC: RBC: 3 — AB (ref 3.87–5.11)

## 2021-06-29 MED FILL — Gemcitabine HCl Inj 1 GM/26.3ML (38 MG/ML) (Base Equiv): INTRAVENOUS | Qty: 49 | Status: AC

## 2021-06-29 MED FILL — Dexamethasone Sodium Phosphate Inj 100 MG/10ML: INTRAMUSCULAR | Qty: 1 | Status: AC

## 2021-06-30 ENCOUNTER — Inpatient Hospital Stay: Payer: Medicare Other

## 2021-06-30 ENCOUNTER — Other Ambulatory Visit: Payer: Self-pay

## 2021-06-30 VITALS — BP 154/72 | HR 77 | Temp 98.4°F | Resp 18 | Ht 74.0 in | Wt 260.2 lb

## 2021-06-30 DIAGNOSIS — Z79899 Other long term (current) drug therapy: Secondary | ICD-10-CM | POA: Diagnosis not present

## 2021-06-30 DIAGNOSIS — C787 Secondary malignant neoplasm of liver and intrahepatic bile duct: Secondary | ICD-10-CM | POA: Diagnosis not present

## 2021-06-30 DIAGNOSIS — C786 Secondary malignant neoplasm of retroperitoneum and peritoneum: Secondary | ICD-10-CM | POA: Diagnosis not present

## 2021-06-30 DIAGNOSIS — C220 Liver cell carcinoma: Secondary | ICD-10-CM | POA: Diagnosis not present

## 2021-06-30 DIAGNOSIS — C240 Malignant neoplasm of extrahepatic bile duct: Secondary | ICD-10-CM

## 2021-06-30 DIAGNOSIS — D6481 Anemia due to antineoplastic chemotherapy: Secondary | ICD-10-CM | POA: Diagnosis not present

## 2021-06-30 DIAGNOSIS — Z5111 Encounter for antineoplastic chemotherapy: Secondary | ICD-10-CM | POA: Diagnosis present

## 2021-06-30 MED ORDER — EPOETIN ALFA-EPBX 10000 UNIT/ML IJ SOLN
10000.0000 [IU] | Freq: Once | INTRAMUSCULAR | Status: AC
  Administered 2021-06-30: 10000 [IU] via SUBCUTANEOUS
  Filled 2021-06-30: qty 1

## 2021-06-30 MED ORDER — SODIUM CHLORIDE 0.9% FLUSH
10.0000 mL | INTRAVENOUS | Status: DC | PRN
  Administered 2021-06-30: 10 mL

## 2021-06-30 MED ORDER — SODIUM CHLORIDE 0.9 % IV SOLN: Freq: Once | INTRAVENOUS | Status: AC

## 2021-06-30 MED ORDER — SODIUM CHLORIDE 0.9 % IV SOLN
10.0000 mg | Freq: Once | INTRAVENOUS | Status: AC
  Administered 2021-06-30: 10 mg via INTRAVENOUS
  Filled 2021-06-30: qty 10

## 2021-06-30 MED ORDER — SODIUM CHLORIDE 0.9 % IV SOLN: Freq: Once | INTRAVENOUS | Status: DC

## 2021-06-30 MED ORDER — SODIUM CHLORIDE 0.9 % IV SOLN
750.0000 mg/m2 | Freq: Once | INTRAVENOUS | Status: AC
  Administered 2021-06-30: 1862 mg via INTRAVENOUS
  Filled 2021-06-30: qty 48.97

## 2021-06-30 NOTE — Progress Notes (Signed)
1424: PT STABLE AT TIME OF DISCHARGE  

## 2021-06-30 NOTE — Patient Instructions (Signed)
[facility]  Discharge Instructions: ?Thank you for choosing [facility] to provide your oncology and hematology care.  ?If you have a lab appointment with the Cancer Center, please go directly to the Cancer Center and check in at the registration area. ?  ?Wear comfortable clothing and clothing appropriate for easy access to any Portacath or PICC line.  ? ?We strive to give you quality time with your provider. You may need to reschedule your appointment if you arrive late (15 or more minutes).  Arriving late affects you and other patients whose appointments are after yours.  Also, if you miss three or more appointments without notifying the office, you may be dismissed from the clinic at the provider?s discretion.    ?  ?For prescription refill requests, have your pharmacy contact our office and allow 72 hours for refills to be completed.   ? ?Today you received the following chemotherapy and/or immunotherapy agents Gemcitabine    ?  ?To help prevent nausea and vomiting after your treatment, we encourage you to take your nausea medication as directed. ? ?BELOW ARE SYMPTOMS THAT SHOULD BE REPORTED IMMEDIATELY: ?*FEVER GREATER THAN 100.4 F (38 ?C) OR HIGHER ?*CHILLS OR SWEATING ?*NAUSEA AND VOMITING THAT IS NOT CONTROLLED WITH YOUR NAUSEA MEDICATION ?*UNUSUAL SHORTNESS OF BREATH ?*UNUSUAL BRUISING OR BLEEDING ?*URINARY PROBLEMS (pain or burning when urinating, or frequent urination) ?*BOWEL PROBLEMS (unusual diarrhea, constipation, pain near the anus) ?TENDERNESS IN MOUTH AND THROAT WITH OR WITHOUT PRESENCE OF ULCERS (sore throat, sores in mouth, or a toothache) ?UNUSUAL RASH, SWELLING OR PAIN  ?UNUSUAL VAGINAL DISCHARGE OR ITCHING  ? ?Items with * indicate a potential emergency and should be followed up as soon as possible or go to the Emergency Department if any problems should occur. ? ?Please show the CHEMOTHERAPY ALERT CARD or IMMUNOTHERAPY ALERT CARD at check-in to the  Emergency Department and triage nurse. ? ?Should you have questions after your visit or need to cancel or reschedule your appointment, please contact [facility]   Dept: [phone]  and follow the prompts.  Office hours are 8:00 a.m. to 4:30 p.m. Monday - Friday. Please note that voicemails left after 4:00 p.m. may not be returned until the following business day.  We are closed weekends and major holidays. You have access to a nurse at all times for urgent questions. Please call the main number to the clinic Dept: [phone] and follow the prompts. ? ?For any non-urgent questions, you may also contact your provider using MyChart. We now offer e-Visits for anyone 18 and older to request care online for non-urgent symptoms. For details visit [email or web address]. ?  ?Also download the MyChart app! Go to the app store, search "MyChart", open the app, select [facility], and log in with your MyChart username and password. ? ?Due to Covid, a mask is required upon entering the hospital/clinic. If you do not have a mask, one will be given to you upon arrival. For doctor visits, patients may have 1 support person aged 18 or older with them. For treatment visits, patients cannot have anyone with them due to current Covid guidelines and our immunocompromised population.  ? ? ?

## 2021-07-03 ENCOUNTER — Encounter: Payer: Self-pay | Admitting: Oncology

## 2021-07-13 ENCOUNTER — Other Ambulatory Visit: Payer: Self-pay | Admitting: Hematology and Oncology

## 2021-07-13 ENCOUNTER — Inpatient Hospital Stay: Payer: Medicare Other

## 2021-07-13 DIAGNOSIS — C240 Malignant neoplasm of extrahepatic bile duct: Secondary | ICD-10-CM

## 2021-07-13 LAB — BASIC METABOLIC PANEL
BUN: 16 (ref 4–21)
CO2: 26 — AB (ref 13–22)
Chloride: 106 (ref 99–108)
Creatinine: 1.5 — AB (ref 0.6–1.3)
Glucose: 119
Potassium: 4.3 (ref 3.4–5.3)
Sodium: 141 (ref 137–147)

## 2021-07-13 LAB — CBC AND DIFFERENTIAL
HCT: 28 — AB (ref 41–53)
Hemoglobin: 9 — AB (ref 13.5–17.5)
Neutrophils Absolute: 3.74
Platelets: 117 — AB (ref 150–399)
WBC: 5.5

## 2021-07-13 LAB — HEPATIC FUNCTION PANEL
ALT: 11 (ref 10–40)
AST: 24 (ref 14–40)
Alkaline Phosphatase: 61 (ref 25–125)
Bilirubin, Total: 0.6

## 2021-07-13 LAB — COMPREHENSIVE METABOLIC PANEL
Albumin: 3.9 (ref 3.5–5.0)
Calcium: 8.2 — AB (ref 8.7–10.7)

## 2021-07-13 LAB — CBC
Absolute Lymphocytes: 0.61 — AB (ref 0.65–4.75)
MCV: 96 — AB (ref 80–94)
RBC: 2.91 — AB (ref 3.87–5.11)

## 2021-07-13 MED FILL — Dexamethasone Sodium Phosphate Inj 100 MG/10ML: INTRAMUSCULAR | Qty: 1 | Status: AC

## 2021-07-13 MED FILL — Gemcitabine HCl Inj 1 GM/26.3ML (38 MG/ML) (Base Equiv): INTRAVENOUS | Qty: 49 | Status: AC

## 2021-07-14 ENCOUNTER — Other Ambulatory Visit: Payer: Self-pay

## 2021-07-14 ENCOUNTER — Inpatient Hospital Stay: Payer: Medicare Other

## 2021-07-14 VITALS — BP 145/73 | HR 65 | Temp 97.6°F | Resp 18 | Ht 74.0 in | Wt 261.8 lb

## 2021-07-14 DIAGNOSIS — C240 Malignant neoplasm of extrahepatic bile duct: Secondary | ICD-10-CM

## 2021-07-14 DIAGNOSIS — Z5111 Encounter for antineoplastic chemotherapy: Secondary | ICD-10-CM | POA: Diagnosis not present

## 2021-07-14 MED ORDER — SODIUM CHLORIDE 0.9 % IV SOLN
Freq: Once | INTRAVENOUS | Status: AC
Start: 2021-07-14 — End: 2021-07-14

## 2021-07-14 MED ORDER — SODIUM CHLORIDE 0.9 % IV SOLN
10.0000 mg | Freq: Once | INTRAVENOUS | Status: AC
  Administered 2021-07-14: 10 mg via INTRAVENOUS
  Filled 2021-07-14: qty 10

## 2021-07-14 MED ORDER — SODIUM CHLORIDE 0.9% FLUSH
10.0000 mL | INTRAVENOUS | Status: DC | PRN
  Administered 2021-07-14: 10 mL

## 2021-07-14 MED ORDER — SODIUM CHLORIDE 0.9 % IV SOLN
750.0000 mg/m2 | Freq: Once | INTRAVENOUS | Status: AC
  Administered 2021-07-14: 1862 mg via INTRAVENOUS
  Filled 2021-07-14: qty 48.97

## 2021-07-14 NOTE — Patient Instructions (Signed)
[facility]  Discharge Instructions: ?Thank you for choosing [facility] to provide your oncology and hematology care.  ?If you have a lab appointment with the Cancer Center, please go directly to the Cancer Center and check in at the registration area. ?  ?Wear comfortable clothing and clothing appropriate for easy access to any Portacath or PICC line.  ? ?We strive to give you quality time with your provider. You may need to reschedule your appointment if you arrive late (15 or more minutes).  Arriving late affects you and other patients whose appointments are after yours.  Also, if you miss three or more appointments without notifying the office, you may be dismissed from the clinic at the provider?s discretion.    ?  ?For prescription refill requests, have your pharmacy contact our office and allow 72 hours for refills to be completed.   ? ?Today you received the following chemotherapy and/or immunotherapy agents Gemcitabine    ?  ?To help prevent nausea and vomiting after your treatment, we encourage you to take your nausea medication as directed. ? ?BELOW ARE SYMPTOMS THAT SHOULD BE REPORTED IMMEDIATELY: ?*FEVER GREATER THAN 100.4 F (38 ?C) OR HIGHER ?*CHILLS OR SWEATING ?*NAUSEA AND VOMITING THAT IS NOT CONTROLLED WITH YOUR NAUSEA MEDICATION ?*UNUSUAL SHORTNESS OF BREATH ?*UNUSUAL BRUISING OR BLEEDING ?*URINARY PROBLEMS (pain or burning when urinating, or frequent urination) ?*BOWEL PROBLEMS (unusual diarrhea, constipation, pain near the anus) ?TENDERNESS IN MOUTH AND THROAT WITH OR WITHOUT PRESENCE OF ULCERS (sore throat, sores in mouth, or a toothache) ?UNUSUAL RASH, SWELLING OR PAIN  ?UNUSUAL VAGINAL DISCHARGE OR ITCHING  ? ?Items with * indicate a potential emergency and should be followed up as soon as possible or go to the Emergency Department if any problems should occur. ? ?Please show the CHEMOTHERAPY ALERT CARD or IMMUNOTHERAPY ALERT CARD at check-in to the  Emergency Department and triage nurse. ? ?Should you have questions after your visit or need to cancel or reschedule your appointment, please contact [facility]  Dept: [phone]  and follow the prompts.  Office hours are 8:00 a.m. to 4:30 p.m. Monday - Friday. Please note that voicemails left after 4:00 p.m. may not be returned until the following business day.  We are closed weekends and major holidays. You have access to a nurse at all times for urgent questions. Please call the main number to the clinic Dept: [phone] and follow the prompts. ? ?For any non-urgent questions, you may also contact your provider using MyChart. We now offer e-Visits for anyone 18 and older to request care online for non-urgent symptoms. For details visit [email or web address]. ?  ?Also download the MyChart app! Go to the app store, search "MyChart", open the app, select [facility], and log in with your MyChart username and password. ? ?Due to Covid, a mask is required upon entering the hospital/clinic. If you do not have a mask, one will be given to you upon arrival. For doctor visits, patients may have 1 support person aged 18 or older with them. For treatment visits, patients cannot have anyone with them due to current Covid guidelines and our immunocompromised population.  ? ? ?

## 2021-07-14 NOTE — Progress Notes (Signed)
1106: Reported hospitalization to Dr Bobby Rumpf recent fall. B/p high on admission but down at present. Dr. Bobby Rumpf orders to proceed with chemotherapy today and see cardiologist asap. Discussed with patient he will follow up as recommended. 1305: PT STABLE AT TIME OF DISCHARGE

## 2021-07-21 NOTE — Progress Notes (Signed)
[facility]  [address] [place],    [address] [phone]  Clinic Day: 07/27/2021  Referring physician: Bonnita Nasuti, MD  This document serves as a record of services personally performed by [name], MD. It was created on their behalf by Curry,Lauren E, a trained medical scribe. The creation of this record is based on the scribe's personal observations and the provider's statements to them.  HISTORY OF PRESENT ILLNESS:  The patient is a 80 y.o. 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  He comes in today prior to his 12th cycle of chemotherapy, which now consists of maintenance gemcitabine.  Cisplatin was discontinued after 8 cycles.  The patient claims to have tolerated his last cycle of treatment fairly well.  He denies abdominal pain, distention or other findings which concern him for overt signs of disease progression. Of note, this gentleman did have a blackout spell which led to a skull laceration.  He was treated in the emergency room for this.  A head CT did not show anything particularly ominous.  PHYSICAL EXAM:  Blood pressure (!) 156/73, pulse 83, temperature 98.4 F (36.9 C), resp. rate 18, height $RemoveBe'6\' 2"'[id number]$  (1.88 m), weight 252 lb 8 oz (114.5 kg), SpO2 93 %.  Wt Readings from Last 3 Encounters:  07/27/21 252 lb 8 oz (114.5 kg)  07/14/21 261 lb 12 oz (118.7 kg)  06/30/21 260 lb 4 oz (118 kg)   Body mass index is 32.42 kg/m. Performance status (ECOG): 1 - Symptomatic but completely ambulatory Physical Exam Constitutional:      General: He is not in acute distress.    Appearance: Normal appearance. He is normal weight.  HENT:     Head: Normocephalic and atraumatic.  Eyes:     General: No scleral icterus.    Extraocular Movements: Extraocular movements intact.     Conjunctiva/sclera: Conjunctivae  normal.     Pupils: Pupils are equal, round, and reactive to light.  Cardiovascular:     Rate and Rhythm: Normal rate and regular rhythm.     Pulses: Normal pulses.     Heart sounds: Normal heart sounds. No murmur heard.   No friction rub. No gallop.  Pulmonary:     Effort: Pulmonary effort is normal. No respiratory distress.     Breath sounds: Normal breath sounds.  Abdominal:     General: Bowel sounds are normal. There is no distension.     Palpations: Abdomen is soft. There is no hepatomegaly, splenomegaly or mass.     Tenderness: There is no abdominal tenderness.  Musculoskeletal:        General: Normal range of motion.     Cervical back: Normal range of motion and neck supple.     Right lower leg: No edema.     Left lower leg: No edema.  Lymphadenopathy:     Cervical: No cervical adenopathy.  Skin:    General: Skin is warm and dry.  Neurological:     General: No focal deficit present.     Mental Status: He is alert and oriented to person, place, and time. Mental status is at baseline.  Psychiatric:        Mood and Affect: Mood normal.        Behavior: Behavior normal.        Thought Content: Thought content normal.        Judgment:  Judgment normal.    LABS:    Ref. Range 07/26/2021 00:00  Sodium Latest Ref Range: 137 - 147  139  Potassium Latest Ref Range: 3.4 - 5.3  4.4  Chloride Latest Ref Range: 99 - 108  107  CO2 Latest Ref Range: 13 - 22  26 (A)  Glucose Unknown 110  BUN Latest Ref Range: 4 - 21  30 (A)  Creatinine Latest Ref Range: 0.6 - 1.3  1.5 (A)  Calcium Latest Ref Range: 8.7 - 10.7  8.9  Alkaline Phosphatase Latest Ref Range: 25 - 125  55  Albumin Latest Ref Range: 3.5 - 5.0  4.0  AST Latest Ref Range: 14 - 40  31  ALT Latest Ref Range: 10 - 40  15  Bilirubin, Total Unknown 0.7  WBC Unknown 5.9  RBC Latest Ref Range: 3.87 - 5.11  2.94 (A)  Hemoglobin Latest Ref Range: 13.5 - 17.5  9.4 (A)  HCT Latest Ref Range: 41 - 53  28 (A)  Platelets Latest Ref  Range: 150 - 399  119 (A)  NEUT# Unknown 4.31    ASSESSMENT & PLAN:  Assessment/Plan:  A 80 y.o. male with metastatic FGFR2 mutation positive cholangiocarcinoma.  He will proceed with his 12th cycle of treatment tomorrow.  His gemcitabine treatments will remain spaced out to where he will be on a day 1,15 regimen, with each cycle being spaced out to every 4 weeks.  This is being done to give his bone marrow more time to reproduce blood cells before he scheduled for a subsequent treatment.  As his hemoglobin remains below 10, he will continue to receive Retacrit every 2 weeks with each gemcitabine treatment.  Clinically, he is doing okay.  I will see him back in 4 weeks before he heads into his 13th cycle of therapy.  A repeat abdominal MRI will be done before his next visit to ascertain his disease baseline after 12 cycles of treatment.  The patient understands all the plans discussed today and is in agreement with them.     I, Rita Ohara, am acting as scribe for [name], MD    I have reviewed this report as typed by the medical scribe, and it is complete and accurate.  Dequincy Macarthur Critchley, MD

## 2021-07-24 ENCOUNTER — Encounter: Payer: Self-pay | Admitting: Oncology

## 2021-07-26 ENCOUNTER — Encounter: Payer: Self-pay | Admitting: Hematology and Oncology

## 2021-07-26 ENCOUNTER — Inpatient Hospital Stay: Payer: Medicare Other | Attending: Oncology

## 2021-07-26 ENCOUNTER — Encounter: Payer: Self-pay | Admitting: Oncology

## 2021-07-26 DIAGNOSIS — Z79899 Other long term (current) drug therapy: Secondary | ICD-10-CM | POA: Insufficient documentation

## 2021-07-26 DIAGNOSIS — C221 Intrahepatic bile duct carcinoma: Secondary | ICD-10-CM | POA: Insufficient documentation

## 2021-07-26 DIAGNOSIS — D6481 Anemia due to antineoplastic chemotherapy: Secondary | ICD-10-CM | POA: Insufficient documentation

## 2021-07-26 DIAGNOSIS — C240 Malignant neoplasm of extrahepatic bile duct: Secondary | ICD-10-CM

## 2021-07-26 DIAGNOSIS — Z5111 Encounter for antineoplastic chemotherapy: Secondary | ICD-10-CM | POA: Insufficient documentation

## 2021-07-26 DIAGNOSIS — C786 Secondary malignant neoplasm of retroperitoneum and peritoneum: Secondary | ICD-10-CM | POA: Insufficient documentation

## 2021-07-26 LAB — COMPREHENSIVE METABOLIC PANEL
Albumin: 4 (ref 3.5–5.0)
Calcium: 8.9 (ref 8.7–10.7)

## 2021-07-26 LAB — BASIC METABOLIC PANEL
BUN: 30 — AB (ref 4–21)
CO2: 26 — AB (ref 13–22)
Chloride: 107 (ref 99–108)
Creatinine: 1.5 — AB (ref 0.6–1.3)
Glucose: 110
Potassium: 4.4 (ref 3.4–5.3)
Sodium: 139 (ref 137–147)

## 2021-07-26 LAB — CBC AND DIFFERENTIAL
HCT: 28 — AB (ref 41–53)
Hemoglobin: 9.4 — AB (ref 13.5–17.5)
Neutrophils Absolute: 4.31
Platelets: 119 — AB (ref 150–399)
WBC: 5.9

## 2021-07-26 LAB — HEPATIC FUNCTION PANEL
ALT: 15 (ref 10–40)
AST: 31 (ref 14–40)
Alkaline Phosphatase: 55 (ref 25–125)
Bilirubin, Total: 0.7

## 2021-07-26 LAB — CBC: RBC: 2.94 — AB (ref 3.87–5.11)

## 2021-07-27 ENCOUNTER — Other Ambulatory Visit: Payer: Self-pay | Admitting: Oncology

## 2021-07-27 ENCOUNTER — Other Ambulatory Visit: Payer: Medicare Other

## 2021-07-27 ENCOUNTER — Inpatient Hospital Stay (INDEPENDENT_AMBULATORY_CARE_PROVIDER_SITE_OTHER): Payer: Medicare Other | Admitting: Oncology

## 2021-07-27 ENCOUNTER — Telehealth: Payer: Self-pay | Admitting: Oncology

## 2021-07-27 VITALS — BP 156/73 | HR 83 | Temp 98.4°F | Resp 18 | Ht 74.0 in | Wt 252.5 lb

## 2021-07-27 DIAGNOSIS — T451X5A Adverse effect of antineoplastic and immunosuppressive drugs, initial encounter: Secondary | ICD-10-CM | POA: Diagnosis not present

## 2021-07-27 DIAGNOSIS — C240 Malignant neoplasm of extrahepatic bile duct: Secondary | ICD-10-CM

## 2021-07-27 DIAGNOSIS — D6481 Anemia due to antineoplastic chemotherapy: Secondary | ICD-10-CM

## 2021-07-27 MED FILL — Gemcitabine HCl Inj 1 GM/26.3ML (38 MG/ML) (Base Equiv): INTRAVENOUS | Qty: 49 | Status: AC

## 2021-07-27 MED FILL — Dexamethasone Sodium Phosphate Inj 100 MG/10ML: INTRAMUSCULAR | Qty: 1 | Status: AC

## 2021-07-27 NOTE — Telephone Encounter (Signed)
Per 11/3 LOS, patient scheduled for 12/1 Appt - Scheduled 11/4 Inf/Inj - 11/17 Labs, 11/18 Inf/Inj - Gave patient Appt Calendar  Will schedule MRI after receiving [facility]

## 2021-07-27 NOTE — Telephone Encounter (Signed)
Patient notified of scheduled 11/30 MRI Abdomen [facility] at 10:15 am.  Informed patient I would give him the paperwork tomorrow at his next Visit

## 2021-07-28 ENCOUNTER — Inpatient Hospital Stay: Payer: Medicare Other

## 2021-07-28 ENCOUNTER — Other Ambulatory Visit: Payer: Self-pay

## 2021-07-28 ENCOUNTER — Encounter: Payer: Self-pay | Admitting: Oncology

## 2021-07-28 ENCOUNTER — Other Ambulatory Visit: Payer: Self-pay | Admitting: Hematology and Oncology

## 2021-07-28 VITALS — BP 94/52 | HR 72 | Temp 97.9°F | Resp 18 | Ht 74.0 in | Wt 252.2 lb

## 2021-07-28 DIAGNOSIS — D6481 Anemia due to antineoplastic chemotherapy: Secondary | ICD-10-CM

## 2021-07-28 DIAGNOSIS — Z79899 Other long term (current) drug therapy: Secondary | ICD-10-CM | POA: Diagnosis not present

## 2021-07-28 DIAGNOSIS — Z5111 Encounter for antineoplastic chemotherapy: Secondary | ICD-10-CM | POA: Diagnosis not present

## 2021-07-28 DIAGNOSIS — C240 Malignant neoplasm of extrahepatic bile duct: Secondary | ICD-10-CM

## 2021-07-28 DIAGNOSIS — T451X5A Adverse effect of antineoplastic and immunosuppressive drugs, initial encounter: Secondary | ICD-10-CM

## 2021-07-28 DIAGNOSIS — C221 Intrahepatic bile duct carcinoma: Secondary | ICD-10-CM | POA: Diagnosis present

## 2021-07-28 DIAGNOSIS — C786 Secondary malignant neoplasm of retroperitoneum and peritoneum: Secondary | ICD-10-CM | POA: Diagnosis not present

## 2021-07-28 MED ORDER — EPOETIN ALFA-EPBX 10000 UNIT/ML IJ SOLN
10000.0000 [IU] | Freq: Once | INTRAMUSCULAR | Status: AC
  Administered 2021-07-28: 10000 [IU] via SUBCUTANEOUS
  Filled 2021-07-28: qty 1

## 2021-07-28 MED ORDER — SODIUM CHLORIDE 0.9 % IV SOLN
750.0000 mg/m2 | Freq: Once | INTRAVENOUS | Status: AC
  Administered 2021-07-28: 1862 mg via INTRAVENOUS
  Filled 2021-07-28: qty 48.97

## 2021-07-28 MED ORDER — SODIUM CHLORIDE 0.9 % IV SOLN: Freq: Once | INTRAVENOUS | Status: AC

## 2021-07-28 MED ORDER — SODIUM CHLORIDE 0.9% FLUSH
10.0000 mL | INTRAVENOUS | Status: DC | PRN
  Administered 2021-07-28: 10 mL

## 2021-07-28 MED ORDER — SODIUM CHLORIDE 0.9 % IV SOLN
10.0000 mg | Freq: Once | INTRAVENOUS | Status: AC
  Administered 2021-07-28: 10 mg via INTRAVENOUS
  Filled 2021-07-28: qty 10

## 2021-07-28 NOTE — Patient Instructions (Signed)
[name]  Discharge Instructions: Thank you for choosing [facility] to provide your oncology and hematology care.  If you have a lab appointment with the [facility], please go directly to the [facility] and check in at the registration area.   Wear comfortable clothing and clothing appropriate for easy access to any Portacath or PICC line.   We strive to give you quality time with your provider. You may need to reschedule your appointment if you arrive late (15 or more minutes).  Arriving late affects you and other patients whose appointments are after yours.  Also, if you miss three or more appointments without notifying the office, you may be dismissed from the clinic at the provider's discretion.      For prescription refill requests, have your pharmacy contact our office and allow 72 hours for refills to be completed.    Today you received the following chemotherapy and/or immunotherapy agents Gemcitabine.Epoetin Alfa injection What is this medication? EPOETIN ALFA (e POE e tin AL fa) helps your body make more red blood cells. This medicine is used to treat anemia caused by chronic kidney disease, cancer chemotherapy, or HIV-therapy. It may also be used before surgery if you have anemia. This medicine may be used for other purposes; ask your health care provider or pharmacist if you have questions. COMMON BRAND NAME(S): Epogen, Procrit, Retacrit What should I tell my care team before I take this medication? They need to know if you have any of these conditions: cancer heart disease high blood pressure history of blood clots history of stroke low levels of folate, iron, or vitamin B12 in the blood seizures an unusual or allergic reaction to erythropoietin, albumin, benzyl alcohol, hamster proteins, other medicines, foods, dyes, or preservatives pregnant or trying to get pregnant breast-feeding How should I use this medication? This  medicine is for injection into a vein or under the skin. It is usually given by a health care professional in a hospital or clinic setting. If you get this medicine at home, you will be taught how to prepare and give this medicine. Use exactly as directed. Take your medicine at regular intervals. Do not take your medicine more often than directed. It is important that you put your used needles and syringes in a special sharps container. Do not put them in a trash can. If you do not have a sharps container, call your pharmacist or healthcare provider to get one. A special MedGuide will be given to you by the pharmacist with each prescription and refill. Be sure to read this information carefully each time. Talk to your pediatrician regarding the use of this medicine in children. While this drug may be prescribed for selected conditions, precautions do apply. Overdosage: If you think you have taken too much of this medicine contact a poison control center or emergency room at once. NOTE: This medicine is only for you. Do not share this medicine with others. What if I miss a dose? If you miss a dose, take it as soon as you can. If it is almost time for your next dose, take only that dose. Do not take double or extra doses. What may interact with this medication? Interactions have not been studied. This list may not describe all possible interactions. Give your health care provider a list of all the medicines, herbs, non-prescription drugs, or dietary supplements you use. Also tell them if you smoke, drink alcohol, or use illegal drugs. Some  items may interact with your medicine. What should I watch for while using this medication? Your condition will be monitored carefully while you are receiving this medicine. You may need blood work done while you are taking this medicine. This medicine may cause a decrease in vitamin B6. You should make sure that you get enough vitamin B6 while you are taking this  medicine. Discuss the foods you eat and the vitamins you take with your health care professional. What side effects may I notice from receiving this medication? Side effects that you should report to your doctor or health care professional as soon as possible: allergic reactions like skin rash, itching or hives, swelling of the face, lips, or tongue seizures signs and symptoms of a blood clot such as breathing problems; changes in vision; chest pain; severe, sudden headache; pain, swelling, warmth in the leg; trouble speaking; sudden numbness or weakness of the face, arm or leg signs and symptoms of a stroke like changes in vision; confusion; trouble speaking or understanding; severe headaches; sudden numbness or weakness of the face, arm or leg; trouble walking; dizziness; loss of balance or coordination Side effects that usually do not require medical attention (report to your doctor or health care professional if they continue or are bothersome): chills cough dizziness fever headaches joint pain muscle cramps muscle pain nausea, vomiting pain, redness, or irritation at site where injected This list may not describe all possible side effects. Call your doctor for medical advice about side effects. You may report side effects to FDA at 1-800-FDA-1088. Where should I keep my medication? Keep out of the reach of children. Store in a refrigerator between 2 and 8 degrees C (36 and 46 degrees F). Do not freeze or shake. Throw away any unused portion if using a single-dose vial. Multi-dose vials can be kept in the refrigerator for up to 21 days after the initial dose. Throw away unused medicine. NOTE: This sheet is a summary. It may not cover all possible information. If you have questions about this medicine, talk to your doctor, pharmacist, or health care provider.  2022 Elsevier/Gold Standard (2017-05-14 00:00:00)       To help prevent nausea and vomiting after your treatment, we encourage  you to take your nausea medication as directed.  BELOW ARE SYMPTOMS THAT SHOULD BE REPORTED IMMEDIATELY: *FEVER GREATER THAN 100.4 F (38 C) OR HIGHER *CHILLS OR SWEATING *NAUSEA AND VOMITING THAT IS NOT CONTROLLED WITH YOUR NAUSEA MEDICATION *UNUSUAL SHORTNESS OF BREATH *UNUSUAL BRUISING OR BLEEDING *URINARY PROBLEMS (pain or burning when urinating, or frequent urination) *BOWEL PROBLEMS (unusual diarrhea, constipation, pain near the anus) TENDERNESS IN MOUTH AND THROAT WITH OR WITHOUT PRESENCE OF ULCERS (sore throat, sores in mouth, or a toothache) UNUSUAL RASH, SWELLING OR PAIN  UNUSUAL VAGINAL DISCHARGE OR ITCHING   Items with * indicate a potential emergency and should be followed up as soon as possible or go to the Emergency Department if any problems should occur.  Please show the CHEMOTHERAPY ALERT CARD or IMMUNOTHERAPY ALERT CARD at check-in to the Emergency Department and triage nurse.  Should you have questions after your visit or need to cancel or reschedule your appointment, please contact [facility]  Dept: [phone]  and follow the prompts.  Office hours are 8:00 a.m. to 4:30 p.m. Monday - Friday. Please note that voicemails left after 4:00 p.m. may not be returned until the following business day.  We are closed weekends and major holidays. You have  access to a nurse at all times for urgent questions. Please call the main number to the clinic Dept: [phone] and follow the prompts.  For any non-urgent questions, you may also contact your provider using MyChart. We now offer e-Visits for anyone 49 and older to request care online for non-urgent symptoms. For details visit [email or web address].   Also download the MyChart app! Go to the app store, search "MyChart", open the app, select [facility], and log in with your MyChart username and password.  Due to Covid, a mask is required upon entering the hospital/clinic. If you do not have a mask,  one will be given to you upon arrival. For doctor visits, patients may have 1 support person aged 30 or older with them. For treatment visits, patients cannot have anyone with them due to current Covid guidelines and our immunocompromised population.   Dehydration, Adult Dehydration is condition in which there is not enough water or other fluids in the body. This happens when a person loses more fluids than he or she takes in. Important body parts cannot work right without the right amount of fluids. Any loss of fluids from the body can cause dehydration. Dehydration can be mild, worse, or very bad. It should be treated right away to keep it from getting very bad. What are the causes? This condition may be caused by: Conditions that cause loss of water or other fluids, such as: Watery poop (diarrhea). Vomiting. Sweating a lot. Peeing (urinating) a lot. Not drinking enough fluids, especially when you: Are ill. Are doing things that take a lot of energy to do. Other illnesses and conditions, such as fever or infection. Certain medicines, such as medicines that take extra fluid out of the body (diuretics). Lack of safe drinking water. Not being able to get enough water and food. What increases the risk? The following factors may make you more likely to develop this condition: Having a long-term (chronic) illness that has not been treated the right way, such as: Diabetes. Heart disease. Kidney disease. Being 23 years of age or older. Having a disability. Living in a place that is high above the ground or sea (high in altitude). The thinner, dried air causes more fluid loss. Doing exercises that put stress on your body for a long time. What are the signs or symptoms? Symptoms of dehydration depend on how bad it is. Mild or worse dehydration Thirst. Dry lips or dry mouth. Feeling dizzy or light-headed, especially when you stand up from sitting. Muscle cramps. Your body making: Dark pee  (urine). Pee may be the color of tea. Less pee than normal. Less tears than normal. Headache. Very bad dehydration Changes in skin. Skin may: Be cold to the touch (clammy). Be blotchy or pale. Not go back to normal right after you lightly pinch it and let it go. Little or no tears, pee, or sweat. Changes in vital signs, such as: Fast breathing. Low blood pressure. Weak pulse. Pulse that is more than 100 beats a minute when you are sitting still. Other changes, such as: Feeling very thirsty. Eyes that look hollow (sunken). Cold hands and feet. Being mixed up (confused). Being very tired (lethargic) or having trouble waking from sleep. Short-term weight loss. Loss of consciousness. How is this treated? Treatment for this condition depends on how bad it is. Treatment should start right away. Do not wait until your condition gets very bad. Very bad dehydration is an emergency. You will need to go to a hospital.  Mild or worse dehydration can be treated at home. You may be asked to: Drink more fluids. Drink an oral rehydration solution (ORS). This drink helps get the right amounts of fluids and salts and minerals in the blood (electrolytes). Very bad dehydration can be treated: With fluids through an IV tube. By getting normal levels of salts and minerals in your blood. This is often done by giving salts and minerals through a tube. The tube is passed through your nose and into your stomach. By treating the root cause. Follow these instructions at home: Oral rehydration solution If told by your doctor, drink an ORS: Make an ORS. Use instructions on the package. Start by drinking small amounts, about  cup (120 mL) every 5-10 minutes. Slowly drink more until you have had the amount that your doctor said to have. Eating and drinking     Drink enough clear fluid to keep your pee pale yellow. If you were told to drink an ORS, finish the ORS first. Then, start slowly drinking other  clear fluids. Drink fluids such as: Water. Do not drink only water. Doing that can make the salt (sodium) level in your body get too low. Water from ice chips you suck on. Fruit juice that you have added water to (diluted). Low-calorie sports drinks. Eat foods that have the right amounts of salts and minerals, such as: Bananas. Oranges. Potatoes. Tomatoes. Spinach. Do not drink alcohol. Avoid: Drinks that have a lot of sugar. These include: High-calorie sports drinks. Fruit juice that you did not add water to. Soda. Caffeine. Foods that are greasy or have a lot of fat or sugar. General instructions Take over-the-counter and prescription medicines only as told by your doctor. Do not take salt tablets. Doing that can make the salt level in your body get too high. Return to your normal activities as told by your doctor. Ask your doctor what activities are safe for you. Keep all follow-up visits as told by your doctor. This is important. Contact a doctor if: You have pain in your belly (abdomen) and the pain: Gets worse. Stays in one place. You have a rash. You have a stiff neck. You get angry or annoyed (irritable) more easily than normal. You are more tired or have a harder time waking than normal. You feel: Weak or dizzy. Very thirsty. Get help right away if you have: Any symptoms of very bad dehydration. Symptoms of vomiting, such as: You cannot eat or drink without vomiting. Your vomiting gets worse or does not go away. Your vomit has blood or green stuff in it. Symptoms that get worse with treatment. A fever. A very bad headache. Problems with peeing or pooping (having a bowel movement), such as: Watery poop that gets worse or does not go away. Blood in your poop (stool). This may cause poop to look black and tarry. Not peeing in 6-8 hours. Peeing only a small amount of very dark pee in 6-8 hours. Trouble breathing. These symptoms may be an emergency. Do not wait  to see if the symptoms will go away. Get medical help right away. Call your local emergency services (911 in the [place] Do not drive yourself to the hospital. Summary Dehydration is a condition in which there is not enough water or other fluids in the body. This happens when a person loses more fluids than he or she takes in. Treatment for this condition depends on how bad it is. Treatment should be started right away. Do not wait  until your condition gets very bad. Drink enough clear fluid to keep your pee pale yellow. If you were told to drink an oral rehydration solution (ORS), finish the ORS first. Then, start slowly drinking other clear fluids. Take over-the-counter and prescription medicines only as told by your doctor. Get help right away if you have any symptoms of very bad dehydration. This information is not intended to replace advice given to you by your health care provider. Make sure you discuss any questions you have with your health care provider. Document Revised: 04/23/2019 Document Reviewed: 04/23/2019 Elsevier Patient Education  [employer].

## 2021-07-28 NOTE — Patient Instructions (Signed)
Epoetin Alfa injection °What is this medication? °EPOETIN ALFA (e POE e tin AL fa) helps your body make more red blood cells. This medicine is used to treat anemia caused by chronic kidney disease, cancer chemotherapy, or HIV-therapy. It may also be used before surgery if you have anemia. °This medicine may be used for other purposes; ask your health care provider or pharmacist if you have questions. °COMMON BRAND NAME(S): Epogen, Procrit, Retacrit °What should I tell my care team before I take this medication? °They need to know if you have any of these conditions: °cancer °heart disease °high blood pressure °history of blood clots °history of stroke °low levels of folate, iron, or vitamin B12 in the blood °seizures °an unusual or allergic reaction to erythropoietin, albumin, benzyl alcohol, hamster proteins, other medicines, foods, dyes, or preservatives °pregnant or trying to get pregnant °breast-feeding °How should I use this medication? °This medicine is for injection into a vein or under the skin. It is usually given by a health care professional in a hospital or clinic setting. °If you get this medicine at home, you will be taught how to prepare and give this medicine. Use exactly as directed. Take your medicine at regular intervals. Do not take your medicine more often than directed. °It is important that you put your used needles and syringes in a special sharps container. Do not put them in a trash can. If you do not have a sharps container, call your pharmacist or healthcare provider to get one. °A special MedGuide will be given to you by the pharmacist with each prescription and refill. Be sure to read this information carefully each time. °Talk to your pediatrician regarding the use of this medicine in children. While this drug may be prescribed for selected conditions, precautions do apply. °Overdosage: If you think you have taken too much of this medicine contact a poison control center or emergency  room at once. °NOTE: This medicine is only for you. Do not share this medicine with others. °What if I miss a dose? °If you miss a dose, take it as soon as you can. If it is almost time for your next dose, take only that dose. Do not take double or extra doses. °What may interact with this medication? °Interactions have not been studied. °This list may not describe all possible interactions. Give your health care provider a list of all the medicines, herbs, non-prescription drugs, or dietary supplements you use. Also tell them if you smoke, drink alcohol, or use illegal drugs. Some items may interact with your medicine. °What should I watch for while using this medication? °Your condition will be monitored carefully while you are receiving this medicine. °You may need blood work done while you are taking this medicine. °This medicine may cause a decrease in vitamin B6. You should make sure that you get enough vitamin B6 while you are taking this medicine. Discuss the foods you eat and the vitamins you take with your health care professional. °What side effects may I notice from receiving this medication? °Side effects that you should report to your doctor or health care professional as soon as possible: °allergic reactions like skin rash, itching or hives, swelling of the face, lips, or tongue °seizures °signs and symptoms of a blood clot such as breathing problems; changes in vision; chest pain; severe, sudden headache; pain, swelling, warmth in the leg; trouble speaking; sudden numbness or weakness of the face, arm or leg °signs and symptoms of a stroke like   changes in vision; confusion; trouble speaking or understanding; severe headaches; sudden numbness or weakness of the face, arm or leg; trouble walking; dizziness; loss of balance or coordination °Side effects that usually do not require medical attention (report to your doctor or health care professional if they continue or are  bothersome): °chills °cough °dizziness °fever °headaches °joint pain °muscle cramps °muscle pain °nausea, vomiting °pain, redness, or irritation at site where injected °This list may not describe all possible side effects. Call your doctor for medical advice about side effects. You may report side effects to FDA at 1-800-FDA-1088. °Where should I keep my medication? °Keep out of the reach of children. °Store in a refrigerator between 2 and 8 degrees C (36 and 46 degrees F). Do not freeze or shake. Throw away any unused portion if using a single-dose vial. Multi-dose vials can be kept in the refrigerator for up to 21 days after the initial dose. Throw away unused medicine. °NOTE: This sheet is a summary. It may not cover all possible information. If you have questions about this medicine, talk to your doctor, pharmacist, or health care provider. °© 2022 Elsevier/Gold Standard (2017-05-14 00:00:00) ° °

## 2021-08-10 ENCOUNTER — Other Ambulatory Visit: Payer: Self-pay

## 2021-08-10 ENCOUNTER — Other Ambulatory Visit: Payer: Self-pay | Admitting: Hematology and Oncology

## 2021-08-10 ENCOUNTER — Inpatient Hospital Stay: Payer: Medicare Other

## 2021-08-10 DIAGNOSIS — C240 Malignant neoplasm of extrahepatic bile duct: Secondary | ICD-10-CM

## 2021-08-10 DIAGNOSIS — Z5111 Encounter for antineoplastic chemotherapy: Secondary | ICD-10-CM | POA: Diagnosis not present

## 2021-08-10 DIAGNOSIS — R6889 Other general symptoms and signs: Secondary | ICD-10-CM

## 2021-08-10 DIAGNOSIS — D649 Anemia, unspecified: Secondary | ICD-10-CM

## 2021-08-10 LAB — HEPATIC FUNCTION PANEL
ALT: 14 (ref 10–40)
AST: 25 (ref 14–40)
Alkaline Phosphatase: 63 (ref 25–125)
Bilirubin, Total: 0.9

## 2021-08-10 LAB — IRON AND TIBC
Iron: 46 ug/dL (ref 45–182)
Saturation Ratios: 15 % — ABNORMAL LOW (ref 17.9–39.5)
TIBC: 307 ug/dL (ref 250–450)
UIBC: 261 ug/dL

## 2021-08-10 LAB — BASIC METABOLIC PANEL
BUN: 20 (ref 4–21)
CO2: 28 — AB (ref 13–22)
Chloride: 106 (ref 99–108)
Creatinine: 1.5 — AB (ref 0.6–1.3)
Glucose: 98
Potassium: 4.6 (ref 3.4–5.3)
Sodium: 141 (ref 137–147)

## 2021-08-10 LAB — CBC AND DIFFERENTIAL
HCT: 30 — AB (ref 41–53)
Hemoglobin: 9.7 — AB (ref 13.5–17.5)
Neutrophils Absolute: 4.15
Platelets: 114 — AB (ref 150–399)
WBC: 6.1

## 2021-08-10 LAB — VITAMIN B12: Vitamin B-12: 311 pg/mL (ref 180–914)

## 2021-08-10 LAB — FERRITIN: Ferritin: 220 ng/mL (ref 24–336)

## 2021-08-10 LAB — FOLATE: Folate: 4.5 ng/mL — ABNORMAL LOW (ref >5.9)

## 2021-08-10 LAB — COMPREHENSIVE METABOLIC PANEL
Albumin: 3.8 (ref 3.5–5.0)
Calcium: 8.9 (ref 8.7–10.7)

## 2021-08-10 LAB — CBC
MCV: 97 — AB (ref 80–94)
RBC: 3.06 — AB (ref 3.87–5.11)

## 2021-08-11 ENCOUNTER — Inpatient Hospital Stay: Payer: Medicare Other

## 2021-08-11 ENCOUNTER — Other Ambulatory Visit: Payer: Self-pay | Admitting: Hematology and Oncology

## 2021-08-11 VITALS — BP 117/66 | HR 79 | Temp 97.9°F | Resp 18 | Ht 74.0 in | Wt 250.8 lb

## 2021-08-11 DIAGNOSIS — D649 Anemia, unspecified: Secondary | ICD-10-CM

## 2021-08-11 DIAGNOSIS — D6481 Anemia due to antineoplastic chemotherapy: Secondary | ICD-10-CM

## 2021-08-11 DIAGNOSIS — R6889 Other general symptoms and signs: Secondary | ICD-10-CM

## 2021-08-11 DIAGNOSIS — Z5111 Encounter for antineoplastic chemotherapy: Secondary | ICD-10-CM | POA: Diagnosis not present

## 2021-08-11 DIAGNOSIS — C240 Malignant neoplasm of extrahepatic bile duct: Secondary | ICD-10-CM

## 2021-08-11 MED ORDER — SODIUM CHLORIDE 0.9 % IV SOLN
10.0000 mg | Freq: Once | INTRAVENOUS | Status: AC
  Administered 2021-08-11: 10 mg via INTRAVENOUS
  Filled 2021-08-11: qty 10

## 2021-08-11 MED ORDER — EPOETIN ALFA-EPBX 10000 UNIT/ML IJ SOLN
10000.0000 [IU] | Freq: Once | INTRAMUSCULAR | Status: AC
  Administered 2021-08-11: 10000 [IU] via SUBCUTANEOUS
  Filled 2021-08-11: qty 1

## 2021-08-11 MED ORDER — SODIUM CHLORIDE 0.9% FLUSH
10.0000 mL | INTRAVENOUS | Status: DC | PRN
  Administered 2021-08-11: 10 mL

## 2021-08-11 MED ORDER — FOLIC ACID 1 MG PO TABS: 1.0000 mg | ORAL_TABLET | Freq: Every day | ORAL | 3 refills | Status: AC

## 2021-08-11 MED ORDER — SODIUM CHLORIDE 0.9 % IV SOLN: Freq: Once | INTRAVENOUS | Status: AC

## 2021-08-11 MED ORDER — SODIUM CHLORIDE 0.9 % IV SOLN
750.0000 mg/m2 | Freq: Once | INTRAVENOUS | Status: AC
  Administered 2021-08-11: 1862 mg via INTRAVENOUS
  Filled 2021-08-11: qty 48.97

## 2021-08-11 NOTE — Patient Instructions (Addendum)
[facility]  Discharge Instructions: Thank you for choosing [facility] to provide your oncology and hematology care.  If you have a lab appointment with the [facility], please go directly to the [facility] and check in at the registration area.   Wear comfortable clothing and clothing appropriate for easy access to any Portacath or PICC line.   We strive to give you quality time with your provider. You may need to reschedule your appointment if you arrive late (15 or more minutes).  Arriving late affects you and other patients whose appointments are after yours.  Also, if you miss three or more appointments without notifying the office, you may be dismissed from the clinic at the provider's discretion.      For prescription refill requests, have your pharmacy contact our office and allow 72 hours for refills to be completed.    Today you received the following chemotherapy and/or immunotherapy agents Gemcitabine     To help prevent nausea and vomiting after your treatment, we encourage you to take your nausea medication as directed.  BELOW ARE SYMPTOMS THAT SHOULD BE REPORTED IMMEDIATELY: *FEVER GREATER THAN 100.4 F (38 C) OR HIGHER *CHILLS OR SWEATING *NAUSEA AND VOMITING THAT IS NOT CONTROLLED WITH YOUR NAUSEA MEDICATION *UNUSUAL SHORTNESS OF BREATH *UNUSUAL BRUISING OR BLEEDING *URINARY PROBLEMS (pain or burning when urinating, or frequent urination) *BOWEL PROBLEMS (unusual diarrhea, constipation, pain near the anus) TENDERNESS IN MOUTH AND THROAT WITH OR WITHOUT PRESENCE OF ULCERS (sore throat, sores in mouth, or a toothache) UNUSUAL RASH, SWELLING OR PAIN  UNUSUAL VAGINAL DISCHARGE OR ITCHING   Items with * indicate a potential emergency and should be followed up as soon as possible or go to the Emergency Department if any problems should occur.  Please show the CHEMOTHERAPY ALERT CARD or IMMUNOTHERAPY ALERT CARD at check-in to the  Emergency Department and triage nurse.  Should you have questions after your visit or need to cancel or reschedule your appointment, please contact [facility]  Dept: [phone]  and follow the prompts.  Office hours are 8:00 a.m. to 4:30 p.m. Monday - Friday. Please note that voicemails left after 4:00 p.m. may not be returned until the following business day.  We are closed weekends and major holidays. You have access to a nurse at all times for urgent questions. Please call the main number to the clinic Dept: [phone] and follow the prompts.  For any non-urgent questions, you may also contact your provider using MyChart. We now offer e-Visits for anyone 50 and older to request care online for non-urgent symptoms. For details visit [email or web address].   Also download the MyChart app! Go to the app store, search "MyChart", open the app, select [facility], and log in with your MyChart username and password.  Due to Covid, a mask is required upon entering the hospital/clinic. If you do not have a mask, one will be given to you upon arrival. For doctor visits, patients may have 1 support person aged 32 or older with them. For treatment visits, patients cannot have anyone with them due to current Covid guidelines and our immunocompromised population.   Folic Acid Injection What is this medication? FOLIC ACID (FOE lik AS id) prevents and treats low levels of folate (vitamin B9) in your body. Folate plays an important role in forming red blood cells and maintaining brain health. Folate also supports a healthy pregnancy and lowers the risk of birth defects  of the brain and spine. This medicine may be used for other purposes; ask your health care provider or pharmacist if you have questions. What should I tell my care team before I take this medication? They need to know if you have any of these conditions: Kidney disease Pernicious anemia Vitamin B12 deficient  anemia An unusual or allergic reaction to folic acid, other B vitamins, benzyl alcohol, other medications, foods, dyes, or preservatives Pregnant or trying to get pregnant Breast-feeding How should I use this medication? This medication is for injection into a vein, into a muscle, or under the skin. It is usually given in a hospital or clinic setting. If you get this medication at home, you will be taught how to prepare and give this medication. Use exactly as directed. Take your medication at regular intervals. Do not take your medication more often than directed. It is important that you put your used needles and syringes in a special sharps container. Do not put them in a trash can. If you do not have a sharps container, call your pharmacist or care team to get one. Talk to your care team about the use of this medication in children. Special care may be needed. Overdosage: If you think you have taken too much of this medicine contact a poison control center or emergency room at once. NOTE: This medicine is only for you. Do not share this medicine with others. What if I miss a dose? If you miss a dose, take it as soon as you can. If it is almost time for your next dose, take only that dose. Do not take double or extra doses. What may interact with this medication? Chloramphenicol Cholestyramine Medications for seizures Methotrexate Nitrofurantoin Pyrimethamine This list may not describe all possible interactions. Give your health care provider a list of all the medicines, herbs, non-prescription drugs, or dietary supplements you use. Also tell them if you smoke, drink alcohol, or use illegal drugs. Some items may interact with your medicine. What should I watch for while using this medication? Visit your care team for regular check-ups. Your care team may order blood tests. You need to eat a proper diet even while you are taking this vitamin. Taking vitamin supplements is not a substitute  for a healthy diet. Ask your care team for good nutrition advice. What side effects may I notice from receiving this medication? Side effects that you should report to your care team as soon as possible: Allergic reactions--skin rash, itching, hives, swelling of the face, lips, tongue, or throat Breathing problems Pain, tingling, or numbness in the hands or feet Side effects that usually do not require medical attention (report to your care team if they continue or are bothersome): General discomfort and fatigue Loss of appetite Nausea This list may not describe all possible side effects. Call your doctor for medical advice about side effects. You may report side effects to FDA at 1-800-FDA-1088. Where should I keep my medication? Keep out of the reach of children. If you are using this medication at home, you will be instructed on how to store this medication. Throw away any unused medication after the expiration date on the label. NOTE: This sheet is a summary. It may not cover all possible information. If you have questions about this medicine, talk to your doctor, pharmacist, or health care provider.  2022 Elsevier/Gold Standard (2020-11-24 00:00:00)

## 2021-08-11 NOTE — Progress Notes (Signed)
1130: PT STABLE AT TIME OF DISCHARGE

## 2021-08-15 NOTE — Progress Notes (Signed)
Gary [name]  [address] [place],  [place]  [address] [phone]  Clinic Day: 08/24/2021  Referring physician: Bonnita Nasuti, MD  This document serves as a record of services personally performed by Gary Potter, MD. It was created on their behalf by Curry,Lauren E, a trained medical scribe. The creation of this record is based on the scribe's personal observations and the provider's statements to them.  HISTORY OF PRESENT ILLNESS:  The patient is a 80 y.o. 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  He comes in today to go over his CT scans to ascertain his new disease baseline after receiving 12 cycles of chemotherapy, which now consists of maintenance gemcitabine.  Cisplatin was discontinued after 8 cycles.  The patient claims to have tolerated his last cycle of treatment fairly well.  He still has occasional syncopal episodes, which his other physicians have yet to elucidate their etiology.  As it pertains to his cholangiocarcinoma, he denies having any abdominal pain, distention or other findings which concern him for overt signs of disease progression.   PHYSICAL EXAM:  Blood pressure 139/74, pulse 74, temperature 97.9 F (36.6 C), resp. rate 16, height $RemoveBe'6\' 2"'oWafyJPvH$  (1.88 m), weight 248 lb 1.6 oz (112.5 kg), SpO2 91 %.  Wt Readings from Last 3 Encounters:  08/24/21 248 lb 1.6 oz (112.5 kg)  08/11/21 250 lb 12 oz (113.7 kg)  07/28/21 252 lb 4 oz (114.4 kg)   Body mass index is 31.85 kg/m. Performance status (ECOG): 1 - Symptomatic but completely ambulatory Physical Exam Constitutional:      General: He is not in acute distress.    Appearance: Normal appearance. He is normal weight.  HENT:     Head: Normocephalic and atraumatic.  Eyes:     General: No scleral icterus.    Extraocular Movements: Extraocular movements intact.      Conjunctiva/sclera: Conjunctivae normal.     Pupils: Pupils are equal, round, and reactive to light.  Cardiovascular:     Rate and Rhythm: Normal rate and regular rhythm.     Pulses: Normal pulses.     Heart sounds: Normal heart sounds. No murmur heard.   No friction rub. No gallop.  Pulmonary:     Effort: Pulmonary effort is normal. No respiratory distress.     Breath sounds: Normal breath sounds.  Abdominal:     General: Bowel sounds are normal. There is no distension.     Palpations: Abdomen is soft. There is no hepatomegaly, splenomegaly or mass.     Tenderness: There is no abdominal tenderness.  Musculoskeletal:        General: Normal range of motion.     Cervical back: Normal range of motion and neck supple.     Right lower leg: No edema.     Left lower leg: No edema.  Lymphadenopathy:     Cervical: No cervical adenopathy.  Skin:    General: Skin is warm and dry.  Neurological:     General: No focal deficit present.     Mental Status: He is alert and oriented to person, place, and time. Mental status is at baseline.  Psychiatric:        Mood and Affect: Mood normal.        Behavior: Behavior normal.        Thought Content: Thought content normal.  Judgment: Judgment normal.   SCANS:  His abdominal MRI imaging has revealed the following:  FINDINGS: Lower chest: Mild cardiomegaly. Atelectasis along the right hemidiaphragm. Hepatobiliary: Partial left hepatectomy observed. There is a roughly similar appearance of the metastatic disease in the liver, with clustered nodules restricting diffusion with accentuated T2 signal inferiorly along segment 4 of the liver as shown on image 15 series 8 and image 15 series 4, with some associated arterial phase enhancement in this region for example on image 73 of series 1101. One of the index nodules posteriorly on image 15 of series 4 that is part of the clustered segment 4 nodularity measures 1.7 by 1.2 cm, formerly the  same. There is also a small T2 hyperintense lesion posteriorly in the right hepatic lobe measuring 0.7 cm in diameter which restricts diffusion on image 11 of series 4, with mild arterial phase enhancement, compatible with metastatic lesion. This is unchanged from previous. Likewise a small metastatic focus along the dome of segment 7 on image 6 of series 8 appears similar to prior. No new liver metastatic lesions identified. Pancreas: Unremarkable Spleen: Unremarkable Adrenals/Urinary Tract: Small right kidney upper pole cyst. Small left kidney lower pole cyst. No worrisome renal lesions. Adrenal glands unremarkable. Stomach/Bowel: Unremarkable Vascular/Lymphatic: No pathologic adenopathy identified. Atherosclerosis is present, including aortoiliac atherosclerotic disease. Other: Bilobed enhancing omental nodule measuring up to 1.3 by 0.8 cm on image 80 of series 1102 (stable). Musculoskeletal: Unremarkable  IMPRESSION: 1. Stable enhancing foci of presumed metastatic disease in the liver, with clustered nodules in segment 4 of the liver and two small nodules in the right hepatic lobe unchanged from previous. There is also a chronic stable bilobed small omental nodule which is stable. No new or enlarging tumor identified. 2. Stable atelectasis at the right lung base. 3. Mild cardiomegaly.  LABS:    Latest Reference Range & Units 08/24/21 00:00  Sodium 137 - 147  139 (E)  Potassium 3.4 - 5.3  4.2 (E)  Chloride 99 - 108  107 (E)  CO2 13 - 22  23 ! (E)  Glucose  117 (E)  BUN 4 - 21  30 ! (E)  Creatinine 0.6 - 1.3  1.5 ! (E)  Calcium 8.7 - 10.7  8.3 ! (E)  Alkaline Phosphatase 25 - 125  59 (E)  Albumin 3.5 - 5.0  3.8 (E)  AST 14 - 40  28 (E)  ALT 10 - 40  16 (E)  Bilirubin, Total  0.7 (E)  WBC  6.0 (E)  RBC 3.87 - 5.11  3.09 ! (E)  Hemoglobin 13.5 - 17.5  9.6 ! (E)  HCT 41 - 53  29 ! (E)  Platelets 150 - 399  170 (E)  NEUT#  4.02 (E)    ASSESSMENT & PLAN:   Assessment/Plan:  A 80 y.o. male with metastatic FGFR2 mutation positive cholangiocarcinoma. In clinic today, I went over his abdominal MRI images with him, which show that his disease remains stable.  There remain no new signs of disease progression.  Based upon this, he will proceed with his 13th cycle of treatment tomorrow.  His gemcitabine treatments will remain spaced out to where they will be on a day 1,15 regimen, with each cycle being spaced out to every 4 weeks.  As his hemoglobin remains below 10, he will continue to receive Retacrit every 2 weeks with each gemcitabine treatment.  Clinically, he is doing fine.  I will see him back in 4 weeks before he  heads into his 14th cycle of therapy.  The patient understands all the plans discussed today and is in agreement with them.     I, Rita Ohara, am acting as scribe for Gary Potter, MD    I have reviewed this report as typed by the medical scribe, and it is complete and accurate.  Hilda Rynders Macarthur Critchley, MD

## 2021-08-16 ENCOUNTER — Encounter: Payer: Self-pay | Admitting: Oncology

## 2021-08-23 ENCOUNTER — Encounter: Payer: Self-pay | Admitting: Oncology

## 2021-08-24 ENCOUNTER — Inpatient Hospital Stay: Payer: Medicare Other | Attending: Oncology

## 2021-08-24 ENCOUNTER — Inpatient Hospital Stay (INDEPENDENT_AMBULATORY_CARE_PROVIDER_SITE_OTHER): Payer: Medicare Other | Admitting: Oncology

## 2021-08-24 ENCOUNTER — Other Ambulatory Visit: Payer: Self-pay | Admitting: Hematology and Oncology

## 2021-08-24 VITALS — BP 139/74 | HR 74 | Temp 97.9°F | Resp 16 | Ht 74.0 in | Wt 248.1 lb

## 2021-08-24 DIAGNOSIS — Z5111 Encounter for antineoplastic chemotherapy: Secondary | ICD-10-CM | POA: Insufficient documentation

## 2021-08-24 DIAGNOSIS — C221 Intrahepatic bile duct carcinoma: Secondary | ICD-10-CM | POA: Insufficient documentation

## 2021-08-24 DIAGNOSIS — D6481 Anemia due to antineoplastic chemotherapy: Secondary | ICD-10-CM | POA: Insufficient documentation

## 2021-08-24 DIAGNOSIS — I517 Cardiomegaly: Secondary | ICD-10-CM | POA: Insufficient documentation

## 2021-08-24 DIAGNOSIS — C786 Secondary malignant neoplasm of retroperitoneum and peritoneum: Secondary | ICD-10-CM | POA: Diagnosis not present

## 2021-08-24 DIAGNOSIS — J9811 Atelectasis: Secondary | ICD-10-CM | POA: Diagnosis not present

## 2021-08-24 DIAGNOSIS — C240 Malignant neoplasm of extrahepatic bile duct: Secondary | ICD-10-CM | POA: Diagnosis not present

## 2021-08-24 DIAGNOSIS — R6889 Other general symptoms and signs: Secondary | ICD-10-CM

## 2021-08-24 LAB — CBC: RBC: 3.09 — AB (ref 3.87–5.11)

## 2021-08-24 LAB — CBC AND DIFFERENTIAL
HCT: 29 — AB (ref 41–53)
Hemoglobin: 9.6 — AB (ref 13.5–17.5)
Neutrophils Absolute: 4.02
Platelets: 170 (ref 150–399)
WBC: 6

## 2021-08-24 LAB — VITAMIN B12: Vitamin B-12: 342 pg/mL (ref 180–914)

## 2021-08-24 LAB — HEPATIC FUNCTION PANEL
ALT: 16 (ref 10–40)
AST: 28 (ref 14–40)
Alkaline Phosphatase: 59 (ref 25–125)
Bilirubin, Total: 0.7

## 2021-08-24 LAB — BASIC METABOLIC PANEL WITH GFR
BUN: 30 — AB (ref 4–21)
CO2: 23 — AB (ref 13–22)
Chloride: 107 (ref 99–108)
Creatinine: 1.5 — AB (ref 0.6–1.3)
Glucose: 117
Potassium: 4.2 (ref 3.4–5.3)
Sodium: 139 (ref 137–147)

## 2021-08-24 LAB — COMPREHENSIVE METABOLIC PANEL WITH GFR
Albumin: 3.8 (ref 3.5–5.0)
Calcium: 8.3 — AB (ref 8.7–10.7)

## 2021-08-24 LAB — FOLATE: Folate: 22 ng/mL (ref >5.9)

## 2021-08-27 ENCOUNTER — Encounter: Payer: Self-pay | Admitting: Oncology

## 2021-08-31 MED FILL — Dexamethasone Sodium Phosphate Inj 100 MG/10ML: INTRAMUSCULAR | Qty: 1 | Status: AC

## 2021-08-31 MED FILL — Gemcitabine HCl Inj 1 GM/26.3ML (38 MG/ML) (Base Equiv): INTRAVENOUS | Qty: 49 | Status: AC

## 2021-09-01 ENCOUNTER — Encounter: Payer: Self-pay | Admitting: Oncology

## 2021-09-01 ENCOUNTER — Other Ambulatory Visit: Payer: Self-pay

## 2021-09-01 ENCOUNTER — Inpatient Hospital Stay: Payer: Medicare Other

## 2021-09-01 VITALS — BP 119/62 | HR 68 | Temp 98.0°F | Resp 18 | Ht 74.0 in | Wt 250.0 lb

## 2021-09-01 DIAGNOSIS — D6481 Anemia due to antineoplastic chemotherapy: Secondary | ICD-10-CM

## 2021-09-01 DIAGNOSIS — C240 Malignant neoplasm of extrahepatic bile duct: Secondary | ICD-10-CM

## 2021-09-01 DIAGNOSIS — Z5111 Encounter for antineoplastic chemotherapy: Secondary | ICD-10-CM | POA: Diagnosis not present

## 2021-09-01 MED ORDER — SODIUM CHLORIDE 0.9 % IV SOLN
750.0000 mg/m2 | Freq: Once | INTRAVENOUS | Status: AC
  Administered 2021-09-01: 1862 mg via INTRAVENOUS
  Filled 2021-09-01: qty 48.97

## 2021-09-01 MED ORDER — SODIUM CHLORIDE 0.9 % IV SOLN
10.0000 mg | Freq: Once | INTRAVENOUS | Status: AC
  Administered 2021-09-01: 10 mg via INTRAVENOUS
  Filled 2021-09-01: qty 10

## 2021-09-01 MED ORDER — SODIUM CHLORIDE 0.9 % IV SOLN: Freq: Once | INTRAVENOUS | Status: AC

## 2021-09-01 MED ORDER — EPOETIN ALFA-EPBX 10000 UNIT/ML IJ SOLN
10000.0000 [IU] | Freq: Once | INTRAMUSCULAR | Status: AC
  Administered 2021-09-01: 10000 [IU] via SUBCUTANEOUS
  Filled 2021-09-01: qty 1

## 2021-09-01 NOTE — Patient Instructions (Signed)
Gary [name]  Discharge Instructions: Thank you for choosing [facility] to provide your oncology and hematology care.  If you have a lab appointment with the [facility], please go directly to the [facility] and check in at the registration area.   Wear comfortable clothing and clothing appropriate for easy access to any Portacath or PICC line.   We strive to give you quality time with your provider. You may need to reschedule your appointment if you arrive late (15 or more minutes).  Arriving late affects you and other patients whose appointments are after yours.  Also, if you miss three or more appointments without notifying the office, you may be dismissed from the clinic at the provider's discretion.      For prescription refill requests, have your pharmacy contact our office and allow 72 hours for refills to be completed.    Today you received the following chemotherapy and/or immunotherapy agents gemzar   To help prevent nausea and vomiting after your treatment, we encourage you to take your nausea medication as directed.  BELOW ARE SYMPTOMS THAT SHOULD BE REPORTED IMMEDIATELY: *FEVER GREATER THAN 100.4 F (38 C) OR HIGHER *CHILLS OR SWEATING *NAUSEA AND VOMITING THAT IS NOT CONTROLLED WITH YOUR NAUSEA MEDICATION *UNUSUAL SHORTNESS OF BREATH *UNUSUAL BRUISING OR BLEEDING *URINARY PROBLEMS (pain or burning when urinating, or frequent urination) *BOWEL PROBLEMS (unusual diarrhea, constipation, pain near the anus) TENDERNESS IN MOUTH AND THROAT WITH OR WITHOUT PRESENCE OF ULCERS (sore throat, sores in mouth, or a toothache) UNUSUAL RASH, SWELLING OR PAIN  UNUSUAL VAGINAL DISCHARGE OR ITCHING   Items with * indicate a potential emergency and should be followed up as soon as possible or go to the Emergency Department if any problems should occur.  Please show the CHEMOTHERAPY ALERT CARD or IMMUNOTHERAPY ALERT CARD at check-in to the Emergency  Department and triage nurse.  Should you have questions after your visit or need to cancel or reschedule your appointment, please contact [facility]  Dept: [phone]  and follow the prompts.  Office hours are 8:00 a.m. to 4:30 p.m. Monday - Friday. Please note that voicemails left after 4:00 p.m. may not be returned until the following business day.  We are closed weekends and major holidays. You have access to a nurse at all times for urgent questions. Please call the main number to the clinic Dept: [phone] and follow the prompts.  For any non-urgent questions, you may also contact your provider using MyChart. We now offer e-Visits for anyone 93 and older to request care online for non-urgent symptoms. For details visit [email or web address].   Also download the MyChart app! Go to the app store, search "MyChart", open the app, select [facility], and log in with your MyChart username and password.  Due to Covid, a mask is required upon entering the hospital/clinic. If you do not have a mask, one will be given to you upon arrival. For doctor visits, patients may have 1 support person aged 61 or older with them. For treatment visits, patients cannot have anyone with them due to current Covid guidelines and our immunocompromised population.   Epoetin Alfa injection What is this medication? EPOETIN ALFA (e POE e tin AL fa) helps your body make more red blood cells. This medicine is used to treat anemia caused by chronic kidney disease, cancer chemotherapy, or HIV-therapy. It may also be used before surgery if you have anemia. This medicine may be  used for other purposes; ask your health care provider or pharmacist if you have questions. COMMON BRAND NAME(S): Epogen, Procrit, Retacrit What should I tell my care team before I take this medication? They need to know if you have any of these conditions: cancer heart disease high blood pressure history of blood  clots history of stroke low levels of folate, iron, or vitamin B12 in the blood seizures an unusual or allergic reaction to erythropoietin, albumin, benzyl alcohol, hamster proteins, other medicines, foods, dyes, or preservatives pregnant or trying to get pregnant breast-feeding How should I use this medication? This medicine is for injection into a vein or under the skin. It is usually given by a health care professional in a hospital or clinic setting. If you get this medicine at home, you will be taught how to prepare and give this medicine. Use exactly as directed. Take your medicine at regular intervals. Do not take your medicine more often than directed. It is important that you put your used needles and syringes in a special sharps container. Do not put them in a trash can. If you do not have a sharps container, call your pharmacist or healthcare provider to get one. A special MedGuide will be given to you by the pharmacist with each prescription and refill. Be sure to read this information carefully each time. Talk to your pediatrician regarding the use of this medicine in children. While this drug may be prescribed for selected conditions, precautions do apply. Overdosage: If you think you have taken too much of this medicine contact a poison control center or emergency room at once. NOTE: This medicine is only for you. Do not share this medicine with others. What if I miss a dose? If you miss a dose, take it as soon as you can. If it is almost time for your next dose, take only that dose. Do not take double or extra doses. What may interact with this medication? Interactions have not been studied. This list may not describe all possible interactions. Give your health care provider a list of all the medicines, herbs, non-prescription drugs, or dietary supplements you use. Also tell them if you smoke, drink alcohol, or use illegal drugs. Some items may interact with your medicine. What  should I watch for while using this medication? Your condition will be monitored carefully while you are receiving this medicine. You may need blood work done while you are taking this medicine. This medicine may cause a decrease in vitamin B6. You should make sure that you get enough vitamin B6 while you are taking this medicine. Discuss the foods you eat and the vitamins you take with your health care professional. What side effects may I notice from receiving this medication? Side effects that you should report to your doctor or health care professional as soon as possible: allergic reactions like skin rash, itching or hives, swelling of the face, lips, or tongue seizures signs and symptoms of a blood clot such as breathing problems; changes in vision; chest pain; severe, sudden headache; pain, swelling, warmth in the leg; trouble speaking; sudden numbness or weakness of the face, arm or leg signs and symptoms of a stroke like changes in vision; confusion; trouble speaking or understanding; severe headaches; sudden numbness or weakness of the face, arm or leg; trouble walking; dizziness; loss of balance or coordination Side effects that usually do not require medical attention (report to your doctor or health care professional if they continue or are bothersome): chills cough  dizziness fever headaches joint pain muscle cramps muscle pain nausea, vomiting pain, redness, or irritation at site where injected This list may not describe all possible side effects. Call your doctor for medical advice about side effects. You may report side effects to FDA at 1-800-FDA-1088. Where should I keep my medication? Keep out of the reach of children. Store in a refrigerator between 2 and 8 degrees C (36 and 46 degrees F). Do not freeze or shake. Throw away any unused portion if using a single-dose vial. Multi-dose vials can be kept in the refrigerator for up to 21 days after the initial dose. Throw away  unused medicine. NOTE: This sheet is a summary. It may not cover all possible information. If you have questions about this medicine, talk to your doctor, pharmacist, or health care provider.  2022 Elsevier/Gold Standard (2017-05-14 00:00:00) Gemcitabine injection What is this medication? GEMCITABINE (jem SYE ta been) is a chemotherapy drug. This medicine is used to treat many types of cancer like breast cancer, lung cancer, pancreatic cancer, and ovarian cancer. This medicine may be used for other purposes; ask your health care provider or pharmacist if you have questions. COMMON BRAND NAME(S): Gemzar, Infugem What should I tell my care team before I take this medication? They need to know if you have any of these conditions: blood disorders infection kidney disease liver disease lung or breathing disease, like asthma recent or ongoing radiation therapy an unusual or allergic reaction to gemcitabine, other chemotherapy, other medicines, foods, dyes, or preservatives pregnant or trying to get pregnant breast-feeding How should I use this medication? This drug is given as an infusion into a vein. It is administered in a hospital or clinic by a specially trained health care professional. Talk to your pediatrician regarding the use of this medicine in children. Special care may be needed. Overdosage: If you think you have taken too much of this medicine contact a poison control center or emergency room at once. NOTE: This medicine is only for you. Do not share this medicine with others. What if I miss a dose? It is important not to miss your dose. Call your doctor or health care professional if you are unable to keep an appointment. What may interact with this medication? medicines to increase blood counts like filgrastim, pegfilgrastim, sargramostim some other chemotherapy drugs like cisplatin vaccines Talk to your doctor or health care professional before taking any of these  medicines: acetaminophen aspirin ibuprofen ketoprofen naproxen This list may not describe all possible interactions. Give your health care provider a list of all the medicines, herbs, non-prescription drugs, or dietary supplements you use. Also tell them if you smoke, drink alcohol, or use illegal drugs. Some items may interact with your medicine. What should I watch for while using this medication? Visit your doctor for checks on your progress. This drug may make you feel generally unwell. This is not uncommon, as chemotherapy can affect healthy cells as well as cancer cells. Report any side effects. Continue your course of treatment even though you feel ill unless your doctor tells you to stop. In some cases, you may be given additional medicines to help with side effects. Follow all directions for their use. Call your doctor or health care professional for advice if you get a fever, chills or sore throat, or other symptoms of a cold or flu. Do not treat yourself. This drug decreases your body's ability to fight infections. Try to avoid being around people who are sick. This medicine  may increase your risk to bruise or bleed. Call your doctor or health care professional if you notice any unusual bleeding. Be careful brushing and flossing your teeth or using a toothpick because you may get an infection or bleed more easily. If you have any dental work done, tell your dentist you are receiving this medicine. Avoid taking products that contain aspirin, acetaminophen, ibuprofen, naproxen, or ketoprofen unless instructed by your doctor. These medicines may hide a fever. Do not become pregnant while taking this medicine or for 6 months after stopping it. Women should inform their doctor if they wish to become pregnant or think they might be pregnant. Men should not father a child while taking this medicine and for 3 months after stopping it. There is a potential for serious side effects to an unborn child.  Talk to your health care professional or pharmacist for more information. Do not breast-feed an infant while taking this medicine or for at least 1 week after stopping it. Men should inform their doctors if they wish to father a child. This medicine may lower sperm counts. Talk with your doctor or health care professional if you are concerned about your fertility. What side effects may I notice from receiving this medication? Side effects that you should report to your doctor or health care professional as soon as possible: allergic reactions like skin rash, itching or hives, swelling of the face, lips, or tongue breathing problems pain, redness, or irritation at site where injected signs and symptoms of a dangerous change in heartbeat or heart rhythm like chest pain; dizziness; fast or irregular heartbeat; palpitations; feeling faint or lightheaded, falls; breathing problems signs of decreased platelets or bleeding - bruising, pinpoint red spots on the skin, black, tarry stools, blood in the urine signs of decreased red blood cells - unusually weak or tired, feeling faint or lightheaded, falls signs of infection - fever or chills, cough, sore throat, pain or difficulty passing urine signs and symptoms of kidney injury like trouble passing urine or change in the amount of urine signs and symptoms of liver injury like dark yellow or brown urine; general ill feeling or flu-like symptoms; light-colored stools; loss of appetite; nausea; right upper belly pain; unusually weak or tired; yellowing of the eyes or skin swelling of ankles, feet, hands Side effects that usually do not require medical attention (report to your doctor or health care professional if they continue or are bothersome): constipation diarrhea hair loss loss of appetite nausea rash vomiting This list may not describe all possible side effects. Call your doctor for medical advice about side effects. You may report side effects to  FDA at 1-800-FDA-1088. Where should I keep my medication? This drug is given in a hospital or clinic and will not be stored at home. NOTE: This sheet is a summary. It may not cover all possible information. If you have questions about this medicine, talk to your doctor, pharmacist, or health care provider.  2022 Elsevier/Gold Standard (2017-12-04 00:00:00)

## 2021-09-05 ENCOUNTER — Encounter: Payer: Self-pay | Admitting: Oncology

## 2021-09-06 ENCOUNTER — Telehealth: Payer: Self-pay | Admitting: Oncology

## 2021-09-06 NOTE — Telephone Encounter (Signed)
Per 12/1 LOS, patient scheduled for Dec/Jan Appt's.  Patient Given Appt Summary

## 2021-09-14 ENCOUNTER — Telehealth: Payer: Self-pay | Admitting: Hematology and Oncology

## 2021-09-14 ENCOUNTER — Encounter: Payer: Self-pay | Admitting: Hematology and Oncology

## 2021-09-14 ENCOUNTER — Inpatient Hospital Stay: Payer: Medicare Other

## 2021-09-14 DIAGNOSIS — C240 Malignant neoplasm of extrahepatic bile duct: Secondary | ICD-10-CM

## 2021-09-14 LAB — COMPREHENSIVE METABOLIC PANEL
Albumin: 3.7 (ref 3.5–5.0)
Calcium: 8.8 (ref 8.7–10.7)

## 2021-09-14 LAB — HEPATIC FUNCTION PANEL
ALT: 18 (ref 10–40)
AST: 27 (ref 14–40)
Alkaline Phosphatase: 52 (ref 25–125)
Bilirubin, Total: 0.5

## 2021-09-14 LAB — CBC AND DIFFERENTIAL
HCT: 30 — AB (ref 41–53)
Hemoglobin: 9.7 — AB (ref 13.5–17.5)
Neutrophils Absolute: 5.88
Platelets: 126 — AB (ref 150–399)
WBC: 8.4

## 2021-09-14 LAB — BASIC METABOLIC PANEL
BUN: 19 (ref 4–21)
CO2: 27 — AB (ref 13–22)
Chloride: 106 (ref 99–108)
Creatinine: 1.2 (ref 0.6–1.3)
Glucose: 109
Potassium: 4.3 (ref 3.4–5.3)
Sodium: 142 (ref 137–147)

## 2021-09-14 LAB — CBC: RBC: 3.2 — AB (ref 3.87–5.11)

## 2021-09-14 NOTE — Telephone Encounter (Signed)
Patient came by after Lab Work to verify his Next Appt's

## 2021-09-15 ENCOUNTER — Inpatient Hospital Stay: Payer: Medicare Other

## 2021-09-15 ENCOUNTER — Other Ambulatory Visit: Payer: Self-pay

## 2021-09-15 VITALS — BP 151/74 | HR 77 | Temp 98.1°F | Resp 18 | Ht 74.0 in | Wt 252.0 lb

## 2021-09-15 DIAGNOSIS — T451X5A Adverse effect of antineoplastic and immunosuppressive drugs, initial encounter: Secondary | ICD-10-CM

## 2021-09-15 DIAGNOSIS — C240 Malignant neoplasm of extrahepatic bile duct: Secondary | ICD-10-CM

## 2021-09-15 DIAGNOSIS — Z5111 Encounter for antineoplastic chemotherapy: Secondary | ICD-10-CM | POA: Diagnosis not present

## 2021-09-15 MED ORDER — EPOETIN ALFA-EPBX 10000 UNIT/ML IJ SOLN
10000.0000 [IU] | Freq: Once | INTRAMUSCULAR | Status: AC
  Administered 2021-09-15: 10000 [IU] via SUBCUTANEOUS
  Filled 2021-09-15: qty 1

## 2021-09-15 MED ORDER — SODIUM CHLORIDE 0.9 % IV SOLN: Freq: Once | INTRAVENOUS | Status: AC

## 2021-09-15 MED ORDER — SODIUM CHLORIDE 0.9 % IV SOLN
10.0000 mg | Freq: Once | INTRAVENOUS | Status: AC
  Administered 2021-09-15: 10 mg via INTRAVENOUS
  Filled 2021-09-15: qty 10

## 2021-09-15 MED ORDER — SODIUM CHLORIDE 0.9% FLUSH
10.0000 mL | INTRAVENOUS | Status: DC | PRN
  Administered 2021-09-15: 10 mL

## 2021-09-15 MED ORDER — SODIUM CHLORIDE 0.9 % IV SOLN
750.0000 mg/m2 | Freq: Once | INTRAVENOUS | Status: AC
  Administered 2021-09-15: 1862 mg via INTRAVENOUS
  Filled 2021-09-15: qty 48.97

## 2021-09-15 NOTE — Patient Instructions (Signed)
Gemcitabine injection ?What is this medication? ?GEMCITABINE (jem SYE ta been) is a chemotherapy drug. This medicine is used to treat many types of cancer like breast cancer, lung cancer, pancreatic cancer, and ovarian cancer. ?This medicine may be used for other purposes; ask your health care provider or pharmacist if you have questions. ?COMMON BRAND NAME(S): Gemzar, Infugem ?What should I tell my care team before I take this medication? ?They need to know if you have any of these conditions: ?blood disorders ?infection ?kidney disease ?liver disease ?lung or breathing disease, like asthma ?recent or ongoing radiation therapy ?an unusual or allergic reaction to gemcitabine, other chemotherapy, other medicines, foods, dyes, or preservatives ?pregnant or trying to get pregnant ?breast-feeding ?How should I use this medication? ?This drug is given as an infusion into a vein. It is administered in a hospital or clinic by a specially trained health care professional. ?Talk to your pediatrician regarding the use of this medicine in children. Special care may be needed. ?Overdosage: If you think you have taken too much of this medicine contact a poison control center or emergency room at once. ?NOTE: This medicine is only for you. Do not share this medicine with others. ?What if I miss a dose? ?It is important not to miss your dose. Call your doctor or health care professional if you are unable to keep an appointment. ?What may interact with this medication? ?medicines to increase blood counts like filgrastim, pegfilgrastim, sargramostim ?some other chemotherapy drugs like cisplatin ?vaccines ?Talk to your doctor or health care professional before taking any of these medicines: ?acetaminophen ?aspirin ?ibuprofen ?ketoprofen ?naproxen ?This list may not describe all possible interactions. Give your health care provider a list of all the medicines, herbs, non-prescription drugs, or dietary supplements you use. Also tell  them if you smoke, drink alcohol, or use illegal drugs. Some items may interact with your medicine. ?What should I watch for while using this medication? ?Visit your doctor for checks on your progress. This drug may make you feel generally unwell. This is not uncommon, as chemotherapy can affect healthy cells as well as cancer cells. Report any side effects. Continue your course of treatment even though you feel ill unless your doctor tells you to stop. ?In some cases, you may be given additional medicines to help with side effects. Follow all directions for their use. ?Call your doctor or health care professional for advice if you get a fever, chills or sore throat, or other symptoms of a cold or flu. Do not treat yourself. This drug decreases your body's ability to fight infections. Try to avoid being around people who are sick. ?This medicine may increase your risk to bruise or bleed. Call your doctor or health care professional if you notice any unusual bleeding. ?Be careful brushing and flossing your teeth or using a toothpick because you may get an infection or bleed more easily. If you have any dental work done, tell your dentist you are receiving this medicine. ?Avoid taking products that contain aspirin, acetaminophen, ibuprofen, naproxen, or ketoprofen unless instructed by your doctor. These medicines may hide a fever. ?Do not become pregnant while taking this medicine or for 6 months after stopping it. Women should inform their doctor if they wish to become pregnant or think they might be pregnant. Men should not father a child while taking this medicine and for 3 months after stopping it. There is a potential for serious side effects to an unborn child. Talk to your health care professional   or pharmacist for more information. Do not breast-feed an infant while taking this medicine or for at least 1 week after stopping it. ?Men should inform their doctors if they wish to father a child. This medicine may  lower sperm counts. Talk with your doctor or health care professional if you are concerned about your fertility. ?What side effects may I notice from receiving this medication? ?Side effects that you should report to your doctor or health care professional as soon as possible: ?allergic reactions like skin rash, itching or hives, swelling of the face, lips, or tongue ?breathing problems ?pain, redness, or irritation at site where injected ?signs and symptoms of a dangerous change in heartbeat or heart rhythm like chest pain; dizziness; fast or irregular heartbeat; palpitations; feeling faint or lightheaded, falls; breathing problems ?signs of decreased platelets or bleeding - bruising, pinpoint red spots on the skin, black, tarry stools, blood in the urine ?signs of decreased red blood cells - unusually weak or tired, feeling faint or lightheaded, falls ?signs of infection - fever or chills, cough, sore throat, pain or difficulty passing urine ?signs and symptoms of kidney injury like trouble passing urine or change in the amount of urine ?signs and symptoms of liver injury like dark yellow or brown urine; general ill feeling or flu-like symptoms; light-colored stools; loss of appetite; nausea; right upper belly pain; unusually weak or tired; yellowing of the eyes or skin ?swelling of ankles, feet, hands ?Side effects that usually do not require medical attention (report to your doctor or health care professional if they continue or are bothersome): ?constipation ?diarrhea ?hair loss ?loss of appetite ?nausea ?rash ?vomiting ?This list may not describe all possible side effects. Call your doctor for medical advice about side effects. You may report side effects to FDA at 1-800-FDA-1088. ?Where should I keep my medication? ?This drug is given in a hospital or clinic and will not be stored at home. ?NOTE: This sheet is a summary. It may not cover all possible information. If you have questions about this medicine,  talk to your doctor, pharmacist, or health care provider. ?? 2022 Elsevier/Gold Standard (2017-12-04 00:00:00) ? ?

## 2021-09-19 ENCOUNTER — Encounter: Payer: Self-pay | Admitting: Oncology

## 2021-09-20 NOTE — Progress Notes (Cosign Needed)
Patient Care Team: Bonnita Nasuti, MD as PCP - General (Internal Medicine) Park Liter, MD as PCP - Cardiology (Cardiology)  Clinic Day:  09/21/2021  Referring physician: Bonnita Nasuti, MD  ASSESSMENT & PLAN:   Assessment & Plan: Malignant tumor of extrahepatic bile duct [facility]) A 80 y.o. male with metastatic FGFR2 mutation positive cholangiocarcinoma. Recent abdominal MRI images show that his disease remains stable.  There remain no new signs of disease progression.He will proceed with his 14th cycle of treatment next week.  His gemcitabine treatments will remain spaced out to where they will be on a day 1,15 regimen, with each cycle being spaced out to every 4 weeks.  As his hemoglobin remains below 10, he will continue to receive Retacrit every 2 weeks with each gemcitabine treatment.  Clinically, he is doing fine. He will return to clinic in 4 weeks before he heads into his 15th cycle of therapy.      The patient understands the plans discussed today and is in agreement with them.  He knows to contact our office if he develops concerns prior to his next appointment.    Melodye Ped, NP  [facility] [address] [place] [address] Dept: [phone] Dept Fax: [phone]   Orders Placed This Encounter  Procedures   CBC and differential    This external order was created through the Results Console.   CBC    This external order was created through the Results Console.   Basic metabolic panel    This external order was created through the Results Console.   Comprehensive metabolic panel    This external order was created through the Results Console.   Hepatic function panel    This external order was created through the Results Console.      CHIEF COMPLAINT:  CC: A 80 year old male with history of malignant tumor of extrahepatic bile duct  Current Treatment:  Gemcitabine  INTERVAL  HISTORY:  Gary [name] is here today for repeat clinical assessment. He denies fevers or chills. He denies pain. His appetite is good. His weight has been stable.  I have reviewed the past medical history, past surgical history, social history and family history with the patient and they are unchanged from previous note.  ALLERGIES:  is allergic to heparin, pork-derived products, and amoxicillin.  MEDICATIONS:  Current Outpatient Medications  Medication Sig Dispense Refill   ALPRAZolam (XANAX) 0.5 MG tablet Take 0.5 mg by mouth 3 (three) times daily as needed for anxiety.      aspirin EC 81 MG tablet Take 1 tablet (81 mg total) by mouth daily. Swallow whole. 90 tablet 0   atorvastatin (LIPITOR) 20 MG tablet TAKE 1 TABLET (20 MG TOTAL) BY MOUTH DAILY. (Patient not taking: Reported on 05/11/2021) 90 tablet 1   buPROPion (WELLBUTRIN XL) 300 MG 24 hr tablet Take 1 tablet by mouth daily. For 90 days     calcium carbonate (TUMS - DOSED IN MG ELEMENTAL CALCIUM) 500 MG chewable tablet Chew 1 tablet by mouth in the morning and at bedtime. Total of 1200 mg daily (Patient not taking: Reported on 05/11/2021)     carvedilol (COREG) 6.25 MG tablet Take 1 tablet by mouth twice daily (Patient taking differently: Take 6.25 mg by mouth 2 (two) times daily with a meal.) 180 tablet 3   cholecalciferol (VITAMIN D3) 25 MCG (1000 UNIT) tablet Take 1,000 Units by mouth daily. (Patient not taking:  Reported on 05/11/2021)     clopidogrel (PLAVIX) 75 MG tablet Take 75 mg by mouth daily.     dronabinol (MARINOL) 2.5 MG capsule SMARTSIG:2.5 Milligram(s) By Mouth 3 Times Daily     folic acid (FOLVITE) 1 MG tablet Take 1 tablet (1 mg total) by mouth daily. 90 tablet 3   furosemide (LASIX) 20 MG tablet Take 20 mg by mouth daily.     HYDROcodone-acetaminophen (NORCO) 10-325 MG tablet Take 1 tablet by mouth 3 (three) times daily as needed for moderate pain or severe pain.     isosorbide mononitrate (IMDUR) 30 MG 24 hr tablet Take 1  tablet by mouth once daily (Patient taking differently: Take 30 mg by mouth daily.) 90 tablet 3   losartan (COZAAR) 25 MG tablet Take 1 tablet by mouth in the morning and at bedtime.     montelukast (SINGULAIR) 10 MG tablet Take 1 tablet by mouth daily.     Multiple Vitamin (MULTIVITAMIN) tablet Take 1 tablet by mouth daily. Unknown Strength per patient     nitroGLYCERIN (NITROSTAT) 0.4 MG SL tablet Place 1 tablet (0.4 mg total) under the tongue every 5 (five) minutes as needed for chest pain. 25 tablet 0   ofloxacin (OCUFLOX) 0.3 % ophthalmic solution Place 1 drop into both eyes 4 (four) times daily.     Omega-3 Fatty Acids (FISH OIL) 1200 MG CPDR Take 1 tablet by mouth daily.     omeprazole (PRILOSEC) 40 MG capsule Take 1 capsule by mouth daily as needed (Acid reflux).     ondansetron (ZOFRAN) 4 MG tablet TAKE 1 TABLET BY MOUTH EVERY 4 HOURS AS NEEDED FOR NAUSEA OR VOMITING (Patient taking differently: Take 4 mg by mouth every 8 (eight) hours as needed for nausea or vomiting.) 30 tablet 2   pantoprazole (PROTONIX) 40 MG tablet Take 40 mg by mouth daily.     prochlorperazine (COMPAZINE) 10 MG tablet Take 1 tablet (10 mg total) by mouth every 6 (six) hours as needed for nausea or vomiting. (Patient not taking: Reported on 05/11/2021) 90 tablet 3   sacubitril-valsartan (ENTRESTO) 24-26 MG Take 1 tablet by mouth daily. Once a day per patient     tamsulosin (FLOMAX) 0.4 MG CAPS capsule Take 0.4 mg by mouth daily.      vitamin B-12 (CYANOCOBALAMIN) 250 MCG tablet Take 250 mcg by mouth daily.     Vitamin D, Ergocalciferol, 50 MCG (2000 UT) CAPS Take 1 tablet by mouth daily.     No current facility-administered medications for this visit.   Facility-Administered Medications Ordered in Other Visits  Medication Dose Route Frequency Provider Last Rate Last Admin   sodium chloride flush (NS) 0.9 % injection 10 mL  10 mL Intracatheter PRN Lewis, Dequincy A, MD   10 mL at 04/18/21 1649    HISTORY OF  PRESENT ILLNESS:   Oncology History  Malignant tumor of extrahepatic bile duct ([facility])  07/29/2020 Initial Diagnosis   Bile duct cancer ([place])   10/20/2020 -  Chemotherapy   Patient is on Treatment Plan : BILIARY TRACT Gemcitabine D1,15 q28d (Days changed 03/06/21, Cisplatin dropped 05/02/21)     11/19/2020 Cancer Staging   Staging form: Distal Bile Duct, AJCC 8th Edition - Pathologic: Stage IIB (pT2, pN1, cM0) - Signed by [name], MD on 11/19/2020 Histopathologic type: Adenocarcinoma, NOS Stage prefix: Initial diagnosis Total positive nodes: 1 Total nodes examined: 5    Antineoplastic chemotherapy induced anemia      REVIEW OF SYSTEMS:  Constitutional: Denies fevers, chills or abnormal weight loss Eyes: Denies blurriness of vision Ears, nose, mouth, throat, and face: Denies mucositis or sore throat Respiratory: Denies cough, dyspnea or wheezes Cardiovascular: Denies palpitation, chest discomfort or lower extremity swelling Gastrointestinal:  Denies nausea, heartburn or change in bowel habits Skin: Denies abnormal skin rashes Lymphatics: Denies new lymphadenopathy or easy bruising Neurological:Denies numbness, tingling or new weaknesses Behavioral/Psych: Mood is stable, no new changes  All other systems were reviewed with the patient and are negative.   VITALS:  Blood pressure 138/88, pulse 70, temperature 98.5 F (36.9 C), temperature source Oral, resp. rate 20, height $RemoveBe'6\' 2"'FjlhdzbSG$  (1.88 m), weight 245 lb 1.6 oz (111.2 kg), SpO2 95 %.  Wt Readings from Last 3 Encounters:  09/21/21 245 lb 1.6 oz (111.2 kg)  09/15/21 252 lb (114.3 kg)  09/01/21 250 lb (113.4 kg)    Body mass index is 31.47 kg/m.  Performance status (ECOG): 1 - Symptomatic but completely ambulatory  PHYSICAL EXAM:   GENERAL:alert, no distress and comfortable SKIN: skin color, texture, turgor are normal, no rashes or significant lesions EYES: normal, Conjunctiva are pink and non-injected, sclera  clear OROPHARYNX:no exudate, no erythema and lips, buccal mucosa, and tongue normal  NECK: supple, thyroid normal size, non-tender, without nodularity LYMPH:  no palpable lymphadenopathy in the cervical, axillary or inguinal LUNGS: clear to auscultation and percussion with normal breathing effort HEART: regular rate & rhythm and no murmurs and no lower extremity edema ABDOMEN:abdomen soft, non-tender and normal bowel sounds Musculoskeletal:no cyanosis of digits and no clubbing  NEURO: alert & oriented x 3 with fluent speech, no focal motor/sensory deficits  LABORATORY DATA:  I have reviewed the data as listed    Component Value Date/Time   NA 141 09/21/2021 0000   K 4.6 09/21/2021 0000   CL 107 09/21/2021 0000   CO2 29 (A) 09/21/2021 0000   GLUCOSE 123 (H) 07/13/2020 0327   BUN 21 09/21/2021 0000   CREATININE 1.2 09/21/2021 0000   CREATININE 1.20 07/13/2020 0327   CALCIUM 8.6 (A) 09/21/2021 0000   PROT 6.9 07/13/2020 0327   ALBUMIN 3.8 09/21/2021 0000   AST 32 09/21/2021 0000   ALT 18 09/21/2021 0000   ALKPHOS 57 09/21/2021 0000   BILITOT 1.5 (H) 07/13/2020 0327   GFRNONAA 42 (A) 01/09/2021 1117   GFRNONAA 58 (L) 07/13/2020 0327   GFRAA 65 07/08/2020 1411    No results found for: SPEP, UPEP  Lab Results  Component Value Date   WBC 4.2 09/21/2021   NEUTROABS 2.77 09/21/2021   HGB 9.8 (A) 09/21/2021   HCT 30 (A) 09/21/2021   MCV 97 (A) 08/10/2021   PLT 160 09/21/2021      Chemistry      Component Value Date/Time   NA 141 09/21/2021 0000   K 4.6 09/21/2021 0000   CL 107 09/21/2021 0000   CO2 29 (A) 09/21/2021 0000   BUN 21 09/21/2021 0000   CREATININE 1.2 09/21/2021 0000   CREATININE 1.20 07/13/2020 0327   GLU 86 09/21/2021 0000      Component Value Date/Time   CALCIUM 8.6 (A) 09/21/2021 0000   ALKPHOS 57 09/21/2021 0000   AST 32 09/21/2021 0000   ALT 18 09/21/2021 0000   BILITOT 1.5 (H) 07/13/2020 0327       RADIOGRAPHIC STUDIES: I have personally  reviewed the radiological images as listed and agreed with the findings in the report. No results found.

## 2021-09-21 ENCOUNTER — Inpatient Hospital Stay (INDEPENDENT_AMBULATORY_CARE_PROVIDER_SITE_OTHER): Payer: Medicare Other | Admitting: Hematology and Oncology

## 2021-09-21 ENCOUNTER — Encounter: Payer: Self-pay | Admitting: Hematology and Oncology

## 2021-09-21 ENCOUNTER — Inpatient Hospital Stay: Payer: Medicare Other

## 2021-09-21 ENCOUNTER — Telehealth: Payer: Self-pay | Admitting: Oncology

## 2021-09-21 DIAGNOSIS — C240 Malignant neoplasm of extrahepatic bile duct: Secondary | ICD-10-CM

## 2021-09-21 LAB — HEPATIC FUNCTION PANEL
ALT: 18 (ref 10–40)
AST: 32 (ref 14–40)
Alkaline Phosphatase: 57 (ref 25–125)
Bilirubin, Total: 0.9

## 2021-09-21 LAB — CBC AND DIFFERENTIAL
HCT: 30 — AB (ref 41–53)
Hemoglobin: 9.8 — AB (ref 13.5–17.5)
Neutrophils Absolute: 2.77
Platelets: 160 (ref 150–399)
WBC: 4.2

## 2021-09-21 LAB — BASIC METABOLIC PANEL
BUN: 21 (ref 4–21)
CO2: 29 — AB (ref 13–22)
Chloride: 107 (ref 99–108)
Creatinine: 1.2 (ref 0.6–1.3)
Glucose: 86
Potassium: 4.6 (ref 3.4–5.3)
Sodium: 141 (ref 137–147)

## 2021-09-21 LAB — COMPREHENSIVE METABOLIC PANEL
Albumin: 3.8 (ref 3.5–5.0)
Calcium: 8.6 — AB (ref 8.7–10.7)

## 2021-09-21 LAB — CBC: RBC: 3.19 — AB (ref 3.87–5.11)

## 2021-09-21 NOTE — Assessment & Plan Note (Signed)
A 80 y.o. male with metastatic FGFR2 mutation positive cholangiocarcinoma. Recent abdominal MRI images show that his disease remains stable.  There remain no new signs of disease progression.He will proceed with his 14th cycle of treatment next week.  His gemcitabine treatments will remain spaced out to where they will be on a day 1,15 regimen, with each cycle being spaced out to every 4 weeks.  As his hemoglobin remains below 10, he will continue to receive Retacrit every 2 weeks with each gemcitabine treatment.  Clinically, he is doing fine. He will return to clinic in 4 weeks before he heads into his 15th cycle of therapy.

## 2021-09-21 NOTE — Telephone Encounter (Signed)
Patient requested adjustment to 1/5, 1/6 Appt's.  Labs 1/4, Infusion 1/9 - Adjusted other Appt's to Revised Schedule.  Ok per Carter Kitten patient Appt Summary

## 2021-09-27 ENCOUNTER — Inpatient Hospital Stay: Payer: Medicare Other | Attending: Oncology

## 2021-09-27 ENCOUNTER — Other Ambulatory Visit: Payer: Self-pay | Admitting: Hematology and Oncology

## 2021-09-27 DIAGNOSIS — C240 Malignant neoplasm of extrahepatic bile duct: Secondary | ICD-10-CM | POA: Insufficient documentation

## 2021-09-27 DIAGNOSIS — Z5111 Encounter for antineoplastic chemotherapy: Secondary | ICD-10-CM | POA: Insufficient documentation

## 2021-09-27 LAB — CBC AND DIFFERENTIAL
HCT: 31 — AB (ref 41–53)
Hemoglobin: 10.1 — AB (ref 13.5–17.5)
Neutrophils Absolute: 3.48
Platelets: 123 — AB (ref 150–399)
WBC: 5.2

## 2021-09-27 LAB — CBC: RBC: 3.26 — AB (ref 3.87–5.11)

## 2021-09-28 ENCOUNTER — Other Ambulatory Visit: Payer: Medicare Other

## 2021-09-29 ENCOUNTER — Other Ambulatory Visit: Payer: Self-pay

## 2021-09-29 ENCOUNTER — Encounter: Payer: Self-pay | Admitting: Oncology

## 2021-09-29 ENCOUNTER — Ambulatory Visit: Payer: Medicare Other

## 2021-09-29 DIAGNOSIS — R6889 Other general symptoms and signs: Secondary | ICD-10-CM

## 2021-09-29 MED FILL — Gemcitabine HCl Inj 1 GM/26.3ML (38 MG/ML) (Base Equiv): INTRAVENOUS | Qty: 49 | Status: AC

## 2021-09-29 MED FILL — Dexamethasone Sodium Phosphate Inj 100 MG/10ML: INTRAMUSCULAR | Qty: 1 | Status: AC

## 2021-09-29 NOTE — Addendum Note (Signed)
Addended by: Juanetta Beets on: 09/29/2021 04:11 PM   Modules accepted: Orders

## 2021-10-02 ENCOUNTER — Inpatient Hospital Stay: Payer: Medicare Other

## 2021-10-02 ENCOUNTER — Other Ambulatory Visit: Payer: Self-pay

## 2021-10-02 VITALS — BP 126/69 | HR 62 | Temp 98.3°F | Resp 18 | Ht 74.0 in | Wt 243.0 lb

## 2021-10-02 DIAGNOSIS — C240 Malignant neoplasm of extrahepatic bile duct: Secondary | ICD-10-CM | POA: Diagnosis present

## 2021-10-02 DIAGNOSIS — Z5111 Encounter for antineoplastic chemotherapy: Secondary | ICD-10-CM | POA: Diagnosis present

## 2021-10-02 MED ORDER — SODIUM CHLORIDE 0.9 % IV SOLN
750.0000 mg/m2 | Freq: Once | INTRAVENOUS | Status: AC
  Administered 2021-10-02: 1862 mg via INTRAVENOUS
  Filled 2021-10-02: qty 48.97

## 2021-10-02 MED ORDER — SODIUM CHLORIDE 0.9 % IV SOLN: Freq: Once | INTRAVENOUS | Status: AC

## 2021-10-02 MED ORDER — SODIUM CHLORIDE 0.9 % IV SOLN
10.0000 mg | Freq: Once | INTRAVENOUS | Status: AC
  Administered 2021-10-02: 10 mg via INTRAVENOUS
  Filled 2021-10-02: qty 10

## 2021-10-02 MED ORDER — SODIUM CHLORIDE 0.9% FLUSH
10.0000 mL | INTRAVENOUS | Status: DC | PRN
  Administered 2021-10-02: 10 mL

## 2021-10-02 NOTE — Patient Instructions (Signed)
Gary [name]  Discharge Instructions: Thank you for choosing [facility] to provide your oncology and hematology care.  If you have a lab appointment with the [facility], please go directly to the [facility] and check in at the registration area.   Wear comfortable clothing and clothing appropriate for easy access to any Portacath or PICC line.   We strive to give you quality time with your provider. You may need to reschedule your appointment if you arrive late (15 or more minutes).  Arriving late affects you and other patients whose appointments are after yours.  Also, if you miss three or more appointments without notifying the office, you may be dismissed from the clinic at the providers discretion.      For prescription refill requests, have your pharmacy contact our office and allow 72 hours for refills to be completed.    Today you received the following chemotherapy and/or immunotherapy agents gemzar   To help prevent nausea and vomiting after your treatment, we encourage you to take your nausea medication as directed.  BELOW ARE SYMPTOMS THAT SHOULD BE REPORTED IMMEDIATELY: *FEVER GREATER THAN 100.4 F (38 C) OR HIGHER *CHILLS OR SWEATING *NAUSEA AND VOMITING THAT IS NOT CONTROLLED WITH YOUR NAUSEA MEDICATION *UNUSUAL SHORTNESS OF BREATH *UNUSUAL BRUISING OR BLEEDING *URINARY PROBLEMS (pain or burning when urinating, or frequent urination) *BOWEL PROBLEMS (unusual diarrhea, constipation, pain near the anus) TENDERNESS IN MOUTH AND THROAT WITH OR WITHOUT PRESENCE OF ULCERS (sore throat, sores in mouth, or a toothache) UNUSUAL RASH, SWELLING OR PAIN  UNUSUAL VAGINAL DISCHARGE OR ITCHING   Items with * indicate a potential emergency and should be followed up as soon as possible or go to the Emergency Department if any problems should occur.  Please show the CHEMOTHERAPY ALERT CARD or IMMUNOTHERAPY ALERT CARD at check-in to the Emergency  Department and triage nurse.  Should you have questions after your visit or need to cancel or reschedule your appointment, please contact [facility]  Dept: [phone]  and follow the prompts.  Office hours are 8:00 a.m. to 4:30 p.m. Monday - Friday. Please note that voicemails left after 4:00 p.m. may not be returned until the following business day.  We are closed weekends and major holidays. You have access to a nurse at all times for urgent questions. Please call the main number to the clinic Dept: [phone] and follow the prompts.  For any non-urgent questions, you may also contact your provider using MyChart. We now offer e-Visits for anyone 82 and older to request care online for non-urgent symptoms. For details visit [email or web address].   Also download the MyChart app! Go to the app store, search "MyChart", open the app, select [facility], and log in with your MyChart username and password.  Due to Covid, a mask is required upon entering the hospital/clinic. If you do not have a mask, one will be given to you upon arrival. For doctor visits, patients may have 1 support person aged 59 or older with them. For treatment visits, patients cannot have anyone with them due to current Covid guidelines and our immunocompromised population.   Gemcitabine injection What is this medication? GEMCITABINE (jem SYE ta been) is a chemotherapy drug. This medicine is used to treat many types of cancer like breast cancer, lung cancer, pancreatic cancer, and ovarian cancer. This medicine may be used for other purposes; ask your health care provider or pharmacist if you have questions.  COMMON BRAND NAME(S): Gemzar, Infugem What should I tell my care team before I take this medication? They need to know if you have any of these conditions: blood disorders infection kidney disease liver disease lung or breathing disease, like asthma recent or ongoing radiation therapy an  unusual or allergic reaction to gemcitabine, other chemotherapy, other medicines, foods, dyes, or preservatives pregnant or trying to get pregnant breast-feeding How should I use this medication? This drug is given as an infusion into a vein. It is administered in a hospital or clinic by a specially trained health care professional. Talk to your pediatrician regarding the use of this medicine in children. Special care may be needed. Overdosage: If you think you have taken too much of this medicine contact a poison control center or emergency room at once. NOTE: This medicine is only for you. Do not share this medicine with others. What if I miss a dose? It is important not to miss your dose. Call your doctor or health care professional if you are unable to keep an appointment. What may interact with this medication? medicines to increase blood counts like filgrastim, pegfilgrastim, sargramostim some other chemotherapy drugs like cisplatin vaccines Talk to your doctor or health care professional before taking any of these medicines: acetaminophen aspirin ibuprofen ketoprofen naproxen This list may not describe all possible interactions. Give your health care provider a list of all the medicines, herbs, non-prescription drugs, or dietary supplements you use. Also tell them if you smoke, drink alcohol, or use illegal drugs. Some items may interact with your medicine. What should I watch for while using this medication? Visit your doctor for checks on your progress. This drug may make you feel generally unwell. This is not uncommon, as chemotherapy can affect healthy cells as well as cancer cells. Report any side effects. Continue your course of treatment even though you feel ill unless your doctor tells you to stop. In some cases, you may be given additional medicines to help with side effects. Follow all directions for their use. Call your doctor or health care professional for advice if you  get a fever, chills or sore throat, or other symptoms of a cold or flu. Do not treat yourself. This drug decreases your body's ability to fight infections. Try to avoid being around people who are sick. This medicine may increase your risk to bruise or bleed. Call your doctor or health care professional if you notice any unusual bleeding. Be careful brushing and flossing your teeth or using a toothpick because you may get an infection or bleed more easily. If you have any dental work done, tell your dentist you are receiving this medicine. Avoid taking products that contain aspirin, acetaminophen, ibuprofen, naproxen, or ketoprofen unless instructed by your doctor. These medicines may hide a fever. Do not become pregnant while taking this medicine or for 6 months after stopping it. Women should inform their doctor if they wish to become pregnant or think they might be pregnant. Men should not father a child while taking this medicine and for 3 months after stopping it. There is a potential for serious side effects to an unborn child. Talk to your health care professional or pharmacist for more information. Do not breast-feed an infant while taking this medicine or for at least 1 week after stopping it. Men should inform their doctors if they wish to father a child. This medicine may lower sperm counts. Talk with your doctor or health care professional if you are concerned  about your fertility. What side effects may I notice from receiving this medication? Side effects that you should report to your doctor or health care professional as soon as possible: allergic reactions like skin rash, itching or hives, swelling of the face, lips, or tongue breathing problems pain, redness, or irritation at site where injected signs and symptoms of a dangerous change in heartbeat or heart rhythm like chest pain; dizziness; fast or irregular heartbeat; palpitations; feeling faint or lightheaded, falls; breathing  problems signs of decreased platelets or bleeding - bruising, pinpoint red spots on the skin, black, tarry stools, blood in the urine signs of decreased red blood cells - unusually weak or tired, feeling faint or lightheaded, falls signs of infection - fever or chills, cough, sore throat, pain or difficulty passing urine signs and symptoms of kidney injury like trouble passing urine or change in the amount of urine signs and symptoms of liver injury like dark yellow or brown urine; general ill feeling or flu-like symptoms; light-colored stools; loss of appetite; nausea; right upper belly pain; unusually weak or tired; yellowing of the eyes or skin swelling of ankles, feet, hands Side effects that usually do not require medical attention (report to your doctor or health care professional if they continue or are bothersome): constipation diarrhea hair loss loss of appetite nausea rash vomiting This list may not describe all possible side effects. Call your doctor for medical advice about side effects. You may report side effects to FDA at 1-800-FDA-1088. Where should I keep my medication? This drug is given in a hospital or clinic and will not be stored at home. NOTE: This sheet is a summary. It may not cover all possible information. If you have questions about this medicine, talk to your doctor, pharmacist, or health care provider.  2022 [employer]vier/Gold Standard (2017-12-04 00:00:00)

## 2021-10-05 ENCOUNTER — Ambulatory Visit: Payer: Medicare Other | Admitting: Oncology

## 2021-10-06 NOTE — Progress Notes (Signed)
[facility]  [address] [place],  [place]  [address] [phone]  Clinic Day: 10/12/2021  Referring physician: Bonnita Nasuti, MD  This document serves as a record of services personally performed by [name], MD. It was created on their behalf by Curry,Lauren E, a trained medical scribe. The creation of this record is based on the scribe's personal observations and the provider's statements to them.  HISTORY OF PRESENT ILLNESS:  The patient is a 81 y.o. 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  He comes in today prior to his 14th cycle of chemotherapy, which now consists of maintenance gemcitabine.  Cisplatin was discontinued after 8 cycles.  The patient claims to have tolerated his last cycle of treatment fairly well.  He still has occasional syncopal episodes, as well as spells of weakness. As it pertains to his cholangiocarcinoma, he denies having any abdominal pain, distention or other findings which concern him for overt signs of disease progression.   PHYSICAL EXAM:  Blood pressure 136/65, pulse 75, temperature 98 F (36.7 C), resp. rate 18, height $RemoveBe'6\' 2"'USXJPJYlI$  (1.88 m), weight 239 lb 11.2 oz (108.7 kg), SpO2 90 %.  Wt Readings from Last 3 Encounters:  10/12/21 239 lb 11.2 oz (108.7 kg)  10/02/21 243 lb (110.2 kg)  09/21/21 245 lb 1.6 oz (111.2 kg)   Body mass index is 30.78 kg/m. Performance status (ECOG): 1 - Symptomatic but completely ambulatory Physical Exam Constitutional:      General: He is not in acute distress.    Appearance: Normal appearance. He is normal weight.  HENT:     Head: Normocephalic and atraumatic.  Eyes:     General: No scleral icterus.    Extraocular Movements: Extraocular movements intact.     Conjunctiva/sclera: Conjunctivae normal.     Pupils: Pupils are equal, round, and reactive to  light.  Cardiovascular:     Rate and Rhythm: Normal rate and regular rhythm.     Pulses: Normal pulses.     Heart sounds: Normal heart sounds. No murmur heard.   No friction rub. No gallop.  Pulmonary:     Effort: Pulmonary effort is normal. No respiratory distress.     Breath sounds: Normal breath sounds.  Abdominal:     General: Bowel sounds are normal. There is no distension.     Palpations: Abdomen is soft. There is no hepatomegaly, splenomegaly or mass.     Tenderness: There is no abdominal tenderness.  Musculoskeletal:        General: Normal range of motion.     Cervical back: Normal range of motion and neck supple.     Right lower leg: No edema.     Left lower leg: No edema.  Lymphadenopathy:     Cervical: No cervical adenopathy.  Skin:    General: Skin is warm and dry.  Neurological:     General: No focal deficit present.     Mental Status: He is alert and oriented to person, place, and time. Mental status is at baseline.  Psychiatric:        Mood and Affect: Mood normal.        Behavior: Behavior normal.        Thought Content: Thought content normal.        Judgment: Judgment normal.    LABS:    Latest Reference Range & Units 10/12/21 00:00  Sodium 137 - 147  141  Potassium 3.4 - 5.3  4.0  Chloride 99 - 108  104  CO2 13 - 22  28 !  Glucose  108  BUN 4 - 21  26 !  Creatinine 0.6 - 1.3  1.8 !  Calcium 8.7 - 10.7  8.9  Alkaline Phosphatase 25 - 125  58  Albumin 3.5 - 5.0  4.1  AST 14 - 40  28  ALT 10 - 40  21  Bilirubin, Total  0.7  WBC  7.1  RBC 3.87 - 5.11  3.39 !  Hemoglobin 13.5 - 17.5  10.3 !  HCT 41 - 53  32 !  Platelets 150 - 399  99 !  NEUT#  4.97    ASSESSMENT & PLAN:  Assessment/Plan:  A 81 y.o. male with metastatic FGFR2 mutation positive cholangiocarcinoma. He will proceed with his 14th cycle of treatment tomorrow.  His gemcitabine treatments will remain spaced out to where they will be on a day 1,15 regimen, with each cycle being every 4  weeks.   Clinically, he is doing okay.  I will see him back in 4 weeks before he heads into his 15th cycle of therapy.  The patient understands all the plans discussed today and is in agreement with them.     I, Rita Ohara, am acting as scribe for [name], MD    I have reviewed this report as typed by the medical scribe, and it is complete and accurate.  Deniyah Dillavou Macarthur Critchley, MD

## 2021-10-09 ENCOUNTER — Encounter: Payer: Self-pay | Admitting: Oncology

## 2021-10-12 ENCOUNTER — Inpatient Hospital Stay: Payer: Medicare Other

## 2021-10-12 ENCOUNTER — Encounter: Payer: Self-pay | Admitting: Oncology

## 2021-10-12 ENCOUNTER — Inpatient Hospital Stay (INDEPENDENT_AMBULATORY_CARE_PROVIDER_SITE_OTHER): Payer: Medicare Other | Admitting: Oncology

## 2021-10-12 ENCOUNTER — Other Ambulatory Visit: Payer: Self-pay

## 2021-10-12 VITALS — BP 136/65 | HR 75 | Temp 98.0°F | Resp 18 | Ht 74.0 in | Wt 239.7 lb

## 2021-10-12 DIAGNOSIS — C221 Intrahepatic bile duct carcinoma: Secondary | ICD-10-CM | POA: Diagnosis not present

## 2021-10-12 DIAGNOSIS — R519 Headache, unspecified: Secondary | ICD-10-CM | POA: Diagnosis not present

## 2021-10-12 DIAGNOSIS — R6889 Other general symptoms and signs: Secondary | ICD-10-CM

## 2021-10-12 LAB — BASIC METABOLIC PANEL
BUN: 26 — AB (ref 4–21)
CO2: 28 — AB (ref 13–22)
Chloride: 104 (ref 99–108)
Creatinine: 1.8 — AB (ref 0.6–1.3)
Glucose: 108
Potassium: 4 (ref 3.4–5.3)
Sodium: 141 (ref 137–147)

## 2021-10-12 LAB — COMPREHENSIVE METABOLIC PANEL
Albumin: 4.1 (ref 3.5–5.0)
Calcium: 8.9 (ref 8.7–10.7)

## 2021-10-12 LAB — HEPATIC FUNCTION PANEL
ALT: 21 (ref 10–40)
AST: 28 (ref 14–40)
Alkaline Phosphatase: 58 (ref 25–125)
Bilirubin, Total: 0.7

## 2021-10-12 LAB — CBC AND DIFFERENTIAL
HCT: 32 — AB (ref 41–53)
Hemoglobin: 10.3 — AB (ref 13.5–17.5)
Neutrophils Absolute: 4.97
Platelets: 99 — AB (ref 150–399)
WBC: 7.1

## 2021-10-12 LAB — CBC: RBC: 3.39 — AB (ref 3.87–5.11)

## 2021-10-13 ENCOUNTER — Ambulatory Visit: Payer: Medicare Other

## 2021-10-13 MED FILL — Gemcitabine HCl Inj 1 GM/26.3ML (38 MG/ML) (Base Equiv): INTRAVENOUS | Qty: 49 | Status: AC

## 2021-10-13 MED FILL — Dexamethasone Sodium Phosphate Inj 100 MG/10ML: INTRAMUSCULAR | Qty: 1 | Status: AC

## 2021-10-15 ENCOUNTER — Encounter: Payer: Self-pay | Admitting: Oncology

## 2021-10-16 ENCOUNTER — Encounter: Payer: Self-pay | Admitting: Oncology

## 2021-10-16 ENCOUNTER — Inpatient Hospital Stay: Payer: Medicare Other

## 2021-10-16 ENCOUNTER — Other Ambulatory Visit: Payer: Self-pay

## 2021-10-16 VITALS — BP 130/76 | HR 72 | Temp 97.8°F | Resp 18 | Ht 74.0 in | Wt 242.0 lb

## 2021-10-16 DIAGNOSIS — C240 Malignant neoplasm of extrahepatic bile duct: Secondary | ICD-10-CM

## 2021-10-16 DIAGNOSIS — Z5111 Encounter for antineoplastic chemotherapy: Secondary | ICD-10-CM | POA: Diagnosis not present

## 2021-10-16 MED ORDER — ALTEPLASE 2 MG IJ SOLR: 2.0000 mg | Freq: Once | INTRAMUSCULAR | Status: DC | PRN

## 2021-10-16 MED ORDER — SODIUM CHLORIDE 0.9 % IV SOLN: Freq: Once | INTRAVENOUS | Status: AC

## 2021-10-16 MED ORDER — SODIUM CHLORIDE 0.9 % IV SOLN
750.0000 mg/m2 | Freq: Once | INTRAVENOUS | Status: AC
  Administered 2021-10-16: 1862 mg via INTRAVENOUS
  Filled 2021-10-16: qty 48.97

## 2021-10-16 MED ORDER — SODIUM CHLORIDE 0.9 % IV SOLN
10.0000 mg | Freq: Once | INTRAVENOUS | Status: AC
  Administered 2021-10-16: 10 mg via INTRAVENOUS
  Filled 2021-10-16: qty 10

## 2021-10-16 MED ORDER — SODIUM CHLORIDE 0.9% FLUSH
10.0000 mL | INTRAVENOUS | Status: DC | PRN
  Administered 2021-10-16: 10 mL

## 2021-10-16 NOTE — Progress Notes (Signed)
Retacrit held fpr HGB 10.3- Ulice Dash PharmD aware

## 2021-10-16 NOTE — Patient Instructions (Signed)
Gemcitabine injection ?What is this medication? ?GEMCITABINE (jem SYE ta been) is a chemotherapy drug. This medicine is used to treat many types of cancer like breast cancer, lung cancer, pancreatic cancer, and ovarian cancer. ?This medicine may be used for other purposes; ask your health care provider or pharmacist if you have questions. ?COMMON BRAND NAME(S): Gemzar, Infugem ?What should I tell my care team before I take this medication? ?They need to know if you have any of these conditions: ?blood disorders ?infection ?kidney disease ?liver disease ?lung or breathing disease, like asthma ?recent or ongoing radiation therapy ?an unusual or allergic reaction to gemcitabine, other chemotherapy, other medicines, foods, dyes, or preservatives ?pregnant or trying to get pregnant ?breast-feeding ?How should I use this medication? ?This drug is given as an infusion into a vein. It is administered in a hospital or clinic by a specially trained health care professional. ?Talk to your pediatrician regarding the use of this medicine in children. Special care may be needed. ?Overdosage: If you think you have taken too much of this medicine contact a poison control center or emergency room at once. ?NOTE: This medicine is only for you. Do not share this medicine with others. ?What if I miss a dose? ?It is important not to miss your dose. Call your doctor or health care professional if you are unable to keep an appointment. ?What may interact with this medication? ?medicines to increase blood counts like filgrastim, pegfilgrastim, sargramostim ?some other chemotherapy drugs like cisplatin ?vaccines ?Talk to your doctor or health care professional before taking any of these medicines: ?acetaminophen ?aspirin ?ibuprofen ?ketoprofen ?naproxen ?This list may not describe all possible interactions. Give your health care provider a list of all the medicines, herbs, non-prescription drugs, or dietary supplements you use. Also tell  them if you smoke, drink alcohol, or use illegal drugs. Some items may interact with your medicine. ?What should I watch for while using this medication? ?Visit your doctor for checks on your progress. This drug may make you feel generally unwell. This is not uncommon, as chemotherapy can affect healthy cells as well as cancer cells. Report any side effects. Continue your course of treatment even though you feel ill unless your doctor tells you to stop. ?In some cases, you may be given additional medicines to help with side effects. Follow all directions for their use. ?Call your doctor or health care professional for advice if you get a fever, chills or sore throat, or other symptoms of a cold or flu. Do not treat yourself. This drug decreases your body's ability to fight infections. Try to avoid being around people who are sick. ?This medicine may increase your risk to bruise or bleed. Call your doctor or health care professional if you notice any unusual bleeding. ?Be careful brushing and flossing your teeth or using a toothpick because you may get an infection or bleed more easily. If you have any dental work done, tell your dentist you are receiving this medicine. ?Avoid taking products that contain aspirin, acetaminophen, ibuprofen, naproxen, or ketoprofen unless instructed by your doctor. These medicines may hide a fever. ?Do not become pregnant while taking this medicine or for 6 months after stopping it. Women should inform their doctor if they wish to become pregnant or think they might be pregnant. Men should not father a child while taking this medicine and for 3 months after stopping it. There is a potential for serious side effects to an unborn child. Talk to your health care professional   or pharmacist for more information. Do not breast-feed an infant while taking this medicine or for at least 1 week after stopping it. ?Men should inform their doctors if they wish to father a child. This medicine may  lower sperm counts. Talk with your doctor or health care professional if you are concerned about your fertility. ?What side effects may I notice from receiving this medication? ?Side effects that you should report to your doctor or health care professional as soon as possible: ?allergic reactions like skin rash, itching or hives, swelling of the face, lips, or tongue ?breathing problems ?pain, redness, or irritation at site where injected ?signs and symptoms of a dangerous change in heartbeat or heart rhythm like chest pain; dizziness; fast or irregular heartbeat; palpitations; feeling faint or lightheaded, falls; breathing problems ?signs of decreased platelets or bleeding - bruising, pinpoint red spots on the skin, black, tarry stools, blood in the urine ?signs of decreased red blood cells - unusually weak or tired, feeling faint or lightheaded, falls ?signs of infection - fever or chills, cough, sore throat, pain or difficulty passing urine ?signs and symptoms of kidney injury like trouble passing urine or change in the amount of urine ?signs and symptoms of liver injury like dark yellow or brown urine; general ill feeling or flu-like symptoms; light-colored stools; loss of appetite; nausea; right upper belly pain; unusually weak or tired; yellowing of the eyes or skin ?swelling of ankles, feet, hands ?Side effects that usually do not require medical attention (report to your doctor or health care professional if they continue or are bothersome): ?constipation ?diarrhea ?hair loss ?loss of appetite ?nausea ?rash ?vomiting ?This list may not describe all possible side effects. Call your doctor for medical advice about side effects. You may report side effects to FDA at 1-800-FDA-1088. ?Where should I keep my medication? ?This drug is given in a hospital or clinic and will not be stored at home. ?NOTE: This sheet is a summary. It may not cover all possible information. If you have questions about this medicine,  talk to your doctor, pharmacist, or health care provider. ?? 2022 Elsevier/Gold Standard (2017-12-04 00:00:00) ? ?

## 2021-10-19 ENCOUNTER — Encounter: Payer: Self-pay | Admitting: Oncology

## 2021-10-24 ENCOUNTER — Encounter: Payer: Self-pay | Admitting: Oncology

## 2021-10-26 ENCOUNTER — Encounter: Payer: Self-pay | Admitting: Oncology

## 2021-10-29 ENCOUNTER — Other Ambulatory Visit: Payer: Self-pay | Admitting: Hematology and Oncology

## 2021-10-29 DIAGNOSIS — T451X5A Adverse effect of antineoplastic and immunosuppressive drugs, initial encounter: Secondary | ICD-10-CM

## 2021-10-29 DIAGNOSIS — R110 Nausea: Secondary | ICD-10-CM

## 2021-10-30 ENCOUNTER — Encounter: Payer: Self-pay | Admitting: Oncology

## 2021-11-02 ENCOUNTER — Other Ambulatory Visit: Payer: Medicare Other

## 2021-11-03 ENCOUNTER — Ambulatory Visit: Payer: Medicare Other

## 2021-11-03 ENCOUNTER — Other Ambulatory Visit: Payer: Self-pay | Admitting: Hematology and Oncology

## 2021-11-03 ENCOUNTER — Other Ambulatory Visit: Payer: Self-pay

## 2021-11-03 ENCOUNTER — Inpatient Hospital Stay: Payer: Medicare Other | Attending: Oncology

## 2021-11-03 ENCOUNTER — Other Ambulatory Visit: Payer: Self-pay | Admitting: Pharmacist

## 2021-11-03 DIAGNOSIS — C786 Secondary malignant neoplasm of retroperitoneum and peritoneum: Secondary | ICD-10-CM | POA: Insufficient documentation

## 2021-11-03 DIAGNOSIS — Z5111 Encounter for antineoplastic chemotherapy: Secondary | ICD-10-CM | POA: Insufficient documentation

## 2021-11-03 DIAGNOSIS — D6481 Anemia due to antineoplastic chemotherapy: Secondary | ICD-10-CM | POA: Insufficient documentation

## 2021-11-03 DIAGNOSIS — R6889 Other general symptoms and signs: Secondary | ICD-10-CM

## 2021-11-03 DIAGNOSIS — C221 Intrahepatic bile duct carcinoma: Secondary | ICD-10-CM | POA: Insufficient documentation

## 2021-11-03 LAB — CBC AND DIFFERENTIAL
HCT: 31 — AB (ref 41–53)
Hemoglobin: 10.1 — AB (ref 13.5–17.5)
Neutrophils Absolute: 4.42
Platelets: 185 (ref 150–399)
WBC: 6.6

## 2021-11-03 LAB — HEPATIC FUNCTION PANEL
ALT: 16 (ref 10–40)
AST: 26 (ref 14–40)
Alkaline Phosphatase: 55 (ref 25–125)
Bilirubin, Total: 0.8

## 2021-11-03 LAB — COMPREHENSIVE METABOLIC PANEL
Albumin: 3.8 (ref 3.5–5.0)
Calcium: 8.4 — AB (ref 8.7–10.7)

## 2021-11-03 LAB — CBC: RBC: 3.26 — AB (ref 3.87–5.11)

## 2021-11-03 LAB — BASIC METABOLIC PANEL
BUN: 38 — AB (ref 4–21)
CO2: 28 — AB (ref 13–22)
Chloride: 104 (ref 99–108)
Creatinine: 2 — AB (ref 0.6–1.3)
Glucose: 134
Potassium: 4.3 (ref 3.4–5.3)
Sodium: 141 (ref 137–147)

## 2021-11-03 MED FILL — Gemcitabine HCl Inj 1 GM/26.3ML (38 MG/ML) (Base Equiv): INTRAVENOUS | Qty: 49 | Status: AC

## 2021-11-06 ENCOUNTER — Other Ambulatory Visit: Payer: Self-pay

## 2021-11-06 ENCOUNTER — Inpatient Hospital Stay: Payer: Medicare Other

## 2021-11-06 ENCOUNTER — Encounter: Payer: Self-pay | Admitting: Oncology

## 2021-11-06 VITALS — BP 125/65 | HR 76 | Temp 98.1°F | Resp 18 | Ht 74.0 in | Wt 241.0 lb

## 2021-11-06 DIAGNOSIS — C786 Secondary malignant neoplasm of retroperitoneum and peritoneum: Secondary | ICD-10-CM | POA: Diagnosis not present

## 2021-11-06 DIAGNOSIS — C221 Intrahepatic bile duct carcinoma: Secondary | ICD-10-CM | POA: Diagnosis not present

## 2021-11-06 DIAGNOSIS — C240 Malignant neoplasm of extrahepatic bile duct: Secondary | ICD-10-CM

## 2021-11-06 DIAGNOSIS — D6481 Anemia due to antineoplastic chemotherapy: Secondary | ICD-10-CM | POA: Diagnosis not present

## 2021-11-06 DIAGNOSIS — Z5111 Encounter for antineoplastic chemotherapy: Secondary | ICD-10-CM | POA: Diagnosis present

## 2021-11-06 MED ORDER — SODIUM CHLORIDE 0.9% FLUSH
10.0000 mL | INTRAVENOUS | Status: DC | PRN
  Administered 2021-11-06: 10 mL

## 2021-11-06 MED ORDER — SODIUM CHLORIDE 0.9 % IV SOLN
10.0000 mg | Freq: Once | INTRAVENOUS | Status: AC
  Administered 2021-11-06: 10 mg via INTRAVENOUS
  Filled 2021-11-06: qty 1

## 2021-11-06 MED ORDER — SODIUM CHLORIDE 0.9 % IV SOLN: Freq: Once | INTRAVENOUS | Status: AC

## 2021-11-06 MED ORDER — SODIUM CHLORIDE 0.9 % IV SOLN
750.0000 mg/m2 | Freq: Once | INTRAVENOUS | Status: AC
  Administered 2021-11-06: 1862 mg via INTRAVENOUS
  Filled 2021-11-06: qty 48.97

## 2021-11-06 NOTE — Patient Instructions (Signed)
Gemcitabine injection ?What is this medication? ?GEMCITABINE (jem SYE ta been) is a chemotherapy drug. This medicine is used to treat many types of cancer like breast cancer, lung cancer, pancreatic cancer, and ovarian cancer. ?This medicine may be used for other purposes; ask your health care provider or pharmacist if you have questions. ?COMMON BRAND NAME(S): Gemzar, Infugem ?What should I tell my care team before I take this medication? ?They need to know if you have any of these conditions: ?blood disorders ?infection ?kidney disease ?liver disease ?lung or breathing disease, like asthma ?recent or ongoing radiation therapy ?an unusual or allergic reaction to gemcitabine, other chemotherapy, other medicines, foods, dyes, or preservatives ?pregnant or trying to get pregnant ?breast-feeding ?How should I use this medication? ?This drug is given as an infusion into a vein. It is administered in a hospital or clinic by a specially trained health care professional. ?Talk to your pediatrician regarding the use of this medicine in children. Special care may be needed. ?Overdosage: If you think you have taken too much of this medicine contact a poison control center or emergency room at once. ?NOTE: This medicine is only for you. Do not share this medicine with others. ?What if I miss a dose? ?It is important not to miss your dose. Call your doctor or health care professional if you are unable to keep an appointment. ?What may interact with this medication? ?medicines to increase blood counts like filgrastim, pegfilgrastim, sargramostim ?some other chemotherapy drugs like cisplatin ?vaccines ?Talk to your doctor or health care professional before taking any of these medicines: ?acetaminophen ?aspirin ?ibuprofen ?ketoprofen ?naproxen ?This list may not describe all possible interactions. Give your health care provider a list of all the medicines, herbs, non-prescription drugs, or dietary supplements you use. Also tell  them if you smoke, drink alcohol, or use illegal drugs. Some items may interact with your medicine. ?What should I watch for while using this medication? ?Visit your doctor for checks on your progress. This drug may make you feel generally unwell. This is not uncommon, as chemotherapy can affect healthy cells as well as cancer cells. Report any side effects. Continue your course of treatment even though you feel ill unless your doctor tells you to stop. ?In some cases, you may be given additional medicines to help with side effects. Follow all directions for their use. ?Call your doctor or health care professional for advice if you get a fever, chills or sore throat, or other symptoms of a cold or flu. Do not treat yourself. This drug decreases your body's ability to fight infections. Try to avoid being around people who are sick. ?This medicine may increase your risk to bruise or bleed. Call your doctor or health care professional if you notice any unusual bleeding. ?Be careful brushing and flossing your teeth or using a toothpick because you may get an infection or bleed more easily. If you have any dental work done, tell your dentist you are receiving this medicine. ?Avoid taking products that contain aspirin, acetaminophen, ibuprofen, naproxen, or ketoprofen unless instructed by your doctor. These medicines may hide a fever. ?Do not become pregnant while taking this medicine or for 6 months after stopping it. Women should inform their doctor if they wish to become pregnant or think they might be pregnant. Men should not father a child while taking this medicine and for 3 months after stopping it. There is a potential for serious side effects to an unborn child. Talk to your health care professional   or pharmacist for more information. Do not breast-feed an infant while taking this medicine or for at least 1 week after stopping it. ?Men should inform their doctors if they wish to father a child. This medicine may  lower sperm counts. Talk with your doctor or health care professional if you are concerned about your fertility. ?What side effects may I notice from receiving this medication? ?Side effects that you should report to your doctor or health care professional as soon as possible: ?allergic reactions like skin rash, itching or hives, swelling of the face, lips, or tongue ?breathing problems ?pain, redness, or irritation at site where injected ?signs and symptoms of a dangerous change in heartbeat or heart rhythm like chest pain; dizziness; fast or irregular heartbeat; palpitations; feeling faint or lightheaded, falls; breathing problems ?signs of decreased platelets or bleeding - bruising, pinpoint red spots on the skin, black, tarry stools, blood in the urine ?signs of decreased red blood cells - unusually weak or tired, feeling faint or lightheaded, falls ?signs of infection - fever or chills, cough, sore throat, pain or difficulty passing urine ?signs and symptoms of kidney injury like trouble passing urine or change in the amount of urine ?signs and symptoms of liver injury like dark yellow or brown urine; general ill feeling or flu-like symptoms; light-colored stools; loss of appetite; nausea; right upper belly pain; unusually weak or tired; yellowing of the eyes or skin ?swelling of ankles, feet, hands ?Side effects that usually do not require medical attention (report to your doctor or health care professional if they continue or are bothersome): ?constipation ?diarrhea ?hair loss ?loss of appetite ?nausea ?rash ?vomiting ?This list may not describe all possible side effects. Call your doctor for medical advice about side effects. You may report side effects to FDA at 1-800-FDA-1088. ?Where should I keep my medication? ?This drug is given in a hospital or clinic and will not be stored at home. ?NOTE: This sheet is a summary. It may not cover all possible information. If you have questions about this medicine,  talk to your doctor, pharmacist, or health care provider. ?? 2022 Elsevier/Gold Standard (2017-12-04 00:00:00) ? ?

## 2021-11-13 NOTE — Progress Notes (Signed)
[facility]  [address] [place],  [place]  [address] [phone]  Clinic Day: 11/17/2021  Referring physician: Bonnita Nasuti, MD  This document serves as a record of services personally performed by [name], MD. It was created on their behalf by Curry,Lauren E, a trained medical scribe. The creation of this record is based on the scribe's personal observations and the provider's statements to them.  HISTORY OF PRESENT ILLNESS:  The patient is a 81 y.o. 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  He comes in today before he heads into cycle 15, day 15 of chemotherapy, which now consists of maintenance gemcitabine.  Cisplatin was discontinued after 8 cycles.  The patient claims to have tolerated his last cycle of treatment fairly well.  The patient still claims to feel lightheaded on occasion.  He also claims that he feels the room spinning on certain occasions.  He also notices getting lightheaded when he stands up.  He denies having any changes to his cardiac medications.  He is scheduled to see his cardiologist on Monday for reevaluation.  PHYSICAL EXAM:  Blood pressure (!) 80/43, pulse 80, temperature 98.8 F (37.1 C), resp. rate 16, height $RemoveBe'6\' 2"'IurlqgvJJ$  (1.88 m), weight 238 lb 11.2 oz (108.3 kg), SpO2 91 %.  His systolic blood pressure dropped from 118 to 80 upon standing  Wt Readings from Last 3 Encounters:  11/17/21 238 lb 11.2 oz (108.3 kg)  11/06/21 241 lb (109.3 kg)  10/16/21 242 lb (109.8 kg)   Body mass index is 30.65 kg/m. Performance status (ECOG): 1 - Symptomatic but completely ambulatory Physical Exam Constitutional:      General: He is not in acute distress.    Appearance: Normal appearance. He is normal weight.  HENT:     Head: Normocephalic and atraumatic.  Eyes:     General: No scleral icterus.     Extraocular Movements: Extraocular movements intact.     Conjunctiva/sclera: Conjunctivae normal.     Pupils: Pupils are equal, round, and reactive to light.  Cardiovascular:     Rate and Rhythm: Normal rate and regular rhythm.     Pulses: Normal pulses.     Heart sounds: Normal heart sounds. No murmur heard.   No friction rub. No gallop.  Pulmonary:     Effort: Pulmonary effort is normal. No respiratory distress.     Breath sounds: Normal breath sounds.  Abdominal:     General: Bowel sounds are normal. There is no distension.     Palpations: Abdomen is soft. There is no hepatomegaly, splenomegaly or mass.     Tenderness: There is no abdominal tenderness.  Musculoskeletal:        General: Normal range of motion.     Cervical back: Normal range of motion and neck supple.     Right lower leg: No edema.     Left lower leg: No edema.  Lymphadenopathy:     Cervical: No cervical adenopathy.  Skin:    General: Skin is warm and dry.  Neurological:     General: No focal deficit present.     Mental Status: He is alert and oriented to person, place, and time. Mental status is at baseline.  Psychiatric:        Mood and Affect: Mood normal.        Behavior: Behavior normal.  Thought Content: Thought content normal.        Judgment: Judgment normal.    LABS:    Latest Reference Range & Units 11/17/21 00:00  Sodium 137 - 147  139  Potassium 3.4 - 5.3  4.3  Chloride 99 - 108  103  CO2 13 - 22  28 !  Glucose  122  BUN 4 - 21  29 !  Creatinine 0.6 - 1.3  1.6 !  Calcium 8.7 - 10.7  8.9  Alkaline Phosphatase 25 - 125  54  Albumin 3.5 - 5.0  3.8  AST 14 - 40  33  ALT 10 - 40  25  Bilirubin, Total  0.7  WBC  6.8  RBC 3.87 - 5.11  3.11 !  Hemoglobin 13.5 - 17.5  9.7 !  HCT 41 - 53  30 !  Platelets 150 - 399  99 !  NEUT#  4.56    ASSESSMENT & PLAN:  Assessment/Plan:  A 81 y.o. male with metastatic FGFR2 mutation positive cholangiocarcinoma.  Based upon him being very  orthostatic today, he will get 1+ liter of IV fluids today.  As he has a history of congestive heart failure, I do not want him to get fluid overloaded.  However, it definitely appears fluid is necessary today for his severe orthostatic hypotension.  He is scheduled for cycle 15, day 15 of gemcitabine early next week and will continue to receive chemotherapy every 2 weeks.  I will see him back in 6 weeks for repeat clinical assessment.  An abdominal MRI will be repeated at that time to ascertain his new disease baseline after 16 total cycles of gemcitabine-based chemotherapy. The patient understands all the plans discussed today and is in agreement with them.     I, Rita Ohara, am acting as scribe for [name], MD    I have reviewed this report as typed by the medical scribe, and it is complete and accurate.  Heide Brossart Macarthur Critchley, MD

## 2021-11-16 ENCOUNTER — Other Ambulatory Visit: Payer: Medicare Other

## 2021-11-17 ENCOUNTER — Other Ambulatory Visit: Payer: Self-pay | Admitting: Pharmacist

## 2021-11-17 ENCOUNTER — Other Ambulatory Visit: Payer: Self-pay | Admitting: Hematology and Oncology

## 2021-11-17 ENCOUNTER — Encounter: Payer: Self-pay | Admitting: Oncology

## 2021-11-17 ENCOUNTER — Other Ambulatory Visit: Payer: Self-pay

## 2021-11-17 ENCOUNTER — Inpatient Hospital Stay (INDEPENDENT_AMBULATORY_CARE_PROVIDER_SITE_OTHER): Payer: Medicare Other | Admitting: Oncology

## 2021-11-17 ENCOUNTER — Inpatient Hospital Stay: Payer: Medicare Other

## 2021-11-17 ENCOUNTER — Ambulatory Visit: Payer: Medicare Other

## 2021-11-17 ENCOUNTER — Other Ambulatory Visit: Payer: Self-pay | Admitting: Oncology

## 2021-11-17 VITALS — BP 125/61 | HR 60 | Temp 98.8°F | Resp 16 | Ht 74.0 in | Wt 238.7 lb

## 2021-11-17 DIAGNOSIS — C240 Malignant neoplasm of extrahepatic bile duct: Secondary | ICD-10-CM

## 2021-11-17 DIAGNOSIS — R6889 Other general symptoms and signs: Secondary | ICD-10-CM

## 2021-11-17 DIAGNOSIS — D6481 Anemia due to antineoplastic chemotherapy: Secondary | ICD-10-CM | POA: Diagnosis not present

## 2021-11-17 DIAGNOSIS — T451X5A Adverse effect of antineoplastic and immunosuppressive drugs, initial encounter: Secondary | ICD-10-CM

## 2021-11-17 LAB — CBC AND DIFFERENTIAL
HCT: 30 — AB (ref 41–53)
Hemoglobin: 9.7 — AB (ref 13.5–17.5)
Neutrophils Absolute: 4.56
Platelets: 99 — AB (ref 150–399)
WBC: 6.8

## 2021-11-17 LAB — HEPATIC FUNCTION PANEL
ALT: 25 (ref 10–40)
AST: 33 (ref 14–40)
Alkaline Phosphatase: 54 (ref 25–125)
Bilirubin, Total: 0.7

## 2021-11-17 LAB — BASIC METABOLIC PANEL
BUN: 29 — AB (ref 4–21)
CO2: 28 — AB (ref 13–22)
Chloride: 103 (ref 99–108)
Creatinine: 1.6 — AB (ref 0.6–1.3)
Glucose: 122
Potassium: 4.3 (ref 3.4–5.3)
Sodium: 139 (ref 137–147)

## 2021-11-17 LAB — CBC: RBC: 3.11 — AB (ref 3.87–5.11)

## 2021-11-17 LAB — COMPREHENSIVE METABOLIC PANEL
Albumin: 3.8 (ref 3.5–5.0)
Calcium: 8.9 (ref 8.7–10.7)

## 2021-11-17 MED FILL — Dexamethasone Sodium Phosphate Inj 100 MG/10ML: INTRAMUSCULAR | Qty: 1 | Status: AC

## 2021-11-17 MED FILL — Gemcitabine HCl Inj 1 GM/26.3ML (38 MG/ML) (Base Equiv): INTRAVENOUS | Qty: 49 | Status: AC

## 2021-11-20 ENCOUNTER — Encounter: Payer: Self-pay | Admitting: Oncology

## 2021-11-20 ENCOUNTER — Ambulatory Visit (INDEPENDENT_AMBULATORY_CARE_PROVIDER_SITE_OTHER): Payer: Medicare Other | Admitting: Cardiology

## 2021-11-20 ENCOUNTER — Other Ambulatory Visit: Payer: Self-pay

## 2021-11-20 ENCOUNTER — Inpatient Hospital Stay: Payer: Medicare Other

## 2021-11-20 VITALS — BP 118/74 | HR 80 | Ht 74.0 in | Wt 243.4 lb

## 2021-11-20 VITALS — BP 123/57 | HR 71 | Temp 98.0°F | Resp 18 | Ht 73.03 in | Wt 241.8 lb

## 2021-11-20 DIAGNOSIS — I2510 Atherosclerotic heart disease of native coronary artery without angina pectoris: Secondary | ICD-10-CM | POA: Diagnosis not present

## 2021-11-20 DIAGNOSIS — I255 Ischemic cardiomyopathy: Secondary | ICD-10-CM

## 2021-11-20 DIAGNOSIS — I10 Essential (primary) hypertension: Secondary | ICD-10-CM

## 2021-11-20 DIAGNOSIS — D6481 Anemia due to antineoplastic chemotherapy: Secondary | ICD-10-CM

## 2021-11-20 DIAGNOSIS — T451X5A Adverse effect of antineoplastic and immunosuppressive drugs, initial encounter: Secondary | ICD-10-CM

## 2021-11-20 DIAGNOSIS — I5042 Chronic combined systolic (congestive) and diastolic (congestive) heart failure: Secondary | ICD-10-CM

## 2021-11-20 DIAGNOSIS — C240 Malignant neoplasm of extrahepatic bile duct: Secondary | ICD-10-CM

## 2021-11-20 DIAGNOSIS — C221 Intrahepatic bile duct carcinoma: Secondary | ICD-10-CM

## 2021-11-20 DIAGNOSIS — Z5111 Encounter for antineoplastic chemotherapy: Secondary | ICD-10-CM | POA: Diagnosis not present

## 2021-11-20 MED ORDER — SODIUM CHLORIDE 0.9 % IV SOLN
10.0000 mg | Freq: Once | INTRAVENOUS | Status: AC
  Administered 2021-11-20: 10 mg via INTRAVENOUS
  Filled 2021-11-20: qty 10

## 2021-11-20 MED ORDER — SODIUM CHLORIDE 0.9 % IV SOLN
750.0000 mg/m2 | Freq: Once | INTRAVENOUS | Status: AC
  Administered 2021-11-20: 1862 mg via INTRAVENOUS
  Filled 2021-11-20: qty 48.97

## 2021-11-20 MED ORDER — SODIUM CHLORIDE 0.9% FLUSH
10.0000 mL | INTRAVENOUS | Status: DC | PRN
  Administered 2021-11-20: 10 mL

## 2021-11-20 MED ORDER — SODIUM CHLORIDE 0.9 % IV SOLN: Freq: Once | INTRAVENOUS | Status: AC

## 2021-11-20 MED ORDER — EPOETIN ALFA-EPBX 20000 UNIT/ML IJ SOLN
20000.0000 [IU] | Freq: Once | INTRAMUSCULAR | Status: AC
  Administered 2021-11-20: 20000 [IU] via SUBCUTANEOUS
  Filled 2021-11-20: qty 1

## 2021-11-20 NOTE — Patient Instructions (Addendum)
[facility]  Discharge Instructions: Thank you for choosing [facility] to provide your oncology and hematology care.  If you have a lab appointment with the [facility], please go directly to the [facility] and check in at the registration area.   Wear comfortable clothing and clothing appropriate for easy access to any Portacath or PICC line.   We strive to give you quality time with your provider. You may need to reschedule your appointment if you arrive late (15 or more minutes).  Arriving late affects you and other patients whose appointments are after yours.  Also, if you miss three or more appointments without notifying the office, you may be dismissed from the clinic at the providers discretion.      For prescription refill requests, have your pharmacy contact our office and allow 72 hours for refills to be completed.    Today you received the following chemotherapy and/or immunotherapy agents gemcitabine      To help prevent nausea and vomiting after your treatment, we encourage you to take your nausea medication as directed.  BELOW ARE SYMPTOMS THAT SHOULD BE REPORTED IMMEDIATELY: *FEVER GREATER THAN 100.4 F (38 C) OR HIGHER *CHILLS OR SWEATING *NAUSEA AND VOMITING THAT IS NOT CONTROLLED WITH YOUR NAUSEA MEDICATION *UNUSUAL SHORTNESS OF BREATH *UNUSUAL BRUISING OR BLEEDING *URINARY PROBLEMS (pain or burning when urinating, or frequent urination) *BOWEL PROBLEMS (unusual diarrhea, constipation, pain near the anus) TENDERNESS IN MOUTH AND THROAT WITH OR WITHOUT PRESENCE OF ULCERS (sore throat, sores in mouth, or a toothache) UNUSUAL RASH, SWELLING OR PAIN  UNUSUAL VAGINAL DISCHARGE OR ITCHING   Items with * indicate a potential emergency and should be followed up as soon as possible or go to the Emergency Department if any problems should occur.  Please show the CHEMOTHERAPY ALERT CARD or IMMUNOTHERAPY ALERT CARD at check-in to the  Emergency Department and triage nurse.  Should you have questions after your visit or need to cancel or reschedule your appointment, please contact [facility]  Dept: [phone]  and follow the prompts.  Office hours are 8:00 a.m. to 4:30 p.m. Monday - Friday. Please note that voicemails left after 4:00 p.m. may not be returned until the following business day.  We are closed weekends and major holidays. You have access to a nurse at all times for urgent questions. Please call the main number to the clinic Dept: [phone] and follow the prompts.  For any non-urgent questions, you may also contact your provider using MyChart. We now offer e-Visits for anyone 54 and older to request care online for non-urgent symptoms. For details visit [email or web address].   Also download the MyChart app! Go to the app store, search "MyChart", open the app, select [facility], and log in with your MyChart username and password.  Due to Covid, a mask is required upon entering the hospital/clinic. If you do not have a mask, one will be given to you upon arrival. For doctor visits, patients may have 1 support person aged 46 or older with them. For treatment visits, patients cannot have anyone with them due to current Covid guidelines and our immunocompromised population.   Epoetin Alfa injection What is this medication? EPOETIN ALFA (e POE e tin AL fa) helps your body make more red blood cells. This medicine is used to treat anemia caused by chronic kidney disease, cancer chemotherapy, or HIV-therapy. It may also be used before surgery if you have anemia. This  medicine may be used for other purposes; ask your health care provider or pharmacist if you have questions. COMMON BRAND NAME(S): Epogen, Procrit, Retacrit What should I tell my care team before I take this medication? They need to know if you have any of these conditions: cancer heart disease high blood pressure history of  blood clots history of stroke low levels of folate, iron, or vitamin B12 in the blood seizures an unusual or allergic reaction to erythropoietin, albumin, benzyl alcohol, hamster proteins, other medicines, foods, dyes, or preservatives pregnant or trying to get pregnant breast-feeding How should I use this medication? This medicine is for injection into a vein or under the skin. It is usually given by a health care professional in a hospital or clinic setting. If you get this medicine at home, you will be taught how to prepare and give this medicine. Use exactly as directed. Take your medicine at regular intervals. Do not take your medicine more often than directed. It is important that you put your used needles and syringes in a special sharps container. Do not put them in a trash can. If you do not have a sharps container, call your pharmacist or healthcare provider to get one. A special MedGuide will be given to you by the pharmacist with each prescription and refill. Be sure to read this information carefully each time. Talk to your pediatrician regarding the use of this medicine in children. While this drug may be prescribed for selected conditions, precautions do apply. Overdosage: If you think you have taken too much of this medicine contact a poison control center or emergency room at once. NOTE: This medicine is only for you. Do not share this medicine with others. What if I miss a dose? If you miss a dose, take it as soon as you can. If it is almost time for your next dose, take only that dose. Do not take double or extra doses. What may interact with this medication? Interactions have not been studied. This list may not describe all possible interactions. Give your health care provider a list of all the medicines, herbs, non-prescription drugs, or dietary supplements you use. Also tell them if you smoke, drink alcohol, or use illegal drugs. Some items may interact with your  medicine. What should I watch for while using this medication? Your condition will be monitored carefully while you are receiving this medicine. You may need blood work done while you are taking this medicine. This medicine may cause a decrease in vitamin B6. You should make sure that you get enough vitamin B6 while you are taking this medicine. Discuss the foods you eat and the vitamins you take with your health care professional. What side effects may I notice from receiving this medication? Side effects that you should report to your doctor or health care professional as soon as possible: allergic reactions like skin rash, itching or hives, swelling of the face, lips, or tongue seizures signs and symptoms of a blood clot such as breathing problems; changes in vision; chest pain; severe, sudden headache; pain, swelling, warmth in the leg; trouble speaking; sudden numbness or weakness of the face, arm or leg signs and symptoms of a stroke like changes in vision; confusion; trouble speaking or understanding; severe headaches; sudden numbness or weakness of the face, arm or leg; trouble walking; dizziness; loss of balance or coordination Side effects that usually do not require medical attention (report to your doctor or health care professional if they continue or are  bothersome): chills cough dizziness fever headaches joint pain muscle cramps muscle pain nausea, vomiting pain, redness, or irritation at site where injected This list may not describe all possible side effects. Call your doctor for medical advice about side effects. You may report side effects to FDA at 1-800-FDA-1088. Where should I keep my medication? Keep out of the reach of children. Store in a refrigerator between 2 and 8 degrees C (36 and 46 degrees F). Do not freeze or shake. Throw away any unused portion if using a single-dose vial. Multi-dose vials can be kept in the refrigerator for up to 21 days after the initial  dose. Throw away unused medicine. NOTE: This sheet is a summary. It may not cover all possible information. If you have questions about this medicine, talk to your doctor, pharmacist, or health care provider.  2022 Elsevier/Gold Standard (2017-05-14 00:00:00)

## 2021-11-20 NOTE — Addendum Note (Signed)
Addended by: Edwyna Shell I on: 11/20/2021 02:09 PM   Modules accepted: Orders

## 2021-11-20 NOTE — Progress Notes (Signed)
Cardiology Office Note:    Date:  11/20/2021   ID:  Gary, [name] November 10, 1940, MRN [id number]  PCP:  Bonnita Nasuti, MD  Cardiologist:  Jenne Campus, MD    Referring MD: Bonnita Nasuti, MD   Chief Complaint  Patient presents with   Rapid HR    Ongoing for 6 month with minimal ADL's    History of Present Illness:    Gary [name] is a 81 y.o. male  with past medical history significant for coronary artery disease, in July 11, 2020 he did have cardiac catheterization in face of acute non-STEMI.  He was find to have multiple lesions including the culprit lesion which was the mid and distal portion of the circumflex artery.  That was addressed with drug-eluting stent.  He was put on dual antiplatelets therapy and discharged home.  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  Etiology of this cardiomyopathy is at least partially ischemic. He is coming today to my office for follow-up.  Overall he is not doing well he is getting weaker and tired.  He said every time he gets chemotherapy he will get totally exhausted.  Denies have any chest pain tightness squeezing pressure burning chest.  Past Medical History:  Diagnosis Date   AKI (acute kidney injury) (Gary [name]) 07/09/2020   Allergic rhinitis 12/13/2020   Anxiety    Arthritis    Benign prostatic hyperplasia with lower urinary tract symptoms 12/13/2020   Bile duct cancer (Gary [name])    Cardiomegaly 07/29/2020   Cardiomyopathy (Gary [name]) 04/24/2019   CHF (congestive heart failure) (Gary [name])    Cholangiocarcinoma of liver ([place]) 12/29/2017   Chronic obstructive pulmonary disease ([place]) 12/13/2020   Chronic pain syndrome 05/26/4096   Chronic systolic heart failure (Gary [name]) 12/13/2020   Coronary artery disease with PTCA and stenting of mid and distal circumflex artery on 07/11/2020 35/11/2990   Diastolic congestive heart failure (Gary [name]) 04/24/2019   Dizziness 08/30/2020   Dyspnea on exertion 04/24/2019   Encounter  for therapeutic drug level monitoring 06/15/2020   Essential hypertension 04/24/2019   Gastro-esophageal reflux disease without esophagitis 12/13/2020   Generalized anxiety disorder 12/13/2020   Hyperglycemia due to type 2 diabetes mellitus ([place]) 12/13/2020   Hypertension    Hypothyroidism 12/13/2020   Liver tumor 12/27/2017   Formatting of this note might be different from the original. 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/13/2020   Mixed hyperlipidemia 12/13/2020   NSTEMI (non-ST elevated myocardial infarction) ([place]) 07/09/2020   Orthostatic hypotension 10/20/2020   Other long term (current) drug therapy 12/13/2020   Other vitamin B12 deficiency anemias 12/13/2020   Pain in left hip 12/13/2020   Preop cardiovascular exam 06/15/2020   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/30/2020   Type 2 diabetes mellitus without complications (Gary [name]) 01/18/8340   Unsteadiness on feet 08/30/2020   UTI (urinary tract infection) 01/08/2018   Vitamin D deficiency 12/13/2020    Past Surgical History:  Procedure Laterality Date   BILE DUCT EXPLORATION     To remove cancer   CORONARY STENT INTERVENTION N/A 07/11/2020   Procedure: CORONARY STENT INTERVENTION;  Surgeon: Leonie Man, MD;  Location: [facility];  Service: Cardiovascular;  Laterality: N/A;   LEFT HEART CATH AND CORONARY ANGIOGRAPHY N/A 07/11/2020  Procedure: LEFT HEART CATH AND CORONARY ANGIOGRAPHY;  Surgeon: Leonie Man, MD;  Location: [facility];  Service: Cardiovascular;  Laterality: N/A;   MOHS SURGERY     Basil cell on nose    Current Medications: Current Meds  Medication Sig   ALPRAZolam (XANAX) 0.5 MG tablet Take 0.5 mg by mouth 3 (three) times daily as needed for anxiety.    aspirin EC 81 MG  tablet Take 1 tablet (81 mg total) by mouth daily. Swallow whole.   atorvastatin (LIPITOR) 20 MG tablet TAKE 1 TABLET (20 MG TOTAL) BY MOUTH DAILY.   buPROPion (WELLBUTRIN XL) 300 MG 24 hr tablet Take 1 tablet by mouth daily. For 90 days   calcium carbonate (TUMS - DOSED IN MG ELEMENTAL CALCIUM) 500 MG chewable tablet Chew 1 tablet by mouth in the morning and at bedtime. Total of 1200 mg daily   carvedilol (COREG) 6.25 MG tablet Take 1 tablet by mouth twice daily (Patient taking differently: Take 6.25 mg by mouth 2 (two) times daily with a meal.)   cholecalciferol (VITAMIN D3) 25 MCG (1000 UNIT) tablet Take 1,000 Units by mouth daily.   clopidogrel (PLAVIX) 75 MG tablet Take 75 mg by mouth daily.   dronabinol (MARINOL) 2.5 MG capsule SMARTSIG:2.5 Milligram(s) By Mouth 3 Times Daily   folic acid (FOLVITE) 1 MG tablet Take 1 tablet (1 mg total) by mouth daily.   furosemide (LASIX) 20 MG tablet Take 20 mg by mouth daily.   HYDROcodone-acetaminophen (NORCO) 10-325 MG tablet Take 1 tablet by mouth 3 (three) times daily as needed for moderate pain or severe pain.   isosorbide mononitrate (IMDUR) 30 MG 24 hr tablet Take 1 tablet by mouth once daily (Patient taking differently: Take 30 mg by mouth daily.)   losartan (COZAAR) 25 MG tablet Take 1 tablet by mouth in the morning and at bedtime.   montelukast (SINGULAIR) 10 MG tablet Take 1 tablet by mouth daily.   Multiple Vitamin (MULTIVITAMIN) tablet Take 1 tablet by mouth daily. Unknown Strength per patient   nitroGLYCERIN (NITROSTAT) 0.4 MG SL tablet Place 1 tablet (0.4 mg total) under the tongue every 5 (five) minutes as needed for chest pain.   ofloxacin (OCUFLOX) 0.3 % ophthalmic solution Place 1 drop into both eyes 4 (four) times daily.   Omega-3 Fatty Acids (FISH OIL) 1200 MG CPDR Take 1 tablet by mouth daily.   omeprazole (PRILOSEC) 40 MG capsule Take 1 capsule by mouth daily as needed (Acid reflux).   ondansetron (ZOFRAN) 4 MG tablet TAKE 1 TABLET  BY MOUTH EVERY 4 HOURS AS NEEDED FOR NAUSEA OR VOMITING   pantoprazole (PROTONIX) 40 MG tablet Take 40 mg by mouth daily.   prochlorperazine (COMPAZINE) 10 MG tablet Take 1 tablet (10 mg total) by mouth every 6 (six) hours as needed for nausea or vomiting.   sacubitril-valsartan (ENTRESTO) 24-26 MG Take 1 tablet by mouth daily. Once a day per patient   tamsulosin (FLOMAX) 0.4 MG CAPS capsule Take 0.4 mg by mouth daily.    vitamin B-12 (CYANOCOBALAMIN) 250 MCG tablet Take 250 mcg by mouth daily.   Vitamin D, Ergocalciferol, 50 MCG (2000 UT) CAPS Take 1 tablet by mouth daily.     Allergies:   Heparin, Pork-derived products, and Amoxicillin   Social History   Socioeconomic History   Marital status: Widowed    Spouse name: Not on file   Number of children: Not on file   Years of education: Not on file  Highest education level: Not on file  Occupational History   Not on file  Tobacco Use   Smoking status: Never   Smokeless tobacco: Current    Types: Chew  Substance and Sexual Activity   Alcohol use: Not Currently   Drug use: Never   Sexual activity: Not on file  Other Topics Concern   Not on file  Social History Narrative   Not on file   Social Determinants of Health   Financial Resource Strain: Not on file  Food Insecurity: Not on file  Transportation Needs: Not on file  Physical Activity: Not on file  Stress: Not on file  Social Connections: Not on file     Family History: The patient's family history includes Breast cancer in his sister and sister; Cancer in his father. ROS:   Please see the history of present illness.    All 14 point review of systems negative except as described per history of present illness  EKGs/Labs/Other Studies Reviewed:      Recent Labs: 06/14/2021: Magnesium 1.8 11/17/2021: ALT 25; BUN 29; Creatinine 1.6; Hemoglobin 9.7; Platelets 99; Potassium 4.3; Sodium 139  Recent Lipid Panel    Component Value Date/Time   CHOL 139 07/09/2020  0354   TRIG 255 (H) 07/09/2020 0354   HDL 33 (L) 07/09/2020 0354   CHOLHDL 4.2 07/09/2020 0354   VLDL 51 (H) 07/09/2020 0354   LDLCALC 55 07/09/2020 0354    Physical Exam:    VS:  There were no vitals taken for this visit.    Wt Readings from Last 3 Encounters:  11/20/21 241 lb 12 oz (109.7 kg)  11/17/21 238 lb 11.2 oz (108.3 kg)  11/06/21 241 lb (109.3 kg)     GEN:  Well nourished, well developed in no acute distress, pale looking HEENT: Normal NECK: No JVD; No carotid bruits LYMPHATICS: No lymphadenopathy CARDIAC: RRR, no murmurs, no rubs, no gallops RESPIRATORY:  Clear to auscultation without rales, wheezing or rhonchi  ABDOMEN: Soft, non-tender, non-distended MUSCULOSKELETAL:  No edema; No deformity  SKIN: Warm and dry LOWER EXTREMITIES: no swelling NEUROLOGIC:  Alert and oriented x 3 PSYCHIATRIC:  Normal affect   ASSESSMENT:    1. Ischemic cardiomyopathy   2. Essential hypertension   3. Chronic combined systolic and diastolic congestive heart failure ([place])   4. Coronary artery disease involving native coronary artery of native heart without angina pectoris   5. Cholangiocarcinoma of liver ([place])    PLAN:    In order of problems listed above:  History of ischemic cardiomyopathy I will schedule him to have another echocardiogram done to reassess left ventricle ejection fraction.  I do this for 2 reasons #1 we need to figure out what is the reason for his fatigue tiredness which most likely sees hepatic cancer as well as chemotherapy however I will make sure cardiomyopathy is not deteriorating.  On top of that oncologist had some issue with giving him too much fluid with concerns of significantly reduced left ventricle ejection fraction and therefore need to be checked again. Essential hypertension blood pressure seems to well controlled continue present management. Dyslipidemia I did review K PN which show LDL 55 HDL 33.  Good cholesterol profile History of coronary  artery disease, no new issues.  Continue present management Cholangiocarcinoma of the liver.  Follow-up excellently by oncology team   Medication Adjustments/Labs and Tests Ordered: Current medicines are reviewed at length with the patient today.  Concerns regarding medicines are outlined above.  No orders  of the defined types were placed in this encounter.  Medication changes: No orders of the defined types were placed in this encounter.   Signed, Park Liter, MD, [facility] 11/20/2021 1:50 PM    [facility]

## 2021-11-20 NOTE — Patient Instructions (Signed)

## 2021-11-21 ENCOUNTER — Ambulatory Visit (INDEPENDENT_AMBULATORY_CARE_PROVIDER_SITE_OTHER): Payer: Medicare Other

## 2021-11-21 DIAGNOSIS — C221 Intrahepatic bile duct carcinoma: Secondary | ICD-10-CM

## 2021-11-21 DIAGNOSIS — I5042 Chronic combined systolic (congestive) and diastolic (congestive) heart failure: Secondary | ICD-10-CM

## 2021-11-21 DIAGNOSIS — I255 Ischemic cardiomyopathy: Secondary | ICD-10-CM

## 2021-11-21 DIAGNOSIS — I2510 Atherosclerotic heart disease of native coronary artery without angina pectoris: Secondary | ICD-10-CM | POA: Diagnosis not present

## 2021-11-21 DIAGNOSIS — I10 Essential (primary) hypertension: Secondary | ICD-10-CM

## 2021-11-21 LAB — ECHOCARDIOGRAM COMPLETE
Area-P 1/2: 3.77 cm2
Calc EF: 46.7 %
S' Lateral: 3.5 cm
Single Plane A2C EF: 45.3 %
Single Plane A4C EF: 45.1 %

## 2021-11-28 ENCOUNTER — Telehealth: Payer: Self-pay | Admitting: Cardiology

## 2021-11-28 NOTE — Telephone Encounter (Signed)
Patient returned call. Transferred to RN.  ?

## 2021-11-28 NOTE — Telephone Encounter (Signed)
Patient informed of result.

## 2021-11-30 ENCOUNTER — Encounter: Payer: Self-pay | Admitting: Hematology and Oncology

## 2021-11-30 ENCOUNTER — Inpatient Hospital Stay: Payer: Medicare Other | Attending: Oncology

## 2021-11-30 ENCOUNTER — Other Ambulatory Visit: Payer: Self-pay

## 2021-11-30 DIAGNOSIS — R6889 Other general symptoms and signs: Secondary | ICD-10-CM

## 2021-11-30 DIAGNOSIS — C240 Malignant neoplasm of extrahepatic bile duct: Secondary | ICD-10-CM | POA: Insufficient documentation

## 2021-11-30 DIAGNOSIS — Z5111 Encounter for antineoplastic chemotherapy: Secondary | ICD-10-CM | POA: Insufficient documentation

## 2021-11-30 LAB — COMPREHENSIVE METABOLIC PANEL
Albumin: 3.8 (ref 3.5–5.0)
Calcium: 8.7 (ref 8.7–10.7)

## 2021-11-30 LAB — CBC AND DIFFERENTIAL
HCT: 29 — AB (ref 41–53)
Hemoglobin: 9.4 — AB (ref 13.5–17.5)
Neutrophils Absolute: 2.98
Platelets: 111 10*3/uL — AB (ref 150–400)
WBC: 4.8

## 2021-11-30 LAB — HEPATIC FUNCTION PANEL
ALT: 19 U/L (ref 10–40)
AST: 29 (ref 14–40)
Alkaline Phosphatase: 54 (ref 25–125)
Bilirubin, Total: 0.6

## 2021-11-30 LAB — BASIC METABOLIC PANEL
BUN: 28 — AB (ref 4–21)
CO2: 31 — AB (ref 13–22)
Chloride: 105 (ref 99–108)
Creatinine: 1.6 — AB (ref 0.6–1.3)
Glucose: 113
Potassium: 4.7 mEq/L (ref 3.5–5.1)
Sodium: 140 (ref 137–147)

## 2021-11-30 LAB — CBC: RBC: 3.08 — AB (ref 3.87–5.11)

## 2021-12-01 ENCOUNTER — Other Ambulatory Visit: Payer: Self-pay | Admitting: Pharmacist

## 2021-12-07 ENCOUNTER — Other Ambulatory Visit: Payer: Self-pay

## 2021-12-07 ENCOUNTER — Inpatient Hospital Stay: Payer: Medicare Other

## 2021-12-07 LAB — HEPATIC FUNCTION PANEL
ALT: 18 U/L (ref 10–40)
AST: 27 (ref 14–40)
Alkaline Phosphatase: 58 (ref 25–125)
Bilirubin, Total: 0.8

## 2021-12-07 LAB — BASIC METABOLIC PANEL
BUN: 26 — AB (ref 4–21)
CO2: 30 — AB (ref 13–22)
Chloride: 104 (ref 99–108)
Creatinine: 1.8 — AB (ref 0.6–1.3)
Glucose: 107
Potassium: 4.4 mEq/L (ref 3.5–5.1)
Sodium: 139 (ref 137–147)

## 2021-12-07 LAB — CBC AND DIFFERENTIAL
HCT: 30 — AB (ref 41–53)
Hemoglobin: 9.5 — AB (ref 13.5–17.5)
Neutrophils Absolute: 5.04
Platelets: 192 10*3/uL (ref 150–400)
WBC: 6.9

## 2021-12-07 LAB — CBC: RBC: 3.09 — AB (ref 3.87–5.11)

## 2021-12-07 LAB — COMPREHENSIVE METABOLIC PANEL
Albumin: 3.8 (ref 3.5–5.0)
Calcium: 8.5 — AB (ref 8.7–10.7)

## 2021-12-08 ENCOUNTER — Encounter: Payer: Self-pay | Admitting: Oncology

## 2021-12-11 ENCOUNTER — Inpatient Hospital Stay: Payer: Medicare Other

## 2021-12-11 ENCOUNTER — Other Ambulatory Visit: Payer: Self-pay

## 2021-12-11 VITALS — BP 131/75 | HR 62 | Temp 98.0°F | Resp 18 | Ht 74.0 in | Wt 236.8 lb

## 2021-12-11 DIAGNOSIS — C240 Malignant neoplasm of extrahepatic bile duct: Secondary | ICD-10-CM | POA: Diagnosis not present

## 2021-12-11 DIAGNOSIS — Z5111 Encounter for antineoplastic chemotherapy: Secondary | ICD-10-CM | POA: Diagnosis present

## 2021-12-11 MED ORDER — SODIUM CHLORIDE 0.9 % IV SOLN
750.0000 mg/m2 | Freq: Once | INTRAVENOUS | Status: AC
  Administered 2021-12-11: 1862 mg via INTRAVENOUS
  Filled 2021-12-11: qty 48.97

## 2021-12-11 MED ORDER — SODIUM CHLORIDE 0.9 % IV SOLN: Freq: Once | INTRAVENOUS | Status: AC

## 2021-12-11 MED ORDER — SODIUM CHLORIDE 0.9% FLUSH
10.0000 mL | INTRAVENOUS | Status: DC | PRN
  Administered 2021-12-11: 10 mL

## 2021-12-11 MED ORDER — SODIUM CHLORIDE 0.9 % IV SOLN
10.0000 mg | Freq: Once | INTRAVENOUS | Status: AC
  Administered 2021-12-11: 10 mg via INTRAVENOUS
  Filled 2021-12-11: qty 10

## 2021-12-11 NOTE — Patient Instructions (Signed)
Gary [name]  Discharge Instructions: ?Thank you for choosing [facility] to provide your oncology and hematology care.  ?If you have a lab appointment with the [facility], please go directly to the [facility] and check in at the registration area. ?  ?Wear comfortable clothing and clothing appropriate for easy access to any Portacath or PICC line.  ? ?We strive to give you quality time with your provider. You may need to reschedule your appointment if you arrive late (15 or more minutes).  Arriving late affects you and other patients whose appointments are after yours.  Also, if you miss three or more appointments without notifying the office, you may be dismissed from the clinic at the provider?s discretion.    ?  ?For prescription refill requests, have your pharmacy contact our office and allow 72 hours for refills to be completed.   ? ?Today you received the following chemotherapy and/or immunotherapy agents GEMCITABINE    ?  ?To help prevent nausea and vomiting after your treatment, we encourage you to take your nausea medication as directed. ? ?BELOW ARE SYMPTOMS THAT SHOULD BE REPORTED IMMEDIATELY: ?*FEVER GREATER THAN 100.4 F (38 ?C) OR HIGHER ?*CHILLS OR SWEATING ?*NAUSEA AND VOMITING THAT IS NOT CONTROLLED WITH YOUR NAUSEA MEDICATION ?*UNUSUAL SHORTNESS OF BREATH ?*UNUSUAL BRUISING OR BLEEDING ?*URINARY PROBLEMS (pain or burning when urinating, or frequent urination) ?*BOWEL PROBLEMS (unusual diarrhea, constipation, pain near the anus) ?TENDERNESS IN MOUTH AND THROAT WITH OR WITHOUT PRESENCE OF ULCERS (sore throat, sores in mouth, or a toothache) ?UNUSUAL RASH, SWELLING OR PAIN  ?UNUSUAL VAGINAL DISCHARGE OR ITCHING  ? ?Items with * indicate a potential emergency and should be followed up as soon as possible or go to the Emergency Department if any problems should occur. ? ?Please show the CHEMOTHERAPY ALERT CARD or IMMUNOTHERAPY ALERT CARD at check-in to the  Emergency Department and triage nurse. ? ?Should you have questions after your visit or need to cancel or reschedule your appointment, please contact [facility]  Dept: [phone]  and follow the prompts.  Office hours are 8:00 a.m. to 4:30 p.m. Monday - Friday. Please note that voicemails left after 4:00 p.m. may not be returned until the following business day.  We are closed weekends and major holidays. You have access to a nurse at all times for urgent questions. Please call the main number to the clinic Dept: [phone] and follow the prompts. ? ?For any non-urgent questions, you may also contact your provider using MyChart. We now offer e-Visits for anyone 10 and older to request care online for non-urgent symptoms. For details visit [email or web address]. ?  ?Also download the MyChart app! Go to the app store, search "MyChart", open the app, select [facility], and log in with your MyChart username and password. ? ?Due to Covid, a mask is required upon entering the hospital/clinic. If you do not have a mask, one will be given to you upon arrival. For doctor visits, patients may have 1 support person aged 57 or older with them. For treatment visits, patients cannot have anyone with them due to current Covid guidelines and our immunocompromised population.  ? ? ?

## 2021-12-14 ENCOUNTER — Inpatient Hospital Stay: Payer: Medicare Other

## 2021-12-14 ENCOUNTER — Other Ambulatory Visit: Payer: Self-pay

## 2021-12-14 DIAGNOSIS — R6889 Other general symptoms and signs: Secondary | ICD-10-CM

## 2021-12-14 LAB — BASIC METABOLIC PANEL
BUN: 39 — AB (ref 4–21)
CO2: 25 — AB (ref 13–22)
Chloride: 105 (ref 99–108)
Creatinine: 1.6 — AB (ref 0.6–1.3)
Glucose: 104
Potassium: 4.3 mEq/L (ref 3.5–5.1)
Sodium: 140 (ref 137–147)

## 2021-12-14 LAB — CBC: RBC: 3.31 — AB (ref 3.87–5.11)

## 2021-12-14 LAB — HEPATIC FUNCTION PANEL
ALT: 21 U/L (ref 10–40)
AST: 32 (ref 14–40)
Alkaline Phosphatase: 56 (ref 25–125)
Bilirubin, Total: 0.9

## 2021-12-14 LAB — CBC AND DIFFERENTIAL
HCT: 32 — AB (ref 41–53)
Hemoglobin: 10.1 — AB (ref 13.5–17.5)
Neutrophils Absolute: 5.64
Platelets: 180 10*3/uL (ref 150–400)
WBC: 6.8

## 2021-12-14 LAB — COMPREHENSIVE METABOLIC PANEL
Albumin: 3.9 (ref 3.5–5.0)
Calcium: 8.7 (ref 8.7–10.7)

## 2021-12-21 ENCOUNTER — Encounter: Payer: Self-pay | Admitting: Oncology

## 2021-12-22 ENCOUNTER — Inpatient Hospital Stay: Payer: Medicare Other

## 2021-12-22 ENCOUNTER — Other Ambulatory Visit: Payer: Self-pay | Admitting: Pharmacist

## 2021-12-22 DIAGNOSIS — R6889 Other general symptoms and signs: Secondary | ICD-10-CM

## 2021-12-22 LAB — CBC AND DIFFERENTIAL
HCT: 31 — AB (ref 41–53)
Hemoglobin: 9.8 — AB (ref 13.5–17.5)
Neutrophils Absolute: 4.03
Platelets: 97 10*3/uL — AB (ref 150–400)
WBC: 6.3

## 2021-12-22 LAB — HEPATIC FUNCTION PANEL
ALT: 22 U/L (ref 10–40)
AST: 34 (ref 14–40)
Alkaline Phosphatase: 53 (ref 25–125)
Bilirubin, Total: 0.5

## 2021-12-22 LAB — BASIC METABOLIC PANEL
BUN: 27 — AB (ref 4–21)
CO2: 30 — AB (ref 13–22)
Chloride: 107 (ref 99–108)
Creatinine: 1.5 — AB (ref 0.6–1.3)
Glucose: 115
Potassium: 4.6 mEq/L (ref 3.5–5.1)
Sodium: 142 (ref 137–147)

## 2021-12-22 LAB — CBC: RBC: 3.19 — AB (ref 3.87–5.11)

## 2021-12-22 LAB — COMPREHENSIVE METABOLIC PANEL
Albumin: 3.6 (ref 3.5–5.0)
Calcium: 8.6 — AB (ref 8.7–10.7)

## 2021-12-25 ENCOUNTER — Inpatient Hospital Stay: Payer: Medicare Other | Attending: Oncology

## 2021-12-25 VITALS — BP 131/63 | HR 84 | Temp 98.1°F | Resp 18 | Ht 74.0 in | Wt 233.0 lb

## 2021-12-25 DIAGNOSIS — C240 Malignant neoplasm of extrahepatic bile duct: Secondary | ICD-10-CM

## 2021-12-25 DIAGNOSIS — C786 Secondary malignant neoplasm of retroperitoneum and peritoneum: Secondary | ICD-10-CM | POA: Diagnosis not present

## 2021-12-25 DIAGNOSIS — Z5111 Encounter for antineoplastic chemotherapy: Secondary | ICD-10-CM | POA: Insufficient documentation

## 2021-12-25 DIAGNOSIS — C221 Intrahepatic bile duct carcinoma: Secondary | ICD-10-CM | POA: Insufficient documentation

## 2021-12-25 DIAGNOSIS — C787 Secondary malignant neoplasm of liver and intrahepatic bile duct: Secondary | ICD-10-CM | POA: Insufficient documentation

## 2021-12-25 MED ORDER — SODIUM CHLORIDE 0.9 % IV SOLN: Freq: Once | INTRAVENOUS | Status: AC

## 2021-12-25 MED ORDER — SODIUM CHLORIDE 0.9 % IV SOLN
750.0000 mg/m2 | Freq: Once | INTRAVENOUS | Status: AC
  Administered 2021-12-25: 1862 mg via INTRAVENOUS
  Filled 2021-12-25: qty 48.97

## 2021-12-25 MED ORDER — SODIUM CHLORIDE 0.9 % IV SOLN
10.0000 mg | Freq: Once | INTRAVENOUS | Status: AC
  Administered 2021-12-25: 10 mg via INTRAVENOUS
  Filled 2021-12-25: qty 10

## 2021-12-25 MED ORDER — SODIUM CHLORIDE 0.9% FLUSH
10.0000 mL | INTRAVENOUS | Status: DC | PRN
  Administered 2021-12-25: 10 mL

## 2021-12-25 NOTE — Patient Instructions (Signed)
Gemcitabine injection ?What is this medication? ?GEMCITABINE (jem SYE ta been) is a chemotherapy drug. This medicine is used to treat many types of cancer like breast cancer, lung cancer, pancreatic cancer, and ovarian cancer. ?This medicine may be used for other purposes; ask your health care provider or pharmacist if you have questions. ?COMMON BRAND NAME(S): Gemzar, Infugem ?What should I tell my care team before I take this medication? ?They need to know if you have any of these conditions: ?blood disorders ?infection ?kidney disease ?liver disease ?lung or breathing disease, like asthma ?recent or ongoing radiation therapy ?an unusual or allergic reaction to gemcitabine, other chemotherapy, other medicines, foods, dyes, or preservatives ?pregnant or trying to get pregnant ?breast-feeding ?How should I use this medication? ?This drug is given as an infusion into a vein. It is administered in a hospital or clinic by a specially trained health care professional. ?Talk to your pediatrician regarding the use of this medicine in children. Special care may be needed. ?Overdosage: If you think you have taken too much of this medicine contact a poison control center or emergency room at once. ?NOTE: This medicine is only for you. Do not share this medicine with others. ?What if I miss a dose? ?It is important not to miss your dose. Call your doctor or health care professional if you are unable to keep an appointment. ?What may interact with this medication? ?medicines to increase blood counts like filgrastim, pegfilgrastim, sargramostim ?some other chemotherapy drugs like cisplatin ?vaccines ?Talk to your doctor or health care professional before taking any of these medicines: ?acetaminophen ?aspirin ?ibuprofen ?ketoprofen ?naproxen ?This list may not describe all possible interactions. Give your health care provider a list of all the medicines, herbs, non-prescription drugs, or dietary supplements you use. Also tell  them if you smoke, drink alcohol, or use illegal drugs. Some items may interact with your medicine. ?What should I watch for while using this medication? ?Visit your doctor for checks on your progress. This drug may make you feel generally unwell. This is not uncommon, as chemotherapy can affect healthy cells as well as cancer cells. Report any side effects. Continue your course of treatment even though you feel ill unless your doctor tells you to stop. ?In some cases, you may be given additional medicines to help with side effects. Follow all directions for their use. ?Call your doctor or health care professional for advice if you get a fever, chills or sore throat, or other symptoms of a cold or flu. Do not treat yourself. This drug decreases your body's ability to fight infections. Try to avoid being around people who are sick. ?This medicine may increase your risk to bruise or bleed. Call your doctor or health care professional if you notice any unusual bleeding. ?Be careful brushing and flossing your teeth or using a toothpick because you may get an infection or bleed more easily. If you have any dental work done, tell your dentist you are receiving this medicine. ?Avoid taking products that contain aspirin, acetaminophen, ibuprofen, naproxen, or ketoprofen unless instructed by your doctor. These medicines may hide a fever. ?Do not become pregnant while taking this medicine or for 6 months after stopping it. Women should inform their doctor if they wish to become pregnant or think they might be pregnant. Men should not father a child while taking this medicine and for 3 months after stopping it. There is a potential for serious side effects to an unborn child. Talk to your health care professional   or pharmacist for more information. Do not breast-feed an infant while taking this medicine or for at least 1 week after stopping it. ?Men should inform their doctors if they wish to father a child. This medicine may  lower sperm counts. Talk with your doctor or health care professional if you are concerned about your fertility. ?What side effects may I notice from receiving this medication? ?Side effects that you should report to your doctor or health care professional as soon as possible: ?allergic reactions like skin rash, itching or hives, swelling of the face, lips, or tongue ?breathing problems ?pain, redness, or irritation at site where injected ?signs and symptoms of a dangerous change in heartbeat or heart rhythm like chest pain; dizziness; fast or irregular heartbeat; palpitations; feeling faint or lightheaded, falls; breathing problems ?signs of decreased platelets or bleeding - bruising, pinpoint red spots on the skin, black, tarry stools, blood in the urine ?signs of decreased red blood cells - unusually weak or tired, feeling faint or lightheaded, falls ?signs of infection - fever or chills, cough, sore throat, pain or difficulty passing urine ?signs and symptoms of kidney injury like trouble passing urine or change in the amount of urine ?signs and symptoms of liver injury like dark yellow or brown urine; general ill feeling or flu-like symptoms; light-colored stools; loss of appetite; nausea; right upper belly pain; unusually weak or tired; yellowing of the eyes or skin ?swelling of ankles, feet, hands ?Side effects that usually do not require medical attention (report to your doctor or health care professional if they continue or are bothersome): ?constipation ?diarrhea ?hair loss ?loss of appetite ?nausea ?rash ?vomiting ?This list may not describe all possible side effects. Call your doctor for medical advice about side effects. You may report side effects to FDA at 1-800-FDA-1088. ?Where should I keep my medication? ?This drug is given in a hospital or clinic and will not be stored at home. ?NOTE: This sheet is a summary. It may not cover all possible information. If you have questions about this medicine,  talk to your doctor, pharmacist, or health care provider. ?? 2022 Elsevier/Gold Standard (2017-12-04 00:00:00) ? ?

## 2021-12-26 ENCOUNTER — Encounter: Payer: Self-pay | Admitting: Oncology

## 2021-12-27 ENCOUNTER — Inpatient Hospital Stay: Payer: Medicare Other

## 2021-12-27 DIAGNOSIS — C240 Malignant neoplasm of extrahepatic bile duct: Secondary | ICD-10-CM

## 2021-12-27 LAB — COMPREHENSIVE METABOLIC PANEL
Albumin: 3.7 (ref 3.5–5.0)
Calcium: 8.5 — AB (ref 8.7–10.7)

## 2021-12-27 LAB — BASIC METABOLIC PANEL
BUN: 39 — AB (ref 4–21)
CO2: 26 — AB (ref 13–22)
Chloride: 108 (ref 99–108)
Creatinine: 1.5 — AB (ref 0.6–1.3)
Glucose: 98
Potassium: 4.4 mEq/L (ref 3.5–5.1)
Sodium: 142 (ref 137–147)

## 2021-12-27 LAB — HEPATIC FUNCTION PANEL
ALT: 23 U/L (ref 10–40)
AST: 41 — AB (ref 14–40)
Alkaline Phosphatase: 51 (ref 25–125)
Bilirubin, Total: 0.8

## 2021-12-27 LAB — CBC: RBC: 3.15 — AB (ref 3.87–5.11)

## 2021-12-27 LAB — CBC AND DIFFERENTIAL
HCT: 31 — AB (ref 41–53)
Hemoglobin: 9.8 — AB (ref 13.5–17.5)
Neutrophils Absolute: 6.01
Platelets: 131 10*3/uL — AB (ref 150–400)
WBC: 7.8

## 2021-12-28 ENCOUNTER — Other Ambulatory Visit: Payer: Medicare Other

## 2021-12-28 ENCOUNTER — Encounter: Payer: Self-pay | Admitting: Oncology

## 2021-12-28 NOTE — Progress Notes (Signed)
?[facility]  ?[address] ?[place],  [place]  [address] ?[phone] ? ?Clinic Day: 11/17/2021 ? ?Referring physician: Bonnita Nasuti, MD ? ?HISTORY OF PRESENT ILLNESS:  ?The patient is a 81 y.o. 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  He comes in today to go over his CT scans to ascertain his new disease baseline after 16 cycles of chemotherapy,  which now consists of maintenance gemcitabine.  Cisplatin was discontinued after 8 cycles.  The patient claims to have tolerated his last cycle of treatment fairly well.  He denies having any new symptoms or findings which are concerning for overt signs of disease progression. ? ?PHYSICAL EXAM:  ?Blood pressure 131/64, pulse 74, temperature 98.5 ?F (36.9 ?C), resp. rate 16, height $RemoveBe'6\' 2"'aApghSLNx$  (1.88 m), weight 237 lb 6.4 oz (107.7 kg), SpO2 91 %. ? ?Wt Readings from Last 3 Encounters:  ?12/29/21 237 lb 6.4 oz (107.7 kg)  ?12/25/21 233 lb (105.7 kg)  ?12/11/21 236 lb 12 oz (107.4 kg)  ? ?Body mass index is 30.48 kg/m?Marland Kitchen ?Performance status (ECOG): 1 - Symptomatic but completely ambulatory ?Physical Exam ?Constitutional:   ?   General: He is not in acute distress. ?   Appearance: Normal appearance. He is normal weight.  ?HENT:  ?   Head: Normocephalic and atraumatic.  ?Eyes:  ?   General: No scleral icterus. ?   Extraocular Movements: Extraocular movements intact.  ?   Conjunctiva/sclera: Conjunctivae normal.  ?   Pupils: Pupils are equal, round, and reactive to light.  ?Cardiovascular:  ?   Rate and Rhythm: Normal rate and regular rhythm.  ?   Pulses: Normal pulses.  ?   Heart sounds: Normal heart sounds. No murmur heard. ?  No friction rub. No gallop.  ?Pulmonary:  ?   Effort: Pulmonary effort is normal. No respiratory distress.  ?   Breath sounds: Normal breath sounds.  ?Abdominal:  ?   General: Bowel sounds are normal.  There is no distension.  ?   Palpations: Abdomen is soft. There is no hepatomegaly, splenomegaly or mass.  ?   Tenderness: There is no abdominal tenderness.  ?Musculoskeletal:     ?   General: Normal range of motion.  ?   Cervical back: Normal range of motion and neck supple.  ?   Right lower leg: No edema.  ?   Left lower leg: No edema.  ?Lymphadenopathy:  ?   Cervical: No cervical adenopathy.  ?Skin: ?   General: Skin is warm and dry.  ?Neurological:  ?   General: No focal deficit present.  ?   Mental Status: He is alert and oriented to person, place, and time. Mental status is at baseline.  ?Psychiatric:     ?   Mood and Affect: Mood normal.     ?   Behavior: Behavior normal.     ?   Thought Content: Thought content normal.     ?   Judgment: Judgment normal.  ? ?SCANS:  The MRI of his abdomen revealed the following: ? ? FINDINGS:  ?Lower chest: Probable fibrotic scarring of the right lung base  ?(series 6, image 8).  ?Hepatobiliary: Status post left hepatectomy. No significant change  ?in clustered, nodular contrast enhancing metastatic disease of  ?anterior hepatic segment IV A,, with underlying intrinsic T2  ?hyperintensity and diffusion restriction, measuring in total 5.2 x  ?2.6 cm (  series 6, image 21, series 1602, image 58). Additional  ?unchanged T2 hyperintense, diffusion restricting lesion of the  ?posterior right lobe of the liver, hepatic segment VII, measuring  ?0.7 cm (series 6, image 16) and of the posterior liver dome, hepatic  ?segment VII, measuring 0.8 cm (series 6, image 10).. Status post  ?cholecystectomy. No biliary ductal dilatation.  ?Pancreas: No mass, inflammatory changes, or other parenchymal  ?abnormality identified.No pancreatic ductal dilatation.  ?Spleen: Within normal limits in size and appearance.  ?Adrenals/Urinary Tract: Normal adrenal glands. No renal masses or  ?suspicious contrast enhancement identified. No evidence of  ?hydronephrosis.  ?Stomach/Bowel: There is a contrast  enhancing, heterogeneous mass  ?adjacent to the distal descending colon, which is generally  ?incompletely imaged and best seen on coronal sequences, measuring  ?6.8 x 3.3 cm (series 8, image 18, series 17, image 59). This has  ?slowly enlarged over multiple prior examinations.  ?Vascular/Lymphatic: No pathologically enlarged lymph nodes  ?identified. No abdominal aortic aneurysm demonstrated.  ?Other: None.  ?Musculoskeletal: No suspicious osseous lesions identified.  ? ?IMPRESSION:  ?1. Status post left hepatectomy. No significant change in multiple  ?liver metastases, the largest of which are contrast enhancing. No  ?new liver lesions.  ?2. Contrast enhancing mass adjacent to the distal descending colon,  ?now measuring 6.8 x 3.3 cm, and slowly enlarged over multiple prior  ?examinations. Although generally concerning for malignancy,  ?extrahepatic metastases are unusual in cholangiocarcinoma. Consider  ?dedicated CT imaging of the abdomen and pelvis for further  ?assessment.  ?3. Probable fibrotic scarring of the right lung base, which could be  ?further evaluated by CT examination of the chest if desired.  ? ?LABS:  ? ? Latest Reference Range & Units 12/27/21 00:00  ?Sodium 137 - 147  142 (E)  ?Potassium 3.5 - 5.1 mEq/L 4.4 (E)  ?Chloride 99 - 108  108 (E)  ?CO2 13 - 22  26 ! (E)  ?Glucose  98 (E)  ?BUN 4 - 21  39 ! (E)  ?Creatinine 0.6 - 1.3  1.5 ! (E)  ?Calcium 8.7 - 10.7  8.5 ! (E)  ?Alkaline Phosphatase 25 - 125  51 (E)  ?Albumin 3.5 - 5.0  3.7 (E)  ?AST 14 - 40  41 ! (E)  ?ALT 10 - 40 U/L 23 (E)  ?Bilirubin, Total  0.8 (E)  ?CBC W DIFFERENTIAL ([facility] SCANNED REPORT)  Rpt  ?WBC  7.8 (E)  ?RBC 3.87 - 5.11  3.15 ! (E)  ?Hemoglobin 13.5 - 17.5  9.8 ! (E)  ?HCT 41 - 53  31 ! (E)  ?Platelets 150 - 400 K/uL 131 ! (E)  ?!: Data is abnormal ?(E): External lab result ?Rpt: View report in Results Review for more information ? ?ASSESSMENT & PLAN:  ?Assessment/Plan:  A 81 y.o. male with metastatic FGFR2 mutation  positive cholangiocarcinoma.  In clinic today, I went over all of his MRI images with him, which he could see that his disease appears to be holding stable on his maintenance gemcitabine.  The only new issue seen per scans was the unspecified mass adjacent to his descending colon.   On physical exam today, this gentleman does have an easily palpable mass in his left lower quadrant/left flank which he claims he has had for 30 years.  This lesion appears to correspond to the lesion seen on his MRI.  However, scans done in prior years never completely elucidated such a lesion.  I will have this area biopsied  to determine its etiology.  For now, he will proceed with his 16th cycle of maintenance gemcitabine, which he is receiving every 2 weeks.  I will see him back in 4 weeks to go over his biopsy results, as well as to see how he is doing before potentially heading into his 17th cycle of maintenance gemcitabine.  The patient understands all the plans discussed today and is in agreement with them.   ? ?Gary Craun Macarthur Critchley, MD ? ? ? ?  ?

## 2021-12-29 ENCOUNTER — Inpatient Hospital Stay (INDEPENDENT_AMBULATORY_CARE_PROVIDER_SITE_OTHER): Payer: Medicare Other | Admitting: Oncology

## 2021-12-29 VITALS — BP 131/64 | HR 74 | Temp 98.5°F | Resp 16 | Ht 74.0 in | Wt 237.4 lb

## 2021-12-29 DIAGNOSIS — C240 Malignant neoplasm of extrahepatic bile duct: Secondary | ICD-10-CM

## 2022-01-02 ENCOUNTER — Other Ambulatory Visit: Payer: Self-pay | Admitting: Oncology

## 2022-01-03 ENCOUNTER — Encounter: Payer: Self-pay | Admitting: Oncology

## 2022-01-05 ENCOUNTER — Inpatient Hospital Stay: Payer: Medicare Other

## 2022-01-05 DIAGNOSIS — R6889 Other general symptoms and signs: Secondary | ICD-10-CM

## 2022-01-05 LAB — HEPATIC FUNCTION PANEL
ALT: 20 U/L (ref 10–40)
AST: 31 (ref 14–40)
Alkaline Phosphatase: 63 (ref 25–125)
Bilirubin, Total: 0.7

## 2022-01-05 LAB — BASIC METABOLIC PANEL
BUN: 20 (ref 4–21)
CO2: 26 — AB (ref 13–22)
Chloride: 105 (ref 99–108)
Creatinine: 1.5 — AB (ref 0.6–1.3)
Glucose: 130
Potassium: 4.2 mEq/L (ref 3.5–5.1)
Sodium: 141 (ref 137–147)

## 2022-01-05 LAB — COMPREHENSIVE METABOLIC PANEL
Albumin: 3.9 (ref 3.5–5.0)
Calcium: 8.8 (ref 8.7–10.7)

## 2022-01-05 LAB — CBC AND DIFFERENTIAL
HCT: 30 — AB (ref 41–53)
Hemoglobin: 10 — AB (ref 13.5–17.5)
Neutrophils Absolute: 4.09
Platelets: 115 10*3/uL — AB (ref 150–400)
WBC: 6.1

## 2022-01-05 LAB — CBC: RBC: 3.13 — AB (ref 3.87–5.11)

## 2022-01-08 ENCOUNTER — Inpatient Hospital Stay: Payer: Medicare Other

## 2022-01-08 ENCOUNTER — Other Ambulatory Visit: Payer: Self-pay

## 2022-01-08 VITALS — BP 130/53 | HR 88 | Temp 98.3°F | Resp 18 | Wt 241.5 lb

## 2022-01-08 DIAGNOSIS — Z5111 Encounter for antineoplastic chemotherapy: Secondary | ICD-10-CM | POA: Diagnosis not present

## 2022-01-08 DIAGNOSIS — C240 Malignant neoplasm of extrahepatic bile duct: Secondary | ICD-10-CM

## 2022-01-08 MED ORDER — SODIUM CHLORIDE 0.9 % IV SOLN: Freq: Once | INTRAVENOUS | Status: DC

## 2022-01-08 MED ORDER — SODIUM CHLORIDE 0.9 % IV SOLN: Freq: Once | INTRAVENOUS | Status: AC

## 2022-01-08 MED ORDER — SODIUM CHLORIDE 0.9 % IV SOLN
10.0000 mg | Freq: Once | INTRAVENOUS | Status: AC
  Administered 2022-01-08: 10 mg via INTRAVENOUS
  Filled 2022-01-08: qty 1

## 2022-01-08 MED ORDER — SODIUM CHLORIDE 0.9% FLUSH
10.0000 mL | INTRAVENOUS | Status: DC | PRN
  Administered 2022-01-08: 10 mL

## 2022-01-08 MED ORDER — SODIUM CHLORIDE 0.9 % IV SOLN
750.0000 mg/m2 | Freq: Once | INTRAVENOUS | Status: AC
  Administered 2022-01-08: 1862 mg via INTRAVENOUS
  Filled 2022-01-08: qty 48.97

## 2022-01-08 NOTE — Progress Notes (Signed)
1131:PT STABLE AT TIME OF DISCHARGE ?

## 2022-01-08 NOTE — Patient Instructions (Signed)
[facility]  Discharge Instructions: ?Thank you for choosing [facility] to provide your oncology and hematology care.  ?If you have a lab appointment with the Cancer Center, please go directly to the Cancer Center and check in at the registration area. ?  ?Wear comfortable clothing and clothing appropriate for easy access to any Portacath or PICC line.  ? ?We strive to give you quality time with your provider. You may need to reschedule your appointment if you arrive late (15 or more minutes).  Arriving late affects you and other patients whose appointments are after yours.  Also, if you miss three or more appointments without notifying the office, you may be dismissed from the clinic at the provider?s discretion.    ?  ?For prescription refill requests, have your pharmacy contact our office and allow 72 hours for refills to be completed.   ? ?Today you received the following chemotherapy and/or immunotherapy agents Gemcitabine    ?  ?To help prevent nausea and vomiting after your treatment, we encourage you to take your nausea medication as directed. ? ?BELOW ARE SYMPTOMS THAT SHOULD BE REPORTED IMMEDIATELY: ?*FEVER GREATER THAN 100.4 F (38 ?C) OR HIGHER ?*CHILLS OR SWEATING ?*NAUSEA AND VOMITING THAT IS NOT CONTROLLED WITH YOUR NAUSEA MEDICATION ?*UNUSUAL SHORTNESS OF BREATH ?*UNUSUAL BRUISING OR BLEEDING ?*URINARY PROBLEMS (pain or burning when urinating, or frequent urination) ?*BOWEL PROBLEMS (unusual diarrhea, constipation, pain near the anus) ?TENDERNESS IN MOUTH AND THROAT WITH OR WITHOUT PRESENCE OF ULCERS (sore throat, sores in mouth, or a toothache) ?UNUSUAL RASH, SWELLING OR PAIN  ?UNUSUAL VAGINAL DISCHARGE OR ITCHING  ? ?Items with * indicate a potential emergency and should be followed up as soon as possible or go to the Emergency Department if any problems should occur. ? ?Please show the CHEMOTHERAPY ALERT CARD or IMMUNOTHERAPY ALERT CARD at check-in to the  Emergency Department and triage nurse. ? ?Should you have questions after your visit or need to cancel or reschedule your appointment, please contact [facility]  Dept: [phone]  and follow the prompts.  Office hours are 8:00 a.m. to 4:30 p.m. Monday - Friday. Please note that voicemails left after 4:00 p.m. may not be returned until the following business day.  We are closed weekends and major holidays. You have access to a nurse at all times for urgent questions. Please call the main number to the clinic Dept: [phone] and follow the prompts. ? ?For any non-urgent questions, you may also contact your provider using MyChart. We now offer e-Visits for anyone 18 and older to request care online for non-urgent symptoms. For details visit [email or web address]. ?  ?Also download the MyChart app! Go to the app store, search "MyChart", open the app, select [facility], and log in with your MyChart username and password. ? ?Due to Covid, a mask is required upon entering the hospital/clinic. If you do not have a mask, one will be given to you upon arrival. For doctor visits, patients may have 1 support person aged 18 or older with them. For treatment visits, patients cannot have anyone with them due to current Covid guidelines and our immunocompromised population.  ? ? ?

## 2022-01-09 LAB — HM DIABETES EYE EXAM

## 2022-01-11 ENCOUNTER — Encounter: Payer: Self-pay | Admitting: Oncology

## 2022-01-11 ENCOUNTER — Other Ambulatory Visit: Payer: Self-pay | Admitting: Oncology

## 2022-01-19 ENCOUNTER — Other Ambulatory Visit: Payer: Self-pay | Admitting: Hematology and Oncology

## 2022-01-19 ENCOUNTER — Inpatient Hospital Stay: Payer: Medicare Other

## 2022-01-19 ENCOUNTER — Encounter: Payer: Self-pay | Admitting: Oncology

## 2022-01-19 DIAGNOSIS — R6889 Other general symptoms and signs: Secondary | ICD-10-CM

## 2022-01-19 LAB — CBC
MCV: 98 — AB (ref 80–94)
RBC: 2.99 — AB (ref 3.87–5.11)

## 2022-01-19 LAB — BASIC METABOLIC PANEL
BUN: 22 — AB (ref 4–21)
CO2: 26 — AB (ref 13–22)
Chloride: 105 (ref 99–108)
Creatinine: 1.5 — AB (ref 0.6–1.3)
Glucose: 123
Potassium: 4.3 mEq/L (ref 3.5–5.1)
Sodium: 140 (ref 137–147)

## 2022-01-19 LAB — CBC AND DIFFERENTIAL
HCT: 29 — AB (ref 41–53)
Hemoglobin: 9.4 — AB (ref 13.5–17.5)
Neutrophils Absolute: 4.15
Platelets: 111 10*3/uL — AB (ref 150–400)
WBC: 6.1

## 2022-01-19 LAB — HEPATIC FUNCTION PANEL
ALT: 19 U/L (ref 10–40)
AST: 30 (ref 14–40)
Alkaline Phosphatase: 49 (ref 25–125)
Bilirubin, Total: 0.7

## 2022-01-19 LAB — COMPREHENSIVE METABOLIC PANEL
Albumin: 3.8 (ref 3.5–5.0)
Calcium: 8.8 (ref 8.7–10.7)

## 2022-01-21 NOTE — Progress Notes (Signed)
?[facility]  ?[address] ?[place],  [place]  [address] ?[phone] ? ?Clinic Day: 11/17/2021 ? ?Referring physician: Bonnita Nasuti, MD ? ?HISTORY OF PRESENT ILLNESS:  ?The patient is a 81 y.o. 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  He comes in today to go over his abdominal biopsy to determine if he has a new site of metastatic disease.  Since his last visit, the patient has been doing okay.  He still has occasional episodes of being lightheaded.  However, he denies having any obvious new symptoms of POTS which concern him for overt signs of disease progression.. ? ?PHYSICAL EXAM:  ?Blood pressure (!) 122/58, pulse 68, temperature 98.5 ?F (36.9 ?C), resp. rate 18, height $RemoveBe'6\' 2"'JCgyuvRKC$  (1.88 m), weight 234 lb 11.2 oz (106.5 kg), SpO2 98 %. ? ?Wt Readings from Last 3 Encounters:  ?01/22/22 234 lb 11.2 oz (106.5 kg)  ?01/08/22 241 lb 8 oz (109.5 kg)  ?12/29/21 237 lb 6.4 oz (107.7 kg)  ? ?Body mass index is 30.13 kg/m?Marland Kitchen ?Performance status (ECOG): 1 - Symptomatic but completely ambulatory ?Physical Exam ?Constitutional:   ?   General: He is not in acute distress. ?   Appearance: Normal appearance. He is normal weight.  ?HENT:  ?   Head: Normocephalic and atraumatic.  ?Eyes:  ?   General: No scleral icterus. ?   Extraocular Movements: Extraocular movements intact.  ?   Conjunctiva/sclera: Conjunctivae normal.  ?   Pupils: Pupils are equal, round, and reactive to light.  ?Cardiovascular:  ?   Rate and Rhythm: Normal rate and regular rhythm.  ?   Pulses: Normal pulses.  ?   Heart sounds: Normal heart sounds. No murmur heard. ?  No friction rub. No gallop.  ?Pulmonary:  ?   Effort: Pulmonary effort is normal. No respiratory distress.  ?   Breath sounds: Normal breath sounds.  ?Abdominal:  ?   General: Bowel sounds are normal. There is no distension.  ?   Palpations:  Abdomen is soft. There is no hepatomegaly, splenomegaly or mass.  ?   Tenderness: There is no abdominal tenderness.  ?Musculoskeletal:     ?   General: Normal range of motion.  ?   Cervical back: Normal range of motion and neck supple.  ?   Right lower leg: No edema.  ?   Left lower leg: No edema.  ?Lymphadenopathy:  ?   Cervical: No cervical adenopathy.  ?Skin: ?   General: Skin is warm and dry.  ?Neurological:  ?   General: No focal deficit present.  ?   Mental Status: He is alert and oriented to person, place, and time. Mental status is at baseline.  ?Psychiatric:     ?   Mood and Affect: Mood normal.     ?   Behavior: Behavior normal.     ?   Thought Content: Thought content normal.     ?   Judgment: Judgment normal.  ? ?PATHOLOGY:  His abdominal mass biopsy revealed the following: ? ? ?ASSESSMENT & PLAN:  ?Assessment/Plan:  A 81 y.o. male with metastatic FGFR2 mutation positive cholangiocarcinoma.  In clinic today, I went over all of his recent biopsy results with him, which further solidifies that this gentleman does have new disease within his peritoneum consistent with his metastatic cholangiocarcinoma.  This gentleman has already received 16 cycles of gemcitabine-based chemotherapy, with the  first 8 cycles also consisting of cisplatin.  As he does have an FGFR alteration/mutation, I will place him on pemigatinib, which is an FGFR inhibitor.  This medicine will be given at 13.5 mg daily, every 2/3 weeks.  My only concern with this agent is the possibility that it can cause eye damage, particularly in the retinas.  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  This medication usually requires routine optical coherence tomography (OCT) testing to be done to ensure it is not leading to further damage.  This patient recently saw an optometrist.  Our office will call that office to see if OCT testing is done there.  They will also be inquired as  to whether his retinal damage in his left would preclude him from even being considered for any form of FGFR inhibitor therapy.  I will see this patient back in 3 weeks.  Hopefully, by then, I will know whether he would be considered a decent enough candidate for oral pemigatinib.  The patient understands all the plans discussed today and is in agreement with them.   ? ?Zyra Parrillo Macarthur Critchley, MD ? ? ? ?  ?

## 2022-01-22 ENCOUNTER — Inpatient Hospital Stay: Payer: Medicare Other

## 2022-01-22 ENCOUNTER — Other Ambulatory Visit: Payer: Self-pay

## 2022-01-22 ENCOUNTER — Inpatient Hospital Stay: Payer: Medicare Other | Attending: Oncology | Admitting: Oncology

## 2022-01-22 VITALS — BP 122/58 | HR 68 | Temp 98.5°F | Resp 18 | Ht 74.0 in | Wt 234.7 lb

## 2022-01-22 DIAGNOSIS — C240 Malignant neoplasm of extrahepatic bile duct: Secondary | ICD-10-CM

## 2022-01-24 ENCOUNTER — Other Ambulatory Visit: Payer: Self-pay | Admitting: Hematology and Oncology

## 2022-01-24 DIAGNOSIS — T451X5A Adverse effect of antineoplastic and immunosuppressive drugs, initial encounter: Secondary | ICD-10-CM

## 2022-01-25 ENCOUNTER — Other Ambulatory Visit (HOSPITAL_COMMUNITY): Payer: Self-pay

## 2022-01-25 ENCOUNTER — Telehealth: Payer: Self-pay | Admitting: Pharmacy Technician

## 2022-01-25 ENCOUNTER — Telehealth: Payer: Self-pay

## 2022-01-25 ENCOUNTER — Other Ambulatory Visit: Payer: Self-pay | Admitting: Oncology

## 2022-01-25 ENCOUNTER — Encounter: Payer: Self-pay | Admitting: Oncology

## 2022-01-25 MED ORDER — PEMIGATINIB 4.5 MG PO TABS: ORAL_TABLET | ORAL | 5 refills | Status: DC

## 2022-01-25 MED ORDER — PEMIGATINIB 4.5 MG PO TABS
ORAL_TABLET | ORAL | 5 refills | Status: DC
  Filled 2022-01-25: qty 42, fill #0

## 2022-01-25 NOTE — Telephone Encounter (Signed)
Received New start notification for  Pemazyre . Will update as we work through the benefits process.  ?

## 2022-01-25 NOTE — Telephone Encounter (Signed)
Lm for patient to return my call.  Dr. Bobby Rumpf spoke with Dr. Wynetta Emery [phone] @ pt's eye dr.  There is nothing with either of his Retina's that will prevent him from receiving his medication.  He will get monthly eye exams. ?

## 2022-01-26 ENCOUNTER — Other Ambulatory Visit (HOSPITAL_COMMUNITY): Payer: Self-pay

## 2022-01-26 ENCOUNTER — Ambulatory Visit: Payer: Medicare Other | Admitting: Oncology

## 2022-01-26 ENCOUNTER — Other Ambulatory Visit: Payer: Medicare Other

## 2022-01-26 ENCOUNTER — Telehealth: Payer: Self-pay

## 2022-01-26 NOTE — Telephone Encounter (Signed)
Only 1 copay grant available- total coverage is $4,300/ year. Emailed patient assistance application to office. Attempted to call patient, left message. ?

## 2022-01-26 NOTE — Telephone Encounter (Signed)
Ran test claim for the 13.'5mg'$  tablet- patient's copay would be $3,357.66 for 21 day supply ?

## 2022-01-26 NOTE — Telephone Encounter (Signed)
Oral Oncology Pharmacist Encounter ? ?Received new prescription for pemigatinib [facility]) for the treatment of metastatic cholangiocarcinoma with FGFR mutation, planned duration until disease progression or unacceptable toxicity. ? ?Labs from 01/19/22 assessed, no interventions needed. Patients creatinine elavated at 1.5 although dose reduction only needed when CrCl is less than 30.  Dosing and frequency assessed, will discuss with MD about switching to 13.'5mg'$  tabs to decrease pill burden.  ? ?Patient will receive monthly eye exams per MD. Recommended monitoring is every 2 months for first 6 months then every 3 months.  ? ?Current medication list in Epic reviewed, DDIs with Pemazyre identified: ?- omeprazole may decrease the concentration of pemazyre but no actions are needed. ? ?Evaluated chart and no patient barriers to medication adherence noted.  ? ?Patient agreement for treatment documented in MD note on 01/25/2022. ? ?Prescription has been e-scribed to the [facility] for benefits analysis and approval. ? ?Oral Oncology Clinic will continue to follow for insurance authorization, copayment issues, initial counseling and start date. ? ?Drema Halon, PharmD ?Hematology/Oncology Clinical Pharmacist ?[facility] ?[phone] ?01/26/2022 8:36 AM ? ?

## 2022-01-26 NOTE — Telephone Encounter (Addendum)
Oral Oncology Patient Advocate Encounter ? ?Prior Authorization for [employer] 4.'5mg'$  has been approved.   ? ?PA# (Key: [id number]) - [id number] ?Effective dates: 01/26/22 through 09/23/22 ? ?Patients co-pay is $5,287.41. Patient has deductible and in the coverage gap. Will initiate Patient Assistance. ? ?Oral Oncology Clinic will continue to follow.  ? ?

## 2022-01-26 NOTE — Telephone Encounter (Signed)
Submitted a Prior Authorization request to [facility] for  Pemazyre 4.'5mg'$   via CoverMyMeds. Will update once we receive a response. ? ? ?KeyLance Coon - PA Case ID: [id number] ? ?

## 2022-01-31 NOTE — Progress Notes (Signed)
Sent in application for free Pemazyre to [employer] ?

## 2022-01-31 NOTE — Telephone Encounter (Signed)
Oral Chemotherapy Pharmacist Encounter ? ?I spoke with patient for overview of new oral chemotherapy medication: Pemazyre (pemigatinib) for the treatment of metastatic, previously treated cholangiocarcinoma with FGFR2 fusion, planned duration until disease progression or unacceptable toxicity.  ? ?Counseled patient on administration, dosing, side effects, monitoring, drug-food interactions, safe handling, storage, and disposal. ? ?Patient will take Pemazyre 4.$RemoveBeforeD'5mg'jKdyqpUGCFwWyC$  tablets, 3 tablets by mouth once daily, with or without food, taken for 14 days on, 7 days off, repeated every 21 days. ? ?Patient knows to avoid grapefruit and grapefruit juice while on treatment with Pemazyre. ? ?Pemazyre start date: pending patient assistance application ? ?Side effects include but not limited to: hyperphosphatemia, alopecia, diarrhea, nausea, constipation, stomatitis, taste changes, decreased appetite, hand-foot skin reaction, nail toxicity, dry eye, dry mouth, fatigue, abdominal pain, and back pain. ?Rare, but serious, adverse event of retinal pigment epithelial detachment discussed with patient.   ? ?Patient will alert the office to any changes in vision that may occur. ?Patient has anti-emetic on hand and knows to take it if nausea occurs. ?Patient will obtain antidiarrheal and will alert the office of 4 or more stools above baseline. ? ?Reviewed with patient importance of keeping a medication schedule and plan for any missed doses. No barriers to medication adherence identified. ? ?Medication reconciliation performed and medication/allergy list updated. ? ?Ship broker for [employer] has been obtained. ? ?Patient informed the pharmacy will reach out 5-7 days prior to needing next fill of Pemazyre to coordinate continued medication acquisition to prevent break in therapy.  ? ?All questions answered. ? ?Mr. Millea voiced understanding and appreciation.  ? ?Medication education handout was given to patient in person. Patient  knows to call the office with questions or concerns. Oral Chemotherapy Clinic phone number provided to patient.  ? ?Drema Halon, PharmD ?Hematology/Oncology Clinical Pharmacist ?[facility] ?[phone] ?01/31/2022   1:25 PM ?

## 2022-02-01 NOTE — Telephone Encounter (Signed)
Patient's application was faxed in on 01/31/22 ?

## 2022-02-07 ENCOUNTER — Other Ambulatory Visit (HOSPITAL_COMMUNITY): Payer: Self-pay

## 2022-02-07 ENCOUNTER — Encounter: Payer: Self-pay | Admitting: Oncology

## 2022-02-07 NOTE — Telephone Encounter (Signed)
Patient has been approved for $4,300 copay grant through [employer]. Approved through 11/09/21 through 02/07/23. ?  ?Processing information: ? ? ?  ?Phone: [phone]  ?

## 2022-02-09 NOTE — Telephone Encounter (Signed)
Oral Chemotherapy Pharmacist Encounter   Patient will receive medication from [facility] on Monday, 02/12/2022 and will contact office upon starting.   Drema Halon, PharmD Hematology/Oncology Clinical Pharmacist [facility] [phone]

## 2022-02-11 NOTE — Progress Notes (Signed)
[facility]  [address] [place],  [place]  [address] [phone]  Clinic Day: 02/12/2022  Referring physician: Bonnita Nasuti, MD  HISTORY OF PRESENT ILLNESS:  The patient is a 81 y.o. 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  Due to scans showing disease progression, which was also proven per a peritoneal biopsy, the plan was for this patient to be switched to pemigatinib.  The patient has yet to receive this medication.  However, he brings to my attention that pharmacy was scheduled to mail this medicine out for him today.  Since his last visit, the patient has begun to have more abdominal pain.  Understandably, he is concerned about his disease being the potential cause behind this.    PHYSICAL EXAM:  Blood pressure 136/67, pulse 74, temperature 98.5 F (36.9 C), resp. rate 18, height 6' 2" (1.88 m), weight 238 lb 12.8 oz (108.3 kg), SpO2 92 %.  Wt Readings from Last 3 Encounters:  02/12/22 238 lb 12.8 oz (108.3 kg)  01/22/22 234 lb 11.2 oz (106.5 kg)  01/08/22 241 lb 8 oz (109.5 kg)   Body mass index is 30.66 kg/m. Performance status (ECOG): 1 - Symptomatic but completely ambulatory Physical Exam Constitutional:      General: He is not in acute distress.    Appearance: Normal appearance. He is normal weight.  HENT:     Head: Normocephalic and atraumatic.  Eyes:     General: No scleral icterus.    Extraocular Movements: Extraocular movements intact.     Conjunctiva/sclera: Conjunctivae normal.     Pupils: Pupils are equal, round, and reactive to light.  Cardiovascular:     Rate and Rhythm: Normal rate and regular rhythm.     Pulses: Normal pulses.     Heart sounds: Normal heart sounds. No murmur heard.   No friction rub. No gallop.  Pulmonary:     Effort: Pulmonary effort is normal. No respiratory distress.      Breath sounds: Normal breath sounds.  Abdominal:     General: Bowel sounds are normal. There is no distension.     Palpations: Abdomen is soft. There is no hepatomegaly, splenomegaly or mass.     Tenderness: There is abdominal tenderness.  Musculoskeletal:        General: Normal range of motion.     Cervical back: Normal range of motion and neck supple.     Right lower leg: No edema.     Left lower leg: No edema.  Lymphadenopathy:     Cervical: No cervical adenopathy.  Skin:    General: Skin is warm and dry.  Neurological:     General: No focal deficit present.     Mental Status: He is alert and oriented to person, place, and time. Mental status is at baseline.  Psychiatric:        Mood and Affect: Mood normal.        Behavior: Behavior normal.        Thought Content: Thought content normal.        Judgment: Judgment normal.    ASSESSMENT & PLAN:  Assessment/Plan:  A 81 y.o. male with metastatic FGFR2 mutation positive cholangiocarcinoma.  As mentioned previously, the patient is scheduled to receive his pemigatinib today.  This agent is an FGFR inhibitor that is specifically being used for him as his disease harbors this mutation.  He has  already been scheduled to undergo monthly optical coherence tomography (OCT) testing to ensure this agent does not cause any retinal damage.  With respect to his increased abdominal pain, I will have him undergo a CT scan today to ascertain his new disease baseline.  He knows to begin taking his pemigatinib immediately as his pain likely is related to some form of disease progression.  I will see him back in 3 weeks to review his scans and reassess him before he heads into his 2nd cycle of pemigatinib.  The patient understands all the plans discussed today and is in agreement with them.    Cairo Agostinelli Macarthur Critchley, MD

## 2022-02-12 ENCOUNTER — Inpatient Hospital Stay (INDEPENDENT_AMBULATORY_CARE_PROVIDER_SITE_OTHER): Payer: Medicare Other | Admitting: Oncology

## 2022-02-12 ENCOUNTER — Other Ambulatory Visit: Payer: Self-pay | Admitting: Oncology

## 2022-02-12 VITALS — BP 136/67 | HR 74 | Temp 98.5°F | Resp 18 | Ht 74.0 in | Wt 238.8 lb

## 2022-02-12 DIAGNOSIS — C240 Malignant neoplasm of extrahepatic bile duct: Secondary | ICD-10-CM

## 2022-02-13 ENCOUNTER — Telehealth: Payer: Self-pay | Admitting: Oncology

## 2022-02-13 ENCOUNTER — Encounter: Payer: Self-pay | Admitting: Oncology

## 2022-02-13 NOTE — Telephone Encounter (Signed)
02/13/22 spoke with patient and scheduled next appt.

## 2022-02-14 ENCOUNTER — Telehealth: Payer: Self-pay

## 2022-02-14 NOTE — Telephone Encounter (Signed)
I spoke with pt. He has received the [facility] and has taken 2 doses worth (2 days). He took it @ 8pm each night, mixed in yogurt. He denies any side effects so far.  Pt reminded to call us if he develops temp of 100.4 or higher, day or night. He verbalized understanding.

## 2022-02-18 ENCOUNTER — Encounter: Payer: Self-pay | Admitting: Oncology

## 2022-02-20 ENCOUNTER — Telehealth: Payer: Self-pay

## 2022-02-20 NOTE — Telephone Encounter (Addendum)
I spoke with pt. He states the only thing he has really noticed since starting the Pemazyre is intermittent nausea. He is using antiemetic w/relief. He takes the medication @ 12n, sometimes with food, other times without food. No missed doses. He denies emesis, mouth sores, diarrhea, lack of appetite, taste changes, and fevers. Pt states, "I'm in pretty good shape. I do have a little abdominal bloating. My eyes still causing me to see everything in 3D, but that was happening before I started this medicine". Pt admits he has unsteady gait @ times due to vision & balance issues. He has cane to use when needed. No recent falls. I reminded pt to call us if he develops temp of 100.4 or higher, day or night. He verbalized understanding. I confirmed his appt tomorrow here.

## 2022-02-21 ENCOUNTER — Other Ambulatory Visit: Payer: Self-pay

## 2022-02-22 ENCOUNTER — Encounter: Payer: Self-pay | Admitting: Oncology

## 2022-02-23 ENCOUNTER — Encounter: Payer: Self-pay | Admitting: Hematology and Oncology

## 2022-02-23 ENCOUNTER — Other Ambulatory Visit: Payer: Self-pay | Admitting: Oncology

## 2022-02-23 ENCOUNTER — Inpatient Hospital Stay: Payer: Medicare Other | Attending: Oncology | Admitting: Hematology and Oncology

## 2022-02-23 ENCOUNTER — Inpatient Hospital Stay: Payer: Medicare Other

## 2022-02-23 ENCOUNTER — Other Ambulatory Visit: Payer: Self-pay

## 2022-02-23 VITALS — BP 118/60 | HR 65 | Temp 98.6°F | Resp 20 | Ht 74.0 in | Wt 240.3 lb

## 2022-02-23 DIAGNOSIS — T451X5A Adverse effect of antineoplastic and immunosuppressive drugs, initial encounter: Secondary | ICD-10-CM

## 2022-02-23 DIAGNOSIS — D6481 Anemia due to antineoplastic chemotherapy: Secondary | ICD-10-CM

## 2022-02-23 DIAGNOSIS — C786 Secondary malignant neoplasm of retroperitoneum and peritoneum: Secondary | ICD-10-CM | POA: Insufficient documentation

## 2022-02-23 DIAGNOSIS — R6889 Other general symptoms and signs: Secondary | ICD-10-CM

## 2022-02-23 DIAGNOSIS — C221 Intrahepatic bile duct carcinoma: Secondary | ICD-10-CM | POA: Insufficient documentation

## 2022-02-23 LAB — BASIC METABOLIC PANEL
BUN: 24 — AB (ref 4–21)
CO2: 27 — AB (ref 13–22)
Chloride: 104 (ref 99–108)
Creatinine: 1.6 — AB (ref 0.6–1.3)
Glucose: 115
Potassium: 4.6 mEq/L (ref 3.5–5.1)
Sodium: 142 (ref 137–147)

## 2022-02-23 LAB — FOLATE: Folate: 32.2 ng/mL (ref >5.9)

## 2022-02-23 LAB — HEPATIC FUNCTION PANEL
ALT: 24 U/L (ref 10–40)
AST: 33 (ref 14–40)
Alkaline Phosphatase: 43 (ref 25–125)
Bilirubin, Total: 0.6

## 2022-02-23 LAB — CBC AND DIFFERENTIAL
HCT: 29 — AB (ref 41–53)
Hemoglobin: 9.2 — AB (ref 13.5–17.5)
Neutrophils Absolute: 3.89
Platelets: 116 10*3/uL — AB (ref 150–400)
WBC: 5.8

## 2022-02-23 LAB — CBC
MCV: 94 (ref 80–94)
RBC: 3.02 — AB (ref 3.87–5.11)

## 2022-02-23 LAB — IRON AND TIBC
Iron: 48 ug/dL (ref 45–182)
Saturation Ratios: 16 % — ABNORMAL LOW (ref 17.9–39.5)
TIBC: 293 ug/dL (ref 250–450)
UIBC: 245 ug/dL

## 2022-02-23 LAB — VITAMIN B12: Vitamin B-12: 481 pg/mL (ref 180–914)

## 2022-02-23 LAB — COMPREHENSIVE METABOLIC PANEL
Albumin: 3.8 (ref 3.5–5.0)
Calcium: 9.6 (ref 8.7–10.7)

## 2022-02-23 LAB — FERRITIN: Ferritin: 151 ng/mL (ref 24–336)

## 2022-02-23 NOTE — Progress Notes (Cosign Needed)
[facility]  [address] [place],    [address] [phone]  Clinic Day:  02/23/2022  Referring physician: Bonnita Nasuti, MD   HISTORY OF PRESENT ILLNESS:  The patient is a 81 y.o. 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  Due to biopsy-proven disease progression on gemcitabine chemotherapy, his treatment was switched to oral pemigatinib, which is an FGFR inhibitor.  The patient has completed his first week of therapy and is here today for repeat clinical assessment.  He denies further episodes of abdominal pain.  He reports fatigue and decreased appetite.  Report he reports nausea without vomiting for which medication is effective.  He also works dry mouth.  He denies diarrhea.  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  He states he will need surgery to fix this, which is planned in September.  He has poor vision due to this, as well as decreased vision in the right eye.  He denies vision changes.  PHYSICAL EXAM:  Blood pressure 118/60, pulse 65, temperature 98.6 F (37 C), temperature source Oral, resp. rate 20, height '6\' 2"'$  (1.88 m), weight 240 lb 4.8 oz (109 kg), SpO2 91 %. Wt Readings from Last 3 Encounters:  02/23/22 240 lb 4.8 oz (109 kg)  02/12/22 238 lb 12.8 oz (108.3 kg)  01/22/22 234 lb 11.2 oz (106.5 kg)   Body mass index is 30.85 kg/m.  Performance status (ECOG): 1 - Symptomatic but completely ambulatory  Physical Exam Vitals and nursing note reviewed.  Constitutional:      General: He is not in acute distress.    Appearance: Normal appearance. He is normal weight.  HENT:     Head: Normocephalic and atraumatic.     Mouth/Throat:     Mouth: Mucous membranes are moist.     Pharynx: Oropharynx is clear. No oropharyngeal exudate or posterior  oropharyngeal erythema.  Eyes:     General: No scleral icterus.    Extraocular Movements: Extraocular movements intact.     Conjunctiva/sclera: Conjunctivae normal.     Pupils: Pupils are equal, round, and reactive to light.  Cardiovascular:     Rate and Rhythm: Normal rate and regular rhythm.     Heart sounds: Normal heart sounds. No murmur heard.   No friction rub. No gallop.  Pulmonary:     Effort: Pulmonary effort is normal.     Breath sounds: Normal breath sounds. No wheezing, rhonchi or rales.  Abdominal:     General: Bowel sounds are normal. There is no distension.     Palpations: Abdomen is soft. There is no hepatomegaly, splenomegaly or mass.     Tenderness: There is no abdominal tenderness.     Comments: There is a persistent 5 cm subcutaneous mass of the lateral left lower abdomen.  Musculoskeletal:        General: Normal range of motion.     Cervical back: Normal range of motion and neck supple. No tenderness.     Right lower leg: No edema.     Left lower leg: No edema.  Lymphadenopathy:     Cervical: No cervical adenopathy.  Skin:    General: Skin is warm and dry.     Coloration: Skin is not jaundiced.     Findings: No rash.  Neurological:     Mental Status: He is  alert and oriented to person, place, and time.     Cranial Nerves: No cranial nerve deficit.  Psychiatric:        Mood and Affect: Mood normal.        Behavior: Behavior normal.        Thought Content: Thought content normal.    LABS:      Latest Ref Rng & Units 02/23/2022   12:00 AM 01/19/2022   12:00 AM 01/05/2022   12:00 AM  CBC  WBC  5.8      6.1      6.1       Hemoglobin 13.5 - 17.5 9.2      9.4      10.0       Hematocrit 41 - 53 '29      29      30       '$ Platelets 150 - 400 K/uL 116      111      115          This result is from an external source.      Latest Ref Rng & Units 02/23/2022   12:00 AM 01/19/2022   12:00 AM 01/05/2022   12:00 AM  CMP  BUN 4 - '21 24      22      20        '$ Creatinine 0.6 - 1.3 1.6      1.5      1.5       Sodium 137 - 147 142      140      141       Potassium 3.5 - 5.1 mEq/L 4.6      4.3      4.2       Chloride 99 - 108 104      105      105       CO2 13 - '22 27      26      26       '$ Calcium 8.7 - 10.7 9.6      8.8      8.8       Alkaline Phos 25 - 125 43      49      63       AST 14 - 40 33      30      31       ALT 10 - 40 U/L '24      19      20          '$ This result is from an external source.     No results found for: CEA1 / No results found for: CEA1 No results found for: PSA1 No results found for: FMB846 No results found for: CAN125  No results found for: TOTALPROTELP, ALBUMINELP, A1GS, A2GS, BETS, BETA2SER, GAMS, MSPIKE, SPEI Lab Results  Component Value Date   TIBC 307 08/10/2021   FERRITIN 220 08/10/2021   IRONPCTSAT 15 (L) 08/10/2021   No results found for: LDH  No results found for: AFPTUMOR, TOTALPROTELP, ALBUMINELP, A1GS, A2GS, BETS, BETA2SER, GAMS, MSPIKE, SPEI, LDH, CEA1, PSA1, IGASERUM, IGGSERUM, IGMSERUM, THGAB, THYROGLB  Review Flowsheet        Latest Ref Rng & Units 08/10/2021  Oncology Labs  Ferritin 24 - 336 ng/mL 220    %SAT 17.9 - 39.5 % 15  STUDIES:  No results found.    ASSESSMENT & PLAN:   Assessment/Plan:  A 81 y.o. 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  He is now being treated with pemigatinib and tolerating this fairly well.  He knows to complete his second week of therapy.  We will then see him back again on June 12 as previously scheduled.  The patient understands all the plans discussed today and is in agreement with them.      Marvia Pickles, PA-C

## 2022-02-27 ENCOUNTER — Encounter: Payer: Self-pay | Admitting: Oncology

## 2022-03-01 ENCOUNTER — Ambulatory Visit: Payer: Medicare Other

## 2022-03-04 NOTE — Progress Notes (Incomplete)
[facility]  [address] [place],  [place]  [address] [phone]  Clinic Day: 02/12/2022  Referring physician: Bonnita Nasuti, MD  HISTORY OF PRESENT ILLNESS:  The patient is a 81 y.o. 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  Due to scans showing disease progression, which was also proven per a peritoneal biopsy, the patient recently started pemigatinib.  He comes in today to be evaluated before heading into his 2nd cycle of therapy.  Unfortunately, since his last visit, the patient was hospitalized for urosepsis, which led to an extended period of an altered mental status.  PHYSICAL EXAM:  There were no vitals taken for this visit.  Wt Readings from Last 3 Encounters:  02/23/22 240 lb 4.8 oz (109 kg)  02/12/22 238 lb 12.8 oz (108.3 kg)  01/22/22 234 lb 11.2 oz (106.5 kg)   There is no height or weight on file to calculate BMI. Performance status (ECOG): 1 - Symptomatic but completely ambulatory Physical Exam Constitutional:      General: He is not in acute distress.    Appearance: Normal appearance. He is normal weight.  HENT:     Head: Normocephalic and atraumatic.  Eyes:     General: No scleral icterus.    Extraocular Movements: Extraocular movements intact.     Conjunctiva/sclera: Conjunctivae normal.     Pupils: Pupils are equal, round, and reactive to light.  Cardiovascular:     Rate and Rhythm: Normal rate and regular rhythm.     Pulses: Normal pulses.     Heart sounds: Normal heart sounds. No murmur heard.    No friction rub. No gallop.  Pulmonary:     Effort: Pulmonary effort is normal. No respiratory distress.     Breath sounds: Normal breath sounds.  Abdominal:     General: Bowel sounds are normal. There is no distension.     Palpations: Abdomen is soft. There is no hepatomegaly, splenomegaly or mass.      Tenderness: There is abdominal tenderness.  Musculoskeletal:        General: Normal range of motion.     Cervical back: Normal range of motion and neck supple.     Right lower leg: No edema.     Left lower leg: No edema.  Lymphadenopathy:     Cervical: No cervical adenopathy.  Skin:    General: Skin is warm and dry.  Neurological:     General: No focal deficit present.     Mental Status: He is alert and oriented to person, place, and time. Mental status is at baseline.  Psychiatric:        Mood and Affect: Mood normal.        Behavior: Behavior normal.        Thought Content: Thought content normal.        Judgment: Judgment normal.    ASSESSMENT & PLAN:  Assessment/Plan:  A 81 y.o. male with metastatic FGFR2 mutation positive cholangiocarcinoma.  As mentioned previously, the patient is scheduled to receive his pemigatinib today.  This agent is an FGFR inhibitor that is specifically being used for him as his disease harbors this mutation.  He has already been scheduled to undergo monthly optical coherence tomography (OCT) testing to ensure this agent does not cause any retinal damage.  With respect to his increased abdominal pain, I will have him undergo a CT scan today to  ascertain his new disease baseline.  He knows to begin taking his pemigatinib immediately as his pain likely is related to some form of disease progression.  I will see him back in 3 weeks to review his scans and reassess him before he heads into his 2nd cycle of pemigatinib.  The patient understands all the plans discussed today and is in agreement with them.    Ariq Khamis Macarthur Critchley, MD

## 2022-03-05 ENCOUNTER — Ambulatory Visit: Payer: Medicare Other | Admitting: Oncology

## 2022-03-05 ENCOUNTER — Inpatient Hospital Stay: Payer: Medicare Other

## 2022-03-12 NOTE — Progress Notes (Signed)
[facility]  [address] [place],  [place]  [address] [phone]  Clinic Day: 03/13/2022  Referring physician: Bonnita Nasuti, MD  HISTORY OF PRESENT ILLNESS:  The patient is a 81 y.o. 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  Due to scans showing disease progression, which was also proven per a peritoneal biopsy, the patient recently started pemigatinib.  He comes in today to be evaluated before heading into his 2nd cycle of therapy.  Unfortunately, since his last visit, the patient was hospitalized for urosepsis, which led to an extended period of an altered mental status.  PHYSICAL EXAM:  There were no vitals taken for this visit.  Wt Readings from Last 3 Encounters:  02/23/22 240 lb 4.8 oz (109 kg)  02/12/22 238 lb 12.8 oz (108.3 kg)  01/22/22 234 lb 11.2 oz (106.5 kg)   There is no height or weight on file to calculate BMI. Performance status (ECOG): 1 - Symptomatic but completely ambulatory Physical Exam Constitutional:      General: He is not in acute distress.    Appearance: Normal appearance. He is normal weight.  HENT:     Head: Normocephalic and atraumatic.  Eyes:     General: No scleral icterus.    Extraocular Movements: Extraocular movements intact.     Conjunctiva/sclera: Conjunctivae normal.     Pupils: Pupils are equal, round, and reactive to light.  Cardiovascular:     Rate and Rhythm: Normal rate and regular rhythm.     Pulses: Normal pulses.     Heart sounds: Normal heart sounds. No murmur heard.    No friction rub. No gallop.  Pulmonary:     Effort: Pulmonary effort is normal. No respiratory distress.     Breath sounds: Normal breath sounds.  Abdominal:     General: Bowel sounds are normal. There is no distension.     Palpations: Abdomen is soft. There is no hepatomegaly, splenomegaly or mass.      Tenderness: There is abdominal tenderness.  Musculoskeletal:        General: Normal range of motion.     Cervical back: Normal range of motion and neck supple.     Right lower leg: No edema.     Left lower leg: No edema.  Lymphadenopathy:     Cervical: No cervical adenopathy.  Skin:    General: Skin is warm and dry.  Neurological:     General: No focal deficit present.     Mental Status: He is alert and oriented to person, place, and time. Mental status is at baseline.  Psychiatric:        Mood and Affect: Mood normal.        Behavior: Behavior normal.        Thought Content: Thought content normal.        Judgment: Judgment normal.    ASSESSMENT & PLAN:  Assessment/Plan:  A 81 y.o. male with metastatic FGFR2 mutation positive cholangiocarcinoma.  As mentioned previously, the patient is scheduled to receive his pemigatinib today.  This agent is an FGFR inhibitor that is specifically being used for him as his disease harbors this mutation.  He has already been scheduled to undergo monthly optical coherence tomography (OCT) testing to ensure this agent does not cause any retinal damage.  With respect to his increased abdominal pain, I will have him undergo a CT scan today to  ascertain his new disease baseline.  He knows to begin taking his pemigatinib immediately as his pain likely is related to some form of disease progression.  I will see him back in 3 weeks to review his scans and reassess him before he heads into his 2nd cycle of pemigatinib.  The patient understands all the plans discussed today and is in agreement with them.    Graylyn Bunney Macarthur Critchley, MD

## 2022-03-13 ENCOUNTER — Inpatient Hospital Stay: Payer: Medicare Other

## 2022-03-13 ENCOUNTER — Inpatient Hospital Stay (INDEPENDENT_AMBULATORY_CARE_PROVIDER_SITE_OTHER): Payer: Medicare Other | Admitting: Oncology

## 2022-03-13 ENCOUNTER — Other Ambulatory Visit: Payer: Self-pay

## 2022-03-13 VITALS — BP 132/60 | HR 75 | Temp 97.6°F | Resp 16 | Ht 74.0 in | Wt 237.5 lb

## 2022-03-13 DIAGNOSIS — C240 Malignant neoplasm of extrahepatic bile duct: Secondary | ICD-10-CM | POA: Diagnosis not present

## 2022-03-13 DIAGNOSIS — R6889 Other general symptoms and signs: Secondary | ICD-10-CM

## 2022-03-13 LAB — BASIC METABOLIC PANEL
BUN: 22 — AB (ref 4–21)
CO2: 29 — AB (ref 13–22)
Chloride: 103 (ref 99–108)
Creatinine: 1.5 — AB (ref 0.6–1.3)
Glucose: 92
Potassium: 4.5 mEq/L (ref 3.5–5.1)
Sodium: 142 (ref 137–147)

## 2022-03-13 LAB — HEPATIC FUNCTION PANEL
ALT: 31 U/L (ref 10–40)
AST: 39 (ref 14–40)
Alkaline Phosphatase: 51 (ref 25–125)
Bilirubin, Total: 0.6

## 2022-03-13 LAB — CBC AND DIFFERENTIAL
HCT: 28 — AB (ref 41–53)
Hemoglobin: 9.3 — AB (ref 13.5–17.5)
Neutrophils Absolute: 5.23
Platelets: 136 10*3/uL — AB (ref 150–400)
WBC: 7.8

## 2022-03-13 LAB — COMPREHENSIVE METABOLIC PANEL
Albumin: 3.8 (ref 3.5–5.0)
Calcium: 9 (ref 8.7–10.7)

## 2022-03-13 LAB — CBC: RBC: 3.03 — AB (ref 3.87–5.11)

## 2022-03-14 ENCOUNTER — Encounter: Payer: Self-pay | Admitting: Oncology

## 2022-03-15 ENCOUNTER — Inpatient Hospital Stay: Payer: Medicare Other

## 2022-03-15 VITALS — BP 136/64 | HR 63 | Temp 98.0°F | Resp 18 | Ht 74.0 in | Wt 239.0 lb

## 2022-03-15 DIAGNOSIS — C240 Malignant neoplasm of extrahepatic bile duct: Secondary | ICD-10-CM

## 2022-03-15 DIAGNOSIS — D6481 Anemia due to antineoplastic chemotherapy: Secondary | ICD-10-CM

## 2022-03-15 DIAGNOSIS — C221 Intrahepatic bile duct carcinoma: Secondary | ICD-10-CM | POA: Diagnosis not present

## 2022-03-15 MED ORDER — EPOETIN ALFA-EPBX 20000 UNIT/ML IJ SOLN
20000.0000 [IU] | Freq: Once | INTRAMUSCULAR | Status: AC
  Administered 2022-03-15: 20000 [IU] via SUBCUTANEOUS
  Filled 2022-03-15: qty 1

## 2022-03-15 NOTE — Patient Instructions (Signed)
Epoetin Alfa injection ?What is this medication? ?EPOETIN ALFA (e POE e tin AL fa) helps your body make more red blood cells. This medicine is used to treat anemia caused by chronic kidney disease, cancer chemotherapy, or HIV-therapy. It may also be used before surgery if you have anemia. ?This medicine may be used for other purposes; ask your health care provider or pharmacist if you have questions. ?COMMON BRAND NAME(S): Epogen, Procrit, Retacrit ?What should I tell my care team before I take this medication? ?They need to know if you have any of these conditions: ?cancer ?heart disease ?high blood pressure ?history of blood clots ?history of stroke ?low levels of folate, iron, or vitamin B12 in the blood ?seizures ?an unusual or allergic reaction to erythropoietin, albumin, benzyl alcohol, hamster proteins, other medicines, foods, dyes, or preservatives ?pregnant or trying to get pregnant ?breast-feeding ?How should I use this medication? ?This medicine is for injection into a vein or under the skin. It is usually given by a health care professional in a hospital or clinic setting. ?If you get this medicine at home, you will be taught how to prepare and give this medicine. Use exactly as directed. Take your medicine at regular intervals. Do not take your medicine more often than directed. ?It is important that you put your used needles and syringes in a special sharps container. Do not put them in a trash can. If you do not have a sharps container, call your pharmacist or healthcare provider to get one. ?A special MedGuide will be given to you by the pharmacist with each prescription and refill. Be sure to read this information carefully each time. ?Talk to your pediatrician regarding the use of this medicine in children. While this drug may be prescribed for selected conditions, precautions do apply. ?Overdosage: If you think you have taken too much of this medicine contact a poison control center or emergency  room at once. ?NOTE: This medicine is only for you. Do not share this medicine with others. ?What if I miss a dose? ?If you miss a dose, take it as soon as you can. If it is almost time for your next dose, take only that dose. Do not take double or extra doses. ?What may interact with this medication? ?Interactions have not been studied. ?This list may not describe all possible interactions. Give your health care provider a list of all the medicines, herbs, non-prescription drugs, or dietary supplements you use. Also tell them if you smoke, drink alcohol, or use illegal drugs. Some items may interact with your medicine. ?What should I watch for while using this medication? ?Your condition will be monitored carefully while you are receiving this medicine. ?You may need blood work done while you are taking this medicine. ?This medicine may cause a decrease in vitamin B6. You should make sure that you get enough vitamin B6 while you are taking this medicine. Discuss the foods you eat and the vitamins you take with your health care professional. ?What side effects may I notice from receiving this medication? ?Side effects that you should report to your doctor or health care professional as soon as possible: ?allergic reactions like skin rash, itching or hives, swelling of the face, lips, or tongue ?seizures ?signs and symptoms of a blood clot such as breathing problems; changes in vision; chest pain; severe, sudden headache; pain, swelling, warmth in the leg; trouble speaking; sudden numbness or weakness of the face, arm or leg ?signs and symptoms of a stroke like   changes in vision; confusion; trouble speaking or understanding; severe headaches; sudden numbness or weakness of the face, arm or leg; trouble walking; dizziness; loss of balance or coordination ?Side effects that usually do not require medical attention (report to your doctor or health care professional if they continue or are  bothersome): ?chills ?cough ?dizziness ?fever ?headaches ?joint pain ?muscle cramps ?muscle pain ?nausea, vomiting ?pain, redness, or irritation at site where injected ?This list may not describe all possible side effects. Call your doctor for medical advice about side effects. You may report side effects to FDA at 1-800-FDA-1088. ?Where should I keep my medication? ?Keep out of the reach of children. ?Store in a refrigerator between 2 and 8 degrees C (36 and 46 degrees F). Do not freeze or shake. Throw away any unused portion if using a single-dose vial. Multi-dose vials can be kept in the refrigerator for up to 21 days after the initial dose. Throw away unused medicine. ?NOTE: This sheet is a summary. It may not cover all possible information. If you have questions about this medicine, talk to your doctor, pharmacist, or health care provider. ?? 2023 Elsevier/Gold Standard (2017-05-14 00:00:00) ? ?

## 2022-03-19 LAB — HM DIABETES EYE EXAM

## 2022-03-21 ENCOUNTER — Telehealth: Payer: Self-pay

## 2022-03-21 NOTE — Telephone Encounter (Signed)
I spoke with pt. He states he was in the hospital a couple of weeks ago. He fell in the yard @ home and had some scratches. They also found out he had a UTI. He stayed for 2 days.  I asked how he was feeling? He replied, "well, up and down". He feels better some days than others. He mentions that physical therapy is seeing him in the home to help w/ambulation and strengthening. "He walked me outside in the yard and I sounded like a freight train, I was so short of breath". Pt denies SOB @ rest. He continues to have intermittent nausea, that is relieved w/antiemetics. He denies emesis, mouth sores, and fevers. He has constipation and hasn't had BM in 7-8 days. Pt doesn't take anything routinely for constipation. He said he had constipation before he even started this medication. I instructed pt to go tonight and get some citrate of magnesia and senna. He will take 1/2 bottle of citrate magnesia, if no results, he will repeat the 1/2 bottle. He will also start taking senna 1-2 tabs po BID to maintain daily BM's. Pt aware to call us if he has a temp of 100.4 or higher, day or night.  Pt is taking the Pemazyre @ 630p without food. He denies missed doses.

## 2022-03-26 NOTE — Progress Notes (Signed)
Patients co-pay assistance ran out for his Pemazyre. We are trying to get free medication through [facility]. I called and spoke with them, they need for the patient to call in at [phone]. I called and left a message on patients voicemail.

## 2022-03-29 ENCOUNTER — Inpatient Hospital Stay: Payer: Medicare Other | Attending: Oncology

## 2022-03-29 ENCOUNTER — Inpatient Hospital Stay: Payer: Medicare Other

## 2022-03-29 VITALS — BP 125/70 | HR 71 | Temp 98.8°F | Resp 20 | Ht 74.0 in

## 2022-03-29 DIAGNOSIS — C787 Secondary malignant neoplasm of liver and intrahepatic bile duct: Secondary | ICD-10-CM | POA: Insufficient documentation

## 2022-03-29 DIAGNOSIS — C221 Intrahepatic bile duct carcinoma: Secondary | ICD-10-CM | POA: Insufficient documentation

## 2022-03-29 DIAGNOSIS — C786 Secondary malignant neoplasm of retroperitoneum and peritoneum: Secondary | ICD-10-CM | POA: Diagnosis not present

## 2022-03-29 DIAGNOSIS — K5900 Constipation, unspecified: Secondary | ICD-10-CM | POA: Diagnosis not present

## 2022-03-29 DIAGNOSIS — C240 Malignant neoplasm of extrahepatic bile duct: Secondary | ICD-10-CM

## 2022-03-29 DIAGNOSIS — D6481 Anemia due to antineoplastic chemotherapy: Secondary | ICD-10-CM

## 2022-03-29 LAB — BASIC METABOLIC PANEL
BUN: 24 — AB (ref 4–21)
CO2: 24 — AB (ref 13–22)
Chloride: 105 (ref 99–108)
Creatinine: 1.8 — AB (ref 0.6–1.3)
Glucose: 138
Potassium: 4.9 mEq/L (ref 3.5–5.1)
Sodium: 138 (ref 137–147)

## 2022-03-29 LAB — HEPATIC FUNCTION PANEL
ALT: 19 U/L (ref 10–40)
AST: 29 (ref 14–40)
Alkaline Phosphatase: 55 (ref 25–125)
Bilirubin, Total: 0.6

## 2022-03-29 LAB — COMPREHENSIVE METABOLIC PANEL
Albumin: 3.9 (ref 3.5–5.0)
Calcium: 9.3 (ref 8.7–10.7)

## 2022-03-29 LAB — CBC AND DIFFERENTIAL
HCT: 31 — AB (ref 41–53)
Hemoglobin: 9.9 — AB (ref 13.5–17.5)
Neutrophils Absolute: 4.29
Platelets: 144 10*3/uL — AB (ref 150–400)
WBC: 6.4

## 2022-03-29 LAB — CBC: RBC: 3.35 — AB (ref 3.87–5.11)

## 2022-03-29 MED ORDER — EPOETIN ALFA-EPBX 20000 UNIT/ML IJ SOLN
20000.0000 [IU] | Freq: Once | INTRAMUSCULAR | Status: AC
  Administered 2022-03-29: 20000 [IU] via SUBCUTANEOUS

## 2022-03-29 NOTE — Progress Notes (Signed)
Gary [name] c/o watery eyes all the time since starting his po chemo.  Ulice Dash, rph notified and discussed w/pt that the type of chemo pill he is on does have occular side effects and the need for him to follow up with any eye dr regularly.  Pt verbalizes understanding.

## 2022-03-29 NOTE — Patient Instructions (Signed)
Epoetin Alfa injection ?What is this medication? ?EPOETIN ALFA (e POE e tin AL fa) helps your body make more red blood cells. This medicine is used to treat anemia caused by chronic kidney disease, cancer chemotherapy, or HIV-therapy. It may also be used before surgery if you have anemia. ?This medicine may be used for other purposes; ask your health care provider or pharmacist if you have questions. ?COMMON BRAND NAME(S): Epogen, Procrit, Retacrit ?What should I tell my care team before I take this medication? ?They need to know if you have any of these conditions: ?cancer ?heart disease ?high blood pressure ?history of blood clots ?history of stroke ?low levels of folate, iron, or vitamin B12 in the blood ?seizures ?an unusual or allergic reaction to erythropoietin, albumin, benzyl alcohol, hamster proteins, other medicines, foods, dyes, or preservatives ?pregnant or trying to get pregnant ?breast-feeding ?How should I use this medication? ?This medicine is for injection into a vein or under the skin. It is usually given by a health care professional in a hospital or clinic setting. ?If you get this medicine at home, you will be taught how to prepare and give this medicine. Use exactly as directed. Take your medicine at regular intervals. Do not take your medicine more often than directed. ?It is important that you put your used needles and syringes in a special sharps container. Do not put them in a trash can. If you do not have a sharps container, call your pharmacist or healthcare provider to get one. ?A special MedGuide will be given to you by the pharmacist with each prescription and refill. Be sure to read this information carefully each time. ?Talk to your pediatrician regarding the use of this medicine in children. While this drug may be prescribed for selected conditions, precautions do apply. ?Overdosage: If you think you have taken too much of this medicine contact a poison control center or emergency  room at once. ?NOTE: This medicine is only for you. Do not share this medicine with others. ?What if I miss a dose? ?If you miss a dose, take it as soon as you can. If it is almost time for your next dose, take only that dose. Do not take double or extra doses. ?What may interact with this medication? ?Interactions have not been studied. ?This list may not describe all possible interactions. Give your health care provider a list of all the medicines, herbs, non-prescription drugs, or dietary supplements you use. Also tell them if you smoke, drink alcohol, or use illegal drugs. Some items may interact with your medicine. ?What should I watch for while using this medication? ?Your condition will be monitored carefully while you are receiving this medicine. ?You may need blood work done while you are taking this medicine. ?This medicine may cause a decrease in vitamin B6. You should make sure that you get enough vitamin B6 while you are taking this medicine. Discuss the foods you eat and the vitamins you take with your health care professional. ?What side effects may I notice from receiving this medication? ?Side effects that you should report to your doctor or health care professional as soon as possible: ?allergic reactions like skin rash, itching or hives, swelling of the face, lips, or tongue ?seizures ?signs and symptoms of a blood clot such as breathing problems; changes in vision; chest pain; severe, sudden headache; pain, swelling, warmth in the leg; trouble speaking; sudden numbness or weakness of the face, arm or leg ?signs and symptoms of a stroke like   changes in vision; confusion; trouble speaking or understanding; severe headaches; sudden numbness or weakness of the face, arm or leg; trouble walking; dizziness; loss of balance or coordination ?Side effects that usually do not require medical attention (report to your doctor or health care professional if they continue or are  bothersome): ?chills ?cough ?dizziness ?fever ?headaches ?joint pain ?muscle cramps ?muscle pain ?nausea, vomiting ?pain, redness, or irritation at site where injected ?This list may not describe all possible side effects. Call your doctor for medical advice about side effects. You may report side effects to FDA at 1-800-FDA-1088. ?Where should I keep my medication? ?Keep out of the reach of children. ?Store in a refrigerator between 2 and 8 degrees C (36 and 46 degrees F). Do not freeze or shake. Throw away any unused portion if using a single-dose vial. Multi-dose vials can be kept in the refrigerator for up to 21 days after the initial dose. Throw away unused medicine. ?NOTE: This sheet is a summary. It may not cover all possible information. If you have questions about this medicine, talk to your doctor, pharmacist, or health care provider. ?? 2023 Elsevier/Gold Standard (2017-05-14 00:00:00) ? ?

## 2022-03-30 ENCOUNTER — Other Ambulatory Visit: Payer: Self-pay

## 2022-03-30 NOTE — Progress Notes (Signed)
Patients co-pay assistance has ran out, he will no longer get Pemazyre from [facility]. He was approved for free medication through [facility] until 09/23/2022. He did have to switch to the 13.'5mg'$  pill once daily. [phone]

## 2022-04-03 ENCOUNTER — Ambulatory Visit: Payer: Medicare Other | Admitting: Hematology and Oncology

## 2022-04-03 ENCOUNTER — Ambulatory Visit: Payer: Medicare Other | Admitting: Oncology

## 2022-04-03 ENCOUNTER — Other Ambulatory Visit: Payer: Medicare Other

## 2022-04-03 NOTE — Progress Notes (Deleted)
[facility]  [address] [place],  [place]  [address] [phone]  Clinic Day:  04/03/2022  Referring physician: Bonnita Nasuti, MD   HISTORY OF PRESENT ILLNESS:  The patient is a 81 y.o. 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  Due to scans showing disease progression, which was also proven per a peritoneal biopsy in April, the patient is currently on pemigatinib.  He comes in today to be evaluated before heading into his 4th cycle of therapy.  Overall, the patient claims to be tolerating his pemigatinib oral therapy very well.  He denies having any eye problems or other side effects related to his oral agent.  He denies having any new symptoms/findings which concerns him for overt signs of disease progression.   PHYSICAL EXAM:  There were no vitals taken for this visit. Wt Readings from Last 3 Encounters:  03/15/22 239 lb 0.6 oz (108.4 kg)  03/13/22 237 lb 8 oz (107.7 kg)  02/23/22 240 lb 4.8 oz (109 kg)   There is no height or weight on file to calculate BMI.  Performance status (ECOG): {CHL ONC [id number]  Physical Exam Vitals and nursing note reviewed.  Constitutional:      General: He is not in acute distress.    Appearance: Normal appearance. He is normal weight.  HENT:     Head: Normocephalic and atraumatic.     Mouth/Throat:     Mouth: Mucous membranes are moist.     Pharynx: Oropharynx is clear. No oropharyngeal exudate or posterior oropharyngeal erythema.  Eyes:     General: No scleral icterus.    Extraocular Movements: Extraocular movements intact.     Conjunctiva/sclera: Conjunctivae normal.     Pupils: Pupils are equal, round, and reactive to light.  Cardiovascular:     Rate and Rhythm: Normal rate and regular rhythm.     Heart sounds: Normal heart sounds. No murmur heard.    No friction rub. No  gallop.  Pulmonary:     Effort: Pulmonary effort is normal.     Breath sounds: Normal breath sounds. No wheezing, rhonchi or rales.  Abdominal:     General: Bowel sounds are normal. There is no distension.     Palpations: Abdomen is soft. There is no hepatomegaly, splenomegaly or mass.     Tenderness: There is no abdominal tenderness.  Musculoskeletal:        General: Normal range of motion.     Cervical back: Normal range of motion and neck supple. No tenderness.     Right lower leg: No edema.     Left lower leg: No edema.  Lymphadenopathy:     Cervical: No cervical adenopathy.     Upper Body:     Right upper body: No supraclavicular or axillary adenopathy.     Left upper body: No supraclavicular or axillary adenopathy.     Lower Body: No right inguinal adenopathy. No left inguinal adenopathy.  Skin:    General: Skin is warm and dry.     Coloration: Skin is not jaundiced.     Findings: No rash.  Neurological:     Mental Status: He is alert and oriented to person, place, and time.     Cranial Nerves: No cranial nerve deficit.  Psychiatric:        Mood and Affect: Mood normal.        Behavior: Behavior  normal.        Thought Content: Thought content normal.     LABS:      Latest Ref Rng & Units 03/29/2022   12:00 AM 03/13/2022   12:00 AM 02/23/2022   12:00 AM  CBC  WBC  6.4     7.8     5.8      Hemoglobin 13.5 - 17.5 9.9     9.3     9.2      Hematocrit 41 - 53 _0 Platelets 150 - 400 K/uL 144     136     116         This result is from an external source.      Latest Ref Rng & Units 03/29/2022   12:00 AM 03/13/2022   12:00 AM 02/23/2022   12:00 AM  CMP  BUN 4 - _1 Creatinine 0.6 - 1.3 1.8     1.5     1.6      Sodium 137 - 147 138     142     142      Potassium 3.5 - 5.1 mEq/L 4.9     4.5     4.6      Chloride 99 - 108 105     103     104      CO2 13 - _2 Calcium 8.7 - 10.7 9.3     9.0     9.6      Alkaline  Phos 25 - 125 55     51     43      AST 14 - 40 29     39     33      ALT 10 - 40 U/L _3 This result is from an external source.     No results found for: "CEA1", "CEA" / No results found for: "CEA1", "CEA" No results found for: "PSA1" No results found for: "UUV253" No results found for: "CAN125"  No results found for: "TOTALPROTELP", "ALBUMINELP", "A1GS", "A2GS", "BETS", "BETA2SER", "GAMS", "MSPIKE", "SPEI" Lab Results  Component Value Date   TIBC 293 02/23/2022   TIBC 307 08/10/2021   FERRITIN 151 02/23/2022   FERRITIN 220 08/10/2021   IRONPCTSAT 16 (L) 02/23/2022   IRONPCTSAT 15 (L) 08/10/2021   No results found for: "LDH"  No results found for: "AFPTUMOR", "TOTALPROTELP", "ALBUMINELP", "A1GS", "A2GS", "BETS", "BETA2SER", "GAMS", "MSPIKE", "SPEI", "LDH", "CEA1", "CEA", "PSA1", "IGASERUM", "IGGSERUM", "IGMSERUM", "THGAB", "THYROGLB"  Review Flowsheet       Latest Ref Rng & Units 08/10/2021 02/23/2022  Oncology Labs  Ferritin 24 - 336 ng/mL 220  151   %SAT 17.9 - 39.5 % 15  16      STUDIES:  No results found.    ASSESSMENT & PLAN:   Assessment/Plan:  A 81 y.o. male with  metastatic FGFR2 mutation positive cholangiocarcinoma.  ***He just started his 4th cycle of oral pemigatinib yesterday.  He is already scheduled to undergo monthly optical coherence tomography (OCT) testing to ensure pemigatinib is not causing any retinal damage.  As his hemoglobin remains under  10, he will continue to receive Retacrit with each cycle of pemigatinib therapy.  Clinically, he is doing well.  I will see him back in 3 weeks before he heads into his 5th cycle of pemigatinib.  Prior to this, he will undergo repeat CT abdomen and pelvis to reassess his disease baseline.  The patient understands all the plans discussed today and is in agreement with them.  He knows to contact our office if he develops concerns prior to that appointment.   Marvia Pickles, PA-C

## 2022-04-04 ENCOUNTER — Telehealth: Payer: Self-pay

## 2022-04-04 NOTE — Telephone Encounter (Signed)
I spoke with pt to see how he is doing. He states, "I'm doing the best I can. I cant remember much. I don't have my medicine. If it came, I don't know where I put it. Here, talk to my granddaughter". His granddaughter, Gary [name], states they have just called [employer] [phone]) to reorder the misplaced shipment. I asked her to please call me once the new shipment arrives. She agreed to do that. She gave me her phone number as well. She then handed phone back to Gary [name]. Pt states his memory is not good. He denies N/V, mouth sores, diarrhea, & skin rash/itching. He is still having constipation but isn't taking anything daily to prevent it, which is important as he is taking pain medication. I recommended he try taking 2 tablespoons of Mag Citrate w/ 2 tablespoons of mineral oil. If no results in 56mn to 1 hour, he may repeat ([facility].  I asked to speak with [name] again to ensure she had accurate directions because it has been over 4-5 days since last BM. Pt had hard time getting constipation cleared up a few days ago. He states he took the bottle of mag citrate, but it didn't work. He then took 2 capfuls of Milk of Magnesia, and repeated MOM once more - 1 capful w/ results.  I told him he must take something daily to stay ahead of the constipation. He asked, "like a stool softener"? I replied, "you need to take senna S daily, as you are taking pain medication". AJeannie Doneheard my instructions. We confirmed his appt here tomorrow for labs and to see [name],PA.

## 2022-04-05 ENCOUNTER — Inpatient Hospital Stay (INDEPENDENT_AMBULATORY_CARE_PROVIDER_SITE_OTHER): Payer: Medicare Other | Admitting: Hematology and Oncology

## 2022-04-05 ENCOUNTER — Encounter: Payer: Self-pay | Admitting: Hematology and Oncology

## 2022-04-05 ENCOUNTER — Inpatient Hospital Stay: Payer: Medicare Other

## 2022-04-05 VITALS — BP 139/72 | HR 77 | Temp 98.5°F | Resp 20 | Ht 74.0 in | Wt 229.0 lb

## 2022-04-05 DIAGNOSIS — D6481 Anemia due to antineoplastic chemotherapy: Secondary | ICD-10-CM

## 2022-04-05 DIAGNOSIS — R519 Headache, unspecified: Secondary | ICD-10-CM

## 2022-04-05 DIAGNOSIS — R6889 Other general symptoms and signs: Secondary | ICD-10-CM

## 2022-04-05 DIAGNOSIS — T451X5A Adverse effect of antineoplastic and immunosuppressive drugs, initial encounter: Secondary | ICD-10-CM

## 2022-04-05 DIAGNOSIS — C240 Malignant neoplasm of extrahepatic bile duct: Secondary | ICD-10-CM | POA: Diagnosis not present

## 2022-04-05 DIAGNOSIS — C221 Intrahepatic bile duct carcinoma: Secondary | ICD-10-CM | POA: Diagnosis not present

## 2022-04-05 DIAGNOSIS — C786 Secondary malignant neoplasm of retroperitoneum and peritoneum: Secondary | ICD-10-CM | POA: Diagnosis not present

## 2022-04-05 DIAGNOSIS — C787 Secondary malignant neoplasm of liver and intrahepatic bile duct: Secondary | ICD-10-CM

## 2022-04-05 LAB — CBC AND DIFFERENTIAL
HCT: 32 — AB (ref 41–53)
Hemoglobin: 10.2 — AB (ref 13.5–17.5)
Neutrophils Absolute: 5.18
Platelets: 150 10*3/uL (ref 150–400)
WBC: 7.1

## 2022-04-05 LAB — HEPATIC FUNCTION PANEL
ALT: 20 U/L (ref 10–40)
AST: 28 (ref 14–40)
Alkaline Phosphatase: 50 (ref 25–125)
Bilirubin, Total: 0.6

## 2022-04-05 LAB — BASIC METABOLIC PANEL
BUN: 28 — AB (ref 4–21)
CO2: 25 — AB (ref 13–22)
Chloride: 108 (ref 99–108)
Creatinine: 1.1 (ref 0.6–1.3)
Glucose: 132
Potassium: 4.8 mEq/L (ref 3.5–5.1)
Sodium: 140 (ref 137–147)

## 2022-04-05 LAB — COMPREHENSIVE METABOLIC PANEL
Albumin: 3.9 (ref 3.5–5.0)
Calcium: 9.3 (ref 8.7–10.7)

## 2022-04-05 LAB — CBC
MCV: 93 (ref 80–94)
RBC: 3.46 — AB (ref 3.87–5.11)

## 2022-04-05 MED ORDER — SODIUM CHLORIDE 0.9% FLUSH
10.0000 mL | INTRAVENOUS | Status: DC | PRN
  Administered 2022-04-05: 10 mL

## 2022-04-05 NOTE — Progress Notes (Cosign Needed)
[facility]  [address] [place],  [place]  [address] [phone]  Clinic Day:  04/05/2022  Referring physician: Bonnita Nasuti, MD   HISTORY OF PRESENT ILLNESS:  The patient is a 81 y.o. 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  Due to scans showing disease progression, which was also proven per a peritoneal biopsy, the patient is currently on pemigatinib.  He comes in today to be evaluated before heading into his 4th cycle of therapy.  Unfortunately, he has had several issues in the past week.  He states he has been out of his pemigatinib for at least 2 weeks, but as far as he remembers he has completed 3 cycles.  He reports a constant headache for the past 6 days, as well as intermittent confusion and hallucinations.he also reports leg weakness.  He states he tried Tylenol for the headache without relief.  He also reports difficulty with short-term memory.  He states he had surgery on his eye to correct the previous issues, which has helped his vision.  He otherwise denies vision changes.  He reports mild nausea without vomiting, which he has had since starting on pemigatinib.  He has been constipated but has not been taking anything regularly for constipation.  He reports a recent urinary tract infection for which she has completed the antibiotic.  His granddaughter accompanies him today and states that he did have a bowel movement 2 days ago.  She states she has called the specialty pharmacy to deliver his medication.  She confirms his report of confusion and hallucinations.  He saw Dr. Jannette Fogo on Monday and states he was going to set him up to check his head.  I contacted Dr. Tenna Delaine office and the only thing that is scheduled as a follow-up on July 31.   PHYSICAL EXAM:  Blood pressure 139/72, pulse 77, temperature 98.5 F (36.9 C),  temperature source Oral, resp. rate 20, height $RemoveBe'6\' 2"'bocmCkTxr$  (1.88 m), weight 229 lb (103.9 kg), SpO2 94 %. Wt Readings from Last 3 Encounters:  04/05/22 229 lb (103.9 kg)  03/15/22 239 lb 0.6 oz (108.4 kg)  03/13/22 237 lb 8 oz (107.7 kg)   Body mass index is 29.4 kg/m.  Performance status (ECOG): 2 - Symptomatic, <50% confined to bed  Physical Exam Vitals and nursing note reviewed.  Constitutional:      General: He is not in acute distress.    Appearance: Normal appearance. He is normal weight.  HENT:     Head: Normocephalic and atraumatic.     Mouth/Throat:     Mouth: Mucous membranes are moist.     Pharynx: Oropharynx is clear. No oropharyngeal exudate or posterior oropharyngeal erythema.  Eyes:     General: No scleral icterus.    Extraocular Movements: Extraocular movements intact.     Conjunctiva/sclera: Conjunctivae normal.     Pupils: Pupils are equal, round, and reactive to light.  Cardiovascular:     Rate and Rhythm: Normal rate and regular rhythm.     Heart sounds: Normal heart sounds. No murmur heard.    No friction rub. No gallop.  Pulmonary:     Effort: Pulmonary effort is normal.     Breath sounds: Normal breath sounds. No wheezing, rhonchi or rales.  Abdominal:     General: Bowel sounds are normal. There is no distension.     Palpations: Abdomen is soft. There  is no hepatomegaly, splenomegaly or mass.     Tenderness: There is no abdominal tenderness.  Musculoskeletal:        General: Normal range of motion.     Cervical back: Normal range of motion and neck supple. No tenderness.     Right lower leg: No edema.     Left lower leg: No edema.  Lymphadenopathy:     Cervical: No cervical adenopathy.     Upper Body:     Right upper body: No supraclavicular or axillary adenopathy.     Left upper body: No supraclavicular or axillary adenopathy.     Lower Body: No right inguinal adenopathy. No left inguinal adenopathy.  Skin:    General: Skin is warm and dry.      Coloration: Skin is not jaundiced.     Findings: No rash.  Neurological:     Mental Status: He is alert and oriented to person, place, and time.     Cranial Nerves: No cranial nerve deficit.  Psychiatric:        Mood and Affect: Mood normal.        Behavior: Behavior normal.        Thought Content: Thought content normal.     LABS:      Latest Ref Rng & Units 04/05/2022   12:00 AM 03/29/2022   12:00 AM 03/13/2022   12:00 AM  CBC  WBC  7.1     6.4     7.8      Hemoglobin 13.5 - 17.5 10.2     9.9     9.3      Hematocrit 41 - 53 32     31     28      Platelets 150 - 400 K/uL 150     144     136         This result is from an external source.      Latest Ref Rng & Units 04/05/2022   12:00 AM 03/29/2022   12:00 AM 03/13/2022   12:00 AM  CMP  BUN 4 - $R'21 28     24     22      'xu$ Creatinine 0.6 - 1.3 1.1     1.8     1.5      Sodium 137 - 147 140     138     142      Potassium 3.5 - 5.1 mEq/L 4.8     4.9     4.5      Chloride 99 - 108 108     105     103      CO2 13 - $Re'22 25     24     29      'AWh$ Calcium 8.7 - 10.7 9.3     9.3     9.0      Alkaline Phos 25 - 125 50     55     51      AST 14 - 40 28     29     39      ALT 10 - 40 U/L $Remo'20     19     31         'DGLxj$ This result is from an external source.     No results found for: "CEA1", "CEA" / No results found for: "CEA1", "CEA" No results found for: "PSA1" No results found for: "DQQ229" No  results found for: "CAN125"  No results found for: "TOTALPROTELP", "ALBUMINELP", "A1GS", "A2GS", "BETS", "BETA2SER", "GAMS", "MSPIKE", "SPEI" Lab Results  Component Value Date   TIBC 293 02/23/2022   TIBC 307 08/10/2021   FERRITIN 151 02/23/2022   FERRITIN 220 08/10/2021   IRONPCTSAT 16 (L) 02/23/2022   IRONPCTSAT 15 (L) 08/10/2021   No results found for: "LDH"  No results found for: "AFPTUMOR", "TOTALPROTELP", "ALBUMINELP", "A1GS", "A2GS", "BETS", "BETA2SER", "GAMS", "MSPIKE", "SPEI", "LDH", "CEA1", "CEA", "PSA1", "IGASERUM", "IGGSERUM",  "IGMSERUM", "THGAB", "THYROGLB"  Review Flowsheet       Latest Ref Rng & Units 08/10/2021 02/23/2022  Oncology Labs  Ferritin 24 - 336 ng/mL 220  151   %SAT 17.9 - 39.5 % 15  16      STUDIES:  No results found.    ASSESSMENT & PLAN:   Assessment/Plan:  A 81 y.o. male with metastatic FGFR2 mutation positive cholangiocarcinoma.  He has not received his oral pemigatinib for his fourth cycle yet.  He is already scheduled to undergo monthly optical coherence tomography (OCT) testing to ensure pemigatinib is not causing any retinal damage.  He has been having neurologic issues and I recommended an MRI head, but one cannot be done stat here the patient did not wish to travel to have it done.  Therefore a CT of the head with and without contrast will be done today.  He will see Dr. Bobby Rumpf tomorrow for further recommendations.  The patient and his granddaughter understand all the plans discussed today and are in agreement with them.      Marvia Pickles, PA-C

## 2022-04-06 ENCOUNTER — Encounter: Payer: Self-pay | Admitting: Hematology and Oncology

## 2022-04-06 ENCOUNTER — Inpatient Hospital Stay (INDEPENDENT_AMBULATORY_CARE_PROVIDER_SITE_OTHER): Payer: Medicare Other | Admitting: Oncology

## 2022-04-06 VITALS — BP 166/87 | HR 71 | Temp 97.8°F | Resp 16 | Ht 74.0 in | Wt 230.7 lb

## 2022-04-06 DIAGNOSIS — C240 Malignant neoplasm of extrahepatic bile duct: Secondary | ICD-10-CM | POA: Diagnosis not present

## 2022-04-06 NOTE — Progress Notes (Signed)
[facility]  [address] [place],  [place]  [address] [phone]  Clinic Day:  04/11/2022  Referring physician: Bonnita Nasuti, MD   HISTORY OF PRESENT ILLNESS:  The patient is a 81 y.o. 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  Due to scans showing disease progression, which was also proven per a peritoneal biopsy, the patient is currently on pemigatinib.  He comes in today to be evaluated before heading into his 4th cycle of therapy.  Due to having confusion and hallucinations, a head CT was done yesterday to rule out intracranial pathology.  He comes in today to go over the scans, as well as to reassess how he is doing.  Overall, the patient claims to be feeling much better.  He is much clearer and thinking in his decision-making.  His granddaughter reaffirms that there were intermittent issues with visual hallucinations over the past few weeks.   As it pertains to his disease, he denies having any abdominal pain or other symptoms that concern him for disease progression while on pemigatinib.  PHYSICAL EXAM:  Blood pressure (!) 166/87, pulse 71, temperature 97.8 F (36.6 C), resp. rate 16, height $RemoveBe'6\' 2"'eeMiwHoCk$  (1.88 m), weight 230 lb 11.2 oz (104.6 kg), SpO2 93 %. Wt Readings from Last 3 Encounters:  04/06/22 230 lb 11.2 oz (104.6 kg)  04/05/22 229 lb (103.9 kg)  03/15/22 239 lb 0.6 oz (108.4 kg)   Body mass index is 29.62 kg/m.  Performance status (ECOG): 1  Physical Exam Vitals and nursing note reviewed.  Constitutional:      General: He is not in acute distress.    Appearance: Normal appearance. He is normal weight.  HENT:     Head: Normocephalic and atraumatic.     Mouth/Throat:     Mouth: Mucous membranes are moist.     Pharynx: Oropharynx is clear. No oropharyngeal exudate or posterior oropharyngeal erythema.  Eyes:      General: No scleral icterus.    Extraocular Movements: Extraocular movements intact.     Conjunctiva/sclera: Conjunctivae normal.     Pupils: Pupils are equal, round, and reactive to light.  Cardiovascular:     Rate and Rhythm: Normal rate and regular rhythm.     Heart sounds: Normal heart sounds. No murmur heard.    No friction rub. No gallop.  Pulmonary:     Effort: Pulmonary effort is normal.     Breath sounds: Normal breath sounds. No wheezing, rhonchi or rales.  Abdominal:     General: Bowel sounds are normal. There is no distension.     Palpations: Abdomen is soft. There is no hepatomegaly, splenomegaly or mass.     Tenderness: There is no abdominal tenderness.  Musculoskeletal:        General: Normal range of motion.     Cervical back: Normal range of motion and neck supple. No tenderness.     Right lower leg: No edema.     Left lower leg: No edema.  Lymphadenopathy:     Cervical: No cervical adenopathy.     Upper Body:     Right upper body: No supraclavicular or axillary adenopathy.     Left upper body: No supraclavicular or axillary adenopathy.     Lower Body: No right inguinal adenopathy. No left inguinal adenopathy.  Skin:    General: Skin is warm and dry.     Coloration:  Skin is not jaundiced.     Findings: No rash.  Neurological:     Mental Status: He is alert and oriented to person, place, and time.     Cranial Nerves: No cranial nerve deficit.  Psychiatric:        Mood and Affect: Mood normal.        Behavior: Behavior normal.        Thought Content: Thought content normal.   SCANS:  His head CT done yesterday revealed the following: FINDINGS: Brain: No evidence of acute infarction, hemorrhage, hydrocephalus, extra-axial collection or mass lesion/mass effect. No focus of abnormal contrast enhancement identified.  Vascular: No hyperdense vessel or unexpected calcification. Visible vessels are patent.  Skull: Normal. Negative for fracture or focal  lesion.  Sinuses/Orbits: No acute finding.  Other: None.  IMPRESSION: 1. No acute intracranial abnormality. No evidence of intracranial metastatic disease. 2. In case of high clinical suspicion for intracranial metastatic disease, consider MRI of the brain without and with contrast.  LABS:      Latest Ref Rng & Units 04/05/2022   12:00 AM 03/29/2022   12:00 AM 03/13/2022   12:00 AM  CBC  WBC  7.1     6.4     7.8      Hemoglobin 13.5 - 17.5 10.2     9.9     9.3      Hematocrit 41 - 53 32     31     28      Platelets 150 - 400 K/uL 150     144     136         This result is from an external source.      Latest Ref Rng & Units 04/05/2022   12:00 AM 03/29/2022   12:00 AM 03/13/2022   12:00 AM  CMP  BUN 4 - _0 Creatinine 0.6 - 1.3 1.1     1.8     1.5      Sodium 137 - 147 140     138     142      Potassium 3.5 - 5.1 mEq/L 4.8     4.9     4.5      Chloride 99 - 108 108     105     103      CO2 13 - _1 Calcium 8.7 - 10.7 9.3     9.3     9.0      Alkaline Phos 25 - 125 50     55     51      AST 14 - 40 28     29     39      ALT 10 - 40 U/L _2 This result is from an external source.   ASSESSMENT & PLAN:  Assessment/Plan:  A 81 y.o. male with metastatic FGFR2 mutation positive cholangiocarcinoma.  In clinic today, went over his head CT images with him, for which she can see that he fortunately does not have any evidence of CNS disease.  His mentation appears to be normal today.  I do not get the sense of any type of abnormal neurologic activity/behavior.  The patient brings to my attention that he has just received his 4th cycle of oral pemigatinib, which he knows to begin taking.  He is already scheduled to undergo monthly optical coherence tomography (OCT) testing to ensure his pemigatinib therapy is not causing any retinal damage.  Otherwise, I will see this patient back in 3 weeks before he heads into his fifth cycle  of pemigatinib.  An abdominal MRI will be done before his next visit l to ascertain his new disease baseline after 4 cycles of treatment.  The patient and his granddaughter understand all the plans discussed today and are in agreement with them.    Giana Castner Macarthur Critchley, MD

## 2022-04-11 ENCOUNTER — Encounter: Payer: Self-pay | Admitting: Oncology

## 2022-04-11 ENCOUNTER — Other Ambulatory Visit: Payer: Self-pay | Admitting: Pharmacist

## 2022-04-11 ENCOUNTER — Other Ambulatory Visit: Payer: Self-pay | Admitting: Oncology

## 2022-04-11 ENCOUNTER — Telehealth: Payer: Self-pay | Admitting: Oncology

## 2022-04-11 DIAGNOSIS — C240 Malignant neoplasm of extrahepatic bile duct: Secondary | ICD-10-CM

## 2022-04-11 NOTE — Telephone Encounter (Signed)
04/11/22 left msg -inj cxl on 04/12/22.Next appts scheduled

## 2022-04-12 ENCOUNTER — Ambulatory Visit: Payer: Medicare Other

## 2022-04-13 NOTE — Progress Notes (Signed)
Patient had misplaced his Pem[employer]re, his Granddaughter called and ordered another shipment. He received this on 07/18, and started back taking it that day.

## 2022-04-15 ENCOUNTER — Encounter: Payer: Self-pay | Admitting: Oncology

## 2022-04-19 ENCOUNTER — Other Ambulatory Visit: Payer: Self-pay | Admitting: Pharmacist

## 2022-04-23 ENCOUNTER — Telehealth: Payer: Self-pay

## 2022-04-23 NOTE — Telephone Encounter (Signed)
I called pt back letting him know his upcomming apt. Pt stated no further questions.

## 2022-04-24 ENCOUNTER — Other Ambulatory Visit: Payer: Self-pay

## 2022-04-25 ENCOUNTER — Ambulatory Visit: Payer: Medicare Other | Admitting: Hematology and Oncology

## 2022-04-25 ENCOUNTER — Other Ambulatory Visit: Payer: Medicare Other

## 2022-04-26 ENCOUNTER — Ambulatory Visit: Payer: Medicare Other

## 2022-04-26 ENCOUNTER — Inpatient Hospital Stay (INDEPENDENT_AMBULATORY_CARE_PROVIDER_SITE_OTHER): Payer: Medicare Other | Admitting: Hematology and Oncology

## 2022-04-26 ENCOUNTER — Telehealth: Payer: Self-pay | Admitting: Hematology and Oncology

## 2022-04-26 ENCOUNTER — Inpatient Hospital Stay: Payer: Medicare Other | Attending: Oncology

## 2022-04-26 ENCOUNTER — Encounter: Payer: Self-pay | Admitting: Hematology and Oncology

## 2022-04-26 VITALS — BP 128/59 | HR 83 | Temp 98.4°F | Resp 16 | Ht 74.0 in | Wt 226.4 lb

## 2022-04-26 DIAGNOSIS — C240 Malignant neoplasm of extrahepatic bile duct: Secondary | ICD-10-CM

## 2022-04-26 LAB — BASIC METABOLIC PANEL
BUN: 23 — AB (ref 4–21)
CO2: 27 — AB (ref 13–22)
Chloride: 104 (ref 99–108)
Creatinine: 1.7 — AB (ref 0.6–1.3)
Glucose: 103
Potassium: 4.3 mEq/L (ref 3.5–5.1)
Sodium: 139 (ref 137–147)

## 2022-04-26 LAB — CBC AND DIFFERENTIAL
HCT: 34 — AB (ref 41–53)
Hemoglobin: 11.1 — AB (ref 13.5–17.5)
Neutrophils Absolute: 5.4
Platelets: 119 10*3/uL — AB (ref 150–400)
WBC: 8.3

## 2022-04-26 LAB — HEPATIC FUNCTION PANEL
ALT: 26 U/L (ref 10–40)
AST: 30 (ref 14–40)
Alkaline Phosphatase: 61 (ref 25–125)
Bilirubin, Total: 0.6

## 2022-04-26 LAB — CBC
MCV: 90 (ref 80–94)
RBC: 3.79 — AB (ref 3.87–5.11)

## 2022-04-26 LAB — COMPREHENSIVE METABOLIC PANEL
Albumin: 3.9 (ref 3.5–5.0)
Calcium: 9.8 (ref 8.7–10.7)

## 2022-04-26 NOTE — Telephone Encounter (Signed)
Per 04/26/22 los next appt scheduled and confirmed with patient

## 2022-04-26 NOTE — Progress Notes (Cosign Needed)
[facility]  [address] [place],  [place]  [address] [phone]  Clinic Day:  04/26/2022  Referring physician: Bonnita Nasuti, MD   HISTORY OF PRESENT ILLNESS:  The patient is a 81 y.o. 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  Due to scans showing disease progression, which was also proven per a peritoneal biopsy, the patient is currently on pemigatinib.  He also receives Retacrit every 2 weeks to keep his hemoglobin at or above 10.  He comes in today to be evaluated before heading into his 5th cycle of therapy.  He states this is due to start on August 8.  Prior to his 4th cycle, he presented with confusion and hallucinations.  CT head did not reveal any intracranial pathology.  Overall, the patient claims to be doing well except for taste changes and decreased appetite.  He does not like nutritional supplements.  He denies recurrent hallucinations and continues to be clearer and thinking in his decision-making.  As it pertains to his disease, he denies having any abdominal pain or other symptoms that concern him for disease progression while on pemigatinib.  He states he had recent cataract surgery of the right eye.  He denies seeing the ophthalmologist on a regular basis.  PHYSICAL EXAM:  Blood pressure (!) 128/59, pulse 83, temperature 98.4 F (36.9 C), resp. rate 16, height 6' 2" (1.88 m), weight 226 lb 6.4 oz (102.7 kg), SpO2 93 %. Wt Readings from Last 3 Encounters:  04/26/22 226 lb 6.4 oz (102.7 kg)  04/06/22 230 lb 11.2 oz (104.6 kg)  04/05/22 229 lb (103.9 kg)   Body mass index is 29.07 kg/m.  Performance status (ECOG): 1 - Symptomatic but completely ambulatory  Physical Exam Vitals and nursing note reviewed.  Constitutional:      General: He is not in acute distress.    Appearance: Normal appearance. He is normal  weight.  HENT:     Head: Normocephalic and atraumatic.     Mouth/Throat:     Mouth: Mucous membranes are moist.     Pharynx: Oropharynx is clear. No oropharyngeal exudate or posterior oropharyngeal erythema.  Eyes:     General: No scleral icterus.    Extraocular Movements: Extraocular movements intact.     Conjunctiva/sclera: Conjunctivae normal.     Pupils: Pupils are equal, round, and reactive to light.  Cardiovascular:     Rate and Rhythm: Normal rate and regular rhythm.     Heart sounds: Normal heart sounds. No murmur heard.    No friction rub. No gallop.  Pulmonary:     Effort: Pulmonary effort is normal.     Breath sounds: Normal breath sounds. No wheezing, rhonchi or rales.  Abdominal:     General: Bowel sounds are normal. There is no distension.     Palpations: Abdomen is soft. There is no hepatomegaly, splenomegaly or mass.     Tenderness: There is no abdominal tenderness.  Musculoskeletal:        General: Normal range of motion.     Cervical back: Normal range of motion and neck supple. No tenderness.     Right lower leg: No edema.     Left lower leg: No edema.  Lymphadenopathy:     Cervical: No cervical adenopathy.     Upper Body:     Right upper body: No supraclavicular or axillary adenopathy.  Left upper body: No supraclavicular or axillary adenopathy.     Lower Body: No right inguinal adenopathy. No left inguinal adenopathy.  Skin:    General: Skin is warm and dry.     Coloration: Skin is not jaundiced.     Findings: No rash.  Neurological:     Mental Status: He is alert and oriented to person, place, and time.     Cranial Nerves: No cranial nerve deficit.  Psychiatric:        Mood and Affect: Mood normal.        Behavior: Behavior normal.        Thought Content: Thought content normal.    LABS:      Latest Ref Rng & Units 04/26/2022   12:00 AM 04/05/2022   12:00 AM 03/29/2022   12:00 AM  CBC  WBC  8.3     7.1     6.4      Hemoglobin 13.5 - 17.5 11.1      10.2     9.9      Hematocrit 41 - 53 34     32     31      Platelets 150 - 400 K/uL 119     150     144         This result is from an external source.      Latest Ref Rng & Units 04/26/2022   12:00 AM 04/05/2022   12:00 AM 03/29/2022   12:00 AM  CMP  BUN 4 - _0 Creatinine 0.6 - 1.3 1.7     1.1     1.8      Sodium 137 - 147 139     140     138      Potassium 3.5 - 5.1 mEq/L 4.3     4.8     4.9      Chloride 99 - 108 104     108     105      CO2 13 - _1 Calcium 8.7 - 10.7 9.8     9.3     9.3      Alkaline Phos 25 - 125 61     50     55      AST 14 - 40 _2 ALT 10 - 40 U/L _3 This result is from an external source.     No results found for: "CEA1", "CEA" / No results found for: "CEA1", "CEA" No results found for: "PSA1" No results found for: "GZF582" No results found for: "CAN125"  No results found for: "TOTALPROTELP", "ALBUMINELP", "A1GS", "A2GS", "BETS", "BETA2SER", "GAMS", "MSPIKE", "SPEI" Lab Results  Component Value Date   TIBC 293 02/23/2022   TIBC 307 08/10/2021   FERRITIN 151 02/23/2022   FERRITIN 220 08/10/2021   IRONPCTSAT 16 (L) 02/23/2022   IRONPCTSAT 15 (L) 08/10/2021   No results found for: "LDH"  No results found for: "AFPTUMOR", "TOTALPROTELP", "ALBUMINELP", "A1GS", "A2GS", "BETS", "BETA2SER", "GAMS", "MSPIKE", "SPEI", "LDH", "CEA1", "CEA", "PSA1", "IGASERUM", "IGGSERUM", "IGMSERUM", "THGAB", "THYROGLB"  Review Flowsheet  Latest Ref Rng & Units 08/10/2021 02/23/2022  Oncology Labs  Ferritin 24 - 336 ng/mL 220  151   %SAT 17.9 - 39.5 % 15  16      STUDIES:  No results found.    ASSESSMENT & PLAN:   Assessment/Plan:  81 y.o. male with  metastatic FGFR2 mutation positive cholangiocarcinoma.  The patient brings to my attention that he has just received his 4th cycle of oral pemigatinib, which he knows to begin taking.  He is already scheduled to undergo monthly  optical coherence tomography (OCT) testing to ensure his pemigatinib therapy is not causing any retinal damage, however, he does not recall having this done, so we will contact Dr. Mechele Claude office.  Also, we had planned for an abdominal MRI to be done prior to his visit today, but this is not scheduled until August 7.  As long as this imaging does not reveal evidence of progression, he will proceed with 5th cycle of pemigatinib.  We will see this patient back in 3 weeks before he heads into his 6th cycle of pemigatinib.  The patient understands all the plans discussed today and is in agreement with them.  He knows to contact our office if he develops concerns prior to hisnext appointment    Marvia Pickles, PA-C

## 2022-04-27 ENCOUNTER — Ambulatory Visit: Payer: Medicare Other

## 2022-05-01 ENCOUNTER — Telehealth: Payer: Self-pay

## 2022-05-01 ENCOUNTER — Encounter: Payer: Self-pay | Admitting: Oncology

## 2022-05-01 NOTE — Telephone Encounter (Addendum)
I called pt to see how he is doing. He is to start the 4th cycle of Pemazyre today, 05/01/2022. Pt states he is doing alright. He admits to intermittent nausea, intermittent constipation, and his mouth being sore He has antiemetics to take as needed - it helps per pt. He takes stool softeners about everyday he states. He denies fevers. No missed doses. I reminded him to call us if he develops temp of 100.4 or higher. He verbalized understanding. [name] is @ [employer] today. She helps him with his medications.

## 2022-05-07 LAB — HM DIABETES EYE EXAM

## 2022-05-10 ENCOUNTER — Inpatient Hospital Stay: Payer: Medicare Other

## 2022-05-10 ENCOUNTER — Other Ambulatory Visit: Payer: Self-pay | Admitting: Pharmacist

## 2022-05-10 DIAGNOSIS — R6889 Other general symptoms and signs: Secondary | ICD-10-CM

## 2022-05-10 LAB — CBC AND DIFFERENTIAL
HCT: 35 — AB (ref 41–53)
Hemoglobin: 11.2 — AB (ref 13.5–17.5)
Neutrophils Absolute: 4.9
Platelets: 124 10*3/uL — AB (ref 150–400)
WBC: 7.1

## 2022-05-10 LAB — HEPATIC FUNCTION PANEL
ALT: 23 U/L (ref 10–40)
AST: 29 (ref 14–40)
Alkaline Phosphatase: 63 (ref 25–125)
Bilirubin, Total: 0.7

## 2022-05-10 LAB — BASIC METABOLIC PANEL
BUN: 24 — AB (ref 4–21)
CO2: 28 — AB (ref 13–22)
Chloride: 104 (ref 99–108)
Creatinine: 1.6 — AB (ref 0.6–1.3)
Glucose: 107
Potassium: 4.3 mEq/L (ref 3.5–5.1)
Sodium: 139 (ref 137–147)

## 2022-05-10 LAB — CBC: RBC: 3.9 (ref 3.87–5.11)

## 2022-05-10 LAB — COMPREHENSIVE METABOLIC PANEL
Albumin: 3.9 (ref 3.5–5.0)
Calcium: 10.1 (ref 8.7–10.7)

## 2022-05-11 ENCOUNTER — Ambulatory Visit: Payer: Medicare Other

## 2022-05-22 ENCOUNTER — Ambulatory Visit: Payer: Medicare Other | Admitting: Cardiology

## 2022-05-23 NOTE — Progress Notes (Deleted)
[facility]  [address] [place],  [place]  [address] [phone]  Clinic Day:  05/23/2022  Referring physician: Bonnita Nasuti, MD   HISTORY OF PRESENT ILLNESS:  The patient is a 81 y.o. 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  Due to scans showing disease progression, which was also proven per a peritoneal biopsy, the patient is currently on pemigatinib.  He also receives Retacrit every 2 weeks to keep his hemoglobin at or above 10.  He comes in today to be evaluated before heading into his 5th cycle of therapy.  He states this is due to start on August 8.  Prior to his 4th cycle, he presented with confusion and hallucinations.  CT head did not reveal any intracranial pathology.  Overall, the patient claims to be doing well except for taste changes and decreased appetite.  He does not like nutritional supplements.  He denies recurrent hallucinations and continues to be clearer and thinking in his decision-making.  As it pertains to his disease, he denies having any abdominal pain or other symptoms that concern him for disease progression while on pemigatinib.  He states he had recent cataract surgery of the right eye.  He denies seeing the ophthalmologist on a regular basis.  PHYSICAL EXAM:  There were no vitals taken for this visit. Wt Readings from Last 3 Encounters:  04/26/22 226 lb 6.4 oz (102.7 kg)  04/06/22 230 lb 11.2 oz (104.6 kg)  04/05/22 229 lb (103.9 kg)   There is no height or weight on file to calculate BMI.  Performance status (ECOG): 1 - Symptomatic but completely ambulatory  Physical Exam Vitals and nursing note reviewed.  Constitutional:      General: He is not in acute distress.    Appearance: Normal appearance. He is normal weight.  HENT:     Head: Normocephalic and atraumatic.     Mouth/Throat:     Mouth:  Mucous membranes are moist.     Pharynx: Oropharynx is clear. No oropharyngeal exudate or posterior oropharyngeal erythema.  Eyes:     General: No scleral icterus.    Extraocular Movements: Extraocular movements intact.     Conjunctiva/sclera: Conjunctivae normal.     Pupils: Pupils are equal, round, and reactive to light.  Cardiovascular:     Rate and Rhythm: Normal rate and regular rhythm.     Heart sounds: Normal heart sounds. No murmur heard.    No friction rub. No gallop.  Pulmonary:     Effort: Pulmonary effort is normal.     Breath sounds: Normal breath sounds. No wheezing, rhonchi or rales.  Abdominal:     General: Bowel sounds are normal. There is no distension.     Palpations: Abdomen is soft. There is no hepatomegaly, splenomegaly or mass.     Tenderness: There is no abdominal tenderness.  Musculoskeletal:        General: Normal range of motion.     Cervical back: Normal range of motion and neck supple. No tenderness.     Right lower leg: No edema.     Left lower leg: No edema.  Lymphadenopathy:     Cervical: No cervical adenopathy.     Upper Body:     Right upper body: No supraclavicular or axillary adenopathy.     Left upper body: No supraclavicular or axillary adenopathy.     Lower Body: No right  inguinal adenopathy. No left inguinal adenopathy.  Skin:    General: Skin is warm and dry.     Coloration: Skin is not jaundiced.     Findings: No rash.  Neurological:     Mental Status: He is alert and oriented to person, place, and time.     Cranial Nerves: No cranial nerve deficit.  Psychiatric:        Mood and Affect: Mood normal.        Behavior: Behavior normal.        Thought Content: Thought content normal.    LABS:      Latest Ref Rng & Units 05/10/2022   12:00 AM 04/26/2022   12:00 AM 04/05/2022   12:00 AM  CBC  WBC  7.1     8.3     7.1      Hemoglobin 13.5 - 17.5 11.2     11.1     10.2      Hematocrit 41 - 53 35     34     32      Platelets 150 - 400  K/uL 124     119     150         This result is from an external source.       Latest Ref Rng & Units 05/10/2022   12:00 AM 04/26/2022   12:00 AM 04/05/2022   12:00 AM  CMP  BUN 4 - _0 Creatinine 0.6 - 1.3 1.6     1.7     1.1      Sodium 137 - 147 139     139     140      Potassium 3.5 - 5.1 mEq/L 4.3     4.3     4.8      Chloride 99 - 108 104     104     108      CO2 13 - _1 Calcium 8.7 - 10.7 10.1     9.8     9.3      Alkaline Phos 25 - 125 63     61     50      AST 14 - 40 _2 ALT 10 - 40 U/L _3 This result is from an external source.      No results found for: "CEA1", "CEA" / No results found for: "CEA1", "CEA" No results found for: "PSA1" No results found for: "ION629" No results found for: "CAN125"  No results found for: "TOTALPROTELP", "ALBUMINELP", "A1GS", "A2GS", "BETS", "BETA2SER", "GAMS", "MSPIKE", "SPEI" Lab Results  Component Value Date   TIBC 293 02/23/2022   TIBC 307 08/10/2021   FERRITIN 151 02/23/2022   FERRITIN 220 08/10/2021   IRONPCTSAT 16 (L) 02/23/2022   IRONPCTSAT 15 (L) 08/10/2021   No results found for: "LDH"  No results found for: "AFPTUMOR", "TOTALPROTELP", "ALBUMINELP", "A1GS", "A2GS", "BETS", "BETA2SER", "GAMS", "MSPIKE", "SPEI", "LDH", "CEA1", "CEA", "PSA1", "IGASERUM", "IGGSERUM", "IGMSERUM", "THGAB", "THYROGLB"  Review Flowsheet       Latest Ref Rng & Units 08/10/2021 02/23/2022  Oncology Labs  Ferritin 24 -  336 ng/mL 220  151   %SAT 17.9 - 39.5 % 15  16     STUDIES:  His abdominal MRI done on 04-30-22 revealed the following:  FINDINGS: Lower chest: No acute abnormality. Postoperative changes from previous left hepatectomy.  Hepatobiliary: Status post left hepatectomy. Previously characterized cluster of enhancing liver metastasis within segment 4a are again noted (suboptimally visualized on CT from 02/13/22). Using the same measuring technique as  the previous MRI from 12/27/2021 this cluster measures 4.4 x 1.6 cm, image 17/8. On the previous exam this measured 5.2 x 2.6 cm.  The previously noted index lesion within the posterior aspect segment 7 is not confidently identified on today's study  Index lesion within the posterior dome of right hepatic lobe measures 5 mm, image 14/8. This is compared with 7 mm on the previous MR from 02/13/2022.  No new or progressive disease.  Pancreas: No mass, inflammatory changes, or other parenchymal abnormality identified.  Spleen: Within normal limits in size and appearance.  Adrenals/Urinary Tract: No masses identified. No evidence of hydronephrosis.  Stomach/Bowel: Enhancing mass adjacent to the distal descending colon is identified on coronal image 57 of series 11. This measures 6.2 x 3.1 cm. On 02/13/2022 this measured 7.2 x 3.5 cm.  Mass within the ventral aspect of the lower abdomen is partially visualized on the coronal sequences only. This measures 6.3 x 6.0 cm previously this measured 6.1 x 6.5 cm.  Vascular/Lymphatic: Aortic atherosclerosis. No aneurysm. No abdominal adenopathy identified.  Other: None.  Musculoskeletal: No suspicious bone lesions identified.  IMPRESSION: 1. Enhancing liver lesions along the resection margin within segment 4a have decreased in size when compared with the MR from 12/27/2021. These lesions were suboptimally visualized by CT on 02/13/2022. 2. Enhancing peritoneal lesions within the ventral abdomen and left lower quadrant of the abdomen are mildly decreased in size compared with the CT from 02/13/2022. 3. No new sites of disease identified.   ASSESSMENT & PLAN:   Assessment/Plan:  81 y.o. male with  metastatic FGFR2 mutation positive cholangiocarcinoma.  The patient brings to my attention that he has just received his 4th cycle of oral pemigatinib, which he knows to begin taking.  He is already scheduled to undergo monthly optical  coherence tomography (OCT) testing to ensure his pemigatinib therapy is not causing any retinal damage, however, he does not recall having this done, so we will contact Dr. Mechele Claude office.  Also, we had planned for an abdominal MRI to be done prior to his visit today, but this is not scheduled until August 7.  As long as this imaging does not reveal evidence of progression, he will proceed with 5th cycle of pemigatinib.  We will see this patient back in 3 weeks before he heads into his 6th cycle of pemigatinib.  The patient understands all the plans discussed today and is in agreement with them.  He knows to contact our office if he develops concerns prior to hisnext appointment    Estella Malatesta Macarthur Critchley, MD

## 2022-05-24 ENCOUNTER — Encounter: Payer: Self-pay | Admitting: Oncology

## 2022-05-24 ENCOUNTER — Inpatient Hospital Stay: Payer: Medicare Other

## 2022-05-24 ENCOUNTER — Ambulatory Visit: Payer: Medicare Other | Admitting: Oncology

## 2022-05-25 ENCOUNTER — Ambulatory Visit: Payer: Medicare Other

## 2022-05-28 ENCOUNTER — Other Ambulatory Visit: Payer: Self-pay | Admitting: Hematology and Oncology

## 2022-05-28 DIAGNOSIS — T451X5A Adverse effect of antineoplastic and immunosuppressive drugs, initial encounter: Secondary | ICD-10-CM

## 2022-05-28 NOTE — Progress Notes (Signed)
[facility]  [address] [place],  [place]  [address] [phone]  Clinic Day:  05/29/2022  Referring physician: Bonnita Nasuti, MD   HISTORY OF PRESENT ILLNESS:  The patient is a 81 y.o. 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  Due to scans showing disease progression, which was also proven per a peritoneal biopsy, the patient is currently on pemigatinib.  He has also been receiving Retacrit every 2 weeks to keep his hemoglobin at or above 10.  He comes in today to be evaluated as he is in the middle of his 6th cycle of pemigatinib.  Overall, the patient is doing okay.  However, he has had recent episodes with vertigo.  He claims his appetite has waned;  his taste has also changes over time.  As it pertains to his disease, he denies having any new GI symptoms which concern him for overt signs of disease progression.  PHYSICAL EXAM:  Blood pressure (!) 92/53, pulse 79, temperature 98.2 F (36.8 C), resp. rate 16, height _0  (1.88 m), weight 216 lb 14.4 oz (98.4 kg), SpO2 96 %. Wt Readings from Last 3 Encounters:  05/29/22 216 lb 14.4 oz (98.4 kg)  04/26/22 226 lb 6.4 oz (102.7 kg)  04/06/22 230 lb 11.2 oz (104.6 kg)   Body mass index is 27.85 kg/m.  Performance status (ECOG): 1 - Symptomatic but completely ambulatory  Physical Exam Vitals and nursing note reviewed.  Constitutional:      General: He is not in acute distress.    Appearance: Normal appearance. He is normal weight.  HENT:     Head: Normocephalic and atraumatic.     Mouth/Throat:     Mouth: Mucous membranes are moist.     Pharynx: Oropharynx is clear. No oropharyngeal exudate or posterior oropharyngeal erythema.  Eyes:     General: No scleral icterus.    Extraocular Movements: Extraocular movements intact.     Conjunctiva/sclera: Conjunctivae normal.      Pupils: Pupils are equal, round, and reactive to light.  Cardiovascular:     Rate and Rhythm: Normal rate and regular rhythm.     Heart sounds: Normal heart sounds. No murmur heard.    No friction rub. No gallop.  Pulmonary:     Effort: Pulmonary effort is normal.     Breath sounds: Normal breath sounds. No wheezing, rhonchi or rales.  Abdominal:     General: Bowel sounds are normal. There is no distension.     Palpations: Abdomen is soft. There is no hepatomegaly, splenomegaly or mass.     Tenderness: There is no abdominal tenderness.  Musculoskeletal:        General: Normal range of motion.     Cervical back: Normal range of motion and neck supple. No tenderness.     Right lower leg: No edema.     Left lower leg: No edema.  Lymphadenopathy:     Cervical: No cervical adenopathy.     Upper Body:     Right upper body: No supraclavicular or axillary adenopathy.     Left upper body: No supraclavicular or axillary adenopathy.     Lower Body: No right inguinal adenopathy. No left inguinal adenopathy.  Skin:    General: Skin is warm and dry.     Coloration: Skin is not jaundiced.     Findings: No rash.  Neurological:  Mental Status: He is alert and oriented to person, place, and time.     Cranial Nerves: No cranial nerve deficit.  Psychiatric:        Mood and Affect: Mood normal.        Behavior: Behavior normal.        Thought Content: Thought content normal.    LABS:    STUDIES:  His abdominal MRI on 04-30-22 revealed the following: FINDINGS: Lower chest: No acute abnormality. Postoperative changes from previous left hepatectomy.  Hepatobiliary: Status post left hepatectomy. Previously characterized cluster of enhancing liver metastasis within segment 4a are again noted (suboptimally visualized on CT from 02/13/22). Using the same measuring technique as the previous MRI from 12/27/2021 this cluster measures 4.4 x 1.6 cm, image 17/8. On the previous exam this measured  5.2 x 2.6 cm.  The previously noted index lesion within the posterior aspect segment 7 is not confidently identified on today's study  Index lesion within the posterior dome of right hepatic lobe measures 5 mm, image 14/8. This is compared with 7 mm on the previous MR from 02/13/2022.  No new or progressive disease.  Pancreas: No mass, inflammatory changes, or other parenchymal abnormality identified.  Spleen: Within normal limits in size and appearance.  Adrenals/Urinary Tract: No masses identified. No evidence of hydronephrosis.  Stomach/Bowel: Enhancing mass adjacent to the distal descending colon is identified on coronal image 57 of series 11. This measures 6.2 x 3.1 cm. On 02/13/2022 this measured 7.2 x 3.5 cm.  Mass within the ventral aspect of the lower abdomen is partially visualized on the coronal sequences only. This measures 6.3 x 6.0 cm previously this measured 6.1 x 6.5 cm.  Vascular/Lymphatic: Aortic atherosclerosis. No aneurysm. No abdominal adenopathy identified.  Other: None.  Musculoskeletal: No suspicious bone lesions identified.  IMPRESSION: 1. Enhancing liver lesions along the resection margin within segment 4a have decreased in size when compared with the MR from 12/27/2021. These lesions were suboptimally visualized by CT on 02/13/2022. 2. Enhancing peritoneal lesions within the ventral abdomen and left lower quadrant of the abdomen are mildly decreased in size compared with the CT from 02/13/2022. 3. No new sites of disease identified.   ASSESSMENT & PLAN:  Assessment/Plan:  81 y.o. male with known metastatic FGFR2 mutation positive cholangiocarcinoma.  He is currently in the middle of his 6th cycle of pemigatinib.  In clinic today, I did go over his MRI images with him, for which he could see that the disease in his liver and in his left lower pelvic region has decreased in size.  This likely reflects the efficacy of of his pemigatinib on his FGFR  mutation.  He knows this medicine will be continued until there is evidence of disease progression or significant intolerability.  With respect to his decreased appetite, I will prescribe him Megace 800 mg daily.  Otherwise, I will see this patient in 3 weeks before he heads into his 7th cycle of pemigatinib.  The patient understands all the plans discussed today and is in agreement with them.  Sebastyan Snodgrass Macarthur Critchley, MD

## 2022-05-29 ENCOUNTER — Other Ambulatory Visit: Payer: Self-pay | Admitting: Oncology

## 2022-05-29 ENCOUNTER — Encounter: Payer: Self-pay | Admitting: Oncology

## 2022-05-29 ENCOUNTER — Inpatient Hospital Stay: Payer: Medicare Other | Attending: Oncology

## 2022-05-29 ENCOUNTER — Inpatient Hospital Stay (INDEPENDENT_AMBULATORY_CARE_PROVIDER_SITE_OTHER): Payer: Medicare Other | Admitting: Oncology

## 2022-05-29 VITALS — BP 92/53 | HR 79 | Temp 98.2°F | Resp 16 | Ht 74.0 in | Wt 216.9 lb

## 2022-05-29 DIAGNOSIS — C240 Malignant neoplasm of extrahepatic bile duct: Secondary | ICD-10-CM | POA: Diagnosis not present

## 2022-05-29 DIAGNOSIS — I959 Hypotension, unspecified: Secondary | ICD-10-CM | POA: Insufficient documentation

## 2022-05-29 DIAGNOSIS — C786 Secondary malignant neoplasm of retroperitoneum and peritoneum: Secondary | ICD-10-CM | POA: Insufficient documentation

## 2022-05-29 DIAGNOSIS — R6889 Other general symptoms and signs: Secondary | ICD-10-CM

## 2022-05-29 DIAGNOSIS — C221 Intrahepatic bile duct carcinoma: Secondary | ICD-10-CM | POA: Insufficient documentation

## 2022-05-29 LAB — CBC: RBC: 3.85 — AB (ref 3.87–5.11)

## 2022-05-29 LAB — BASIC METABOLIC PANEL
BUN: 26 — AB (ref 4–21)
CO2: 30 — AB (ref 13–22)
Chloride: 105 (ref 99–108)
Creatinine: 1.5 — AB (ref 0.6–1.3)
Potassium: 3.9 mEq/L (ref 3.5–5.1)
Sodium: 142 (ref 137–147)

## 2022-05-29 LAB — HEPATIC FUNCTION PANEL
ALT: 33 U/L (ref 10–40)
AST: 44 — AB (ref 14–40)
Alkaline Phosphatase: 56 (ref 25–125)
Bilirubin, Total: 0.7

## 2022-05-29 LAB — CBC AND DIFFERENTIAL
HCT: 34 — AB (ref 41–53)
Hemoglobin: 11.3 — AB (ref 13.5–17.5)
Neutrophils Absolute: 5.29
Platelets: 133 10*3/uL — AB (ref 150–400)
WBC: 7.9

## 2022-05-29 LAB — COMPREHENSIVE METABOLIC PANEL
Albumin: 3.7 (ref 3.5–5.0)
Calcium: 9.4 (ref 8.7–10.7)

## 2022-05-29 MED ORDER — MEGESTROL ACETATE 400 MG/10ML PO SUSP
800.0000 mg | Freq: Every day | ORAL | 0 refills | Status: DC
Start: 2022-05-29 — End: 2023-02-12

## 2022-05-31 ENCOUNTER — Ambulatory Visit: Payer: Medicare Other

## 2022-06-18 NOTE — Progress Notes (Unsigned)
[facility]  [address] [place],  [place]  [address] [phone]  Clinic Day:  06/19/2022  Referring physician: Bonnita Nasuti, MD   HISTORY OF PRESENT ILLNESS:  The patient is a 81 y.o. 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  The patient is currently on pemigatinib for his disease management.  He also receives Retacrit every few weeks to keep his hemoglobin at or above 10.  He comes in today to be evaluated as he is in the middle of his 7th cycle of pemigatinib.  Overall, the patient is doing okay.  However, he has had more lightheadedness recently.  In speaking with his family, they are concerned with his decrease in appetite.  He actually comes to clinic today in a wheelchair as he was lightheaded while getting out of his car. As it pertains to his disease, he denies having any new GI symptoms which concern him for overt signs of disease progression.  He does have mild nausea with his pemigatinib therapy, but does not believe that it is significant enough to where the dose needs to be reduced.  PHYSICAL EXAM:  Blood pressure (!) 79/51, pulse 77, temperature 97.7 F (36.5 C), resp. rate 16, height $RemoveBe'6\' 2"'NkNwNsQJs$  (1.88 m), weight 218 lb 1.6 oz (98.9 kg), SpO2 95 %. Wt Readings from Last 3 Encounters:  06/19/22 218 lb 1.6 oz (98.9 kg)  05/29/22 216 lb 14.4 oz (98.4 kg)  04/26/22 226 lb 6.4 oz (102.7 kg)   Body mass index is 28 kg/m.  Performance status (ECOG): 1 - Symptomatic but completely ambulatory  Physical Exam Vitals and nursing note reviewed.  Constitutional:      General: He is not in acute distress.    Appearance: Normal appearance. He is normal weight.     Comments: He looks pale and has a chronically ill appearance.  He is in a wheelchair.  HENT:     Head: Normocephalic and atraumatic.     Mouth/Throat:     Mouth: Mucous  membranes are moist.     Pharynx: Oropharynx is clear. No oropharyngeal exudate or posterior oropharyngeal erythema.  Eyes:     General: No scleral icterus.    Extraocular Movements: Extraocular movements intact.     Conjunctiva/sclera: Conjunctivae normal.     Pupils: Pupils are equal, round, and reactive to light.  Cardiovascular:     Rate and Rhythm: Normal rate and regular rhythm.     Heart sounds: Normal heart sounds. No murmur heard.    No friction rub. No gallop.  Pulmonary:     Effort: Pulmonary effort is normal.     Breath sounds: Normal breath sounds. No wheezing, rhonchi or rales.  Abdominal:     General: Bowel sounds are normal. There is no distension.     Palpations: Abdomen is soft. There is no hepatomegaly, splenomegaly or mass.     Tenderness: There is no abdominal tenderness.  Musculoskeletal:        General: Normal range of motion.     Cervical back: Normal range of motion and neck supple. No tenderness.     Right lower leg: No edema.     Left lower leg: No edema.  Lymphadenopathy:     Cervical: No cervical adenopathy.     Upper Body:     Right upper body: No supraclavicular or axillary adenopathy.     Left upper body:  No supraclavicular or axillary adenopathy.     Lower Body: No right inguinal adenopathy. No left inguinal adenopathy.  Skin:    General: Skin is warm and dry.     Coloration: Skin is not jaundiced.     Findings: No rash.  Neurological:     Mental Status: He is alert and oriented to person, place, and time.     Cranial Nerves: No cranial nerve deficit.  Psychiatric:        Mood and Affect: Mood normal.        Behavior: Behavior normal.        Thought Content: Thought content normal.    LABS:  Latest Reference Range & Units 06/19/22 00:00  Sodium 137 - 147  138 (E)  Potassium 3.5 - 5.1 mEq/L 4.4 (E)  Chloride 99 - 108  104 (E)  CO2 13 - 22  26 ! (E)  Glucose  114 (E)  BUN 4 - 21  31 ! (E)  Creatinine 0.6 - 1.3  2.1 ! (E)  Calcium 8.7 -  10.7  10.0 (E)  Alkaline Phosphatase 25 - 125  63 (E)  Albumin 3.5 - 5.0  3.8 (E)  AST 14 - 40  40 (E)  ALT 10 - 40 U/L 32 (E)  Bilirubin, Total  0.8 (E)  !: Data is abnormal (E): External lab result  Latest Reference Range & Units 06/19/22 00:00  WBC  9.0 (E)  RBC 3.87 - 5.11  3.64 ! (E)  Hemoglobin 13.5 - 17.5  10.6 ! (E)  HCT 41 - 53  32 ! (E)  Platelets 150 - 400 K/uL 149 ! (E)  NEUT#  5.76 (E)  !: Data is abnormal (E): External lab result  ASSESSMENT & PLAN:  Assessment/Plan:   A 81 y.o. male with known metastatic FGFR2 mutation positive cholangiocarcinoma.  He is currently in the middle of his 7th cycle of pemigatinib.  In clinic today, I was very concerned with his hypotension and overall weakness.  In clinic today, he was given 1 L of IV fluids, which led to his systolic blood pressure improving by 30 points.  I did talk to his son over the phone today and recommended that there be a family discussion about having more supervision for this patient throughout the day.  My concern is that in his current health, he may be found lying on the floor unconscious if someone is not keeping a constant, vigilant on him.  I will also have home health go out to assess him to see what other services he may need to ensure his daily quality of life does not abruptly fall while at home.  We also spoke with his primary care office and told him about his hypotension.  I do believe his blood pressure medicine needs to be decreased, if not stopped altogether.  I will rely upon his primary care office to make this decision.  Otherwise, I will see this patient back in 3 weeks as he heads into his eighth cycle of oral pemigatinib therapy.  The patient understands all the plans discussed today and is in agreement with them.  Tammie Yanda Macarthur Critchley, MD

## 2022-06-19 ENCOUNTER — Inpatient Hospital Stay (INDEPENDENT_AMBULATORY_CARE_PROVIDER_SITE_OTHER): Payer: Medicare Other | Admitting: Oncology

## 2022-06-19 ENCOUNTER — Inpatient Hospital Stay: Payer: Medicare Other

## 2022-06-19 ENCOUNTER — Other Ambulatory Visit: Payer: Self-pay | Admitting: Hematology and Oncology

## 2022-06-19 VITALS — BP 110/56 | HR 57 | Temp 97.7°F | Resp 16 | Ht 74.0 in | Wt 218.1 lb

## 2022-06-19 DIAGNOSIS — D6481 Anemia due to antineoplastic chemotherapy: Secondary | ICD-10-CM

## 2022-06-19 DIAGNOSIS — T451X5A Adverse effect of antineoplastic and immunosuppressive drugs, initial encounter: Secondary | ICD-10-CM

## 2022-06-19 DIAGNOSIS — I959 Hypotension, unspecified: Secondary | ICD-10-CM | POA: Diagnosis not present

## 2022-06-19 DIAGNOSIS — C240 Malignant neoplasm of extrahepatic bile duct: Secondary | ICD-10-CM

## 2022-06-19 DIAGNOSIS — C786 Secondary malignant neoplasm of retroperitoneum and peritoneum: Secondary | ICD-10-CM | POA: Diagnosis not present

## 2022-06-19 DIAGNOSIS — C221 Intrahepatic bile duct carcinoma: Secondary | ICD-10-CM | POA: Diagnosis present

## 2022-06-19 LAB — BASIC METABOLIC PANEL
BUN: 31 — AB (ref 4–21)
CO2: 26 — AB (ref 13–22)
Chloride: 104 (ref 99–108)
Creatinine: 2.1 — AB (ref 0.6–1.3)
Glucose: 114
Potassium: 4.4 mEq/L (ref 3.5–5.1)
Sodium: 138 (ref 137–147)

## 2022-06-19 LAB — CBC AND DIFFERENTIAL
HCT: 32 — AB (ref 41–53)
Hemoglobin: 10.6 — AB (ref 13.5–17.5)
Neutrophils Absolute: 5.76
Platelets: 149 10*3/uL — AB (ref 150–400)
WBC: 9

## 2022-06-19 LAB — COMPREHENSIVE METABOLIC PANEL
Albumin: 3.8 (ref 3.5–5.0)
Calcium: 10 (ref 8.7–10.7)

## 2022-06-19 LAB — CBC: RBC: 3.64 — AB (ref 3.87–5.11)

## 2022-06-19 LAB — HEPATIC FUNCTION PANEL
ALT: 32 U/L (ref 10–40)
AST: 40 (ref 14–40)
Alkaline Phosphatase: 63 (ref 25–125)
Bilirubin, Total: 0.8

## 2022-06-19 MED ORDER — SODIUM CHLORIDE 0.9% FLUSH: 10.0000 mL | Freq: Once | INTRAVENOUS | Status: DC | PRN

## 2022-06-19 MED ORDER — SODIUM CHLORIDE 0.9% FLUSH
10.0000 mL | INTRAVENOUS | Status: DC | PRN
  Administered 2022-06-19: 10 mL

## 2022-06-19 MED ORDER — EPOETIN ALFA-EPBX 20000 UNIT/ML IJ SOLN: 20000.0000 [IU] | Freq: Once | INTRAMUSCULAR | Status: DC

## 2022-06-19 MED ORDER — SODIUM CHLORIDE 0.9 % IV SOLN: Freq: Once | INTRAVENOUS | Status: AC

## 2022-06-19 MED ORDER — ALTEPLASE 2 MG IJ SOLR: 2.0000 mg | Freq: Once | INTRAMUSCULAR | Status: DC | PRN

## 2022-06-20 ENCOUNTER — Encounter: Payer: Self-pay | Admitting: Oncology

## 2022-06-21 ENCOUNTER — Ambulatory Visit: Payer: Medicare Other

## 2022-06-29 ENCOUNTER — Ambulatory Visit: Payer: Medicare Other | Attending: Cardiology | Admitting: Cardiology

## 2022-06-29 ENCOUNTER — Encounter: Payer: Self-pay | Admitting: Cardiology

## 2022-06-29 VITALS — BP 84/55 | HR 84 | Ht 74.0 in | Wt 212.8 lb

## 2022-06-29 DIAGNOSIS — C240 Malignant neoplasm of extrahepatic bile duct: Secondary | ICD-10-CM | POA: Insufficient documentation

## 2022-06-29 DIAGNOSIS — I255 Ischemic cardiomyopathy: Secondary | ICD-10-CM | POA: Diagnosis present

## 2022-06-29 DIAGNOSIS — I10 Essential (primary) hypertension: Secondary | ICD-10-CM | POA: Diagnosis present

## 2022-06-29 DIAGNOSIS — I214 Non-ST elevation (NSTEMI) myocardial infarction: Secondary | ICD-10-CM | POA: Insufficient documentation

## 2022-06-29 DIAGNOSIS — I5042 Chronic combined systolic (congestive) and diastolic (congestive) heart failure: Secondary | ICD-10-CM | POA: Diagnosis present

## 2022-06-29 NOTE — Progress Notes (Signed)
Cardiology Office Note:    Date:  06/29/2022   ID:  Gary [name], DOB Aug 24, 1941, MRN [id number]  PCP:  Bonnita Nasuti, MD  Cardiologist:  Jenne Campus, MD    Referring MD: Bonnita Nasuti, MD   No chief complaint on file. I am not feeling well  History of Present Illness:    Gary [name] is a 81 y.o. male    with past medical history significant for coronary artery disease, in July 11, 2020 he did have cardiac catheterization in face of acute non-STEMI.  He was find to have multiple lesions including the culprit lesion which was the mid and distal portion of the circumflex artery.  That was addressed with drug-eluting stent.  He was put on dual antiplatelets therapy and discharged home.  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  Etiology of this cardiomyopathy is at least partially ischemic. Comes today 2 months for follow-up.  Overall not doing well he is pale he is tired he is exhausted this is not new he has been feeling that way for weeks and months he is mildly anemic based on last check which was in September, his blood pressure very low.  Denies have any passing out.  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[facility])    Cardiomegaly 07/29/2020   Cardiomyopathy ([facility]) 04/24/2019   CHF (congestive heart failure) ([facility])    Cholangiocarcinoma of liver ([facility]) 12/29/2017   Chronic obstructive pulmonary disease ([facility]) 12/13/2020   Chronic pain syndrome 8/52/7782   Chronic systolic heart failure ([facility]) 12/13/2020   Coronary artery disease with PTCA and stenting of mid and distal circumflex artery on 07/11/2020 42/11/5359   Diastolic congestive heart failure ([facility])  04/24/2019   Dizziness 08/30/2020   Dyspnea on exertion 04/24/2019   Encounter for therapeutic drug level monitoring 06/15/2020   Essential hypertension 04/24/2019   Gastro-esophageal reflux disease without esophagitis 12/13/2020   Generalized anxiety disorder 12/13/2020   Hyperglycemia due to type 2 diabetes mellitus ([facility]) 12/13/2020   Hypertension    Hypothyroidism 12/13/2020   Liver tumor 12/27/2017   Formatting of this note might be different from the original. 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[facility]) 12/13/2020   Mixed hyperlipidemia 12/13/2020   NSTEMI (non-ST elevated myocardial infarction) ([facility]) 07/09/2020   Orthostatic hypotension 10/20/2020   Other long term (current) drug therapy 12/13/2020   Other vitamin B12 deficiency anemias 12/13/2020   Pain in left hip 12/13/2020   Preop cardiovascular exam 06/15/2020   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/30/2020   Type 2 diabetes mellitus without complications ([facility]) 4/43/1540   Unsteadiness on feet 08/30/2020   UTI (urinary tract infection) 01/08/2018   Vitamin D deficiency 12/13/2020    Past Surgical History:  Procedure Laterality Date   BILE DUCT EXPLORATION     To remove cancer   CORONARY STENT INTERVENTION N/A 07/11/2020   Procedure: CORONARY STENT INTERVENTION;  Surgeon: Leonie Man, MD;  Location: [facility];  Service:  Cardiovascular;  Laterality: N/A;   LEFT HEART CATH AND CORONARY ANGIOGRAPHY N/A 07/11/2020   Procedure: LEFT HEART CATH AND CORONARY ANGIOGRAPHY;  Surgeon: Leonie Man, MD;  Location: [facility];  Service: Cardiovascular;  Laterality: N/A;   MOHS SURGERY     Basil cell on nose    Current Medications: Current Meds  Medication Sig   ALPRAZolam (XANAX) 0.5 MG tablet Take 0.5 mg  by mouth 3 (three) times daily as needed for anxiety.    aspirin EC 81 MG tablet Take 1 tablet (81 mg total) by mouth daily. Swallow whole.   atorvastatin (LIPITOR) 10 MG tablet 1 tablet   buPROPion (WELLBUTRIN XL) 300 MG 24 hr tablet Take 1 tablet by mouth daily. For 90 days   calcium carbonate (TUMS - DOSED IN MG ELEMENTAL CALCIUM) 500 MG chewable tablet Chew 1 tablet by mouth in the morning and at bedtime. Total of 1200 mg daily   cholecalciferol (VITAMIN D3) 25 MCG (1000 UNIT) tablet Take 1,000 Units by mouth daily.   clopidogrel (PLAVIX) 75 MG tablet Take 75 mg by mouth daily.   ferrous sulfate 325 (65 FE) MG tablet 1 tablet   folic acid (FOLVITE) 1 MG tablet Take 1 tablet (1 mg total) by mouth daily.   HYDROcodone-acetaminophen (NORCO) 10-325 MG tablet 1 tablet as needed   ketoconazole (NIZORAL) 2 % cream 1 application   losartan (COZAAR) 25 MG tablet Take 1 tablet by mouth in the morning and at bedtime.   magnesium citrate SOLN Take 1 Bottle by mouth once. Pt to take 1/2 bottle. If no results in 1hr, take the other 1/2.   montelukast (SINGULAIR) 10 MG tablet Take 1 tablet by mouth daily.   Multiple Vitamin (MULTIVITAMIN) tablet Take 1 tablet by mouth daily. Unknown Strength per patient   nitroGLYCERIN (NITROSTAT) 0.4 MG SL tablet Place 1 tablet (0.4 mg total) under the tongue every 5 (five) minutes as needed for chest pain.   Omega-3 Fatty Acids (FISH OIL) 1200 MG CPDR Take 1 tablet by mouth daily.   omeprazole (PRILOSEC) 40 MG capsule Take 1 capsule by mouth daily as needed (Acid reflux).   ondansetron (ZOFRAN) 4 MG tablet TAKE 1 TABLET BY MOUTH EVERY 4 HOURS AS NEEDED FOR NAUSEA OR VOMITING   pantoprazole (PROTONIX) 40 MG tablet Take 40 mg by mouth daily.   pemigatinib (PEMAZYRE) 4.5 MG tablet 3 tabs po daily. Take for 14 days on, 7 days off, repeat every 21d. (Patient taking differently: 13.5 mg. 1 tab po daily. Take for 14 days on, 7 days off, repeat every 21d.)   sacubitril-valsartan  (ENTRESTO) 24-26 MG Take 1 tablet by mouth daily. Once a day per patient   tamsulosin (FLOMAX) 0.4 MG CAPS capsule Take 0.4 mg by mouth daily.    vitamin B-12 (CYANOCOBALAMIN) 250 MCG tablet Take 250 mcg by mouth daily.   Vitamin D, Ergocalciferol, 50 MCG (2000 UT) CAPS Take 1 tablet by mouth daily.     Allergies:   Heparin, Pork-derived products, and Amoxicillin   Social History   Socioeconomic History   Marital status: Widowed    Spouse name: Not on file   Number of children: Not on file   Years of education: Not on file   Highest education level: Not on file  Occupational History   Not on file  Tobacco Use   Smoking status: Never   Smokeless tobacco: Current    Types: Chew  Substance and Sexual Activity   Alcohol  use: Not Currently   Drug use: Never   Sexual activity: Not on file  Other Topics Concern   Not on file  Social History Narrative   Not on file   Social Determinants of Health   Financial Resource Strain: Not on file  Food Insecurity: Not on file  Transportation Needs: Not on file  Physical Activity: Not on file  Stress: Not on file  Social Connections: Not on file     Family History: The patient's family history includes Breast cancer in his sister and sister; Cancer in his father. ROS:   Please see the history of present illness.    All 14 point review of systems negative except as described per history of present illness  EKGs/Labs/Other Studies Reviewed:      Recent Labs: 06/19/2022: ALT 32; BUN 31; Creatinine 2.1; Hemoglobin 10.6; Platelets 149; Potassium 4.4; Sodium 138  Recent Lipid Panel    Component Value Date/Time   CHOL 139 07/09/2020 0354   TRIG 255 (H) 07/09/2020 0354   HDL 33 (L) 07/09/2020 0354   CHOLHDL 4.2 07/09/2020 0354   VLDL 51 (H) 07/09/2020 0354   LDLCALC 55 07/09/2020 0354    Physical Exam:    VS:  BP (!) 84/55   Pulse 84   Ht '6\' 2"'$  (1.88 m)   Wt 212 lb 12.8 oz (96.5 kg)   SpO2 97%   BMI 27.32 kg/m     Wt  Readings from Last 3 Encounters:  06/29/22 212 lb 12.8 oz (96.5 kg)  06/19/22 218 lb 1.6 oz (98.9 kg)  05/29/22 216 lb 14.4 oz (98.4 kg)     GEN:  Well nourished, well developed in no acute distress HEENT: Normal NECK: No JVD; No carotid bruits LYMPHATICS: No lymphadenopathy CARDIAC: RRR, no murmurs, no rubs, no gallops RESPIRATORY:  Clear to auscultation without rales, wheezing or rhonchi  ABDOMEN: Soft, non-tender, non-distended MUSCULOSKELETAL:  No edema; No deformity  SKIN: Warm and dry LOWER EXTREMITIES: no swelling NEUROLOGIC:  Alert and oriented x 3 PSYCHIATRIC:  Normal affect   ASSESSMENT:    1. Ischemic cardiomyopathy   2. Essential hypertension   3. NSTEMI (non-ST elevated myocardial infarction) ([facility])   4. Primary hypertension   5. Chronic combined systolic and diastolic congestive heart failure ([facility])   6. 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  Likely hemodynamically he is compensated Essential hypertension with dealing with low blood pressure right now which is chronic complaint. History coronary artery disease looks stable from that point review Malignant tumor of extrahepatic bile duct.  To be followed by oncology I did review note by them.   Medication Adjustments/Labs and Tests Ordered: Current medicines are reviewed at length with the patient today.  Concerns regarding medicines are outlined above.  No orders of the defined types were placed in this encounter.  Medication changes: No orders of the defined types were placed in this encounter.   Signed, Park Liter, MD, [facility] 06/29/2022 10:00 AM    [facility]

## 2022-06-29 NOTE — Patient Instructions (Addendum)
Medication Instructions:  Your physician has recommended you make the following change in your medication:   STOP: Losartan    Lab Work: None Ordered If you have labs (blood work) drawn today and your tests are completely normal, you will receive your results only by: [facility] (if you have MyChart) OR A paper copy in the mail If you have any lab test that is abnormal or we need to change your treatment, we will call you to review the results.   Testing/Procedures: None Ordered   Follow-Up: At [facility], you and your health needs are our priority.  As part of our continuing mission to provide you with exceptional heart care, we have created designated Provider Care Teams.  These Care Teams include your primary Cardiologist (physician) and Advanced Practice Providers (APPs -  Physician Assistants and Nurse Practitioners) who all work together to provide you with the care you need, when you need it.  We recommend signing up for the patient portal called "MyChart".  Sign up information is provided on this After Visit Summary.  MyChart is used to connect with patients for Virtual Visits (Telemedicine).  Patients are able to view lab/test results, encounter notes, upcoming appointments, etc.  Non-urgent messages can be sent to your provider as well.   To learn more about what you can do with MyChart, go to [email or web address].    Your next appointment:   3 month(s)  The format for your next appointment:   In Person  Provider:   Jenne Campus, MD    Other Instructions NA

## 2022-07-02 ENCOUNTER — Encounter: Payer: Self-pay | Admitting: Oncology

## 2022-07-03 ENCOUNTER — Inpatient Hospital Stay: Payer: Medicare Other

## 2022-07-03 ENCOUNTER — Ambulatory Visit: Payer: Medicare Other | Admitting: Oncology

## 2022-07-03 NOTE — Progress Notes (Signed)
Gary [name]  [address] [place],  [place]  [address] [phone]  Clinic Day:  07/04/2022  Referring physician: Bonnita Nasuti, MD   HISTORY OF PRESENT ILLNESS:  The patient is a 81 y.o. 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  The patient is currently on pemigatinib for his disease management.  He also receives Retacrit every few weeks to keep his hemoglobin at or above 10.  He comes in today to be evaluated as he is in the middle of his 8th cycle of pemigatinib.  Overall, the patient's baseline health remains substandard.  His sister, who is with him today, mentions his appetite has been poor.  The patient claims that due to him being hypotensive, his carvedilol was recently held.  However, hypotension remains an issue.  As a pertains to his cholangiocarcinoma, he claims to be tolerating his pemigatinib therapy very well.  He also claims he has not had any significant problems.  He denies having any new symptoms or findings which concern him for disease progression.  PHYSICAL EXAM:  Blood pressure (!) 113/56, pulse 68, temperature 97.9 F (36.6 C), resp. rate 18, height $RemoveBe'6\' 2"'tdjoDdWEv$  (1.88 m), weight 213 lb 14.4 oz (97 kg), SpO2 98 %. Wt Readings from Last 3 Encounters:  07/04/22 213 lb 14.4 oz (97 kg)  06/29/22 212 lb 12.8 oz (96.5 kg)  06/19/22 218 lb 1.6 oz (98.9 kg)   Body mass index is 27.46 kg/m.  Performance status (ECOG): 1 - Symptomatic but completely ambulatory  Physical Exam Vitals and nursing note reviewed.  Constitutional:      General: He is not in acute distress.    Appearance: Normal appearance. He is normal weight.     Comments: He looks pale and has a chronically ill appearance.  He is in a wheelchair.  HENT:     Head: Normocephalic and atraumatic.     Mouth/Throat:     Mouth: Mucous membranes are moist.      Pharynx: Oropharynx is clear. No oropharyngeal exudate or posterior oropharyngeal erythema.  Eyes:     General: No scleral icterus.    Extraocular Movements: Extraocular movements intact.     Conjunctiva/sclera: Conjunctivae normal.     Pupils: Pupils are equal, round, and reactive to light.  Cardiovascular:     Rate and Rhythm: Normal rate and regular rhythm.     Heart sounds: Normal heart sounds. No murmur heard.    No friction rub. No gallop.  Pulmonary:     Effort: Pulmonary effort is normal.     Breath sounds: Normal breath sounds. No wheezing, rhonchi or rales.  Abdominal:     General: Bowel sounds are normal. There is no distension.     Palpations: Abdomen is soft. There is no hepatomegaly, splenomegaly or mass.     Tenderness: There is no abdominal tenderness.  Musculoskeletal:        General: Normal range of motion.     Cervical back: Normal range of motion and neck supple. No tenderness.     Right lower leg: No edema.     Left lower leg: No edema.  Lymphadenopathy:     Cervical: No cervical adenopathy.     Upper Body:     Right upper body: No supraclavicular or axillary adenopathy.     Left upper body: No supraclavicular or axillary adenopathy.     Lower Body: No  right inguinal adenopathy. No left inguinal adenopathy.  Skin:    General: Skin is warm and dry.     Coloration: Skin is not jaundiced.     Findings: No rash.  Neurological:     Mental Status: He is alert and oriented to person, place, and time.     Cranial Nerves: No cranial nerve deficit.  Psychiatric:        Mood and Affect: Mood normal.        Behavior: Behavior normal.        Thought Content: Thought content normal.    LABS:    ASSESSMENT & PLAN:  Assessment/Plan:   A 81 y.o. male with known metastatic FGFR2 mutation positive cholangiocarcinoma.  He is currently in the middle of his 8th cycle of pemigatinib.  In clinic today, I was once again very concerned with his hypotension and overall  weakness.  Furthermore, his kidney parameters have risen over time.  Due to the concerns about dehydration being behind these problems, the patient was given 1 L of IV fluids in clinic today.  His systolic blood pressure rose to 113 after receiving his 1 L of fluid.  The patient knows to finish his eighth cycle of pemigatinib oral therapy.  Due to his declining health, I will have home health do a thorough evaluation of him at home.  As his sister was with him today, I made it very clear to her and him that living by himself does not appear to be a feasible option at this point in time.  I strongly recommended that his living arrangements change as I am very concerned that he may be found in a very compromised situation if family members and friends are not checking on him more frequently over these next few weeks.  Otherwise, I will see this patient back in 3 weeks for repeat clinical assessment.  A repeat MRI of his abdomen will be done at that time to ascertain his new disease baseline after 8 cycles of oral pemigatinib treatment.  The patient understands all the plans discussed today and is in agreement with them.  Myleigh Amara Macarthur Critchley, MD

## 2022-07-04 ENCOUNTER — Inpatient Hospital Stay: Payer: Medicare Other

## 2022-07-04 ENCOUNTER — Other Ambulatory Visit: Payer: Self-pay | Admitting: Oncology

## 2022-07-04 ENCOUNTER — Inpatient Hospital Stay: Payer: Medicare Other | Attending: Oncology | Admitting: Oncology

## 2022-07-04 VITALS — BP 113/56 | HR 68 | Temp 97.9°F | Resp 18 | Ht 74.0 in | Wt 213.9 lb

## 2022-07-04 DIAGNOSIS — R6889 Other general symptoms and signs: Secondary | ICD-10-CM

## 2022-07-04 DIAGNOSIS — T451X5A Adverse effect of antineoplastic and immunosuppressive drugs, initial encounter: Secondary | ICD-10-CM | POA: Diagnosis not present

## 2022-07-04 DIAGNOSIS — D6481 Anemia due to antineoplastic chemotherapy: Secondary | ICD-10-CM | POA: Diagnosis not present

## 2022-07-04 DIAGNOSIS — I959 Hypotension, unspecified: Secondary | ICD-10-CM | POA: Insufficient documentation

## 2022-07-04 DIAGNOSIS — C786 Secondary malignant neoplasm of retroperitoneum and peritoneum: Secondary | ICD-10-CM | POA: Diagnosis not present

## 2022-07-04 DIAGNOSIS — C221 Intrahepatic bile duct carcinoma: Secondary | ICD-10-CM | POA: Insufficient documentation

## 2022-07-04 DIAGNOSIS — C240 Malignant neoplasm of extrahepatic bile duct: Secondary | ICD-10-CM

## 2022-07-04 LAB — CBC: RBC: 3.59 — AB (ref 3.87–5.11)

## 2022-07-04 LAB — COMPREHENSIVE METABOLIC PANEL
Albumin: 4 (ref 3.5–5.0)
Calcium: 10 (ref 8.7–10.7)

## 2022-07-04 LAB — BASIC METABOLIC PANEL
BUN: 38 — AB (ref 4–21)
CO2: 29 — AB (ref 13–22)
Chloride: 103 (ref 99–108)
Creatinine: 3 — AB (ref 0.6–1.3)
Glucose: 110
Potassium: 4.3 mEq/L (ref 3.5–5.1)
Sodium: 141 (ref 137–147)

## 2022-07-04 LAB — CBC AND DIFFERENTIAL
HCT: 32 — AB (ref 41–53)
Hemoglobin: 10.5 — AB (ref 13.5–17.5)
Neutrophils Absolute: 4.73
Platelets: 144 10*3/uL — AB (ref 150–400)
WBC: 7.5

## 2022-07-04 LAB — HEPATIC FUNCTION PANEL
ALT: 31 U/L (ref 10–40)
AST: 32 (ref 14–40)
Alkaline Phosphatase: 71 (ref 25–125)
Bilirubin, Total: 0.7

## 2022-07-04 MED ORDER — SODIUM CHLORIDE 0.9% FLUSH
10.0000 mL | Freq: Once | INTRAVENOUS | Status: AC | PRN
  Administered 2022-07-04: 10 mL

## 2022-07-04 MED ORDER — SODIUM CHLORIDE 0.9 % IV SOLN: Freq: Once | INTRAVENOUS | Status: AC

## 2022-07-05 ENCOUNTER — Ambulatory Visit: Payer: Medicare Other

## 2022-07-09 ENCOUNTER — Other Ambulatory Visit: Payer: Self-pay | Admitting: Oncology

## 2022-07-09 ENCOUNTER — Other Ambulatory Visit (HOSPITAL_COMMUNITY): Payer: Self-pay

## 2022-07-09 ENCOUNTER — Telehealth: Payer: Self-pay

## 2022-07-09 DIAGNOSIS — C221 Intrahepatic bile duct carcinoma: Secondary | ICD-10-CM

## 2022-07-09 MED ORDER — PEMIGATINIB 4.5 MG PO TABS: ORAL_TABLET | ORAL | 5 refills | Status: DC

## 2022-07-09 MED ORDER — PEMIGATINIB 4.5 MG PO TABS
ORAL_TABLET | ORAL | 5 refills | Status: DC
  Filled 2022-07-09: qty 42, fill #0

## 2022-07-09 MED ORDER — PEMIGATINIB 4.5 MG PO TABS
ORAL_TABLET | ORAL | 5 refills | Status: DC
  Filled 2022-07-09: qty 28, fill #0

## 2022-07-10 ENCOUNTER — Ambulatory Visit: Payer: Medicare Other | Admitting: Oncology

## 2022-07-10 ENCOUNTER — Inpatient Hospital Stay: Payer: Medicare Other | Admitting: Dietician

## 2022-07-10 ENCOUNTER — Inpatient Hospital Stay: Payer: Medicare Other

## 2022-07-10 ENCOUNTER — Other Ambulatory Visit: Payer: Self-pay | Admitting: Oncology

## 2022-07-10 ENCOUNTER — Other Ambulatory Visit: Payer: Medicare Other

## 2022-07-10 DIAGNOSIS — T451X5A Adverse effect of antineoplastic and immunosuppressive drugs, initial encounter: Secondary | ICD-10-CM

## 2022-07-10 DIAGNOSIS — C221 Intrahepatic bile duct carcinoma: Secondary | ICD-10-CM | POA: Diagnosis not present

## 2022-07-10 DIAGNOSIS — R6889 Other general symptoms and signs: Secondary | ICD-10-CM

## 2022-07-10 DIAGNOSIS — C240 Malignant neoplasm of extrahepatic bile duct: Secondary | ICD-10-CM

## 2022-07-10 LAB — BASIC METABOLIC PANEL
BUN: 25 — AB (ref 4–21)
CO2: 30 — AB (ref 13–22)
Chloride: 101 (ref 99–108)
Creatinine: 2 — AB (ref 0.6–1.3)
Glucose: 96
Potassium: 4.7 mEq/L (ref 3.5–5.1)
Sodium: 138 (ref 137–147)

## 2022-07-10 LAB — HEPATIC FUNCTION PANEL
ALT: 29 U/L (ref 10–40)
AST: 38 (ref 14–40)
Alkaline Phosphatase: 80 (ref 25–125)
Bilirubin, Total: 0.8

## 2022-07-10 LAB — CBC AND DIFFERENTIAL
HCT: 32 — AB (ref 41–53)
Hemoglobin: 10.4 — AB (ref 13.5–17.5)
Neutrophils Absolute: 5.85
Platelets: 140 10*3/uL — AB (ref 150–400)
WBC: 7.8

## 2022-07-10 LAB — CBC: RBC: 3.53 — AB (ref 3.87–5.11)

## 2022-07-10 LAB — COMPREHENSIVE METABOLIC PANEL
Albumin: 3.9 (ref 3.5–5.0)
Calcium: 9.9 (ref 8.7–10.7)

## 2022-07-10 MED ORDER — SODIUM CHLORIDE 0.9% FLUSH
10.0000 mL | Freq: Once | INTRAVENOUS | Status: AC | PRN
  Administered 2022-07-10: 10 mL

## 2022-07-10 MED ORDER — SODIUM CHLORIDE 0.9% FLUSH: 3.0000 mL | Freq: Once | INTRAVENOUS | Status: DC | PRN

## 2022-07-10 MED ORDER — SODIUM CHLORIDE 0.9 % IV SOLN: INTRAVENOUS | Status: DC

## 2022-07-10 MED ORDER — SODIUM CHLORIDE 0.9 % IV SOLN: Freq: Once | INTRAVENOUS | Status: AC

## 2022-07-10 NOTE — Telephone Encounter (Signed)
Oral Oncology Pharmacist Encounter  Prescription refill for [facility] sent to [facility] in error. Patient enrolled in manufacturer assistance and receives medication through [employer]. Prescription redirected to [facility] specialty pharmacy.  Message sent to Dr. Bobby Rumpf to discuss current dosage of 13.'5mg'$  daily. Patients renal function has declined since starting medication and currently has a creatinine of 3.0 resulting in a CrCl of less than 76m/min. Due to this, patient has been dose reduced to 2 tablets ('9mg'$ ) daily for 14 days on, 7 days off and to repeat every 21 days.  Patient has been notified that he will now only take 2 tablets daily instead of the 3 tablets he was previously taking.   KDrema Halon PharmD Hematology/Oncology Clinical Pharmacist WH[facility] [phone]/17/2023 10:04 AM

## 2022-07-10 NOTE — Progress Notes (Signed)
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 The patient is currently on pemigatinib, retacrit, and getting Epoetin monthly.  He has PMHx that includes CHF, COPD, HTN, DM2, GERD, and Dysphagia.  He reports he has no appetite and foods just don't taste right.  He also has chronic constipation related to his pain meds.  Reports last bowel movement 10 days ago.  Took stool softener yesterday, take MOM but hasn't helped.  He was dizzy today standing, reports he hadn't eaten any breakfast today or had anything to drink.  Seated BP 130/59 with pulse 99.  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  We met in my office and I was able to raise my voice so he could hear.  He reports he doesn't like ONS that are creamy. Provided samples of Ensure Clear, and Pedialyte (strawberry lemonade) he liked the Ensure but not the Pedialyte.  He lives alone, and has no one to check in on him during the day or help with meal prep.  He says he feels safe enough to cook at stove if he hold onto something. He reports he doesn't have much food to snack on at home, and that his sister Sandi Mariscal helps him with the grocery shopping.  He gave permission for me to speak with sister to discuss things to uy him to help with increasing his intake.   Breakfast usually: Eggs and [place] ham...Marland KitchenMarland KitchenCoffee  S& L (just occasionally) No lunch....  fast food couple times a week.   Doesn't drive more than he has to.  [employer]( doesn't like the biscuits or eat bread) gets fries.   Friends bring him food.   Oatmeal cookies, Lance peanut butter crackers, banana sandwich. Fluids:  Sometime milk, water taste funny,    Nutrition Focused Physical Exam:  Didn't perform today, he was not feeling up to having done  today  Medications: IVF,  Vit D, lasix, Zofran   Labs: 07/10/22  BUN 25, Creat 2.0, Hgb 10.4  Anthropometrics: weight loss 21.7# (9%) past 6 months  Height: 274" Weight:   07/10/22  219.8# 07/04/22  213.8# 06/19/22  218.1# 04/06/22  230.7# 01/08/22  241.5#  UBW: reports UBW 316 a year ago, per EMR 254# 06/29/22 BMI: 27.46   Estimated Energy Needs  Kcals: 2500-3000 Protein: 100-120 g Fluid: 3 L   NUTRITION DIAGNOSIS: Inadequate intake r/t cancer and side effects AEB recall and weight loss continued weight loss past several years.   MALNUTRITION DIAGNOSIS: weight loss approaching significance, and PO intake reduced   INTERVENTION: Encouraged increased high calorie fluids and adding plain milk twice daily  Suggested 2 -3 Ensure clear per day gave coupons Suggested hot liquids for constipation (provided bag from food pantry with soups, snacks)  Provided tips for high calorie snacking and constipation along with contact information.   MONITORING, EVALUATION, GOAL: PO intake, weight, labs, goal is weight maintenance   Next Visit: Will call son to get sister's phone number she shops for patient.  April Manson, RDN, LDN Registered Dietitian, [place] Part Time Remote (Usual office hours: Tuesday-Thursday) Mobile: [phone] Remote Office: [phone]

## 2022-07-11 ENCOUNTER — Telehealth: Payer: Self-pay | Admitting: Dietician

## 2022-07-11 ENCOUNTER — Telehealth: Payer: Self-pay

## 2022-07-11 NOTE — Telephone Encounter (Signed)
Melinda (Jason's friend/pt.'s nanny) Returned call.  She reports that she does go over every night to make sure patient has at least one meal.  She said she works 2 jobs and can't get over during the day much but will on occasion go over at lunch.  She reports there is plenty of food in the house and that she leaves shelf stable snacks and leftovers in fridge but he doesn't eat them.  She says he sleeps lots during the day and won't pick up the phone for reminder calls to eat. She also said that patient's sister does work and sometimes attempts to visit but patient won't let her in.  Patient didn't remember meeting with me yesterday, he only remembered getting IVF.  Encouraged continued use of yogurts (he's eating these most days).  Told her about coupons for Ensure clear, if she could get them when she shops next he enjoyed Mixed fruit flavor.  April Manson, RDN, LDN Registered Dietitian, [place] Part Time Remote (Usual office hours: Tuesday-Thursday) Mobile: [phone] Remote Office: [phone]

## 2022-07-11 NOTE — Telephone Encounter (Signed)
Received call from [employer]. She states that pt is unable to receive referral visit today per pt's son. She needs an order to push the referral visit back until tomorrow, 07/12/2022. Dr Bobby Rumpf is in agreement.

## 2022-07-11 NOTE — Telephone Encounter (Signed)
Called patient's son as requested by patient to get the telephone number for [place] who patient reported does the grocery shopping.  Son relayed that his aunt doesn't do the shopping, but rather to reach out to [place] who is his best friend and "like a nanny."   Son states she lives down the street from patient and checks in on him and gets him food and helps with some meal prep.  Melinda's # [id number].  He said he'd reach out to let her know I would be calling.  I told him I would try her this afternoon. Son states he lives 25 minutes from patient.  April Manson, RDN, LDN Registered Dietitian, [place] Part Time Remote (Usual office hours: Tuesday-Thursday) Mobile: [phone] Remote Office: [phone]

## 2022-07-12 ENCOUNTER — Ambulatory Visit: Payer: Medicare Other

## 2022-07-13 ENCOUNTER — Telehealth: Payer: Self-pay

## 2022-07-13 NOTE — Telephone Encounter (Signed)
Unk Lightning, w/Enhabit Loyal LVM on nurse line req return call regarding orders and medication management. Pt has multiple medications that have similar side effects that she feels is causing or contributing to some of Mr Amores's physical issues (such as weakness, sedation, hypotension, syncope, falls,.Marland Kitchen). She wants to know if Dr Bobby Rumpf is ok with her reaching out to pt's PCP for assistance. She also needs verbal orders for nursing visits @ least once weekly, and for PT evaluation.  Dr Bobby Rumpf in agreement with verbal orders and contacting PCP to handle medication reconciliation.  Carol,RN, notified.

## 2022-07-16 ENCOUNTER — Telehealth: Payer: Self-pay

## 2022-07-16 DIAGNOSIS — C221 Intrahepatic bile duct carcinoma: Secondary | ICD-10-CM

## 2022-07-16 MED ORDER — PEMIGATINIB 4.5 MG PO TABS: ORAL_TABLET | ORAL | 5 refills | Status: DC

## 2022-07-17 ENCOUNTER — Telehealth: Payer: Self-pay

## 2022-07-17 NOTE — Telephone Encounter (Signed)
10/24 @ 1410 pt returned my call. I told him that the PT had told me he was in excruciating pain in his back. I called him to fins out what he had taken for pain and if he was getting any relief. Pt replied, "Oh I'm ok. I think some of my problem in my back was that I was constipated. I just had a bowel movement though. I encouraged pt to please take 2 senna-s po BID, and that he can increase to 3 tab BID if needed. He had not taken any hydrocodone today either. I encouraged him to take something for pain. Pt voiced appreciation for my concern and call.   I have attempted call to pt to see what he has taken for pain. I received call from the home health, PT, [place]. She reports that pt is in excruciating pain in his lower back. No answer.

## 2022-07-17 NOTE — Telephone Encounter (Signed)
JoJo, PT, for [facility] requested PT visits as follows: once weekly for 1 week, then twice weekly for 2 weeks, then once weekly for 2 weeks. She also requested an OT eval for pt. Dr Bobby Rumpf in agreement with all above orders.

## 2022-07-18 NOTE — Telephone Encounter (Signed)
Oral Oncology Pharmacist Encounter  Received notification that [facility] now has to be sent to [facility]. Prescription redirected on 07/16/22 in order for patient to receive by Wednesday 10/25 to start next cycle.   Mckesson verified patient was dose reduced to '9mg'$  at that time.   Drema Halon, PharmD Hematology/Oncology Clinical Pharmacist [facility] [phone]

## 2022-07-24 NOTE — Progress Notes (Signed)
[facility]  [address] [place],  [place]  [address] [phone]  Clinic Day:  07/25/2022  Referring physician: Bonnita Nasuti, MD   HISTORY OF PRESENT ILLNESS:  The patient is a 81 y.o. 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  The patient is currently on pemigatinib for his disease management.  He also receives Retacrit every few weeks to keep his hemoglobin at or above 10.  He comes in today to be evaluated as he is in the middle of his 9th cycle of pemigatinib.  Since his last visit, the patient has done much better.  He is now being followed by home health, who has worked with his other physicians in eliminating unnecessary medications, including some of his antihypertensive agents.  The patient has definitely been doing much better since these agents have been stopped.  With respect to his disease, he denies having any new symptoms or findings which concern him for possible disease progression.    PHYSICAL EXAM:  Blood pressure 121/65, pulse 72, temperature 98.6 F (37 C), resp. rate 16, height _0  (1.88 m), weight 218 lb 9.6 oz (99.2 kg), SpO2 92 %. Wt Readings from Last 3 Encounters:  07/25/22 218 lb 9.6 oz (99.2 kg)  07/04/22 213 lb 14.4 oz (97 kg)  06/29/22 212 lb 12.8 oz (96.5 kg)   Body mass index is 28.07 kg/m.  Performance status (ECOG): 1 - Symptomatic but completely ambulatory  Physical Exam Vitals and nursing note reviewed.  Constitutional:      General: He is not in acute distress.    Appearance: Normal appearance. He is normal weight.     Comments: He looks much better today and is walking with a cane, which he is barely using.  HENT:     Head: Normocephalic and atraumatic.     Mouth/Throat:     Mouth: Mucous membranes are moist.     Pharynx: Oropharynx is clear. No oropharyngeal exudate or posterior  oropharyngeal erythema.  Eyes:     General: No scleral icterus.    Extraocular Movements: Extraocular movements intact.     Conjunctiva/sclera: Conjunctivae normal.     Pupils: Pupils are equal, round, and reactive to light.  Cardiovascular:     Rate and Rhythm: Normal rate and regular rhythm.     Heart sounds: Normal heart sounds. No murmur heard.    No friction rub. No gallop.  Pulmonary:     Effort: Pulmonary effort is normal.     Breath sounds: Normal breath sounds. No wheezing, rhonchi or rales.  Abdominal:     General: Bowel sounds are normal. There is no distension.     Palpations: Abdomen is soft. There is no hepatomegaly, splenomegaly or mass.     Tenderness: There is no abdominal tenderness.  Musculoskeletal:        General: Normal range of motion.     Cervical back: Normal range of motion and neck supple. No tenderness.     Right lower leg: No edema.     Left lower leg: No edema.  Lymphadenopathy:     Cervical: No cervical adenopathy.     Upper Body:     Right upper body: No supraclavicular or axillary adenopathy.     Left upper body: No supraclavicular or axillary adenopathy.     Lower Body: No right inguinal adenopathy. No left inguinal adenopathy.  Skin:  General: Skin is warm and dry.     Coloration: Skin is not jaundiced.     Findings: No rash.  Neurological:     Mental Status: He is alert and oriented to person, place, and time.     Cranial Nerves: No cranial nerve deficit.  Psychiatric:        Mood and Affect: Mood normal.        Behavior: Behavior normal.        Thought Content: Thought content normal.    LABS:  Latest Reference Range & Units 07/25/22 00:00  Sodium 137 - 147  142 (E)  Potassium 3.5 - 5.1 mEq/L 4.1 (E)  Chloride 99 - 108  108 (E)  CO2 13 - 22  29 ! (E)  Glucose  140 (E)  BUN 4 - 21  23 ! (E)  Creatinine 0.6 - 1.3  1.5 ! (E)  Calcium 8.7 - 10.7  8.9 (E)  Alkaline Phosphatase 25 - 125  69 (E)  Albumin 3.5 - 5.0  3.5 (E)  AST 14 -  40  36 (E)  ALT 10 - 40 U/L 25 (E)  Bilirubin, Total  0.6 (E)  WBC  9.0 (E)  RBC 3.87 - 5.11  3.26 ! (E)  Hemoglobin 13.5 - 17.5  9.8 ! (E)  HCT 41 - 53  29 ! (E)  Platelets 150 - 400 K/uL 122 ! (E)  !: Data is abnormal (E): External lab result  ASSESSMENT & PLAN:  Assessment/Plan:   A 81 y.o. male with known metastatic FGFR2 mutation positive cholangiocarcinoma.  He is currently in the middle of his 9th cycle of pemigatinib. Clinically, the patient looks much better today than what he has over the past few months.  He knows to finish his current and next cycle of pemigatinib therapy before I see him back next time.  He will be seen back in 5 weeks, with an abdominal MRI being done before his next visit to ascertain his new disease baseline after 10 cycles of pemigatinib.  The patient understands all the plans discussed today and is in agreement with them.  Ransome Helwig Macarthur Critchley, MD

## 2022-07-25 ENCOUNTER — Inpatient Hospital Stay: Payer: Medicare Other

## 2022-07-25 ENCOUNTER — Other Ambulatory Visit: Payer: Self-pay | Admitting: Oncology

## 2022-07-25 ENCOUNTER — Ambulatory Visit: Payer: Medicare Other

## 2022-07-25 ENCOUNTER — Inpatient Hospital Stay: Payer: Medicare Other | Attending: Oncology | Admitting: Oncology

## 2022-07-25 VITALS — BP 121/65 | HR 72 | Temp 98.6°F | Resp 16 | Ht 74.0 in | Wt 218.6 lb

## 2022-07-25 DIAGNOSIS — D6481 Anemia due to antineoplastic chemotherapy: Secondary | ICD-10-CM | POA: Insufficient documentation

## 2022-07-25 DIAGNOSIS — C221 Intrahepatic bile duct carcinoma: Secondary | ICD-10-CM

## 2022-07-25 DIAGNOSIS — C240 Malignant neoplasm of extrahepatic bile duct: Secondary | ICD-10-CM | POA: Diagnosis not present

## 2022-07-25 DIAGNOSIS — Z79899 Other long term (current) drug therapy: Secondary | ICD-10-CM | POA: Insufficient documentation

## 2022-07-25 LAB — BASIC METABOLIC PANEL
BUN: 23 — AB (ref 4–21)
CO2: 29 — AB (ref 13–22)
Chloride: 108 (ref 99–108)
Creatinine: 1.5 — AB (ref 0.6–1.3)
Glucose: 140
Potassium: 4.1 mEq/L (ref 3.5–5.1)
Sodium: 142 (ref 137–147)

## 2022-07-25 LAB — CBC AND DIFFERENTIAL
HCT: 29 — AB (ref 41–53)
Hemoglobin: 9.8 — AB (ref 13.5–17.5)
Neutrophils Absolute: 6.48
Platelets: 122 10*3/uL — AB (ref 150–400)
WBC: 9

## 2022-07-25 LAB — HEPATIC FUNCTION PANEL
ALT: 25 U/L (ref 10–40)
AST: 36 (ref 14–40)
Alkaline Phosphatase: 69 (ref 25–125)
Bilirubin, Total: 0.6

## 2022-07-25 LAB — COMPREHENSIVE METABOLIC PANEL
Albumin: 3.5 (ref 3.5–5.0)
Calcium: 8.9 (ref 8.7–10.7)

## 2022-07-25 LAB — CBC: RBC: 3.26 — AB (ref 3.87–5.11)

## 2022-07-26 ENCOUNTER — Inpatient Hospital Stay: Payer: Medicare Other

## 2022-07-26 VITALS — BP 126/60 | HR 73 | Temp 97.8°F | Resp 18 | Ht 74.0 in | Wt 219.0 lb

## 2022-07-26 DIAGNOSIS — C240 Malignant neoplasm of extrahepatic bile duct: Secondary | ICD-10-CM

## 2022-07-26 DIAGNOSIS — D6481 Anemia due to antineoplastic chemotherapy: Secondary | ICD-10-CM | POA: Diagnosis not present

## 2022-07-26 DIAGNOSIS — C221 Intrahepatic bile duct carcinoma: Secondary | ICD-10-CM | POA: Diagnosis not present

## 2022-07-26 DIAGNOSIS — Z79899 Other long term (current) drug therapy: Secondary | ICD-10-CM | POA: Diagnosis not present

## 2022-07-26 MED ORDER — EPOETIN ALFA-EPBX 20000 UNIT/ML IJ SOLN
20000.0000 [IU] | Freq: Once | INTRAMUSCULAR | Status: AC
  Administered 2022-07-26: 20000 [IU] via SUBCUTANEOUS

## 2022-07-26 NOTE — Patient Instructions (Signed)

## 2022-07-31 ENCOUNTER — Encounter: Payer: Self-pay | Admitting: Oncology

## 2022-08-01 ENCOUNTER — Encounter: Payer: Self-pay | Admitting: Oncology

## 2022-08-08 ENCOUNTER — Other Ambulatory Visit: Payer: Self-pay

## 2022-08-08 ENCOUNTER — Other Ambulatory Visit: Payer: Medicare Other

## 2022-08-08 ENCOUNTER — Ambulatory Visit: Payer: Medicare Other | Admitting: Dietician

## 2022-08-08 DIAGNOSIS — C221 Intrahepatic bile duct carcinoma: Secondary | ICD-10-CM

## 2022-08-08 NOTE — Progress Notes (Signed)
Nutrition Follow-up:  Called patient at home telephone number to check on PO intake and appetite.  He states he's feeling much better and foods seem to be tasting a little better so he feels he's eating more.  He said he didn't have any of the Ensure clear in the home but he has Boost and regular Ensure.  He also said he still doesn't like them.  I am not convinced that he remembers trying the Ensure clear that I gave him in my office.  He  did say if I should text [employer] [place] to buy it with his groceries because he doesn't remember much.  He admits he does have snack available to him in his home, I encouraged him to use them between his meals to help maintain his weight.  Usual intake: Breakfast: 2 eggs, coffee Lunch: Chicken strips or yogurts Dinner:  said son or friend will bring food each night  Water, tea  Medications: reviewed, per MD note some adjustments in meds have been made to eliminate unnecessary meds, and home health is following.   Labs: reviewed 07/13/22  Anthropometrics: weight stable since nutrition consult    Height: 74" Weight:  07/26/22  219# 07/10/22  219.8# 07/04/22  213.8# 06/19/22  218.1# 04/06/22  230.7# 01/08/22  241.5#  BMI: 28.12     Estimated Energy Needs   Kcals: 2500-3000 Protein: 100-120 g Fluid: 3 L     NUTRITION DIAGNOSIS: Inadequate intake r/t cancer and side effects AEB recall and continued weight loss past several years. Resolving as he has maintain weight since past visit.     MALNUTRITION DIAGNOSIS: weight loss approaching significance, and PO intake reduced     INTERVENTION: Encouraged increased high calorie fluids and   Suggested 2 Ensure clear per day  Will text Melinda and main coupons Encouraged between meal snacking Encouraged continued monitoring of his weight with goal to maintain  MONITORING, EVALUATION, GOAL: PO intake, weight, labs, goal is weight maintenance    Next Visit:  PRN at patient or provider request   April Manson, RDN, LDN Registered Dietitian, [place] (Usual office hours: Tuesday-Thursday) Mobile: [phone] Remote Office: [phone]

## 2022-08-09 ENCOUNTER — Encounter: Payer: Self-pay | Admitting: Oncology

## 2022-08-09 ENCOUNTER — Inpatient Hospital Stay: Payer: Medicare Other

## 2022-08-09 ENCOUNTER — Ambulatory Visit: Payer: Medicare Other

## 2022-08-09 DIAGNOSIS — D6481 Anemia due to antineoplastic chemotherapy: Secondary | ICD-10-CM | POA: Diagnosis not present

## 2022-08-09 DIAGNOSIS — R6889 Other general symptoms and signs: Secondary | ICD-10-CM

## 2022-08-09 LAB — COMPREHENSIVE METABOLIC PANEL
ALT: 17 U/L (ref 0–44)
AST: 22 U/L (ref 15–41)
Albumin: 3.4 g/dL — ABNORMAL LOW (ref 3.5–5.0)
Alkaline Phosphatase: 65 U/L (ref 38–126)
Anion gap: 8 (ref 5–15)
BUN: 23 mg/dL (ref 8–23)
CO2: 31 mmol/L (ref 22–32)
Calcium: 9.6 mg/dL (ref 8.9–10.3)
Chloride: 105 mmol/L (ref 98–111)
Creatinine, Ser: 1.43 mg/dL — ABNORMAL HIGH (ref 0.61–1.24)
GFR, Estimated: 50 mL/min — ABNORMAL LOW (ref >60)
Glucose, Bld: 109 mg/dL — ABNORMAL HIGH (ref 70–99)
Potassium: 3.9 mmol/L (ref 3.5–5.1)
Sodium: 144 mmol/L (ref 135–145)
Total Bilirubin: 0.8 mg/dL (ref 0.3–1.2)
Total Protein: 7 g/dL (ref 6.5–8.1)

## 2022-08-09 LAB — CBC WITH DIFFERENTIAL (CANCER CENTER ONLY)
Abs Immature Granulocytes: 0.03 10*3/uL (ref 0.00–0.07)
Basophils Absolute: 0.1 10*3/uL (ref 0.0–0.1)
Basophils Relative: 1 %
Eosinophils Absolute: 0.3 10*3/uL (ref 0.0–0.5)
Eosinophils Relative: 5 %
HCT: 33.1 % — ABNORMAL LOW (ref 39.0–52.0)
Hemoglobin: 10.1 g/dL — ABNORMAL LOW (ref 13.0–17.0)
Immature Granulocytes: 1 %
Lymphocytes Relative: 29 %
Lymphs Abs: 1.8 10*3/uL (ref 0.7–4.0)
MCH: 29.6 pg (ref 26.0–34.0)
MCHC: 30.5 g/dL (ref 30.0–36.0)
MCV: 97.1 fL (ref 80.0–100.0)
Monocytes Absolute: 0.7 10*3/uL (ref 0.1–1.0)
Monocytes Relative: 10 %
Neutro Abs: 3.5 10*3/uL (ref 1.7–7.7)
Neutrophils Relative %: 54 %
Platelet Count: 125 10*3/uL — ABNORMAL LOW (ref 150–400)
RBC: 3.41 MIL/uL — ABNORMAL LOW (ref 4.22–5.81)
RDW: 14.8 % (ref 11.5–15.5)
WBC Count: 6.4 10*3/uL (ref 4.0–10.5)
nRBC: 0 % (ref 0.0–0.2)

## 2022-08-09 MED ORDER — EPOETIN ALFA-EPBX 20000 UNIT/ML IJ SOLN: 20000.0000 [IU] | Freq: Once | INTRAMUSCULAR | Status: DC

## 2022-08-09 MED ORDER — PEMIGATINIB 9 MG PO TABS: 9.0000 mg | ORAL_TABLET | Freq: Every day | ORAL | 2 refills | Status: DC

## 2022-08-10 ENCOUNTER — Ambulatory Visit: Payer: Medicare Other

## 2022-08-10 ENCOUNTER — Inpatient Hospital Stay: Payer: Medicare Other

## 2022-08-13 NOTE — Progress Notes (Signed)
Faxed in referral to [facility] for [facility], patient needs to re-enroll for 2024.

## 2022-08-22 ENCOUNTER — Inpatient Hospital Stay: Payer: Medicare Other

## 2022-08-22 ENCOUNTER — Other Ambulatory Visit: Payer: Medicare Other

## 2022-08-22 ENCOUNTER — Encounter: Payer: Self-pay | Admitting: Oncology

## 2022-08-22 DIAGNOSIS — D6481 Anemia due to antineoplastic chemotherapy: Secondary | ICD-10-CM | POA: Diagnosis not present

## 2022-08-22 DIAGNOSIS — R6889 Other general symptoms and signs: Secondary | ICD-10-CM

## 2022-08-22 LAB — COMPREHENSIVE METABOLIC PANEL
ALT: 14 U/L (ref 0–44)
AST: 19 U/L (ref 15–41)
Albumin: 3.5 g/dL (ref 3.5–5.0)
Alkaline Phosphatase: 56 U/L (ref 38–126)
Anion gap: 6 (ref 5–15)
BUN: 21 mg/dL (ref 8–23)
CO2: 29 mmol/L (ref 22–32)
Calcium: 9.7 mg/dL (ref 8.9–10.3)
Chloride: 106 mmol/L (ref 98–111)
Creatinine, Ser: 1.79 mg/dL — ABNORMAL HIGH (ref 0.61–1.24)
GFR, Estimated: 38 mL/min — ABNORMAL LOW (ref >60)
Glucose, Bld: 99 mg/dL (ref 70–99)
Potassium: 4 mmol/L (ref 3.5–5.1)
Sodium: 141 mmol/L (ref 135–145)
Total Bilirubin: 0.9 mg/dL (ref 0.3–1.2)
Total Protein: 6.9 g/dL (ref 6.5–8.1)

## 2022-08-22 LAB — CBC WITH DIFFERENTIAL (CANCER CENTER ONLY)
Abs Immature Granulocytes: 0.03 10*3/uL (ref 0.00–0.07)
Basophils Absolute: 0.1 10*3/uL (ref 0.0–0.1)
Basophils Relative: 1 %
Eosinophils Absolute: 0.3 10*3/uL (ref 0.0–0.5)
Eosinophils Relative: 4 %
HCT: 34.6 % — ABNORMAL LOW (ref 39.0–52.0)
Hemoglobin: 10.7 g/dL — ABNORMAL LOW (ref 13.0–17.0)
Immature Granulocytes: 0 %
Lymphocytes Relative: 21 %
Lymphs Abs: 1.5 10*3/uL (ref 0.7–4.0)
MCH: 30 pg (ref 26.0–34.0)
MCHC: 30.9 g/dL (ref 30.0–36.0)
MCV: 96.9 fL (ref 80.0–100.0)
Monocytes Absolute: 0.6 10*3/uL (ref 0.1–1.0)
Monocytes Relative: 9 %
Neutro Abs: 4.6 10*3/uL (ref 1.7–7.7)
Neutrophils Relative %: 65 %
Platelet Count: 130 10*3/uL — ABNORMAL LOW (ref 150–400)
RBC: 3.57 MIL/uL — ABNORMAL LOW (ref 4.22–5.81)
RDW: 13.8 % (ref 11.5–15.5)
WBC Count: 7 10*3/uL (ref 4.0–10.5)
nRBC: 0 % (ref 0.0–0.2)

## 2022-08-23 ENCOUNTER — Inpatient Hospital Stay: Payer: Medicare Other

## 2022-08-23 NOTE — Progress Notes (Signed)
Hemoglobin 10.7 retacrit shot not needed

## 2022-08-28 ENCOUNTER — Telehealth: Payer: Self-pay

## 2022-08-28 ENCOUNTER — Other Ambulatory Visit: Payer: Self-pay | Admitting: Hematology and Oncology

## 2022-08-28 DIAGNOSIS — R110 Nausea: Secondary | ICD-10-CM

## 2022-08-28 NOTE — Telephone Encounter (Signed)
Dr Bobby Rumpf notified of the call received from Dr Len Blalock. Her question is how often to continue doing the macular eye exams on pt?  Dr Bobby Rumpf states, "every 3 months should be fine".  I called the eye care center of [place] and notified them to schedule pt every 3 months now. I spoke with Izora Gala, she will convey message to Dr Wynetta Emery.

## 2022-08-28 NOTE — Progress Notes (Signed)
[name]  [address] [place],  [place]  [address] [phone]  Clinic Day:  08/29/2022  Referring physician: Bonnita Nasuti, MD   HISTORY OF PRESENT ILLNESS:  The patient is a 81 y.o. 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  The patient is currently on pemigatinib for his disease management.  He also receives Retacrit every few weeks to keep his hemoglobin at or above 10.  He comes in today to go his CT scans to ascertain his new disease baseline while on pemagatinib.  Since his last visit, the patient has been doing much better.  He denies having any significant side effects with his pemigatinib.   With respect to his disease, he denies having any new symptoms or findings which concern him for possible disease progression.    PHYSICAL EXAM:  Blood pressure 124/68, pulse 68, temperature 98.2 F (36.8 C), temperature source Oral, resp. rate 18, height _0  (1.88 m), weight 214 lb 4.8 oz (97.2 kg), SpO2 95 %. Wt Readings from Last 3 Encounters:  08/29/22 214 lb 4.8 oz (97.2 kg)  08/23/22 215 lb 1.9 oz (97.6 kg)  07/26/22 219 lb (99.3 kg)   Body mass index is 27.51 kg/m.  Performance status (ECOG): 1 - Symptomatic but completely ambulatory  Physical Exam Vitals and nursing note reviewed.  Constitutional:      General: He is not in acute distress.    Appearance: Normal appearance. He is normal weight.     Comments: He looks much better today and is walking with a cane, which he is barely using.  HENT:     Head: Normocephalic and atraumatic.     Mouth/Throat:     Mouth: Mucous membranes are moist.     Pharynx: Oropharynx is clear. No oropharyngeal exudate or posterior oropharyngeal erythema.  Eyes:     General: No scleral icterus.    Extraocular Movements: Extraocular movements intact.     Conjunctiva/sclera:  Conjunctivae normal.     Pupils: Pupils are equal, round, and reactive to light.  Cardiovascular:     Rate and Rhythm: Normal rate and regular rhythm.     Heart sounds: Normal heart sounds. No murmur heard.    No friction rub. No gallop.  Pulmonary:     Effort: Pulmonary effort is normal.     Breath sounds: Normal breath sounds. No wheezing, rhonchi or rales.  Abdominal:     General: Bowel sounds are normal. There is no distension.     Palpations: Abdomen is soft. There is no hepatomegaly, splenomegaly or mass.     Tenderness: There is no abdominal tenderness.  Musculoskeletal:        General: Normal range of motion.     Cervical back: Normal range of motion and neck supple. No tenderness.     Right lower leg: No edema.     Left lower leg: No edema.  Lymphadenopathy:     Cervical: No cervical adenopathy.     Upper Body:     Right upper body: No supraclavicular or axillary adenopathy.     Left upper body: No supraclavicular or axillary adenopathy.     Lower Body: No right inguinal adenopathy. No left inguinal adenopathy.  Skin:    General: Skin is warm and dry.     Coloration: Skin is not jaundiced.     Findings: No rash.  Neurological:  Mental Status: He is alert and oriented to person, place, and time.     Cranial Nerves: No cranial nerve deficit.  Psychiatric:        Mood and Affect: Mood normal.        Behavior: Behavior normal.        Thought Content: Thought content normal.   SCANS: His abdominal MRI done approximately 2 weeks ago revealed the following: FINDINGS: Lower chest: No acute findings.  Hepatobiliary: Prior left hemicolectomy. Previously described clustered lesions within segment 4A are not as well visualized on today's exam due to motion artifact. Lesion within the posterior dome of the right hepatic lobe measures 5 mm on series 9, image 13, unchanged when compared with the prior exam. No new liver lesions.  Pancreas: No mass, inflammatory changes, or  other parenchymal abnormality identified.  Spleen: Within normal limits in size and appearance.  Adrenals/Urinary Tract: No masses identified. No evidence of hydronephrosis.  Stomach/Bowel: Enhancing mass adjacent to the distal descending colon measures 6.2 x 3.1 cm on series 23, image 51, unchanged when compared to prior exam. Partially visualized mass within the lower abdomen is unchanged in size, measuring approximately 6.3 cm on series 3, image 25  Vascular/Lymphatic: No pathologically enlarged lymph nodes identified. No abdominal aortic aneurysm demonstrated.  Other: None.  Musculoskeletal: No suspicious bone lesions identified.  IMPRESSION: 1. Previously described clustered lesions along the resection margin within segment 4A are not as well visualized on today's exam, likely due to motion artifact. Lesion within the posterior dome of the right hepatic lobe is stable. No new liver lesions identified. 2. Stable peritoneal lesions.  LABS:   ASSESSMENT & PLAN:  Assessment/Plan:   A 81 y.o. male with known metastatic FGFR2 mutation positive cholangiocarcinoma.  In clinic today, I went over all of his abdominal MRI images with him, for which he could see there are no new or worrisome findings that suggest his disease is progressing while on his oral pemigatinib therapy.  Based upon this, he will continue taking pemigatinib daily, every 2 out of 3 weeks.  I am particularly pleased with how well this gentleman has physically improved over the past few months.  I will see him back in mid January 2024 for repeat clinical assessment..  The patient understands all the plans discussed today and is in agreement with them.  Aster Eckrich Macarthur Critchley, MD

## 2022-08-29 ENCOUNTER — Inpatient Hospital Stay: Payer: Medicare Other | Attending: Oncology | Admitting: Oncology

## 2022-08-29 ENCOUNTER — Inpatient Hospital Stay: Payer: Medicare Other

## 2022-08-29 ENCOUNTER — Other Ambulatory Visit: Payer: Self-pay | Admitting: Oncology

## 2022-08-29 VITALS — BP 124/68 | HR 68 | Temp 98.2°F | Resp 18 | Ht 74.0 in | Wt 214.3 lb

## 2022-08-29 DIAGNOSIS — C786 Secondary malignant neoplasm of retroperitoneum and peritoneum: Secondary | ICD-10-CM | POA: Insufficient documentation

## 2022-08-29 DIAGNOSIS — C240 Malignant neoplasm of extrahepatic bile duct: Secondary | ICD-10-CM | POA: Diagnosis not present

## 2022-08-29 DIAGNOSIS — C221 Intrahepatic bile duct carcinoma: Secondary | ICD-10-CM | POA: Diagnosis not present

## 2022-08-29 LAB — CMP (CANCER CENTER ONLY)
ALT: 13 U/L (ref 0–44)
AST: 21 U/L (ref 15–41)
Albumin: 3.4 g/dL — ABNORMAL LOW (ref 3.5–5.0)
Alkaline Phosphatase: 55 U/L (ref 38–126)
Anion gap: 4 — ABNORMAL LOW (ref 5–15)
BUN: 32 mg/dL — ABNORMAL HIGH (ref 8–23)
CO2: 32 mmol/L (ref 22–32)
Calcium: 9.6 mg/dL (ref 8.9–10.3)
Chloride: 105 mmol/L (ref 98–111)
Creatinine: 1.84 mg/dL — ABNORMAL HIGH (ref 0.61–1.24)
GFR, Estimated: 37 mL/min — ABNORMAL LOW (ref >60)
Glucose, Bld: 101 mg/dL — ABNORMAL HIGH (ref 70–99)
Potassium: 3.9 mmol/L (ref 3.5–5.1)
Sodium: 141 mmol/L (ref 135–145)
Total Bilirubin: 0.7 mg/dL (ref 0.3–1.2)
Total Protein: 6.9 g/dL (ref 6.5–8.1)

## 2022-08-30 ENCOUNTER — Other Ambulatory Visit: Payer: Self-pay | Admitting: Hematology and Oncology

## 2022-08-30 LAB — CBC AND DIFFERENTIAL
HCT: 33 — AB (ref 41–53)
Hemoglobin: 11 — AB (ref 13.5–17.5)
MCV: 91 (ref 80–94)
Neutrophils Absolute: 4.18
Platelets: 115 10*3/uL — AB (ref 150–400)
WBC: 7.2

## 2022-08-30 LAB — CBC: RBC: 3.61 — AB (ref 3.87–5.11)

## 2022-09-24 ENCOUNTER — Encounter: Payer: Self-pay | Admitting: Oncology

## 2022-09-28 ENCOUNTER — Other Ambulatory Visit: Payer: Self-pay

## 2022-10-01 ENCOUNTER — Ambulatory Visit: Payer: Medicare Other | Admitting: Cardiology

## 2022-10-02 ENCOUNTER — Encounter: Payer: Self-pay | Admitting: Oncology

## 2022-10-03 NOTE — Progress Notes (Signed)
[facility]  [address] [place],  [place]  [address] [phone]  Clinic Day:  10/04/2022  Referring physician: Galvin Proffer, MD   HISTORY OF PRESENT ILLNESS:  The patient is a 82 y.o. 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  The patient is currently on pemigatinib for his disease management.  He also receives Retacrit every few weeks to keep his hemoglobin at or above 10.  He comes in today for routine follow-up.  Since his last visit, the patient has been doing fine.  He denies having any significant side effects with his pemigatinib.   With respect to his disease, he denies having any new symptoms or findings which concern him for possible disease progression.    PHYSICAL EXAM:  Blood pressure (!) 92/50, pulse 74, temperature 98.2 F (36.8 C), resp. rate 16, height 6\' 2"  (1.88 m), weight 205 lb 8 oz (93.2 kg), SpO2 92 %. Wt Readings from Last 3 Encounters:  10/04/22 205 lb 8 oz (93.2 kg)  08/29/22 214 lb 4.8 oz (97.2 kg)  08/23/22 215 lb 1.9 oz (97.6 kg)   Body mass index is 26.38 kg/m.  Performance status (ECOG): 1 - Symptomatic but completely ambulatory  Physical Exam Vitals and nursing note reviewed.  Constitutional:      General: He is not in acute distress.    Appearance: Normal appearance. He is normal weight.     Comments: He looks much better today and is walking with a cane, which he is barely using.  HENT:     Head: Normocephalic and atraumatic.     Mouth/Throat:     Mouth: Mucous membranes are moist.     Pharynx: Oropharynx is clear. No oropharyngeal exudate or posterior oropharyngeal erythema.  Eyes:     General: No scleral icterus.    Extraocular Movements: Extraocular movements intact.     Conjunctiva/sclera: Conjunctivae normal.     Pupils: Pupils are equal, round, and reactive to light.   Cardiovascular:     Rate and Rhythm: Normal rate and regular rhythm.     Heart sounds: Normal heart sounds. No murmur heard.    No friction rub. No gallop.  Pulmonary:     Effort: Pulmonary effort is normal.     Breath sounds: Normal breath sounds. No wheezing, rhonchi or rales.  Abdominal:     General: Bowel sounds are normal. There is no distension.     Palpations: Abdomen is soft. There is no hepatomegaly, splenomegaly or mass.     Tenderness: There is no abdominal tenderness.  Musculoskeletal:        General: Normal range of motion.     Cervical back: Normal range of motion and neck supple. No tenderness.     Right lower leg: No edema.     Left lower leg: No edema.  Lymphadenopathy:     Cervical: No cervical adenopathy.     Upper Body:     Right upper body: No supraclavicular or axillary adenopathy.     Left upper body: No supraclavicular or axillary adenopathy.     Lower Body: No right inguinal adenopathy. No left inguinal adenopathy.  Skin:    General: Skin is warm and dry.     Coloration: Skin is not jaundiced.     Findings: No rash.  Neurological:     Mental Status: He is alert and oriented to person, place, and  time.     Cranial Nerves: No cranial nerve deficit.  Psychiatric:        Mood and Affect: Mood normal.        Behavior: Behavior normal.        Thought Content: Thought content normal.    LABS:  Latest Reference Range & Units 10/04/22 00:00  WBC  7.4 (E)  RBC 3.87 - 5.11  3.81 ! (E)  Hemoglobin 13.5 - 17.5  11.4 ! (E)  HCT 41 - 53  35 ! (E)  Platelets 150 - 400 K/uL 127 ! (E)  NEUT#  4.88 (E)  !: Data is abnormal (E): External lab result  Latest Reference Range & Units 10/04/22 00:00  Sodium 137 - 147  140 (E)  Potassium 3.5 - 5.1 mEq/L 4.1 (E)  Chloride 99 - 108  103 (E)  CO2 13 - 22  30 ! (E)  Glucose  123 (E)  BUN 4 - 21  25 ! (E)  Creatinine 0.6 - 1.3  1.6 ! (E)  Calcium 8.7 - 10.7  9.5 (E)  Alkaline Phosphatase 25 - 125  65 (E)  Albumin  3.5 - 5.0  3.7 (E)  AST 14 - 40  37 (E)  ALT 10 - 40 U/L 23 (E)  Bilirubin, Total  0.7 (E)  !: Data is abnormal (E): External lab result  ASSESSMENT & PLAN:  Assessment/Plan:   A 82 y.o. male with known metastatic FGFR2 mutation positive cholangiocarcinoma.  Clinically, the patient appears to be doing well. He will continue taking pemigatinib daily, every 2 out of 3 weeks.  I am pleased with how well this gentleman has tolerated his oral therapy.  I will see him back in 6 months for repeat clinical assessment..  The patient understands all the plans discussed today and is in agreement with them.  Kayle Correa Kirby Funk, MD

## 2022-10-04 ENCOUNTER — Telehealth: Payer: Self-pay | Admitting: Oncology

## 2022-10-04 ENCOUNTER — Inpatient Hospital Stay: Payer: Medicare Other

## 2022-10-04 ENCOUNTER — Inpatient Hospital Stay: Payer: Medicare Other | Attending: Oncology | Admitting: Oncology

## 2022-10-04 ENCOUNTER — Other Ambulatory Visit: Payer: Self-pay | Admitting: Oncology

## 2022-10-04 VITALS — BP 92/50 | HR 74 | Temp 98.2°F | Resp 16 | Ht 74.0 in | Wt 205.5 lb

## 2022-10-04 DIAGNOSIS — C240 Malignant neoplasm of extrahepatic bile duct: Secondary | ICD-10-CM

## 2022-10-04 DIAGNOSIS — C221 Intrahepatic bile duct carcinoma: Secondary | ICD-10-CM

## 2022-10-04 LAB — HEPATIC FUNCTION PANEL
ALT: 23 U/L (ref 10–40)
AST: 37 (ref 14–40)
Alkaline Phosphatase: 65 (ref 25–125)
Bilirubin, Total: 0.7

## 2022-10-04 LAB — COMPREHENSIVE METABOLIC PANEL
Albumin: 3.7 (ref 3.5–5.0)
Calcium: 9.5 (ref 8.7–10.7)

## 2022-10-04 LAB — BASIC METABOLIC PANEL
BUN: 25 — AB (ref 4–21)
CO2: 30 — AB (ref 13–22)
Chloride: 103 (ref 99–108)
Creatinine: 1.6 — AB (ref 0.6–1.3)
Glucose: 123
Potassium: 4.1 mEq/L (ref 3.5–5.1)
Sodium: 140 (ref 137–147)

## 2022-10-04 LAB — CBC AND DIFFERENTIAL
HCT: 35 — AB (ref 41–53)
Hemoglobin: 11.4 — AB (ref 13.5–17.5)
Neutrophils Absolute: 4.88
Platelets: 127 10*3/uL — AB (ref 150–400)
WBC: 7.4

## 2022-10-04 LAB — CBC: RBC: 3.81 — AB (ref 3.87–5.11)

## 2022-10-04 NOTE — Telephone Encounter (Signed)
Patient has been scheduled for follow-up visit per 10/04/22 LOS.  Pt given an appt calendar with date and time.  

## 2022-10-12 NOTE — Progress Notes (Signed)
Spoke with [employer] was able to get patient co-pay assistance and he received his shipment through them on 10/08/2022. We will reapply for free medication once grant is exhausted.

## 2022-11-15 ENCOUNTER — Inpatient Hospital Stay: Payer: Medicare Other | Admitting: Oncology

## 2022-11-15 ENCOUNTER — Inpatient Hospital Stay: Payer: Medicare Other

## 2022-11-20 NOTE — Progress Notes (Signed)
[facility]  [address] [place],  [place]  [address] [phone]  Clinic Day:  11/21/2022  Referring physician: Bonnita Nasuti, MD   HISTORY OF PRESENT ILLNESS:  The patient is a 82 y.o. 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  The patient is currently on pemigatinib for his disease management.  He also receives Retacrit every few weeks to keep his hemoglobin at or above 10.  He comes in today for routine follow-up.  Since his last visit, the patient has been doing okay.  He denies having any significant side effects with his pemigatinib.   However, he continues to lose weight.  His sister claims he does not do a good job eating.  Furthermore, his fluid intake has been admittedly substandard.    PHYSICAL EXAM:  Blood pressure 139/63, pulse 79, temperature 98.3 F (36.8 C), temperature source Oral, resp. rate 20, height '6\' 2"'$  (1.88 m), weight 196 lb 8 oz (89.1 kg), SpO2 92 %. Wt Readings from Last 3 Encounters:  11/21/22 196 lb 8 oz (89.1 kg)  10/04/22 205 lb 8 oz (93.2 kg)  08/29/22 214 lb 4.8 oz (97.2 kg)   Body mass index is 25.23 kg/m.  Performance status (ECOG): 1 - Symptomatic but completely ambulatory  Physical Exam Vitals and nursing note reviewed.  Constitutional:      General: He is not in acute distress.    Appearance: Normal appearance. He is normal weight.     Comments: He looks weaker and thinner.  He is in a wheelchair.    HENT:     Head: Normocephalic and atraumatic.     Mouth/Throat:     Mouth: Mucous membranes are moist.     Pharynx: Oropharynx is clear. No oropharyngeal exudate or posterior oropharyngeal erythema.  Eyes:     General: No scleral icterus.    Extraocular Movements: Extraocular movements intact.     Conjunctiva/sclera: Conjunctivae normal.     Pupils: Pupils are equal, round, and  reactive to light.  Cardiovascular:     Rate and Rhythm: Normal rate and regular rhythm.     Heart sounds: Normal heart sounds. No murmur heard.    No friction rub. No gallop.  Pulmonary:     Effort: Pulmonary effort is normal.     Breath sounds: Normal breath sounds. No wheezing, rhonchi or rales.  Abdominal:     General: Bowel sounds are normal. There is no distension.     Palpations: Abdomen is soft. There is no hepatomegaly, splenomegaly or mass.     Tenderness: There is no abdominal tenderness.  Musculoskeletal:        General: Normal range of motion.     Cervical back: Normal range of motion and neck supple. No tenderness.     Right lower leg: No edema.     Left lower leg: No edema.  Lymphadenopathy:     Cervical: No cervical adenopathy.     Upper Body:     Right upper body: No supraclavicular or axillary adenopathy.     Left upper body: No supraclavicular or axillary adenopathy.     Lower Body: No right inguinal adenopathy. No left inguinal adenopathy.  Skin:    General: Skin is warm and dry.     Coloration: Skin is not jaundiced.     Findings: No rash.  Neurological:     Mental Status: He is alert  and oriented to person, place, and time.     Cranial Nerves: No cranial nerve deficit.  Psychiatric:        Mood and Affect: Mood normal.        Behavior: Behavior normal.        Thought Content: Thought content normal.    LABS:  Latest Reference Range & Units 11/21/22 00:00  Sodium 137 - 147  138 (E)  Potassium 3.5 - 5.1 mEq/L 3.6 (E)  Chloride 99 - 108  105 (E)  CO2 13 - 22  27 ! (E)  Glucose  114 (E)  BUN 4 - 21  24 ! (E)  Creatinine 0.6 - 1.3  1.6 ! (E)  Calcium 8.7 - 10.7  9.3 (E)  Alkaline Phosphatase 25 - 125  83 (E)  Albumin 3.5 - 5.0  3.5 (E)  AST 14 - 40  36 (E)  ALT 10 - 40 U/L 19 (E)  Bilirubin, Total  0.9 (E)  !: Data is abnormal (E): External lab result  Latest Reference Range & Units 11/21/22 00:00  WBC  11.0 (E)  RBC 3.87 - 5.11  3.70 ! (E)   Hemoglobin 13.5 - 17.5  11.0 ! (E)  HCT 41 - 53  33 ! (E)  Platelets 150 - 400 K/uL 125 ! (E)  NEUT#  8.47 (E)  !: Data is abnormal (E): External lab result  ASSESSMENT & PLAN:  Assessment/Plan:   A 82 y.o. male with known metastatic FGFR2 mutation positive cholangiocarcinoma.  He knows to continue taking his pemigatinib as previously scheduled.  In clinic today, this patient's orthostatic blood pressure and pulse rate were consistent with dehydration.  Based upon this, I gave him 1.5 liters of normal saline to make him euvolemic.  I stressed to the patient the importance of ample fluid and caloric intake to maintain a decent level of baseline health.  I also encouraged his family to check on him more frequently and provide him as much nutrition as possible.  With respect to his disease, I will see him back in 6 weeks for repeat clinical assessment.  A repeat abdominal MRI will be done a day before his next visit to ascertain his new disease baseline.  The patient understands all the plans discussed today and is in agreement with them.  Dvora Buitron Macarthur Critchley, MD

## 2022-11-21 ENCOUNTER — Inpatient Hospital Stay (INDEPENDENT_AMBULATORY_CARE_PROVIDER_SITE_OTHER): Payer: Medicare Other | Admitting: Oncology

## 2022-11-21 ENCOUNTER — Ambulatory Visit: Payer: Medicare Other | Admitting: Oncology

## 2022-11-21 ENCOUNTER — Other Ambulatory Visit: Payer: Self-pay | Admitting: Oncology

## 2022-11-21 ENCOUNTER — Inpatient Hospital Stay: Payer: Medicare Other | Attending: Oncology

## 2022-11-21 ENCOUNTER — Other Ambulatory Visit: Payer: Self-pay | Admitting: Hematology and Oncology

## 2022-11-21 ENCOUNTER — Other Ambulatory Visit: Payer: Medicare Other

## 2022-11-21 VITALS — BP 139/63 | HR 79 | Temp 98.3°F | Resp 20 | Ht 74.0 in | Wt 196.5 lb

## 2022-11-21 DIAGNOSIS — C786 Secondary malignant neoplasm of retroperitoneum and peritoneum: Secondary | ICD-10-CM | POA: Diagnosis not present

## 2022-11-21 DIAGNOSIS — C221 Intrahepatic bile duct carcinoma: Secondary | ICD-10-CM | POA: Diagnosis not present

## 2022-11-21 DIAGNOSIS — C240 Malignant neoplasm of extrahepatic bile duct: Secondary | ICD-10-CM | POA: Diagnosis not present

## 2022-11-21 DIAGNOSIS — T451X5A Adverse effect of antineoplastic and immunosuppressive drugs, initial encounter: Secondary | ICD-10-CM | POA: Diagnosis not present

## 2022-11-21 DIAGNOSIS — D6481 Anemia due to antineoplastic chemotherapy: Secondary | ICD-10-CM | POA: Diagnosis not present

## 2022-11-21 LAB — BASIC METABOLIC PANEL
BUN: 24 — AB (ref 4–21)
CO2: 27 — AB (ref 13–22)
Chloride: 105 (ref 99–108)
Creatinine: 1.6 — AB (ref 0.6–1.3)
Glucose: 114
Potassium: 3.6 mEq/L (ref 3.5–5.1)
Sodium: 138 (ref 137–147)

## 2022-11-21 LAB — HEPATIC FUNCTION PANEL
ALT: 19 U/L (ref 10–40)
AST: 36 (ref 14–40)
Alkaline Phosphatase: 83 (ref 25–125)
Bilirubin, Total: 0.9

## 2022-11-21 LAB — CBC: RBC: 3.7 — AB (ref 3.87–5.11)

## 2022-11-21 LAB — COMPREHENSIVE METABOLIC PANEL
Albumin: 3.5 (ref 3.5–5.0)
Calcium: 9.3 (ref 8.7–10.7)

## 2022-11-21 LAB — CBC AND DIFFERENTIAL
HCT: 33 — AB (ref 41–53)
Hemoglobin: 11 — AB (ref 13.5–17.5)
Neutrophils Absolute: 8.47
Platelets: 125 10*3/uL — AB (ref 150–400)
WBC: 11

## 2022-11-21 MED ORDER — SODIUM CHLORIDE 0.9% FLUSH
10.0000 mL | Freq: Once | INTRAVENOUS | Status: AC | PRN
  Administered 2022-11-21: 10 mL

## 2022-11-21 MED ORDER — SODIUM CHLORIDE 0.9 % IV SOLN: Freq: Once | INTRAVENOUS | Status: AC

## 2022-11-22 ENCOUNTER — Telehealth: Payer: Self-pay | Admitting: Oncology

## 2022-11-22 NOTE — Telephone Encounter (Signed)
11/22/22 Spoke with patients son and schedule

## 2022-11-23 ENCOUNTER — Encounter: Payer: Self-pay | Admitting: Oncology

## 2022-11-27 ENCOUNTER — Telehealth: Payer: Self-pay | Admitting: *Deleted

## 2022-11-27 NOTE — Telephone Encounter (Signed)
11/22/22 spoke with patients son and scheduled MRI ABD on 12/31/22'@1015AM'$ 

## 2022-11-28 ENCOUNTER — Encounter: Payer: Self-pay | Admitting: Oncology

## 2022-11-28 ENCOUNTER — Other Ambulatory Visit: Payer: Self-pay | Admitting: Oncology

## 2022-11-28 DIAGNOSIS — T451X5A Adverse effect of antineoplastic and immunosuppressive drugs, initial encounter: Secondary | ICD-10-CM

## 2022-12-05 ENCOUNTER — Encounter: Payer: Self-pay | Admitting: Oncology

## 2022-12-18 ENCOUNTER — Encounter: Payer: Self-pay | Admitting: Oncology

## 2022-12-31 ENCOUNTER — Inpatient Hospital Stay: Payer: Medicare Other | Attending: Oncology

## 2022-12-31 ENCOUNTER — Encounter: Payer: Self-pay | Admitting: Oncology

## 2022-12-31 DIAGNOSIS — C221 Intrahepatic bile duct carcinoma: Secondary | ICD-10-CM | POA: Diagnosis present

## 2022-12-31 DIAGNOSIS — C786 Secondary malignant neoplasm of retroperitoneum and peritoneum: Secondary | ICD-10-CM | POA: Diagnosis not present

## 2022-12-31 LAB — CBC WITH DIFFERENTIAL (CANCER CENTER ONLY)
Abs Immature Granulocytes: 0.08 10*3/uL — ABNORMAL HIGH (ref 0.00–0.07)
Basophils Absolute: 0.1 10*3/uL (ref 0.0–0.1)
Basophils Relative: 1 %
Eosinophils Absolute: 0.3 10*3/uL (ref 0.0–0.5)
Eosinophils Relative: 2 %
HCT: 34.5 % — ABNORMAL LOW (ref 39.0–52.0)
Hemoglobin: 11 g/dL — ABNORMAL LOW (ref 13.0–17.0)
Immature Granulocytes: 1 %
Lymphocytes Relative: 16 %
Lymphs Abs: 1.7 10*3/uL (ref 0.7–4.0)
MCH: 30 pg (ref 26.0–34.0)
MCHC: 31.9 g/dL (ref 30.0–36.0)
MCV: 94 fL (ref 80.0–100.0)
Monocytes Absolute: 1 10*3/uL (ref 0.1–1.0)
Monocytes Relative: 9 %
Neutro Abs: 7.8 10*3/uL — ABNORMAL HIGH (ref 1.7–7.7)
Neutrophils Relative %: 71 %
Platelet Count: 176 10*3/uL (ref 150–400)
RBC: 3.67 MIL/uL — ABNORMAL LOW (ref 4.22–5.81)
RDW: 14.6 % (ref 11.5–15.5)
WBC Count: 10.9 10*3/uL — ABNORMAL HIGH (ref 4.0–10.5)
nRBC: 0 % (ref 0.0–0.2)

## 2022-12-31 LAB — CMP (CANCER CENTER ONLY)
ALT: 23 U/L (ref 0–44)
AST: 33 U/L (ref 15–41)
Albumin: 3.4 g/dL — ABNORMAL LOW (ref 3.5–5.0)
Alkaline Phosphatase: 61 U/L (ref 38–126)
Anion gap: 8 (ref 5–15)
BUN: 22 mg/dL (ref 8–23)
CO2: 29 mmol/L (ref 22–32)
Calcium: 9.4 mg/dL (ref 8.9–10.3)
Chloride: 104 mmol/L (ref 98–111)
Creatinine: 1.45 mg/dL — ABNORMAL HIGH (ref 0.61–1.24)
GFR, Estimated: 49 mL/min — ABNORMAL LOW (ref >60)
Glucose, Bld: 124 mg/dL — ABNORMAL HIGH (ref 70–99)
Potassium: 4 mmol/L (ref 3.5–5.1)
Sodium: 141 mmol/L (ref 135–145)
Total Bilirubin: 0.8 mg/dL (ref 0.3–1.2)
Total Protein: 6.9 g/dL (ref 6.5–8.1)

## 2023-01-01 NOTE — Progress Notes (Unsigned)
[facility]  [address] [place],  [place]  [address] [phone]  Clinic Day:  01/02/2023  Referring physician: Galvin Proffer, MD   HISTORY OF PRESENT ILLNESS:  The patient is a 82 y.o. 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  The patient is currently on pemigatinib for his disease management.  He also receives Retacrit every few weeks to keep his hemoglobin at or above 10.  He comes in today to go over his abdominal MRI results and their implications.   Since his last visit, the patient has been doing okay.  He denies having any significant side effects with his pemigatinib.   He denies having any new symptoms or findings which concern him for disease progression.  Of note, his appetite has picked up to where he eats constantly throughout the day.    PHYSICAL EXAM:  Blood pressure (!) 144/69, pulse 76, temperature 97.7 F (36.5 C), resp. rate 18, height 6\' 2"  (1.88 m), weight 206 lb (93.4 kg), SpO2 96 %. Wt Readings from Last 3 Encounters:  01/02/23 206 lb (93.4 kg)  11/21/22 196 lb 8 oz (89.1 kg)  10/04/22 205 lb 8 oz (93.2 kg)   Body mass index is 26.45 kg/m.  Performance status (ECOG): 1 - Symptomatic but completely ambulatory  Physical Exam Vitals and nursing note reviewed.  Constitutional:      General: He is not in acute distress.    Appearance: Normal appearance. He is normal weight.  HENT:     Head: Normocephalic and atraumatic.     Mouth/Throat:     Mouth: Mucous membranes are moist.     Pharynx: Oropharynx is clear. No oropharyngeal exudate or posterior oropharyngeal erythema.  Eyes:     General: No scleral icterus.    Extraocular Movements: Extraocular movements intact.     Conjunctiva/sclera: Conjunctivae normal.     Pupils: Pupils are equal, round, and reactive to light.  Cardiovascular:     Rate and  Rhythm: Normal rate and regular rhythm.     Heart sounds: Normal heart sounds. No murmur heard.    No friction rub. No gallop.  Pulmonary:     Effort: Pulmonary effort is normal.     Breath sounds: Normal breath sounds. No wheezing, rhonchi or rales.  Abdominal:     General: Bowel sounds are normal. There is no distension.     Palpations: Abdomen is soft. There is no hepatomegaly, splenomegaly or mass.     Tenderness: There is no abdominal tenderness.  Musculoskeletal:        General: Normal range of motion.     Cervical back: Normal range of motion and neck supple. No tenderness.     Right lower leg: No edema.     Left lower leg: No edema.  Lymphadenopathy:     Cervical: No cervical adenopathy.     Upper Body:     Right upper body: No supraclavicular or axillary adenopathy.     Left upper body: No supraclavicular or axillary adenopathy.     Lower Body: No right inguinal adenopathy. No left inguinal adenopathy.  Skin:    General: Skin is warm and dry.     Coloration: Skin is not jaundiced.     Findings: No rash.  Neurological:     Mental Status: He is alert and oriented to person, place, and time.     Cranial Nerves:  No cranial nerve deficit.  Psychiatric:        Mood and Affect: Mood normal.        Behavior: Behavior normal.        Thought Content: Thought content normal.   SCANS:  His abdominal MRI revealed the following:  FINDINGS: Motion degraded images.  Lower chest: Visualized lungs are grossly clear.  Hepatobiliary: Status post left hepatectomy. No suspicious/enhancing lesions on the current study.  Status post cholecystectomy. No intrahepatic or extrahepatic dilatation. Common duct measures 9 mm. No choledocholithiasis is seen.  Pancreas: Within normal limits.  Spleen: Within normal limits.  Adrenals/Urinary Tract: Adrenal glands are within normal limits.  Bilateral renal cysts, measuring up to 15 mm in the left lower kidney (series 18/image 30), benign  (Bosniak I). No hydronephrosis.  Stomach/Bowel: Stomach is within normal limits.  Visualized bowel is grossly unremarkable.  Vascular/Lymphatic: No evidence of abdominal aortic aneurysm.  No suspicious abdominal lymphadenopathy.  Other: No abdominal ascites.  Dominant peritoneal mass in the anterior lower abdomen, measuring 6.6 x 6.3 cm on coronal imaging (series 9/image 22), previously 6.9 x 5.4 cm, similar.  Aggregate peritoneal metastasis along the lateral left mid abdomen adjacent to the descending colon, measuring 5.4 x 2.4 cm (series 9/image 7), previously 5.9 x 2.7 cm, similar.  Musculoskeletal: No focal osseous lesions.  IMPRESSION: Motion degraded images.  Status post left hepatectomy. No suspicious/enhancing lesions on the current study.  Peritoneal metastases, as described above, overall grossly unchanged.  LABS:  Latest Reference Range & Units 12/31/22 09:37  WBC 4.0 - 10.5 K/uL 10.9 (H)  RBC 4.22 - 5.81 MIL/uL 3.67 (L)  Hemoglobin 13.0 - 17.0 g/dL 16.111.0 (L)  HCT 09.639.0 - 04.552.0 % 34.5 (L)  MCV 80.0 - 100.0 fL 94.0  MCH 26.0 - 34.0 pg 30.0  MCHC 30.0 - 36.0 g/dL 40.931.9  RDW 81.111.5 - 91.415.5 % 14.6  Platelets 150 - 400 K/uL 176  nRBC 0.0 - 0.2 % 0.0  Neutrophils % 71  Lymphocytes % 16  Monocytes Relative % 9  Eosinophil % 2  Basophil % 1  Immature Granulocytes % 1  (H): Data is abnormally high (L): Data is abnormally low  Latest Reference Range & Units 12/31/22 09:37  Sodium 135 - 145 mmol/L 141  Potassium 3.5 - 5.1 mmol/L 4.0  Chloride 98 - 111 mmol/L 104  CO2 22 - 32 mmol/L 29  Glucose 70 - 99 mg/dL 782124 (H)  BUN 8 - 23 mg/dL 22  Creatinine 9.560.61 - 2.131.24 mg/dL 0.861.45 (H)  Calcium 8.9 - 10.3 mg/dL 9.4  Anion gap 5 - 15  8  Alkaline Phosphatase 38 - 126 U/L 61  Albumin 3.5 - 5.0 g/dL 3.4 (L)  AST 15 - 41 U/L 33  ALT 0 - 44 U/L 23  Total Protein 6.5 - 8.1 g/dL 6.9  Total Bilirubin 0.3 - 1.2 mg/dL 0.8  GFR, Est Non African American >60 mL/min 49 (L)  (H):  Data is abnormally high (L): Data is abnormally low  ASSESSMENT & PLAN:  Assessment/Plan:   An 82 y.o. male with known metastatic FGFR2 mutation positive cholangiocarcinoma.  In clinic today, I went over all of his MRI images with him, for which he could see that his metastatic abdominal deposits have remained stable in size.  I am further comforted by the fact that there appears to be no focal liver involvement from his previous cholangiocarcinoma.  Overall, this gentleman appears to be doing very well.  As that is  the case, he will continue to take pemigatinib on a 2 weeks on-1 week basis.  Clinically, he appears to be doing very well.  As it is the case, I will see her back in 6 weeks for repeat clinical assessment.  The patient understands all the plans discussed today and is in agreement with them.  Darris Carachure Kirby Funk, MD

## 2023-01-02 ENCOUNTER — Inpatient Hospital Stay (INDEPENDENT_AMBULATORY_CARE_PROVIDER_SITE_OTHER): Payer: Medicare Other | Admitting: Oncology

## 2023-01-02 VITALS — BP 144/69 | HR 76 | Temp 97.7°F | Resp 18 | Ht 74.0 in | Wt 206.0 lb

## 2023-01-02 DIAGNOSIS — C240 Malignant neoplasm of extrahepatic bile duct: Secondary | ICD-10-CM | POA: Diagnosis not present

## 2023-01-07 ENCOUNTER — Encounter: Payer: Self-pay | Admitting: Oncology

## 2023-01-09 ENCOUNTER — Ambulatory Visit: Payer: Medicare Other | Attending: Cardiology | Admitting: Cardiology

## 2023-01-14 ENCOUNTER — Encounter: Payer: Self-pay | Admitting: Cardiology

## 2023-01-17 ENCOUNTER — Encounter: Payer: Self-pay | Admitting: Oncology

## 2023-01-17 NOTE — Telephone Encounter (Signed)
Done

## 2023-02-10 NOTE — Progress Notes (Signed)
Cardiology Office Note:    Date:  02/12/2023   ID:  Gary [name], DOB December 24, 1940, MRN [id number]  PCP:  Galvin Proffer, MD    [facility] Providers Cardiologist:  Gypsy Balsam, MD     Referring MD: Galvin Proffer, MD   CC: follow up CAD  History of Present Illness:    Gary [name] is a 82 y.o. 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  LHC 2021 which revealed multivessel CAD, mid circumflex to distal circumflex lesion was 85% stenosed, successful DES was placed to this lesion.  He wore a monitor in September 2022 which showed an average heart rate of 75 bpm, predominant underlying rhythm was sinus rhythm.  1 run of SVT, triggered events were associated with sinus rhythm and sinus tachycardia.  Most recent echocardiogram in February 2023 revealed an EF of 45 to 50%, positive RWMA, mild LVH, grade 1 DD, mild MR, mild dilatation of the ascending aorta at 40 mm.  Most recently he was evaluated by Dr. Bing Matter in October 2023, he had hypotension, which was bothersome for him, overall stable from a cardiac perspective.   He presents today for follow-up of his CAD.  States he has been feeling poorly, has been dealing with metastatic cancer.  He was evaluated by his PCP for orthostasis and falls, his Coreg was stopped, he does feel better since this was discontinued.  He checks his blood pressure daily at home states they are typically well-controlled as they are in the office today.  There is some confusion with his medication, he tells me that he has some issues ongoing with his memory.  He has apparently been out of his Lasix for a few weeks.  He endorses some shortness of breath with exertion.  He reports 1 episode of chest pain a  few months ago that he took 1 nitroglycerin for with resolution of symptoms. He denies palpitations, dyspnea, pnd, orthopnea, n, v, dizziness, syncope, edema, weight gain, or early satiety.    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   Chronic obstructive pulmonary disease (HCC) 12/13/2020   Chronic pain syndrome 12/13/2020   Chronic systolic heart failure (HCC) 12/13/2020   Coronary artery disease with PTCA and stenting of mid and distal circumflex artery on 07/11/2020 07/29/2020   Diastolic congestive heart failure (HCC) 04/24/2019   Dizziness 08/30/2020   Dyspnea on exertion 04/24/2019   Encounter for therapeutic drug level monitoring 06/15/2020   Essential hypertension 04/24/2019   Gastro-esophageal reflux disease without esophagitis 12/13/2020   Generalized anxiety disorder 12/13/2020   Hyperglycemia due to type 2 diabetes mellitus (HCC) 12/13/2020   Hypertension    Hypothyroidism 12/13/2020   Liver tumor 12/27/2017   Formatting of this note might be different from the original. 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   Memory loss 08/30/2020   Migraine without aura, not intractable, without status migrainosus 08/30/2020  Mild intermittent asthma 12/13/2020   Mild major depression, single episode (HCC) 12/13/2020   Mixed hyperlipidemia 12/13/2020   NSTEMI (non-ST elevated myocardial infarction) (HCC) 07/09/2020   Orthostatic hypotension 10/20/2020   Other long term (current) drug therapy 12/13/2020   Other vitamin B12 deficiency anemias 12/13/2020   Pain in left hip 12/13/2020   Preop cardiovascular exam 06/15/2020   Primary  generalized (osteo)arthritis 08/30/2020   Type 2 diabetes mellitus without complications (HCC) 12/13/2020   Unsteadiness on feet 08/30/2020   UTI (urinary tract infection) 01/08/2018   Vitamin D deficiency 12/13/2020    Past Surgical History:  Procedure Laterality Date   BILE DUCT EXPLORATION     To remove cancer   CORONARY STENT INTERVENTION N/A 07/11/2020   Procedure: CORONARY STENT INTERVENTION;  Surgeon: Marykay Lex, MD;  Location: [facility] INVASIVE CV LAB;  Service: Cardiovascular;  Laterality: N/A;   LEFT HEART CATH AND CORONARY ANGIOGRAPHY N/A 07/11/2020   Procedure: LEFT HEART CATH AND CORONARY ANGIOGRAPHY;  Surgeon: Marykay Lex, MD;  Location: [facility] INVASIVE CV LAB;  Service: Cardiovascular;  Laterality: N/A;   MOHS SURGERY     Basil cell on nose    Current Medications: Current Meds  Medication Sig   ALPRAZolam (XANAX) 0.5 MG tablet Take 0.5 mg by mouth 3 (three) times daily as needed for anxiety.    aspirin EC 81 MG tablet Take 1 tablet (81 mg total) by mouth daily. Swallow whole.   atorvastatin (LIPITOR) 10 MG tablet Take 10 mg by mouth daily.   buPROPion (WELLBUTRIN XL) 300 MG 24 hr tablet Take 1 tablet by mouth daily. For 90 days   calcium carbonate (TUMS - DOSED IN MG ELEMENTAL CALCIUM) 500 MG chewable tablet Chew 1 tablet by mouth in the morning and at bedtime. Total of 1200 mg daily   clopidogrel (PLAVIX) 75 MG tablet Take 75 mg by mouth daily.   ferrous sulfate 325 (65 FE) MG tablet Take 325 mg by mouth daily with breakfast.   folic acid (FOLVITE) 1 MG tablet Take 1 tablet (1 mg total) by mouth daily.   furosemide (LASIX) 20 MG tablet Take 1 tablet (20 mg total) by mouth daily.   HYDROcodone-acetaminophen (NORCO) 10-325 MG tablet 1 tablet as needed   ketoconazole (NIZORAL) 2 % cream 1 application   magnesium citrate SOLN Take 1 Bottle by mouth once. Pt to take 1/2 bottle. If no results in 1hr, take the other 1/2.   montelukast (SINGULAIR) 10 MG tablet Take 1 tablet by  mouth daily.   Multiple Vitamin (MULTIVITAMIN) tablet Take 1 tablet by mouth daily. Unknown Strength per patient   nitroGLYCERIN (NITROSTAT) 0.4 MG SL tablet Place 1 tablet (0.4 mg total) under the tongue every 5 (five) minutes as needed for chest pain.   Omega-3 Fatty Acids (FISH OIL) 1200 MG CPDR Take 1 tablet by mouth daily.   omeprazole (PRILOSEC) 40 MG capsule Take 1 capsule by mouth daily as needed (Acid reflux).   ondansetron (ZOFRAN) 4 MG tablet TAKE 1 TABLET BY MOUTH EVERY 4 HOURS AS NEEDED FOR NAUSEA OR VOMITING   pemigatinib (PEMAZYRE) 9 MG tablet Take 1 tablet (9 mg total) by mouth daily. Take at approximately the same time each day. Take for 14 days on, 7 days off, repeat every 21d.   senna (SENOKOT) 8.6 MG TABS tablet Take 1 tablet by mouth in the morning and at bedtime. Can increase to 2 tabs twice daily if needed.   tamsulosin (FLOMAX) 0.4 MG CAPS  capsule Take 0.4 mg by mouth daily.    vitamin B-12 (CYANOCOBALAMIN) 250 MCG tablet Take 250 mcg by mouth daily.   Vitamin D, Ergocalciferol, 50 MCG (2000 UT) CAPS Take 1 tablet by mouth daily.   [DISCONTINUED] furosemide (LASIX) 20 MG tablet Take 20 mg by mouth daily as needed.     Allergies:   Heparin, Pork-derived products, and Amoxicillin   Social History   Socioeconomic History   Marital status: Widowed    Spouse name: Not on file   Number of children: Not on file   Years of education: Not on file   Highest education level: Not on file  Occupational History   Not on file  Tobacco Use   Smoking status: Never   Smokeless tobacco: Current    Types: Chew  Substance and Sexual Activity   Alcohol use: Not Currently   Drug use: Never   Sexual activity: Not on file  Other Topics Concern   Not on file  Social History Narrative   Not on file   Social Determinants of Health   Financial Resource Strain: Not on file  Food Insecurity: Not on file  Transportation Needs: Not on file  Physical Activity: Not on file  Stress:  Not on file  Social Connections: Not on file     Family History: The patient's family history includes Breast cancer in his sister and sister; Cancer in his father.  ROS:   Please see the history of present illness.     All other systems reviewed and are negative.  EKGs/Labs/Other Studies Reviewed:    The following studies were reviewed today: Cardiac Studies & Procedures   CARDIAC CATHETERIZATION  CARDIAC CATHETERIZATION 07/11/2020  Narrative  Mid Cx to Dist Cx lesion is 85% stenosed.  A drug-eluting stent was successfully placed using a STENT RESOLUTE ONYX 2.5X12. Postdilated 2.65 mm  Post intervention, there is a 0% residual stenosis.  -------------  Suezanne Jacquet LM lesion is 25% stenosed.  Mid LM to Dist LM lesion is 25% stenosed with 60% stenosed side branch in Ost Cx to Prox Cx. This involves the ostium of Ramus and 1st Mrg  Ramus lesion is 60% stenosed. 1st Mrg lesion is 40% stenosed.  Prox LAD lesion is 60% stenosed -discrete eccentric lesion  Mid LAD lesion is 60% stenosed -> focal concentric lesion in the bend  Prox RCA lesion is 15% stenosed.  SUMMARY  Multivessel CAD:  Clear CULPRIT LESION noted as 85% ulcerated mLCx(AVG Cx), -->  Successful DES PCI reducing to 0%: Resolute Onyx DES 2.5 mm x 12 mm - post-dilated to 2.65 mm  Tifurcation AVG Cx-RI-1stMrg with each branch ~50-60%, prox LAD focal /eccentric ~60%,  mid LAD short ~60-65% concentric lesion.  Normal LVEDP - Echo showed mildly reduced LVEF with Inferolateral WMA.   RECOMMENDATIONS  Transfer to 6E post PCI.  Continue to treat residual disease medically to allow recovery from recent surgery.   Bryan Lemma, MD  Findings Coronary Findings Diagnostic  Dominance: Right  Left Main Ost LM lesion is 25% stenosed. The lesion is focal and concentric. Mid LM to Dist LM lesion is 25% stenosed with 60% stenosed side branch in Ost Cx to Prox Cx. The lesion is located at the bifurcation, focal,  discrete and concentric. Essentially trifurcation almost possibly from distal left main  Left Anterior Descending Prox LAD lesion is 60% stenosed. The lesion is discrete and eccentric. Mid LAD lesion is 60% stenosed. The lesion is located at the bend, focal and concentric.  Second Diagonal Branch Vessel is small in size.  Ramus Intermedius Vessel is small. Ramus lesion is 60% stenosed. The lesion is focal.  Left Circumflex Vessel is moderate in size. Mid Cx to Dist Cx lesion is 85% stenosed. Vessel is the culprit lesion. The lesion is located proximal to the major branch, eccentric, irregular and ulcerative.  First Obtuse Marginal Branch 1st Mrg lesion is 40% stenosed. The lesion is located at the bifurcation and focal.  Second Obtuse Marginal Branch Vessel is large in size.  Right Coronary Artery Vessel was injected. Vessel is large. The vessel is moderately tortuous. Prox RCA lesion is 15% stenosed. The lesion is located at the bend.  Acute Marginal Branch Vessel is small in size. Course this is a tandem PDA  Right Ventricular Branch Vessel is small in size.  Right Posterior Descending Artery Vessel is small in size.  Right Posterior Atrioventricular Artery Vessel is small in size.  Second Right Posterolateral Branch Vessel is small in size.  Intervention  Mid Cx to Dist Cx lesion Stent Lesion length:  10 mm. CATH VISTA GUIDE 6FR XBLAD3.5 guide catheter was inserted. Lesion crossed with guidewire using a WIRE RUNTHROUGH .[id number]. Pre-stent angioplasty was performed using a BALLOON SAPPHIRE 2.5X12. Maximum pressure:  8 atm. Inflation time:  20 sec. 2 inflations A drug-eluting stent was successfully placed using a STENT RESOLUTE ONYX 2.5X12. Unable to advance 15 mm stent Maximum pressure: 16 atm. Inflation time: 30 sec. Minimum lumen area:  2.7 mm. Stent strut is well apposed. Post-stent angioplasty was not performed. Post-Intervention Lesion Assessment The  intervention was successful. Pre-interventional TIMI flow is 3. Post-intervention TIMI flow is 3. Treated lesion length:  12 mm. No complications occurred at this lesion. There is a 0% residual stenosis post intervention.   STRESS TESTS  MYOCARDIAL PERFUSION IMAGING 07/15/2019  Narrative  The left ventricular ejection fraction is mildly decreased (45-54%).  Nuclear stress EF: 50%.  Defect 1: There is a medium defect of mild severity present in the basal anteroseptal location.  The study is normal.  This is a low risk study.  The fixed perfusion defect with normal wall motion in the basal anteroseptal wall is likely due to soft tissue attenuation.   ECHOCARDIOGRAM  ECHOCARDIOGRAM COMPLETE 11/21/2021  Narrative ECHOCARDIOGRAM REPORT    Patient Name:   HRAG LITAKER Date of Exam: 11/21/2021 Medical Rec #:  [id number]              Height:       74.0 in Accession #:    [id number]             Weight:       243.4 lb Date of Birth:  01-30-1941              BSA:          2.363 m Patient Age:    88 years               BP:           118/74 mmHg Patient Gender: M                      HR:           61 bpm. Exam Location:  [facility]  Procedure: 2D Echo, Cardiac Doppler, Color Doppler and Strain Analysis  Indications:    Ischemic cardiomyopathy [I25.5 (ICD-10-CM)]; Cholangiocarcinoma of liver (HCC) [C22.1 (ICD-10-CM)]  History:        Patient  has prior history of Echocardiogram examinations, most recent 07/10/2020. CHF, Previous Myocardial Infarction and CAD, COPD and Abdominal aortic ectasia, Signs/Symptoms:Orthostatic hypotension; Risk Factors:Hypertension and Diabetes.  Sonographer:    Louie Boston RDCS Referring Phys: [phone] Marveen Reeks KRASOWSKI  IMPRESSIONS   1. Left ventricular ejection fraction, by estimation, is 45 to 50%. The left ventricle has mildly decreased function. The left ventricle demonstrates regional wall motion abnormalities (see scoring diagram/findings  for description). There is mild left ventricular hypertrophy. Left ventricular diastolic parameters are consistent with Grade I diastolic dysfunction (impaired relaxation). There is mild hypokinesis of the left ventricular, entire inferior wall. 2. Right ventricular systolic function is normal. The right ventricular size is normal. There is normal pulmonary artery systolic pressure. 3. Left atrial size was mildly dilated. 4. The mitral valve is normal in structure. Mild mitral valve regurgitation. No evidence of mitral stenosis. 5. The aortic valve is normal in structure. Aortic valve regurgitation is not visualized. No aortic stenosis is present. 6. There is mild dilatation of the ascending aorta, measuring 40 mm. 7. The inferior vena cava is normal in size with greater than 50% respiratory variability, suggesting right atrial pressure of 3 mmHg.  FINDINGS Left Ventricle: Left ventricular ejection fraction, by estimation, is 45 to 50%. The left ventricle has mildly decreased function. The left ventricle demonstrates regional wall motion abnormalities. Mild hypokinesis of the left ventricular, entire inferior wall. The left ventricular internal cavity size was normal in size. There is mild left ventricular hypertrophy. Left ventricular diastolic parameters are consistent with Grade I diastolic dysfunction (impaired relaxation).  Right Ventricle: The right ventricular size is normal. No increase in right ventricular wall thickness. Right ventricular systolic function is normal. There is normal pulmonary artery systolic pressure. The tricuspid regurgitant velocity is 2.49 m/s, and with an assumed right atrial pressure of 3 mmHg, the estimated right ventricular systolic pressure is 27.8 mmHg.  Left Atrium: Left atrial size was mildly dilated.  Right Atrium: Right atrial size was normal in size.  Pericardium: There is no evidence of pericardial effusion.  Mitral Valve: The mitral valve is normal in  structure. Mild mitral valve regurgitation. No evidence of mitral valve stenosis.  Tricuspid Valve: The tricuspid valve is normal in structure. Tricuspid valve regurgitation is not demonstrated. No evidence of tricuspid stenosis.  Aortic Valve: The aortic valve is normal in structure. Aortic valve regurgitation is not visualized. No aortic stenosis is present.  Pulmonic Valve: The pulmonic valve was normal in structure. Pulmonic valve regurgitation is not visualized. No evidence of pulmonic stenosis.  Aorta: The aortic root is normal in size and structure. There is mild dilatation of the ascending aorta, measuring 40 mm.  Venous: The inferior vena cava is normal in size with greater than 50% respiratory variability, suggesting right atrial pressure of 3 mmHg.  IAS/Shunts: No atrial level shunt detected by color flow Doppler.   LEFT VENTRICLE PLAX 2D LVIDd:         5.40 cm      Diastology LVIDs:         3.50 cm      LV e' medial:    4.79 cm/s LV PW:         1.40 cm      LV E/e' medial:  15.6 LV IVS:        1.40 cm      LV e' lateral:   8.38 cm/s LVOT diam:     2.50 cm      LV E/e'  lateral: 8.9 LV SV:         81 LV SV Index:   34 LVOT Area:     4.91 cm  LV Volumes (MOD) LV vol d, MOD A2C: 127.0 ml LV vol d, MOD A4C: 134.0 ml LV vol s, MOD A2C: 69.5 ml LV vol s, MOD A4C: 73.5 ml LV SV MOD A2C:     57.5 ml LV SV MOD A4C:     134.0 ml LV SV MOD BP:      62.9 ml  RIGHT VENTRICLE RV S prime:     14.30 cm/s TAPSE (M-mode): 2.9 cm  LEFT ATRIUM              Index        RIGHT ATRIUM           Index LA diam:        4.00 cm  1.69 cm/m   RA Area:     16.40 cm LA Vol (A2C):   109.0 ml 46.13 ml/m  RA Volume:   45.30 ml  19.17 ml/m LA Vol (A4C):   72.3 ml  30.60 ml/m LA Biplane Vol: 90.0 ml  38.09 ml/m AORTIC VALVE LVOT Vmax:   62.10 cm/s LVOT Vmean:  41.400 cm/s LVOT VTI:    0.166 m  AORTA Ao Root diam: 3.80 cm Ao Asc diam:  4.00 cm  MITRAL VALVE               TRICUSPID  VALVE MV Area (PHT): 3.77 cm    TR Peak grad:   24.8 mmHg MV Decel Time: 201 msec    TR Vmax:        249.00 cm/s MV E velocity: 74.60 cm/s MV A velocity: 92.10 cm/s  SHUNTS MV E/A ratio:  0.81        Systemic VTI:  0.17 m Systemic Diam: 2.50 cm  Gypsy Balsam MD Electronically signed by Gypsy Balsam MD Signature Date/Time: 11/21/2021/3:59:26 PM    Final    MONITORS  LONG TERM MONITOR (3-14 DAYS) 05/30/2021  Narrative Patch Wear Time:  8 days and 21 hours (2022-08-18T16:00:53-0400 to 2022-08-27T13:47:01-399)  Patient had a min HR of 49 bpm, max HR of 144 bpm, and avg HR of 75 bpm. Predominant underlying rhythm was Sinus Rhythm. 1 run of Supraventricular Tachycardia occurred lasting 9 beats with a max rate of 144 bpm (avg 136 bpm). Isolated SVEs were rare (<1.0%), SVE Couplets were rare (<1.0%), and SVE Triplets were rare (<1.0%). Isolated VEs were frequent (6.8%, 65429), VE Couplets were rare (<1.0%, 522), and VE Triplets were rare (<1.0%, 10). Ventricular Bigeminy and Trigeminy were present.  Summary conclusions: 1 supraventricular tachycardia at 9 beats at rate of 144, Total of 25 triggered event showing sinus rhythm and sinus tachycardia            EKG:  EKG is  ordered today.  The ekg ordered today demonstrates normal sinus rhythm, heart rate 69 bpm, consistent with prior EKG tracings.  Recent Labs: 12/31/2022: ALT 23; BUN 22; Creatinine 1.45; Hemoglobin 11.0; Platelet Count 176; Potassium 4.0; Sodium 141  Recent Lipid Panel    Component Value Date/Time   CHOL 139 07/09/2020 0354   TRIG 255 (H) 07/09/2020 0354   HDL 33 (L) 07/09/2020 0354   CHOLHDL 4.2 07/09/2020 0354   VLDL 51 (H) 07/09/2020 0354   LDLCALC 55 07/09/2020 0354     Risk Assessment/Calculations:  Physical Exam:    VS:  BP 120/80 (BP Location: Right Arm, Patient Position: Sitting, Cuff Size: Normal)   Pulse 69   Ht 6\' 2"  (1.88 m)   Wt 205 lb (93 kg)   SpO2 97%   BMI 26.32  kg/m     Wt Readings from Last 3 Encounters:  02/12/23 205 lb (93 kg)  01/02/23 206 lb (93.4 kg)  11/21/22 196 lb 8 oz (89.1 kg)     GEN: ill appearing, no acute distress, pale  HEENT: Normal NECK: No JVD; No carotid bruits LYMPHATICS: No lymphadenopathy CARDIAC: RRR, no murmurs, rubs, gallops RESPIRATORY:  Clear to auscultation without wheezing or rhonchi. + Rales RLL. ABDOMEN: Soft, non-tender, non-distended MUSCULOSKELETAL:  No edema; No deformity  SKIN: Warm and dry NEUROLOGIC:  Alert and oriented x 3 PSYCHIATRIC:  Normal affect   ASSESSMENT:    1. Coronary artery disease involving native coronary artery of native heart without angina pectoris   2. Ischemic cardiomyopathy   3. Primary hypertension   4. Chronic combined systolic and diastolic congestive heart failure (HCC)   5. Ascending aorta dilatation (HCC)    PLAN:    In order of problems listed above:  CAD-s/p DES x 1 to mid and distal cx in 2021.  1 episode of chest pain a few months ago that he took 1 nitroglycerin for, he has not had any recurrence of chest pain. Stable with no anginal symptoms. No indication for ischemic evaluation.  Discussed with Dr. Bing Matter, will discontinue his Plavix, continue aspirin.  Was previously on Coreg however this has been discontinued secondary to hypotension and dizziness by his PCP.  Continue Lipitor 10 mg daily. Ischemic cardiomyopathy/HFmrEF-echo in February 2023 revealed EF 45 to 50%, positive RWMA, mild LVH, grade 1 DD, mild dilatation of ascending aorta.  NYHA class II, euvolemic although rales are appreciated upon auscultation.  He has been out of his Lasix for several weeks, will refill and he will start taking again.  Most recent creatinine 1.45.  He will see his oncologist in 9 days, states they will check his kidney function at that time.  Not on a beta-blocker secondary to dizziness and hypotension.  Blood pressure cannot tolerate ARNI.  Briefly discussed SGLT2 however he  already feels like he takes too many medications and does not want to proceed with this at this time. Has DOE, hard for him to decipher if worse or better than normal. Will repeat echo.  Hypertension-blood pressure is well-controlled 120/80, he has had episodes of hypotension in the past, along with associated falls.  Currently not on any antihypertensive agents. Ascending aorta dilatation-noted on echocardiogram 40 mm.  He recently had an MRI of his abdomen which did not reveal any aneurysm.  Will repeat echocardiogram for surveillance.  Disposition-stop Plavix.  Echocardiogram, return in 3 months.           Medication Adjustments/Labs and Tests Ordered: Current medicines are reviewed at length with the patient today.  Concerns regarding medicines are outlined above.  Orders Placed This Encounter  Procedures   EKG 12-Lead   ECHOCARDIOGRAM COMPLETE   Meds ordered this encounter  Medications   furosemide (LASIX) 20 MG tablet    Sig: Take 1 tablet (20 mg total) by mouth daily.    Dispense:  90 tablet    Refill:  3    Patient Instructions  Medication Instructions:  Your physician has recommended you make the following change in your medication:  Resume the Lasix 20 mg  once daily in the morning  *If you need a refill on your cardiac medications before your next appointment, please call your pharmacy*   Lab Work: NONE If you have labs (blood work) drawn today and your tests are completely normal, you will receive your results only by: MyChart Message (if you have MyChart) OR A paper copy in the mail If you have any lab test that is abnormal or we need to change your treatment, we will call you to review the results.   Testing/Procedures: Your physician has requested that you have an echocardiogram. Echocardiography is a painless test that uses sound waves to create images of your heart. It provides your doctor with information about the size and shape of your heart and how well  your heart's chambers and valves are working. This procedure takes approximately one hour. There are no restrictions for this procedure. Please do NOT wear cologne, perfume, aftershave, or lotions (deodorant is allowed). Please arrive 15 minutes prior to your appointment time.    Follow-Up: At [facility], you and your health needs are our priority.  As part of our continuing mission to provide you with exceptional heart care, we have created designated Provider Care Teams.  These Care Teams include your primary Cardiologist (physician) and Advanced Practice Providers (APPs -  Physician Assistants and Nurse Practitioners) who all work together to provide you with the care you need, when you need it.  We recommend signing up for the patient portal called "MyChart".  Sign up information is provided on this After Visit Summary.  MyChart is used to connect with patients for Virtual Visits (Telemedicine).  Patients are able to view lab/test results, encounter notes, upcoming appointments, etc.  Non-urgent messages can be sent to your provider as well.   To learn more about what you can do with MyChart, go to [email or web address].    Your next appointment:   3 month(s)  Provider:   Gypsy Balsam, MD    Other Instructions     Signed, Flossie Dibble, NP  02/12/2023 10:28 AM    [facility]

## 2023-02-12 ENCOUNTER — Ambulatory Visit: Payer: Medicare Other | Attending: Cardiology | Admitting: Cardiology

## 2023-02-12 ENCOUNTER — Encounter: Payer: Self-pay | Admitting: Cardiology

## 2023-02-12 VITALS — BP 120/80 | HR 69 | Ht 74.0 in | Wt 205.0 lb

## 2023-02-12 DIAGNOSIS — I255 Ischemic cardiomyopathy: Secondary | ICD-10-CM

## 2023-02-12 DIAGNOSIS — I10 Essential (primary) hypertension: Secondary | ICD-10-CM

## 2023-02-12 DIAGNOSIS — I5042 Chronic combined systolic (congestive) and diastolic (congestive) heart failure: Secondary | ICD-10-CM

## 2023-02-12 DIAGNOSIS — I77810 Thoracic aortic ectasia: Secondary | ICD-10-CM | POA: Diagnosis present

## 2023-02-12 DIAGNOSIS — I2510 Atherosclerotic heart disease of native coronary artery without angina pectoris: Secondary | ICD-10-CM

## 2023-02-12 MED ORDER — FUROSEMIDE 20 MG PO TABS: 20.0000 mg | ORAL_TABLET | Freq: Every day | ORAL | 3 refills | Status: DC

## 2023-02-12 NOTE — Patient Instructions (Signed)
Medication Instructions:  Your physician has recommended you make the following change in your medication:  Resume the Lasix 20 mg once daily in the morning  *If you need a refill on your cardiac medications before your next appointment, please call your pharmacy*   Lab Work: NONE If you have labs (blood work) drawn today and your tests are completely normal, you will receive your results only by: MyChart Message (if you have MyChart) OR A paper copy in the mail If you have any lab test that is abnormal or we need to change your treatment, we will call you to review the results.   Testing/Procedures: Your physician has requested that you have an echocardiogram. Echocardiography is a painless test that uses sound waves to create images of your heart. It provides your doctor with information about the size and shape of your heart and how well your heart's chambers and valves are working. This procedure takes approximately one hour. There are no restrictions for this procedure. Please do NOT wear cologne, perfume, aftershave, or lotions (deodorant is allowed). Please arrive 15 minutes prior to your appointment time.    Follow-Up: At [facility], you and your health needs are our priority.  As part of our continuing mission to provide you with exceptional heart care, we have created designated Provider Care Teams.  These Care Teams include your primary Cardiologist (physician) and Advanced Practice Providers (APPs -  Physician Assistants and Nurse Practitioners) who all work together to provide you with the care you need, when you need it.  We recommend signing up for the patient portal called "MyChart".  Sign up information is provided on this After Visit Summary.  MyChart is used to connect with patients for Virtual Visits (Telemedicine).  Patients are able to view lab/test results, encounter notes, upcoming appointments, etc.  Non-urgent messages can be sent to your provider as well.    To learn more about what you can do with MyChart, go to [email or web address].    Your next appointment:   3 month(s)  Provider:   Gypsy Balsam, MD    Other Instructions

## 2023-02-19 NOTE — Progress Notes (Unsigned)
[facility]  [address] [place],  [place]  [address] [phone]  Clinic Day:  02/20/2023  Referring physician: Galvin Proffer, MD   HISTORY OF PRESENT ILLNESS:  The patient is a 82 y.o. 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  The patient is currently on pemigatinib for his disease management, for which scans have shown no evidence of disease progression since he has been on this agent.  He comes in today for routine follow-up.  Since his last visit, the patient has been doing much better.  He attributes this to his increased caloric intake on a daily basis.  He does have occasional mouth soreness.  Otherwise, he denies having any significant side effects with his pemigatinib.   He denies having any new symptoms or findings which concern him for disease progression.    PHYSICAL EXAM:  Blood pressure (!) 117/58, pulse 98, temperature 98.4 F (36.9 C), resp. rate 18, height 6\' 2"  (1.88 m), weight 211 lb 12.8 oz (96.1 kg), SpO2 94 %. Wt Readings from Last 3 Encounters:  02/20/23 211 lb 12.8 oz (96.1 kg)  02/12/23 205 lb (93 kg)  01/02/23 206 lb (93.4 kg)   Body mass index is 27.19 kg/m.  Performance status (ECOG): 1 - Symptomatic but completely ambulatory  Physical Exam Vitals and nursing note reviewed.  Constitutional:      General: He is not in acute distress.    Appearance: Normal appearance. He is normal weight.  HENT:     Head: Normocephalic and atraumatic.     Mouth/Throat:     Mouth: Mucous membranes are moist.     Pharynx: Oropharynx is clear. No oropharyngeal exudate or posterior oropharyngeal erythema.  Eyes:     General: No scleral icterus.    Extraocular Movements: Extraocular movements intact.     Conjunctiva/sclera: Conjunctivae normal.     Pupils: Pupils are equal, round, and reactive to light.   Cardiovascular:     Rate and Rhythm: Normal rate and regular rhythm.     Heart sounds: Normal heart sounds. No murmur heard.    No friction rub. No gallop.  Pulmonary:     Effort: Pulmonary effort is normal.     Breath sounds: Normal breath sounds. No wheezing, rhonchi or rales.  Abdominal:     General: Bowel sounds are normal. There is no distension.     Palpations: Abdomen is soft. There is no hepatomegaly, splenomegaly or mass.     Tenderness: There is no abdominal tenderness.  Musculoskeletal:        General: Normal range of motion.     Cervical back: Normal range of motion and neck supple. No tenderness.     Right lower leg: No edema.     Left lower leg: No edema.  Lymphadenopathy:     Cervical: No cervical adenopathy.     Upper Body:     Right upper body: No supraclavicular or axillary adenopathy.     Left upper body: No supraclavicular or axillary adenopathy.     Lower Body: No right inguinal adenopathy. No left inguinal adenopathy.  Skin:    General: Skin is warm and dry.     Coloration: Skin is not jaundiced.     Findings: No rash.  Neurological:     Mental Status: He is alert and oriented to person, place, and time.     Cranial Nerves: No  cranial nerve deficit.  Psychiatric:        Mood and Affect: Mood normal.        Behavior: Behavior normal.        Thought Content: Thought content normal.    LABS:   ASSESSMENT & PLAN:  Assessment/Plan:   An 82 y.o. male with known metastatic FGFR2 mutation positive cholangiocarcinoma.  Overall, this gentleman appears to be doing much better.  As that is the case, he will continue to take pemigatinib on a 2 weeks on-1 week basis.  I will see him back in 6 weeks for repeat clinical assessment.  The patient understands all the plans discussed today and is in agreement with them.  Naylee Frankowski Kirby Funk, MD

## 2023-02-20 ENCOUNTER — Other Ambulatory Visit: Payer: Self-pay | Admitting: Oncology

## 2023-02-20 ENCOUNTER — Inpatient Hospital Stay: Payer: Medicare Other

## 2023-02-20 ENCOUNTER — Telehealth: Payer: Self-pay | Admitting: Oncology

## 2023-02-20 ENCOUNTER — Inpatient Hospital Stay: Payer: Medicare Other | Attending: Oncology | Admitting: Oncology

## 2023-02-20 VITALS — BP 117/58 | HR 98 | Temp 98.4°F | Resp 18 | Ht 74.0 in | Wt 211.8 lb

## 2023-02-20 DIAGNOSIS — C786 Secondary malignant neoplasm of retroperitoneum and peritoneum: Secondary | ICD-10-CM | POA: Insufficient documentation

## 2023-02-20 DIAGNOSIS — C787 Secondary malignant neoplasm of liver and intrahepatic bile duct: Secondary | ICD-10-CM | POA: Diagnosis not present

## 2023-02-20 DIAGNOSIS — C221 Intrahepatic bile duct carcinoma: Secondary | ICD-10-CM | POA: Insufficient documentation

## 2023-02-20 DIAGNOSIS — C240 Malignant neoplasm of extrahepatic bile duct: Secondary | ICD-10-CM

## 2023-02-20 LAB — CMP (CANCER CENTER ONLY)
ALT: 24 U/L (ref 0–44)
AST: 30 U/L (ref 15–41)
Albumin: 3.7 g/dL (ref 3.5–5.0)
Alkaline Phosphatase: 63 U/L (ref 38–126)
Anion gap: 8 (ref 5–15)
BUN: 26 mg/dL — ABNORMAL HIGH (ref 8–23)
CO2: 29 mmol/L (ref 22–32)
Calcium: 9.7 mg/dL (ref 8.9–10.3)
Chloride: 101 mmol/L (ref 98–111)
Creatinine: 1.56 mg/dL — ABNORMAL HIGH (ref 0.61–1.24)
GFR, Estimated: 45 mL/min — ABNORMAL LOW (ref >60)
Glucose, Bld: 105 mg/dL — ABNORMAL HIGH (ref 70–99)
Potassium: 4.3 mmol/L (ref 3.5–5.1)
Sodium: 138 mmol/L (ref 135–145)
Total Bilirubin: 0.9 mg/dL (ref 0.3–1.2)
Total Protein: 7.2 g/dL (ref 6.5–8.1)

## 2023-02-20 LAB — CBC AND DIFFERENTIAL
HCT: 35 — AB (ref 41–53)
Hemoglobin: 11.9 — AB (ref 13.5–17.5)
Neutrophils Absolute: 5.21
Platelets: 142 10*3/uL — AB (ref 150–400)
WBC: 8.4

## 2023-02-20 LAB — CBC: RBC: 3.87 (ref 3.87–5.11)

## 2023-02-20 NOTE — Telephone Encounter (Signed)
02/20/23 Spoke with patient and scheduled next appt on July 10@10am 

## 2023-03-04 ENCOUNTER — Telehealth: Payer: Self-pay | Admitting: Oncology

## 2023-03-04 NOTE — Telephone Encounter (Signed)
03/04/23 Left vm about next appt

## 2023-03-12 ENCOUNTER — Ambulatory Visit: Payer: Medicare Other | Attending: Cardiology

## 2023-03-12 ENCOUNTER — Other Ambulatory Visit: Payer: Medicare Other

## 2023-03-12 DIAGNOSIS — I5042 Chronic combined systolic (congestive) and diastolic (congestive) heart failure: Secondary | ICD-10-CM | POA: Insufficient documentation

## 2023-03-12 DIAGNOSIS — I2510 Atherosclerotic heart disease of native coronary artery without angina pectoris: Secondary | ICD-10-CM | POA: Diagnosis not present

## 2023-03-12 DIAGNOSIS — I10 Essential (primary) hypertension: Secondary | ICD-10-CM | POA: Insufficient documentation

## 2023-03-12 DIAGNOSIS — I255 Ischemic cardiomyopathy: Secondary | ICD-10-CM | POA: Insufficient documentation

## 2023-03-12 LAB — ECHOCARDIOGRAM COMPLETE
Calc EF: 45.6 %
S' Lateral: 4.7 cm
Single Plane A2C EF: 45.6 %
Single Plane A4C EF: 46 %

## 2023-03-13 ENCOUNTER — Other Ambulatory Visit: Payer: Self-pay | Admitting: Oncology

## 2023-03-13 DIAGNOSIS — T451X5A Adverse effect of antineoplastic and immunosuppressive drugs, initial encounter: Secondary | ICD-10-CM

## 2023-03-19 ENCOUNTER — Ambulatory Visit: Payer: Medicare Other | Attending: Cardiology | Admitting: Cardiology

## 2023-03-19 ENCOUNTER — Telehealth (HOSPITAL_COMMUNITY): Payer: Self-pay | Admitting: *Deleted

## 2023-03-19 ENCOUNTER — Encounter: Payer: Self-pay | Admitting: Cardiology

## 2023-03-19 VITALS — BP 114/75 | HR 87 | Ht 74.0 in | Wt 211.8 lb

## 2023-03-19 DIAGNOSIS — I255 Ischemic cardiomyopathy: Secondary | ICD-10-CM

## 2023-03-19 DIAGNOSIS — R072 Precordial pain: Secondary | ICD-10-CM | POA: Diagnosis present

## 2023-03-19 DIAGNOSIS — I10 Essential (primary) hypertension: Secondary | ICD-10-CM | POA: Diagnosis present

## 2023-03-19 DIAGNOSIS — I2510 Atherosclerotic heart disease of native coronary artery without angina pectoris: Secondary | ICD-10-CM

## 2023-03-19 DIAGNOSIS — I77810 Thoracic aortic ectasia: Secondary | ICD-10-CM | POA: Diagnosis present

## 2023-03-19 DIAGNOSIS — C240 Malignant neoplasm of extrahepatic bile duct: Secondary | ICD-10-CM | POA: Diagnosis present

## 2023-03-19 NOTE — Telephone Encounter (Signed)
Patient given detailed instructions per Myocardial Perfusion Study Information Sheet for the test on 03/21/23 Patient notified to arrive 15 minutes early and that it is imperative to arrive on time for appointment to keep from having the test rescheduled.  If you need to cancel or reschedule your appointment, please call the office within 24 hours of your appointment. . Patient verbalized understanding. Ardis Lawley Jacqueline, RN   

## 2023-03-19 NOTE — Telephone Encounter (Deleted)
Patient given detailed instructions per Myocardial Perfusion Study Information Sheet for the test on 03/21/23 Patient notified to arrive 15 minutes early and that it is imperative to arrive on time for appointment to keep from having the test rescheduled.  If you need to cancel or reschedule your appointment, please call the office within 24 hours of your appointment. . Patient verbalized understanding. Ricky Ala, RN

## 2023-03-19 NOTE — Progress Notes (Signed)
Cardiology Office Note:    Date:  03/19/2023   ID:  Gary [name], DOB March 13, 1941, MRN [id number]  PCP:  Galvin Proffer, MD   [facility] Providers Cardiologist:  Gypsy Balsam, MD     Referring MD: Galvin Proffer, MD   CC: follow up CAD  History of Present Illness:    Gary [name] is a 82 y.o. 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  LHC 2021 which revealed multivessel CAD, mid circumflex to distal circumflex lesion was 85% stenosed, successful DES was placed to this lesion.  He wore a monitor in September 2022 which showed an average heart rate of 75 bpm, predominant underlying rhythm was sinus rhythm.  1 run of SVT, triggered events were associated with sinus rhythm and sinus tachycardia.  Most recent echocardiogram in February 2023 revealed an EF of 45 to 50%, positive RWMA, mild LVH, grade 1 DD, mild MR, mild dilatation of the ascending aorta at 40 mm.  Most recently he was evaluated by Dr. Bing Matter in October 2023, he had hypotension, which was bothersome for him, overall stable from a cardiac perspective.   Was recently evaluated in our office with complaints of fatigue, hard for him to describe, recently diagnosed with metastatic cancer.  Had been evaluated by his PCP for orthostasis and falls, his Coreg was stopped and he was feeling somewhat better at this time.  He had ran out of his Lasix and we restarted this.  He presents today for an acute visit of fatigue, admits that this has been going on for " years" but feels like he needs further investigation.  He has some apparent memory issues, is not clear on what medications he is taking.  He has some Rales in his lung, states he has not been taking his Lasix.  He  questions if he needs a stress test since he has heart issues, he has had chest pain on a few occasion, he has uses nitroglycerin however he cannot remember if it helped his pain or not. He denies palpitations, dyspnea, pnd, orthopnea, n, v, dizziness, syncope, edema, weight gain, or early satiety.    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   Chronic obstructive pulmonary disease (HCC) 12/13/2020   Chronic pain syndrome 12/13/2020   Chronic systolic heart failure (HCC) 12/13/2020   Coronary artery disease with PTCA and stenting of mid and distal circumflex artery on 07/11/2020 07/29/2020   Diastolic congestive heart failure (HCC) 04/24/2019   Dizziness 08/30/2020   Dyspnea on exertion 04/24/2019   Encounter for therapeutic drug level monitoring 06/15/2020   Essential hypertension 04/24/2019   Gastro-esophageal reflux disease without esophagitis 12/13/2020   Generalized anxiety disorder 12/13/2020   Hyperglycemia due to type 2 diabetes mellitus (HCC) 12/13/2020   Hypertension    Hypothyroidism 12/13/2020   Liver tumor 12/27/2017   Formatting of this note might be different from the original. 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) 08/30/2020   Memory loss 08/30/2020   Migraine without aura, not intractable, without status migrainosus 08/30/2020   Mild intermittent asthma 12/13/2020   Mild major depression, single episode (HCC) 12/13/2020   Mixed hyperlipidemia 12/13/2020   NSTEMI (non-ST elevated myocardial infarction) (HCC) 07/09/2020   Orthostatic hypotension 10/20/2020   Other long term (current) drug therapy 12/13/2020   Other vitamin  B12 deficiency anemias 12/13/2020   Pain in left hip 12/13/2020   Preop cardiovascular exam 06/15/2020   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/30/2020   Type 2 diabetes mellitus without complications (HCC) 12/13/2020   Unsteadiness on feet 08/30/2020   UTI (urinary tract infection) 01/08/2018   Vitamin D deficiency 12/13/2020    Past Surgical History:  Procedure Laterality Date   BILE DUCT EXPLORATION     To remove cancer   CORONARY STENT INTERVENTION N/A 07/11/2020   Procedure: CORONARY STENT INTERVENTION;  Surgeon: Marykay Lex, MD;  Location: [facility] INVASIVE CV LAB;  Service: Cardiovascular;  Laterality: N/A;   LEFT HEART CATH AND CORONARY ANGIOGRAPHY N/A 07/11/2020   Procedure: LEFT HEART CATH AND CORONARY ANGIOGRAPHY;  Surgeon: Marykay Lex, MD;  Location: [facility] INVASIVE CV LAB;  Service: Cardiovascular;  Laterality: N/A;   MOHS SURGERY     Basil cell on nose    Current Medications: Current Meds  Medication Sig   ALPRAZolam (XANAX) 0.5 MG tablet Take 0.5 mg by mouth 3 (three) times daily as needed for anxiety.    aspirin EC 81 MG tablet Take 1 tablet (81 mg total) by mouth daily. Swallow whole.   atorvastatin (LIPITOR) 10 MG tablet Take 10 mg by mouth daily.   buPROPion (WELLBUTRIN XL) 300 MG 24 hr tablet Take 1 tablet by mouth daily. For 90 days   calcium carbonate (TUMS - DOSED IN MG ELEMENTAL CALCIUM) 500 MG chewable tablet Chew 1 tablet by mouth in the morning and at bedtime. Total of 1200 mg daily   clopidogrel (PLAVIX) 75 MG tablet Take 75 mg by mouth daily.   ferrous sulfate 325 (65 FE) MG tablet Take 325 mg by mouth daily with breakfast.   folic acid (FOLVITE) 1 MG tablet Take 1 tablet (1 mg total) by mouth daily.   furosemide (LASIX) 20 MG tablet Take 1 tablet (20 mg total) by mouth daily.   HYDROcodone-acetaminophen (NORCO) 10-325 MG tablet 1 tablet as needed   ketoconazole (NIZORAL) 2 % cream 1 application   magnesium citrate SOLN Take 1 Bottle by mouth once. Pt to take  1/2 bottle. If no results in 1hr, take the other 1/2.   montelukast (SINGULAIR) 10 MG tablet Take 1 tablet by mouth daily.   Multiple Vitamin (MULTIVITAMIN) tablet Take 1 tablet by mouth daily. Unknown Strength per patient   nitroGLYCERIN (NITROSTAT) 0.4 MG SL tablet Place 1 tablet (0.4 mg total) under the tongue every 5 (five) minutes as needed for chest pain.   Omega-3 Fatty Acids (FISH OIL) 1200 MG CPDR Take 1 tablet by mouth daily.   omeprazole (PRILOSEC) 40 MG capsule Take 1 capsule by mouth daily as needed (Acid reflux).   ondansetron (ZOFRAN) 4 MG tablet TAKE 1 TABLET BY MOUTH EVERY 4 HOURS AS NEEDED FOR NAUSEA OR VOMITING   pemigatinib (PEMAZYRE) 9 MG tablet Take 1 tablet (9 mg total) by mouth daily. Take at approximately the same time each day. Take for 14 days on, 7 days off, repeat every 21d.   Plecanatide (TRULANCE) 3 MG TABS Take 3 mg by  mouth 2 (two) times a week.   senna (SENOKOT) 8.6 MG TABS tablet Take 1 tablet by mouth in the morning and at bedtime. Can increase to 2 tabs twice daily if needed.   tamsulosin (FLOMAX) 0.4 MG CAPS capsule Take 0.4 mg by mouth daily.    vitamin B-12 (CYANOCOBALAMIN) 250 MCG tablet Take 250 mcg by mouth daily.   Vitamin D, Ergocalciferol, 50 MCG (2000 UT) CAPS Take 1 tablet by mouth daily.     Allergies:   Heparin, Pork-derived products, and Amoxicillin   Social History   Socioeconomic History   Marital status: Widowed    Spouse name: Not on file   Number of children: Not on file   Years of education: Not on file   Highest education level: Not on file  Occupational History   Not on file  Tobacco Use   Smoking status: Never   Smokeless tobacco: Current    Types: Chew  Substance and Sexual Activity   Alcohol use: Not Currently   Drug use: Never   Sexual activity: Not on file  Other Topics Concern   Not on file  Social History Narrative   Not on file   Social Determinants of Health   Financial Resource Strain: Not on file  Food  Insecurity: Not on file  Transportation Needs: Not on file  Physical Activity: Not on file  Stress: Not on file  Social Connections: Not on file     Family History: The patient's family history includes Breast cancer in his sister and sister; Cancer in his father.  ROS:   Please see the history of present illness.     All other systems reviewed and are negative.  EKGs/Labs/Other Studies Reviewed:    The following studies were reviewed today: Cardiac Studies & Procedures   CARDIAC CATHETERIZATION  CARDIAC CATHETERIZATION 07/11/2020  Narrative  Mid Cx to Dist Cx lesion is 85% stenosed.  A drug-eluting stent was successfully placed using a STENT RESOLUTE ONYX 2.5X12. Postdilated 2.65 mm  Post intervention, there is a 0% residual stenosis.  -------------  Suezanne Jacquet LM lesion is 25% stenosed.  Mid LM to Dist LM lesion is 25% stenosed with 60% stenosed side branch in Ost Cx to Prox Cx. This involves the ostium of Ramus and 1st Mrg  Ramus lesion is 60% stenosed. 1st Mrg lesion is 40% stenosed.  Prox LAD lesion is 60% stenosed -discrete eccentric lesion  Mid LAD lesion is 60% stenosed -> focal concentric lesion in the bend  Prox RCA lesion is 15% stenosed.  SUMMARY  Multivessel CAD:  Clear CULPRIT LESION noted as 85% ulcerated mLCx(AVG Cx), -->  Successful DES PCI reducing to 0%: Resolute Onyx DES 2.5 mm x 12 mm - post-dilated to 2.65 mm  Tifurcation AVG Cx-RI-1stMrg with each branch ~50-60%, prox LAD focal /eccentric ~60%,  mid LAD short ~60-65% concentric lesion.  Normal LVEDP - Echo showed mildly reduced LVEF with Inferolateral WMA.   RECOMMENDATIONS  Transfer to 6E post PCI.  Continue to treat residual disease medically to allow recovery from recent surgery.   Bryan Lemma, MD  Findings Coronary Findings Diagnostic  Dominance: Right  Left Main Ost LM lesion is 25% stenosed. The lesion is focal and concentric. Mid LM to Dist LM lesion is 25% stenosed  with 60% stenosed side branch in Ost Cx to Prox Cx. The lesion is located at the bifurcation, focal, discrete and concentric. Essentially trifurcation almost possibly from distal left main  Left Anterior Descending Prox LAD lesion  is 60% stenosed. The lesion is discrete and eccentric. Mid LAD lesion is 60% stenosed. The lesion is located at the bend, focal and concentric.  Second Diagonal Branch Vessel is small in size.  Ramus Intermedius Vessel is small. Ramus lesion is 60% stenosed. The lesion is focal.  Left Circumflex Vessel is moderate in size. Mid Cx to Dist Cx lesion is 85% stenosed. Vessel is the culprit lesion. The lesion is located proximal to the major branch, eccentric, irregular and ulcerative.  First Obtuse Marginal Branch 1st Mrg lesion is 40% stenosed. The lesion is located at the bifurcation and focal.  Second Obtuse Marginal Branch Vessel is large in size.  Right Coronary Artery Vessel was injected. Vessel is large. The vessel is moderately tortuous. Prox RCA lesion is 15% stenosed. The lesion is located at the bend.  Acute Marginal Branch Vessel is small in size. Course this is a tandem PDA  Right Ventricular Branch Vessel is small in size.  Right Posterior Descending Artery Vessel is small in size.  Right Posterior Atrioventricular Artery Vessel is small in size.  Second Right Posterolateral Branch Vessel is small in size.  Intervention  Mid Cx to Dist Cx lesion Stent Lesion length:  10 mm. CATH VISTA GUIDE 6FR XBLAD3.5 guide catheter was inserted. Lesion crossed with guidewire using a WIRE RUNTHROUGH .[id number]. Pre-stent angioplasty was performed using a BALLOON SAPPHIRE 2.5X12. Maximum pressure:  8 atm. Inflation time:  20 sec. 2 inflations A drug-eluting stent was successfully placed using a STENT RESOLUTE ONYX 2.5X12. Unable to advance 15 mm stent Maximum pressure: 16 atm. Inflation time: 30 sec. Minimum lumen area:  2.7 mm. Stent strut is well  apposed. Post-stent angioplasty was not performed. Post-Intervention Lesion Assessment The intervention was successful. Pre-interventional TIMI flow is 3. Post-intervention TIMI flow is 3. Treated lesion length:  12 mm. No complications occurred at this lesion. There is a 0% residual stenosis post intervention.   STRESS TESTS  MYOCARDIAL PERFUSION IMAGING 07/15/2019  Narrative  The left ventricular ejection fraction is mildly decreased (45-54%).  Nuclear stress EF: 50%.  Defect 1: There is a medium defect of mild severity present in the basal anteroseptal location.  The study is normal.  This is a low risk study.  The fixed perfusion defect with normal wall motion in the basal anteroseptal wall is likely due to soft tissue attenuation.   ECHOCARDIOGRAM  ECHOCARDIOGRAM COMPLETE 03/12/2023  Narrative ECHOCARDIOGRAM REPORT    Patient Name:   Gary [name] Date of Exam: 03/12/2023 Medical Rec #:  [id number]              Height:       74.0 in Accession #:    [id number]             Weight:       211.8 lb Date of Birth:  [birth year]              BSA:          2.227 m Patient Age:    82 years               BP:           117/58 mmHg Patient Gender: M                      HR:           73 bpm. Exam Location:  [employer] [facility]  Procedure: 2D Echo, Cardiac Doppler, Color Doppler and Strain  Analysis  Indications:    Ischemic cardiomyopathy [I25.5 (ICD-10-CM)]; Primary hypertension [I10 (ICD-10-CM)]; Coronary artery disease involving native coronary artery of native heart without angina pectoris [I25.10 (ICD-10-CM)]; Chronic combined systolic and diastolic congestive heart failure (HCC) [I50.42 (ICD-10-CM)]  History:        Patient has prior history of Echocardiogram examinations, most recent 11/21/2021. Cardiomyopathy and CHF, Previous Myocardial Infarction, Signs/Symptoms:Dizziness/Lightheadedness; Risk Factors:Hypertension, Diabetes and Dyslipidemia.  Sonographer:    Louie Boston  RDCS Referring Phys: [phone] Flossie Dibble   Sonographer Comments: Suboptimal subcostal window. IMPRESSIONS   1. Left ventricular ejection fraction, by estimation, is 55 to 60%. The left ventricle has normal function. The left ventricle has no regional wall motion abnormalities. Left ventricular diastolic parameters are consistent with Grade I diastolic dysfunction (impaired relaxation). 2. Right ventricular systolic function is normal. The right ventricular size is normal. 3. Left atrial size was moderately dilated. 4. The mitral valve is normal in structure. No evidence of mitral valve regurgitation. No evidence of mitral stenosis. 5. The aortic valve is normal in structure. Aortic valve regurgitation is not visualized. No aortic stenosis is present. 6. Aneurysm of the ascending aorta, measuring 41 mm. 7. The inferior vena cava is normal in size with greater than 50% respiratory variability, suggesting right atrial pressure of 3 mmHg.  FINDINGS Left Ventricle: Left ventricular ejection fraction, by estimation, is 55 to 60%. The left ventricle has normal function. The left ventricle has no regional wall motion abnormalities. The left ventricular internal cavity size was normal in size. There is no left ventricular hypertrophy. Left ventricular diastolic parameters are consistent with Grade I diastolic dysfunction (impaired relaxation).  Right Ventricle: The right ventricular size is normal. No increase in right ventricular wall thickness. Right ventricular systolic function is normal.  Left Atrium: Left atrial size was moderately dilated.  Right Atrium: Right atrial size was normal in size.  Pericardium: There is no evidence of pericardial effusion.  Mitral Valve: The mitral valve is normal in structure. No evidence of mitral valve regurgitation. No evidence of mitral valve stenosis.  Tricuspid Valve: The tricuspid valve is normal in structure. Tricuspid valve regurgitation is mild .  No evidence of tricuspid stenosis.  Aortic Valve: The aortic valve is normal in structure. Aortic valve regurgitation is not visualized. No aortic stenosis is present.  Pulmonic Valve: The pulmonic valve was normal in structure. Pulmonic valve regurgitation is not visualized. No evidence of pulmonic stenosis.  Aorta: The aortic root is normal in size and structure. There is an aneurysm involving the ascending aorta measuring 41 mm.  Venous: The inferior vena cava is normal in size with greater than 50% respiratory variability, suggesting right atrial pressure of 3 mmHg.  IAS/Shunts: No atrial level shunt detected by color flow Doppler.   LEFT VENTRICLE PLAX 2D LVIDd:         5.70 cm      Diastology LVIDs:         4.70 cm      LV e' medial:    5.98 cm/s LV PW:         1.10 cm      LV E/e' medial:  8.9 LV IVS:        1.10 cm      LV e' lateral:   8.81 cm/s LVOT diam:     2.50 cm      LV E/e' lateral: 6.0 LV SV:         84 LV SV Index:   38  LVOT Area:     4.91 cm  LV Volumes (MOD) LV vol d, MOD A2C: 122.0 ml LV vol d, MOD A4C: 145.0 ml LV vol s, MOD A2C: 66.4 ml LV vol s, MOD A4C: 78.3 ml LV SV MOD A2C:     55.6 ml LV SV MOD A4C:     145.0 ml LV SV MOD BP:      61.5 ml  RIGHT VENTRICLE RV Basal diam:  3.10 cm RV S prime:     8.59 cm/s TAPSE (M-mode): 1.9 cm  LEFT ATRIUM             Index        RIGHT ATRIUM           Index LA diam:        3.50 cm 1.57 cm/m   RA Area:     15.00 cm LA Vol (A2C):   94.9 ml 42.61 ml/m  RA Volume:   36.30 ml  16.30 ml/m LA Vol (A4C):   56.3 ml 25.28 ml/m LA Biplane Vol: 73.8 ml 33.14 ml/m AORTIC VALVE LVOT Vmax:   77.60 cm/s LVOT Vmean:  50.450 cm/s LVOT VTI:    0.172 m  AORTA Ao Root diam: 3.60 cm Ao Asc diam:  4.10 cm  MV E velocity: 53.00 cm/s  TRICUSPID VALVE MV A velocity: 87.80 cm/s  TR Peak grad:   23.4 mmHg MV E/A ratio:  0.60        TR Vmax:        242.00 cm/s  SHUNTS Systemic VTI:  0.17 m Systemic Diam: 2.50  cm  Belva Crome MD Electronically signed by Belva Crome MD Signature Date/Time: 03/12/2023/5:21:35 PM    Final    MONITORS  LONG TERM MONITOR (3-14 DAYS) 05/30/2021  Narrative Patch Wear Time:  8 days and 21 hours (2022-08-18T16:00:53-0400 to 2022-08-27T13:47:01-399)  Patient had a min HR of 49 bpm, max HR of 144 bpm, and avg HR of 75 bpm. Predominant underlying rhythm was Sinus Rhythm. 1 run of Supraventricular Tachycardia occurred lasting 9 beats with a max rate of 144 bpm (avg 136 bpm). Isolated SVEs were rare (<1.0%), SVE Couplets were rare (<1.0%), and SVE Triplets were rare (<1.0%). Isolated VEs were frequent (6.8%, 65429), VE Couplets were rare (<1.0%, 522), and VE Triplets were rare (<1.0%, 10). Ventricular Bigeminy and Trigeminy were present.  Summary conclusions: 1 supraventricular tachycardia at 9 beats at rate of 144, Total of 25 triggered event showing sinus rhythm and sinus tachycardia            EKG: NSR, HR 87 bpm  Recent Labs: 02/20/2023: ALT 24; BUN 26; Creatinine 1.56; Hemoglobin 11.9; Platelets 142; Potassium 4.3; Sodium 138  Recent Lipid Panel    Component Value Date/Time   CHOL 139 07/09/2020 0354   TRIG 255 (H) 07/09/2020 0354   HDL 33 (L) 07/09/2020 0354   CHOLHDL 4.2 07/09/2020 0354   VLDL 51 (H) 07/09/2020 0354   LDLCALC 55 07/09/2020 0354     Risk Assessment/Calculations:                Physical Exam:    VS:  BP 114/75 (BP Location: Right Arm, Patient Position: Sitting, Cuff Size: Normal)   Pulse 87   Ht 6\' 2"  (1.88 m)   Wt 211 lb 12.8 oz (96.1 kg)   SpO2 92%   BMI 27.19 kg/m     Wt Readings from Last 3 Encounters:  03/19/23 211 lb 12.8 oz (96.1 kg)  02/20/23 211 lb 12.8 oz (96.1 kg)  02/12/23 205 lb (93 kg)     GEN: ill appearing, no acute distress, pale  HEENT: Normal NECK: No JVD; No carotid bruits LYMPHATICS: No lymphadenopathy CARDIAC: RRR, no murmurs, rubs, gallops RESPIRATORY:  Clear to auscultation without  wheezing or rhonchi. + Rales RLL. ABDOMEN: Soft, non-tender, non-distended MUSCULOSKELETAL:  No edema; No deformity  SKIN: Warm and dry NEUROLOGIC:  Alert and oriented x 3 PSYCHIATRIC:  Normal affect   ASSESSMENT:    1. Coronary artery disease involving native coronary artery of native heart without angina pectoris   2. Primary hypertension   3. Precordial pain   4. Ischemic cardiomyopathy   5. Ascending aorta dilatation (HCC)     PLAN:    In order of problems listed above:  CAD-s/p DES x 1 to mid and distal cx in 2021.  He has had a few episodes of chest pain, taken his nitroglycerin, chest pain has mixed features, will arrange Lexiscan.  Continue aspirin 81 mg daily, beta-blocker was stopped secondary to fatigue and falls per PCP, continue NTG PRN-- has needed a few times.  Ischemic cardiomyopathy/HFmrEF-most recent echo on 03/12/2023 EF 55 to 60%, grade 1 DD, aneurysm of ascending aorta 41 mm. NYHA class II, euvolemic although rales are appreciated upon auscultation.  He has Lasix however does not been taking it, encouraged him to take it the next 2 mornings and then every other day.  Repeat BMET in 7 days. GDMT - not on BB secondary to fatigue and falls, Creatinine and orthostasis prohibiting further GDMT. Metastatic cholangiocarcinoma of the liver - advised recent testing revealed chest involvement, likely the driving factor behind his fatigue.  Hypertension-blood pressure is well-controlled 114/75, he has had episodes of hypotension in the past, along with associated falls.  Currently not on any antihypertensive agents. Ascending aorta dilatation-noted on echocardiogram 40 mm.  He recently had an MRI of his abdomen which did not reveal any aneurysm.  Repeat echo 03/12/23 41 mm.   Disposition- take lasix every other day; lexiscan; return 2 months with Dr. Bing Matter.        Informed Consent   Shared Decision Making/Informed Consent The risks [chest pain, shortness of breath, cardiac  arrhythmias, dizziness, blood pressure fluctuations, myocardial infarction, stroke/transient ischemic attack, nausea, vomiting, allergic reaction, radiation exposure, metallic taste sensation and life-threatening complications (estimated to be 1 in 10,000)], benefits (risk stratification, diagnosing coronary artery disease, treatment guidance) and alternatives of a nuclear stress test were discussed in detail with Mr. Fergusson and he agrees to proceed.      Medication Adjustments/Labs and Tests Ordered: Current medicines are reviewed at length with the patient today.  Concerns regarding medicines are outlined above.  Orders Placed This Encounter  Procedures   Basic metabolic panel   MYOCARDIAL PERFUSION IMAGING   EKG 12-Lead   No orders of the defined types were placed in this encounter.   Patient Instructions  Medication Instructions Your physician has recommended you make the following change in your medication:  Change Lasix to every other day   *If you need a refill on your cardiac medications before your next appointment, please call your pharmacy*   Lab Work: Your physician recommends that you return for lab work in: 10 days for [facility] Lab opens at 8am. You DO NOT NEED an appointment. Best time to come is between 8am and 12noon and between 1:30 and 4:30. If you have been asked to fast for your blood work please have nothing  to eat or drink after midnight. You may have water.   If you have labs (blood work) drawn today and your tests are completely normal, you will receive your results only by: MyChart Message (if you have MyChart) OR A paper copy in the mail If you have any lab test that is abnormal or we need to change your treatment, we will call you to review the results.   Testing/Procedures: Your physician has requested that you have a lexiscan myoview. For further information please visit [email or web address] Please follow instruction sheet, as given.   The test will take  approximately 3 to 4 hours to complete; you may bring reading material.  If someone comes with you to your appointment, they will need to remain in the main lobby due to limited space in the testing area.  How to prepare for your Myocardial Perfusion Test:             Do not take Erectile Dysfunction medication 48 hours prior to test. Do not eat or drink 3 hours prior to your test, except you may have water. Do not consume products containing caffeine (regular or decaffeinated) 12 hours prior to your test. (ex: coffee, chocolate, sodas, tea). Do bring a list of your current medications with you.  If not listed below, you may take your medications as normal. Do wear comfortable clothes (no dresses or overalls) and walking shoes, tennis shoes preferred (No heels or open toe shoes are allowed). Do NOT wear cologne, perfume, aftershave, or lotions (deodorant is allowed). If these instructions are not followed, your test will have to be rescheduled.    Follow-Up: At [facility] [facility], you and your health needs are our priority.  As part of our continuing mission to provide you with exceptional heart care, we have created designated Provider Care Teams.  These Care Teams include your primary Cardiologist (physician) and Advanced Practice Providers (APPs -  Physician Assistants and Nurse Practitioners) who all work together to provide you with the care you need, when you need it.  We recommend signing up for the patient portal called "MyChart".  Sign up information is provided on this After Visit Summary.  MyChart is used to connect with patients for Virtual Visits (Telemedicine).  Patients are able to view lab/test results, encounter notes, upcoming appointments, etc.  Non-urgent messages can be sent to your provider as well.   To learn more about what you can do with MyChart, go to [email or web address].    Your next appointment:   2 month(s)  Provider:   Wallis Bamberg, NP [facility])     Other Instructions     Signed, Flossie Dibble, NP  03/19/2023 12:42 PM     [facility]

## 2023-03-19 NOTE — Patient Instructions (Signed)
Medication Instructions Your physician has recommended you make the following change in your medication:  Change Lasix to every other day   *If you need a refill on your cardiac medications before your next appointment, please call your pharmacy*   Lab Work: Your physician recommends that you return for lab work in: 10 days for [facility] Lab opens at 8am. You DO NOT NEED an appointment. Best time to come is between 8am and 12noon and between 1:30 and 4:30. If you have been asked to fast for your blood work please have nothing to eat or drink after midnight. You may have water.   If you have labs (blood work) drawn today and your tests are completely normal, you will receive your results only by: MyChart Message (if you have MyChart) OR A paper copy in the mail If you have any lab test that is abnormal or we need to change your treatment, we will call you to review the results.   Testing/Procedures: Your physician has requested that you have a lexiscan myoview. For further information please visit [email or web address] Please follow instruction sheet, as given.   The test will take approximately 3 to 4 hours to complete; you may bring reading material.  If someone comes with you to your appointment, they will need to remain in the main lobby due to limited space in the testing area.  How to prepare for your Myocardial Perfusion Test:             Do not take Erectile Dysfunction medication 48 hours prior to test. Do not eat or drink 3 hours prior to your test, except you may have water. Do not consume products containing caffeine (regular or decaffeinated) 12 hours prior to your test. (ex: coffee, chocolate, sodas, tea). Do bring a list of your current medications with you.  If not listed below, you may take your medications as normal. Do wear comfortable clothes (no dresses or overalls) and walking shoes, tennis shoes preferred (No heels or open toe shoes are allowed). Do NOT wear cologne,  perfume, aftershave, or lotions (deodorant is allowed). If these instructions are not followed, your test will have to be rescheduled.    Follow-Up: At [facility], you and your health needs are our priority.  As part of our continuing mission to provide you with exceptional heart care, we have created designated Provider Care Teams.  These Care Teams include your primary Cardiologist (physician) and Advanced Practice Providers (APPs -  Physician Assistants and Nurse Practitioners) who all work together to provide you with the care you need, when you need it.  We recommend signing up for the patient portal called "MyChart".  Sign up information is provided on this After Visit Summary.  MyChart is used to connect with patients for Virtual Visits (Telemedicine).  Patients are able to view lab/test results, encounter notes, upcoming appointments, etc.  Non-urgent messages can be sent to your provider as well.   To learn more about what you can do with MyChart, go to [email or web address].    Your next appointment:   2 month(s)  Provider:   Wallis Bamberg, NP [facility])    Other Instructions

## 2023-03-21 ENCOUNTER — Ambulatory Visit: Payer: Medicare Other | Attending: Cardiology

## 2023-03-21 DIAGNOSIS — R072 Precordial pain: Secondary | ICD-10-CM | POA: Insufficient documentation

## 2023-03-21 LAB — MYOCARDIAL PERFUSION IMAGING
LV dias vol: 150 mL (ref 62–150)
LV sys vol: 89 mL
Nuc Stress EF: 41 %
Peak HR: 83 {beats}/min
Rest HR: 61 {beats}/min
Rest Nuclear Isotope Dose: 10.7 mCi
SDS: 3
SRS: 5
SSS: 8
ST Depression (mm): 0 mm
Stress Nuclear Isotope Dose: 32.4 mCi
TID: 1.04

## 2023-03-21 MED ORDER — REGADENOSON 0.4 MG/5ML IV SOLN
0.4000 mg | Freq: Once | INTRAVENOUS | Status: AC
Start: 2023-03-21 — End: 2023-03-21
  Administered 2023-03-21: 0.4 mg via INTRAVENOUS

## 2023-03-21 MED ORDER — TECHNETIUM TC 99M TETROFOSMIN IV KIT
10.7000 | PACK | Freq: Once | INTRAVENOUS | Status: AC | PRN
  Administered 2023-03-21: 10.7 via INTRAVENOUS

## 2023-03-21 MED ORDER — TECHNETIUM TC 99M TETROFOSMIN IV KIT
32.4000 | PACK | Freq: Once | INTRAVENOUS | Status: AC | PRN
  Administered 2023-03-21: 32.4 via INTRAVENOUS

## 2023-03-22 ENCOUNTER — Telehealth: Payer: Self-pay | Admitting: Cardiology

## 2023-03-22 ENCOUNTER — Telehealth: Payer: Self-pay

## 2023-03-22 NOTE — Telephone Encounter (Signed)
-----   Message from Flossie Dibble, NP sent at 03/22/2023 11:16 AM EDT ----- Stress test revealed no reduced blood flow to his heart, which is good. Did he start taking the lasix every other day, if so did it help?

## 2023-03-22 NOTE — Telephone Encounter (Signed)
Returned patient's call and left a message.

## 2023-03-22 NOTE — Telephone Encounter (Signed)
LM to return my call. 

## 2023-03-22 NOTE — Telephone Encounter (Signed)
Follow Up:    Patient is retuning Javanna's call from today.

## 2023-03-25 NOTE — Telephone Encounter (Signed)
Patient is returning call, please advise 

## 2023-03-25 NOTE — Telephone Encounter (Signed)
Results reviewed with pt as per Jennifer Woody PA's note.  Pt verbalized understanding and had no additional questions. Routed to PCP.  

## 2023-03-30 LAB — BASIC METABOLIC PANEL WITH GFR
BUN/Creatinine Ratio: 15 (ref 10–24)
BUN: 25 mg/dL (ref 8–27)
CO2: 25 mmol/L (ref 20–29)
Calcium: 9.6 mg/dL (ref 8.6–10.2)
Chloride: 102 mmol/L (ref 96–106)
Creatinine, Ser: 1.64 mg/dL — ABNORMAL HIGH (ref 0.76–1.27)
Glucose: 102 mg/dL — ABNORMAL HIGH (ref 70–99)
Potassium: 4.3 mmol/L (ref 3.5–5.2)
Sodium: 142 mmol/L (ref 134–144)
eGFR: 42 mL/min/1.73 — ABNORMAL LOW

## 2023-04-02 NOTE — Progress Notes (Signed)
[facility]  [address] [place],  [place]  [address] [phone]  Clinic Day:  04/03/2023  Referring physician: Galvin Proffer, MD   HISTORY OF PRESENT ILLNESS:  The patient is a 82 y.o. 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  The patient is currently on pemigatinib for his disease management, for which scans have shown no evidence of disease progression since he has been on this agent.  He comes in today for routine follow-up.  Since his last visit, the patient has been doing okay.  His only complaint today is breast enlargement and sensitivity, worrisome for gynecomastia.  Although he claims to be eating and drinking well, his gait is unsteady today.  He also claims to get lightheaded upon standing.  He also claims his urine is "amber in color."   He denies having any new symptoms or findings which concern him for disease progression.    PHYSICAL EXAM:  Blood pressure 130/70, pulse 73, temperature 98.8 F (37.1 C), resp. rate 16, height 6\' 2"  (1.88 m), weight 207 lb (93.9 kg), SpO2 97 %.  His systolic blood pressure dropped to below 100 upon standing Wt Readings from Last 3 Encounters:  04/03/23 207 lb (93.9 kg)  03/21/23 211 lb (95.7 kg)  03/19/23 211 lb 12.8 oz (96.1 kg)   Body mass index is 26.58 kg/m.  Performance status (ECOG): 1 - Symptomatic but completely ambulatory  Physical Exam Vitals and nursing note reviewed.  Constitutional:      General: He is not in acute distress.    Appearance: Normal appearance. He is normal weight.  HENT:     Head: Normocephalic and atraumatic.     Mouth/Throat:     Mouth: Mucous membranes are dry.     Pharynx: Oropharynx is clear. No oropharyngeal exudate or posterior oropharyngeal erythema.     Comments: Extremely dry oropharynx Eyes:     General: No scleral icterus.     Extraocular Movements: Extraocular movements intact.     Conjunctiva/sclera: Conjunctivae normal.     Pupils: Pupils are equal, round, and reactive to light.  Cardiovascular:     Rate and Rhythm: Normal rate and regular rhythm.     Heart sounds: Normal heart sounds. No murmur heard.    No friction rub. No gallop.  Pulmonary:     Effort: Pulmonary effort is normal.     Breath sounds: Normal breath sounds. No wheezing, rhonchi or rales.  Abdominal:     General: Bowel sounds are normal. There is no distension.     Palpations: Abdomen is soft. There is no hepatomegaly, splenomegaly or mass.     Tenderness: There is no abdominal tenderness.  Musculoskeletal:        General: Normal range of motion.     Cervical back: Normal range of motion and neck supple. No tenderness.     Right lower leg: No edema.     Left lower leg: No edema.  Lymphadenopathy:     Cervical: No cervical adenopathy.     Upper Body:     Right upper body: No supraclavicular or axillary adenopathy.     Left upper body: No supraclavicular or axillary adenopathy.     Lower Body: No right inguinal adenopathy. No left inguinal adenopathy.  Skin:    General: Skin is warm and dry.     Coloration: Skin is not jaundiced.  Findings: No rash.  Neurological:     Mental Status: He is alert and oriented to person, place, and time.     Cranial Nerves: No cranial nerve deficit.  Psychiatric:        Mood and Affect: Mood normal.        Behavior: Behavior normal.        Thought Content: Thought content normal.    LABS:  Latest Reference Range & Units 04/03/23 00:00  WBC  10.2 (E)  RBC 3.87 - 5.11  4.17 (E)  Hemoglobin 13.5 - 17.5  12.7 ! (E)  HCT 41 - 53  38 ! (E)  Platelets 150 - 400 K/uL 148 ! (E)  NEUT#  6.73 (E)  !: Data is abnormal (E): External lab result  Latest Reference Range & Units 04/03/23 00:00  Sodium 137 - 147  139 (E)  Potassium 3.5 - 5.1 mEq/L 4.0 (E)  Chloride 99 - 108  100 (E)  CO2 13 - 22  27 ! (E)   Glucose  86 (E)  BUN 4 - 21  37 ! (E)  Creatinine 0.6 - 1.3  1.7 ! (E)  Calcium 8.7 - 10.7  9.5 (E)  Alkaline Phosphatase 25 - 125  73 (E)  Albumin 3.5 - 5.0  4.2 (E)  AST 14 - 40  42 ! (E)  ALT 10 - 40 U/L 31 (E)  Bilirubin, Total  1.3 (E)  !: Data is abnormal (E): External lab result ASSESSMENT & PLAN:  Assessment/Plan:   An 82 y.o. male with known metastatic FGFR2 mutation positive cholangiocarcinoma.  When evaluating his labs today, his BUN/creatinine ratio is higher than what it has been in the past.  When factoring this in with clear signs of dehydration, I will give him 1500 cc of normal saline to make him more euvolemic.  I strongly encouraged him to increase his fluid intake, especially over these hotter months of the year.  Our office also called a family member to ensure that he never comes to clinic again by himself.  For now, he will continue to take his pemigatinib for his disease.  I will see him back in 6 weeks for repeat clinical assessment. A repeat abdominal MRI will be done a day before his next visit to ascertain his new disease baseline.  The patient understands all the plans discussed today and is in agreement with them.  Sallie Staron Kirby Funk, MD

## 2023-04-03 ENCOUNTER — Other Ambulatory Visit: Payer: Self-pay | Admitting: Oncology

## 2023-04-03 ENCOUNTER — Inpatient Hospital Stay: Payer: Medicare Other

## 2023-04-03 ENCOUNTER — Inpatient Hospital Stay: Payer: Medicare Other | Attending: Oncology | Admitting: Oncology

## 2023-04-03 VITALS — BP 123/62 | HR 63 | Temp 98.8°F | Resp 16 | Ht 74.0 in | Wt 207.0 lb

## 2023-04-03 DIAGNOSIS — T451X5A Adverse effect of antineoplastic and immunosuppressive drugs, initial encounter: Secondary | ICD-10-CM

## 2023-04-03 DIAGNOSIS — C786 Secondary malignant neoplasm of retroperitoneum and peritoneum: Secondary | ICD-10-CM | POA: Insufficient documentation

## 2023-04-03 DIAGNOSIS — D6481 Anemia due to antineoplastic chemotherapy: Secondary | ICD-10-CM | POA: Diagnosis not present

## 2023-04-03 DIAGNOSIS — C240 Malignant neoplasm of extrahepatic bile duct: Secondary | ICD-10-CM | POA: Diagnosis not present

## 2023-04-03 DIAGNOSIS — C221 Intrahepatic bile duct carcinoma: Secondary | ICD-10-CM | POA: Insufficient documentation

## 2023-04-03 LAB — HEPATIC FUNCTION PANEL
ALT: 31 U/L (ref 10–40)
AST: 42 — AB (ref 14–40)
Alkaline Phosphatase: 73 (ref 25–125)
Bilirubin, Total: 1.3

## 2023-04-03 LAB — BASIC METABOLIC PANEL
BUN: 37 — AB (ref 4–21)
CO2: 27 — AB (ref 13–22)
Chloride: 100 (ref 99–108)
Creatinine: 1.7 — AB (ref 0.6–1.3)
EGFR: 39
Glucose: 86
Potassium: 4 mEq/L (ref 3.5–5.1)
Sodium: 139 (ref 137–147)

## 2023-04-03 LAB — CBC AND DIFFERENTIAL
HCT: 38 — AB (ref 41–53)
Hemoglobin: 12.7 — AB (ref 13.5–17.5)
Neutrophils Absolute: 6.73
Platelets: 148 10*3/uL — AB (ref 150–400)
WBC: 10.2

## 2023-04-03 LAB — COMPREHENSIVE METABOLIC PANEL
Albumin: 4.2 (ref 3.5–5.0)
Calcium: 9.5 (ref 8.7–10.7)

## 2023-04-03 LAB — CBC: RBC: 4.17 (ref 3.87–5.11)

## 2023-04-03 MED ORDER — SODIUM CHLORIDE 0.9% FLUSH
10.0000 mL | Freq: Once | INTRAVENOUS | Status: AC | PRN
  Administered 2023-04-03: 10 mL

## 2023-04-03 MED ORDER — ALTEPLASE 2 MG IJ SOLR: 2.0000 mg | Freq: Once | INTRAMUSCULAR | Status: DC | PRN

## 2023-04-03 MED ORDER — SODIUM CHLORIDE 0.9 % IV SOLN: Freq: Once | INTRAVENOUS | Status: AC

## 2023-04-03 MED ORDER — SODIUM CHLORIDE 0.9% FLUSH
10.0000 mL | INTRAVENOUS | Status: DC | PRN
  Administered 2023-04-03: 10 mL

## 2023-04-03 NOTE — Progress Notes (Signed)
Patient remains unsteady on feet, orthostatic after of NS.  Dr. Melvyn Neth recommended giving the last , patient in agreement.  Patient has been to restroom for urination 5 times in the past 4 hours. He did state the he "took his fluid pill this morning."  Urine is a pale yellow and appears clear to the naked eye.  Patient finished second liter and was not as orthostatic as previously, however remains unsteady on feet, he it using his cane.  I offered to call someone to drive him home, but he refused.  I took him to his vehicle via wheelchair.

## 2023-05-10 ENCOUNTER — Telehealth: Payer: Self-pay | Admitting: Cardiology

## 2023-05-10 NOTE — Telephone Encounter (Signed)
Patient states he is returning a call but does not know what it was in regards to.

## 2023-05-13 ENCOUNTER — Inpatient Hospital Stay: Payer: Medicare Other | Attending: Oncology

## 2023-05-13 DIAGNOSIS — C786 Secondary malignant neoplasm of retroperitoneum and peritoneum: Secondary | ICD-10-CM | POA: Insufficient documentation

## 2023-05-13 DIAGNOSIS — C240 Malignant neoplasm of extrahepatic bile duct: Secondary | ICD-10-CM

## 2023-05-13 DIAGNOSIS — C221 Intrahepatic bile duct carcinoma: Secondary | ICD-10-CM | POA: Diagnosis present

## 2023-05-13 DIAGNOSIS — N62 Hypertrophy of breast: Secondary | ICD-10-CM | POA: Insufficient documentation

## 2023-05-13 LAB — CBC WITH DIFFERENTIAL (CANCER CENTER ONLY)
Abs Immature Granulocytes: 0.02 10*3/uL (ref 0.00–0.07)
Basophils Absolute: 0.1 10*3/uL (ref 0.0–0.1)
Basophils Relative: 1 %
Eosinophils Absolute: 0.2 10*3/uL (ref 0.0–0.5)
Eosinophils Relative: 2 %
HCT: 36.9 % — ABNORMAL LOW (ref 39.0–52.0)
Hemoglobin: 11.5 g/dL — ABNORMAL LOW (ref 13.0–17.0)
Immature Granulocytes: 0 %
Lymphocytes Relative: 22 %
Lymphs Abs: 1.6 10*3/uL (ref 0.7–4.0)
MCH: 29.9 pg (ref 26.0–34.0)
MCHC: 31.2 g/dL (ref 30.0–36.0)
MCV: 96.1 fL (ref 80.0–100.0)
Monocytes Absolute: 0.7 10*3/uL (ref 0.1–1.0)
Monocytes Relative: 9 %
Neutro Abs: 4.9 10*3/uL (ref 1.7–7.7)
Neutrophils Relative %: 66 %
Platelet Count: 136 10*3/uL — ABNORMAL LOW (ref 150–400)
RBC: 3.84 MIL/uL — ABNORMAL LOW (ref 4.22–5.81)
RDW: 13.3 % (ref 11.5–15.5)
WBC Count: 7.4 10*3/uL (ref 4.0–10.5)
nRBC: 0 % (ref 0.0–0.2)

## 2023-05-13 LAB — CMP (CANCER CENTER ONLY)
ALT: 28 U/L (ref 0–44)
AST: 33 U/L (ref 15–41)
Albumin: 3.6 g/dL (ref 3.5–5.0)
Alkaline Phosphatase: 68 U/L (ref 38–126)
Anion gap: 12 (ref 5–15)
BUN: 24 mg/dL — ABNORMAL HIGH (ref 8–23)
CO2: 30 mmol/L (ref 22–32)
Calcium: 9.7 mg/dL (ref 8.9–10.3)
Chloride: 101 mmol/L (ref 98–111)
Creatinine: 1.69 mg/dL — ABNORMAL HIGH (ref 0.61–1.24)
GFR, Estimated: 41 mL/min — ABNORMAL LOW (ref >60)
Glucose, Bld: 99 mg/dL (ref 70–99)
Potassium: 4.1 mmol/L (ref 3.5–5.1)
Sodium: 143 mmol/L (ref 135–145)
Total Bilirubin: 0.5 mg/dL (ref 0.3–1.2)
Total Protein: 6.9 g/dL (ref 6.5–8.1)

## 2023-05-14 NOTE — Progress Notes (Unsigned)
[facility]  [address] [place],  [place]  [address] [phone]  Clinic Day:  05/15/2023  Referring physician: Galvin Proffer, MD   HISTORY OF PRESENT ILLNESS:  The patient is a 82 y.o. 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  Since November 2023, he has been on pemigatinib for his disease management.  He comes in today to go over his abdominal MRI to ascertain his new disease baseline.  Since his last visit, the patient has been doing okay.  His only complaint today remains gynecomastia ,which is more prominent with his left breast. However, he believes it is better.   He denies having any new symptoms or findings which concern him for disease progression.    PHYSICAL EXAM:  Blood pressure 129/84, pulse 85, temperature 98.3 F (36.8 C), resp. rate 16, height 6\' 2"  (1.88 m), weight 214 lb 14.4 oz (97.5 kg), SpO2 94%.  His systolic blood pressure dropped to below 100 upon standing Wt Readings from Last 3 Encounters:  05/15/23 214 lb 14.4 oz (97.5 kg)  04/03/23 207 lb (93.9 kg)  03/21/23 211 lb (95.7 kg)   Body mass index is 27.59 kg/m.  Performance status (ECOG): 1 - Symptomatic but completely ambulatory  Physical Exam Vitals and nursing note reviewed.  Constitutional:      General: He is not in acute distress.    Appearance: Normal appearance. He is normal weight.  HENT:     Head: Normocephalic and atraumatic.     Mouth/Throat:     Mouth: Mucous membranes are moist.     Pharynx: Oropharynx is clear. No oropharyngeal exudate or posterior oropharyngeal erythema.  Eyes:     General: No scleral icterus.    Extraocular Movements: Extraocular movements intact.     Conjunctiva/sclera: Conjunctivae normal.     Pupils: Pupils are equal, round, and reactive to light.  Cardiovascular:     Rate and Rhythm: Normal rate and  regular rhythm.     Heart sounds: Normal heart sounds. No murmur heard.    No friction rub. No gallop.  Pulmonary:     Effort: Pulmonary effort is normal.     Breath sounds: Normal breath sounds. No wheezing, rhonchi or rales.  Abdominal:     General: Bowel sounds are normal. There is no distension.     Palpations: Abdomen is soft. There is no hepatomegaly, splenomegaly or mass.     Tenderness: There is no abdominal tenderness.  Musculoskeletal:        General: Normal range of motion.     Cervical back: Normal range of motion and neck supple. No tenderness.     Right lower leg: No edema.     Left lower leg: No edema.  Lymphadenopathy:     Cervical: No cervical adenopathy.     Upper Body:     Right upper body: No supraclavicular or axillary adenopathy.     Left upper body: No supraclavicular or axillary adenopathy.     Lower Body: No right inguinal adenopathy. No left inguinal adenopathy.  Skin:    General: Skin is warm and dry.     Coloration: Skin is not jaundiced.     Findings: No rash.  Neurological:     Mental Status: He is alert and oriented to person, place, and time.     Cranial Nerves: No cranial nerve deficit.  Psychiatric:  Mood and Affect: Mood normal.        Behavior: Behavior normal.        Thought Content: Thought content normal.   SCANS:  Her abdominal MRI done on 05-13-23 revealed the following: FINDINGS: Lower chest: Unremarkable.  Hepatobiliary: Status post left hepatectomy and cholecystectomy. In segment 5 of the liver (axial image 55 of series 1305) there is an amorphous area of increased enhancement which is best appreciated on delayed images. This area appears slightly T1 hypointense, minimally T2 hyperintense with no enhancement on arterial phase imaging. The enhancement in this region increases progressively throughout the examination. No other hepatic lesions are identified. No intra or extrahepatic biliary ductal dilatation. Common bile duct  measures 6 mm in the porta hepatis. No filling defects in the common bile duct to suggest choledocholithiasis.  Pancreas: No pancreatic mass. No pancreatic ductal dilatation. No pancreatic or peripancreatic fluid collections or inflammatory changes.  Spleen: Small splenule adjacent to the splenic hilum. Otherwise, unremarkable.  Adrenals/Urinary Tract: Multiple small T1 hypointense, T2 hyperintense, nonenhancing lesions in the kidneys compatible with simple (Bosniak class 1) cysts which require no imaging follow up, measuring up to 1.3 cm in the lower pole of the left kidney. Other tiny subcentimeter T1 hyperintense, T2 hypointense to mildly T2 hyperintense lesions are noted which demonstrate no enhancement, compatible with proteinaceous/hemorrhagic cysts (Bosniak class 2, no imaging follow-up recommended), largest of which is exophytic extending off the posterior aspect of the lower pole of the left kidney (axial image 96 of series 1300). No aggressive appearing renal lesions. No hydroureteronephrosis in the visualized portions of the abdomen. Bilateral adrenal glands are normal in appearance.  Stomach/Bowel: Visualized portions are unremarkable.  Vascular/Lymphatic: No aneurysm identified in the visualized abdominal vasculature. No definite lymphadenopathy noted in the abdomen.  Other: Peritoneal metastases are again noted in the left pericolic region, largest of which measures 3.7 x 2.7 cm (coronal image 14 of series 7), which appears similar to the prior examination. Large aggregate of peritoneal disease anteriorly (coronal image 25 of series 7) measuring 5.8 x 6.4 cm (previously 6.6 x 6.3 cm). No significant volume of ascites in the visualized portions of the peritoneal cavity.  Musculoskeletal: Incidental imaging of the left gluteal region also demonstrates what appears to be a soft tissue implant in the subcutaneous fat of the lateral left gluteal region,  incompletely imaged (coronal image 15 of series 7), grossly similar in retrospect to the prior examination. No aggressive appearing osseous lesions are noted in the visualized portions of the skeleton.  IMPRESSION: 1. Subtle enhancing lesion in segment 5 of the liver concerning for recurrent cholangiocarcinoma, as above. 2. Metastatic lesions in the peritoneal cavity redemonstrated, as above. The largest of these may be slightly smaller than the prior examination. There also appears to be a metastatic lesion in the subcutaneous fat of the left gluteal region, as discussed above. 3. Additional incidental findings, as above.  LABS:    Latest Reference Range & Units 05/13/23 11:29  Sodium 135 - 145 mmol/L 143  Potassium 3.5 - 5.1 mmol/L 4.1  Chloride 98 - 111 mmol/L 101  CO2 22 - 32 mmol/L 30  Glucose 70 - 99 mg/dL 99  BUN 8 - 23 mg/dL 24 (H)  Creatinine 1.61 - 1.24 mg/dL 0.96 (H)  Calcium 8.9 - 10.3 mg/dL 9.7  Anion gap 5 - 15  12  Alkaline Phosphatase 38 - 126 U/L 68  Albumin 3.5 - 5.0 g/dL 3.6  AST 15 - 41 U/L 33  ALT 0 - 44 U/L 28  Total Protein 6.5 - 8.1 g/dL 6.9  Total Bilirubin 0.3 - 1.2 mg/dL 0.5  GFR, Est Non African American >60 mL/min 41 (L)  (H): Data is abnormally high (L): Data is abnormally low  ASSESSMENT & PLAN:  Assessment/Plan:   An 82 y.o. male with known metastatic FGFR2 mutation positive cholangiocarcinoma.  In clinic today, I went over his MRI images with him, for which he could see that he essentially has no signs of disease progression.  The changes with segment 5 of his liver really do not look much different versus previously.  Furthermore, there appear to be no signs of progression with his other known metastatic lesions.  Overall, it appears his disease remains under ideal control.  Based upon this, he will continue taking pemigatinib for his disease management.  I did tell the patient that there are no case reports of pemigatinib causing gynecomastia.   If his gynecomastia progresses over time, I would consider referring him to an endocrinologist for further evaluation.  Otherwise, as he is doing well, I will see him back in 2 months for repeat clinical assessment.  The patient understands all the plans discussed today and is in agreement with them.  Edwyn Inclan Kirby Funk, MD

## 2023-05-15 ENCOUNTER — Inpatient Hospital Stay (INDEPENDENT_AMBULATORY_CARE_PROVIDER_SITE_OTHER): Payer: Medicare Other | Admitting: Oncology

## 2023-05-15 ENCOUNTER — Other Ambulatory Visit: Payer: Self-pay | Admitting: Oncology

## 2023-05-15 VITALS — BP 129/84 | HR 85 | Temp 98.3°F | Resp 16 | Ht 74.0 in | Wt 214.9 lb

## 2023-05-15 DIAGNOSIS — D6481 Anemia due to antineoplastic chemotherapy: Secondary | ICD-10-CM

## 2023-05-15 DIAGNOSIS — C240 Malignant neoplasm of extrahepatic bile duct: Secondary | ICD-10-CM

## 2023-05-15 DIAGNOSIS — C221 Intrahepatic bile duct carcinoma: Secondary | ICD-10-CM

## 2023-05-16 ENCOUNTER — Telehealth: Payer: Self-pay | Admitting: Oncology

## 2023-05-16 NOTE — Telephone Encounter (Signed)
Contacted pt to schedule an appt. Unable to reach via phone, voicemail was left.   Scheduling Message Entered by Rennis Harding A on 05/15/2023 at  1:29 PM Priority: Routine <No visit type provided>  Department: [facility] [place] [facility] [place] [facility]  Provider:  Scheduling Notes:  Labs/appt 07-15-23

## 2023-05-17 ENCOUNTER — Encounter: Payer: Self-pay | Admitting: Cardiology

## 2023-05-17 ENCOUNTER — Ambulatory Visit: Payer: Medicare Other | Attending: Cardiology | Admitting: Cardiology

## 2023-05-17 VITALS — BP 110/60 | HR 70 | Ht 74.0 in | Wt 212.0 lb

## 2023-05-17 DIAGNOSIS — C240 Malignant neoplasm of extrahepatic bile duct: Secondary | ICD-10-CM | POA: Diagnosis present

## 2023-05-17 DIAGNOSIS — I2510 Atherosclerotic heart disease of native coronary artery without angina pectoris: Secondary | ICD-10-CM | POA: Insufficient documentation

## 2023-05-17 DIAGNOSIS — I10 Essential (primary) hypertension: Secondary | ICD-10-CM | POA: Diagnosis present

## 2023-05-17 DIAGNOSIS — R0609 Other forms of dyspnea: Secondary | ICD-10-CM | POA: Insufficient documentation

## 2023-05-17 NOTE — Progress Notes (Unsigned)
Cardiology Office Note:    Date:  05/17/2023   ID:  [name], [name] September 09, 1941, MRN [id number]  PCP:  Galvin Proffer, MD  Cardiologist:  Gypsy Balsam, MD    Referring MD: Galvin Proffer, MD   Chief Complaint  Patient presents with   chest soreness     When touching    History of Present Illness:    Gary [name] is a 82 y.o. male past medical history significant for coronary artery disease in July 11, 2020 he had cardiac catheterization for history of acute non-STEMI.  He was found to have multiple lesions however [place] region appears to be midportion of the circumflex artery that was addressed with drug-eluting stent he was put on dual antiplatelets therapy and discharged home he was also find to have cardiomyopathy with ejection fraction 40 to 45% in addition problem include liver cancer, essential hypertension, dyslipidemia, diabetes. Comes today to months for follow-up cardiac wise seems to be doing well.  He denies of any chest pain tightness squeezing pressure burning chest he does have however tenderness of the chest on palpating his breast.  There is also bilateral gynecomastia.  Looks slightly worse on the left side.  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   Chronic obstructive pulmonary disease (HCC) 12/13/2020   Chronic pain syndrome 12/13/2020   Chronic systolic heart failure (HCC) 12/13/2020   Coronary artery disease with PTCA and stenting of mid and distal circumflex artery on 07/11/2020 07/29/2020   Diastolic congestive heart failure (HCC) 04/24/2019   Dizziness 08/30/2020   Dyspnea on exertion 04/24/2019    Encounter for therapeutic drug level monitoring 06/15/2020   Essential hypertension 04/24/2019   Gastro-esophageal reflux disease without esophagitis 12/13/2020   Generalized anxiety disorder 12/13/2020   Hyperglycemia due to type 2 diabetes mellitus (HCC) 12/13/2020   Hypertension    Hypothyroidism 12/13/2020   Liver tumor 12/27/2017   Formatting of this note might be different from the original. 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/13/2020   Mixed hyperlipidemia 12/13/2020   NSTEMI (non-ST elevated myocardial infarction) (HCC) 07/09/2020   Orthostatic hypotension 10/20/2020   Other long term (current) drug therapy 12/13/2020   Other vitamin B12 deficiency anemias 12/13/2020   Pain in left hip 12/13/2020   Preop cardiovascular exam 06/15/2020   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/30/2020   Type 2 diabetes mellitus without complications (HCC) 12/13/2020   Unsteadiness on feet 08/30/2020   UTI (urinary tract infection) 01/08/2018   Vitamin D deficiency 12/13/2020    Past Surgical History:  Procedure Laterality Date   BILE DUCT EXPLORATION     To remove cancer   CORONARY STENT INTERVENTION N/A 07/11/2020   Procedure: CORONARY STENT INTERVENTION;  Surgeon: Marykay Lex, MD;  Location[facility] INVASIVE CV LAB;  Service: Cardiovascular;  Laterality: N/A;   LEFT HEART CATH AND CORONARY ANGIOGRAPHY N/A 07/11/2020   Procedure: LEFT HEART CATH AND CORONARY ANGIOGRAPHY;  Surgeon: Marykay Lex, MD;  Location: [facility] INVASIVE CV LAB;  Service: Cardiovascular;  Laterality: N/A;   MOHS SURGERY     Basil cell on nose    Current Medications: Current Meds  Medication Sig   ALPRAZolam (XANAX) 0.5 MG tablet Take 0.5 mg by mouth 3 (three) times daily as needed for anxiety.    aspirin  EC 81 MG tablet Take 1 tablet (81 mg total) by mouth daily. Swallow whole.   atorvastatin (LIPITOR) 10 MG tablet Take 10 mg by mouth daily.   buPROPion (WELLBUTRIN XL) 300 MG 24 hr tablet Take 1 tablet by mouth daily. For 90 days   calcium carbonate (TUMS - DOSED IN MG ELEMENTAL CALCIUM) 500 MG chewable tablet Chew 1 tablet by mouth in the morning and at bedtime. Total of 1200 mg daily   clopidogrel (PLAVIX) 75 MG tablet Take 75 mg by mouth daily.   ferrous sulfate 325 (65 FE) MG tablet Take 325 mg by mouth daily with breakfast.   folic acid (FOLVITE) 1 MG tablet Take 1 tablet (1 mg total) by mouth daily.   furosemide (LASIX) 20 MG tablet Take 1 tablet (20 mg total) by mouth daily.   HYDROcodone-acetaminophen (NORCO) 10-325 MG tablet Take 1 tablet by mouth every 6 (six) hours as needed for moderate pain or severe pain.   ketoconazole (NIZORAL) 2 % cream Apply 1 Application topically daily.   magnesium citrate SOLN Take 1 Bottle by mouth once. Pt to take 1/2 bottle. If no results in 1hr, take the other 1/2.   montelukast (SINGULAIR) 10 MG tablet Take 1 tablet by mouth daily.   Multiple Vitamin (MULTIVITAMIN) tablet Take 1 tablet by mouth daily. Unknown Strength per patient   nitroGLYCERIN (NITROSTAT) 0.4 MG SL tablet Place 1 tablet (0.4 mg total) under the tongue every 5 (five) minutes as needed for chest pain.   Omega-3 Fatty Acids (FISH OIL) 1200 MG CPDR Take 1 tablet by mouth daily.   omeprazole (PRILOSEC) 40 MG capsule Take 1 capsule by mouth daily as needed (Acid reflux).   ondansetron (ZOFRAN) 4 MG tablet TAKE 1 TABLET BY MOUTH EVERY 4 HOURS AS NEEDED FOR NAUSEA OR VOMITING (Patient taking differently: Take 4 mg by mouth every 8 (eight) hours as needed for nausea or vomiting.)   pemigatinib (PEMAZYRE) 9 MG tablet Take 1 tablet (9 mg total) by mouth daily. Take at approximately the same time each day. Take for 14 days on, 7 days off, repeat every 21d.   Plecanatide (TRULANCE) 3 MG TABS Take 3  mg by mouth 2 (two) times a week.   senna (SENOKOT) 8.6 MG TABS tablet Take 1 tablet by mouth in the morning and at bedtime. Can increase to 2 tabs twice daily if needed.   tamsulosin (FLOMAX) 0.4 MG CAPS capsule Take 0.4 mg by mouth daily.    vitamin B-12 (CYANOCOBALAMIN) 250 MCG tablet Take 250 mcg by mouth daily.   Vitamin D, Ergocalciferol, 50 MCG (2000 UT) CAPS Take 1 tablet by mouth daily.   [DISCONTINUED] atorvastatin (LIPITOR) 10 MG tablet Take 10 mg by mouth daily.     Allergies:   Heparin, Pork-derived products, and Amoxicillin   Social History   Socioeconomic History   Marital status: Widowed    Spouse name: Not on file   Number of children: Not on file   Years of education: Not on file   Highest education level: Not on file  Occupational History   Not on file  Tobacco  Use   Smoking status: Never   Smokeless tobacco: Current    Types: Chew  Substance and Sexual Activity   Alcohol use: Not Currently   Drug use: Never   Sexual activity: Not on file  Other Topics Concern   Not on file  Social History Narrative   Not on file   Social Determinants of Health   Financial Resource Strain: Not on file  Food Insecurity: Not on file  Transportation Needs: Not on file  Physical Activity: Not on file  Stress: Not on file  Social Connections: Not on file     Family History: The patient's family history includes Breast cancer in his sister and sister; Cancer in his father. ROS:   Please see the history of present illness.    All 14 point review of systems negative except as described per history of present illness  EKGs/Labs/Other Studies Reviewed:         Recent Labs: 05/13/2023: ALT 28; BUN 24; Creatinine 1.69; Hemoglobin 11.5; Platelet Count 136; Potassium 4.1; Sodium 143  Recent Lipid Panel    Component Value Date/Time   CHOL 139 07/09/2020 0354   TRIG 255 (H) 07/09/2020 0354   HDL 33 (L) 07/09/2020 0354   CHOLHDL 4.2 07/09/2020 0354   VLDL 51 (H)  07/09/2020 0354   LDLCALC 55 07/09/2020 0354    Physical Exam:    VS:  BP 110/60 (BP Location: Left Arm, Patient Position: Sitting)   Pulse 70   Ht 6\' 2"  (1.88 m)   Wt 212 lb (96.2 kg)   SpO2 97%   BMI 27.22 kg/m     Wt Readings from Last 3 Encounters:  05/17/23 212 lb (96.2 kg)  05/15/23 214 lb 14.4 oz (97.5 kg)  04/03/23 207 lb (93.9 kg)     GEN:  Well nourished, well developed in no acute distress HEENT: Normal NECK: No JVD; No carotid bruits LYMPHATICS: No lymphadenopathy CARDIAC: RRR, no murmurs, no rubs, no gallops RESPIRATORY:  Clear to auscultation without rales, wheezing or rhonchi  ABDOMEN: Soft, non-tender, non-distended MUSCULOSKELETAL:  No edema; No deformity  SKIN: Warm and dry LOWER EXTREMITIES: no swelling NEUROLOGIC:  Alert and oriented x 3 PSYCHIATRIC:  Normal affect   ASSESSMENT:    1. Coronary artery disease involving native coronary artery of native heart without angina pectoris   2. Essential hypertension   3. 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 Dyslipidemia I did review K PN which show me LDL 55 HDL 33 continue present management. Malignant liver cancer follow-up by oncology team.   Medication Adjustments/Labs and Tests Ordered: Current medicines are reviewed at length with the patient today.  Concerns regarding medicines are outlined above.  No orders of the defined types were placed in this encounter.  Medication changes: No orders of the defined types were placed in this encounter.   Signed, Georgeanna Lea, MD, [facility] 05/17/2023 10:40 AM    [facility]

## 2023-05-17 NOTE — Patient Instructions (Signed)
Medication Instructions:  Your physician recommends that you continue on your current medications as directed. Please refer to the Current Medication list given to you today.  *If you need a refill on your cardiac medications before your next appointment, please call your pharmacy*   Lab Work: None Ordered If you have labs (blood work) drawn today and your tests are completely normal, you will receive your results only by: MyChart Message (if you have MyChart) OR A paper copy in the mail If you have any lab test that is abnormal or we need to change your treatment, we will call you to review the results.   Testing/Procedures: Your physician has requested that you have an echocardiogram. Echocardiography is a painless test that uses sound waves to create images of your heart. It provides your doctor with information about the size and shape of your heart and how well your heart's chambers and valves are working. This procedure takes approximately one hour. There are no restrictions for this procedure. Please do NOT wear cologne, perfume, aftershave, or lotions (deodorant is allowed). Please arrive 15 minutes prior to your appointment time.      Follow-Up: At [facility], you and your health needs are our priority.  As part of our continuing mission to provide you with exceptional heart care, we have created designated Provider Care Teams.  These Care Teams include your primary Cardiologist (physician) and Advanced Practice Providers (APPs -  Physician Assistants and Nurse Practitioners) who all work together to provide you with the care you need, when you need it.  We recommend signing up for the patient portal called "MyChart".  Sign up information is provided on this After Visit Summary.  MyChart is used to connect with patients for Virtual Visits (Telemedicine).  Patients are able to view lab/test results, encounter notes, upcoming appointments, etc.  Non-urgent messages can be sent to  your provider as well.   To learn more about what you can do with MyChart, go to [email or web address].    Your next appointment:   6 month(s)  The format for your next appointment:   In Person  Provider:   Robert Krasowski, MD    Other Instructions NA  

## 2023-05-23 ENCOUNTER — Other Ambulatory Visit: Payer: Self-pay

## 2023-05-23 DIAGNOSIS — C221 Intrahepatic bile duct carcinoma: Secondary | ICD-10-CM

## 2023-05-23 NOTE — Telephone Encounter (Signed)
Patient has been scheduled. Aware of appt date and time  

## 2023-05-24 ENCOUNTER — Inpatient Hospital Stay: Payer: Medicare Other

## 2023-05-24 NOTE — Progress Notes (Signed)
[facility] CSW Progress Note  Clinical Child psychotherapist contacted patient by phone to follow up on referral.Patient reported persistent depression that fluctuates depending on the day. CSW completed PSQ-9 depression screening, patient scored a 5. No SI/HI, just persistent sadness. Patient's depression started after the death of his wife (approx 10 years ago), and has continued. CSW provided emotional support and allowed patient to discuss the details of his grief and express related feelings. Follow/up planned.    Marguerita Merles, LCSWA Clinical Social Worker [facility]

## 2023-05-29 ENCOUNTER — Telehealth: Payer: Self-pay

## 2023-05-29 NOTE — Telephone Encounter (Signed)
[facility] CSW Progress Note  Clinical Social Worker  attempted to contact patient  to follow up on emotional needs discussed per last phone call. No answer, left vm with direct contact information.     Marguerita Merles, LCSWA Clinical Social Worker [facility]

## 2023-05-30 ENCOUNTER — Ambulatory Visit: Payer: Medicare Other | Admitting: Cardiology

## 2023-06-03 ENCOUNTER — Other Ambulatory Visit: Payer: Self-pay | Admitting: Cardiology

## 2023-06-03 ENCOUNTER — Encounter: Payer: Self-pay | Admitting: Oncology

## 2023-06-04 MED ORDER — NITROGLYCERIN 0.4 MG SL SUBL: 0.4000 mg | SUBLINGUAL_TABLET | SUBLINGUAL | 10 refills | Status: AC | PRN

## 2023-06-04 MED ORDER — NITROGLYCERIN 0.4 MG SL SUBL: 0.4000 mg | SUBLINGUAL_TABLET | SUBLINGUAL | 10 refills | Status: DC | PRN

## 2023-06-04 NOTE — Addendum Note (Signed)
Addended by: Harman Langhans, Elmarie Shiley L on: 06/04/2023 07:13 AM   Modules accepted: Orders

## 2023-07-14 NOTE — Progress Notes (Signed)
[facility]  [address] [place],  [place]  [address] [phone]  Clinic Day:  07/15/2023  Referring physician: Galvin Proffer, MD   HISTORY OF PRESENT ILLNESS:  The patient is a 82 y.o. 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  Since November 2023, he has been on pemigatinib for his disease management.  He comes in today for routine follow-up.  Since his last visit, the patient has been doing okay.  However, he admits his fluid and nutritional intake have not been as good as it has been in the past.  With respect to his cholangiocarcinoma, he denies having any new symptoms or findings which concern him for disease progression.   PHYSICAL EXAM:  Blood pressure 137/62, pulse 95, temperature 98.7 F (37.1 C), resp. rate 16, height 6\' 2"  (1.88 m), weight 206 lb (93.4 kg), SpO2 93%.  His systolic blood pressure dropped to below 100 upon standing Wt Readings from Last 3 Encounters:  07/15/23 206 lb (93.4 kg)  05/17/23 212 lb (96.2 kg)  05/15/23 214 lb 14.4 oz (97.5 kg)   Body mass index is 26.45 kg/m.  Performance status (ECOG): 1 - Symptomatic but completely ambulatory  Physical Exam Vitals and nursing note reviewed.  Constitutional:      General: He is not in acute distress.    Appearance: Normal appearance. He is normal weight.  HENT:     Head: Normocephalic and atraumatic.     Mouth/Throat:     Mouth: Mucous membranes are moist.     Pharynx: Oropharynx is clear. No oropharyngeal exudate or posterior oropharyngeal erythema.  Eyes:     General: No scleral icterus.    Extraocular Movements: Extraocular movements intact.     Conjunctiva/sclera: Conjunctivae normal.     Pupils: Pupils are equal, round, and reactive to light.  Cardiovascular:     Rate and Rhythm: Normal rate and regular rhythm.     Heart sounds: Normal  heart sounds. No murmur heard.    No friction rub. No gallop.  Pulmonary:     Effort: Pulmonary effort is normal.     Breath sounds: Normal breath sounds. No wheezing, rhonchi or rales.  Abdominal:     General: Bowel sounds are normal. There is no distension.     Palpations: Abdomen is soft. There is no hepatomegaly, splenomegaly or mass.     Tenderness: There is no abdominal tenderness.  Musculoskeletal:        General: Normal range of motion.     Cervical back: Normal range of motion and neck supple. No tenderness.     Right lower leg: No edema.     Left lower leg: No edema.  Lymphadenopathy:     Cervical: No cervical adenopathy.     Upper Body:     Right upper body: No supraclavicular or axillary adenopathy.     Left upper body: No supraclavicular or axillary adenopathy.     Lower Body: No right inguinal adenopathy. No left inguinal adenopathy.  Skin:    General: Skin is warm and dry.     Coloration: Skin is not jaundiced.     Findings: No rash.  Neurological:     Mental Status: He is alert and oriented to person, place, and time.     Cranial Nerves: No cranial nerve deficit.  Psychiatric:        Mood and Affect: Mood normal.  Behavior: Behavior normal.        Thought Content: Thought content normal.    LABS:  Latest Reference Range & Units 07/15/23 00:00 07/15/23 13:31  Sodium 135 - 145 mmol/L  139  Potassium 3.5 - 5.1 mmol/L  4.4  Chloride 98 - 111 mmol/L  100  CO2 22 - 32 mmol/L  28  Glucose 70 - 99 mg/dL  94  BUN 8 - 23 mg/dL  35 (H)  Creatinine 9.32 - 1.24 mg/dL  3.55 (H)  Calcium 8.9 - 10.3 mg/dL  9.8  Anion gap 5 - 15   11  Alkaline Phosphatase 38 - 126 U/L  64  Albumin 3.5 - 5.0 g/dL  4.1  AST 15 - 41 U/L  23  ALT 0 - 44 U/L  17  Total Protein 6.5 - 8.1 g/dL  7.3  Total Bilirubin 0.3 - 1.2 mg/dL  0.7  GFR, Est Non African American >60 mL/min  28 (L)  CBC W DIFFERENTIAL ([place] SCANNED REPORT)   Rpt (IP)  WBC  10.2 (E)   RBC 3.87 - 5.11  4.12  (E)   Hemoglobin 13.5 - 17.5  12.6 ! (E)   HCT 41 - 53  38 ! (E)   Platelets 150 - 400 K/uL 150 (E)   NEUT#  7.65 (E)   (H): Data is abnormally high (L): Data is abnormally low !: Data is abnormal (IP): In Process Rpt: View report in Results Review for more information (E): External lab result  ASSESSMENT & PLAN:  Assessment/Plan:   An 82 y.o. male with known metastatic FGFR2 mutation positive cholangiocarcinoma.  His labs and physical today were clearly consistent with him being dehydrated.  Based upon this, he will receive IV fluids today.  I stressed to the patient and his family the importance of him maintaining his fluid and caloric intake.  If not, his baseline health could take a turn for the worse.  Otherwise, I will see him back in 2 months for repeat clinical assessment.  A repeat abdominal MRI will be done before his next visit to ascertain his new disease baseline while on his pemigatinib therapy.  The patient understands all the plans discussed today and is in agreement with them.  Alexiah Koroma Kirby Funk, MD

## 2023-07-15 ENCOUNTER — Other Ambulatory Visit: Payer: Self-pay | Admitting: Oncology

## 2023-07-15 ENCOUNTER — Inpatient Hospital Stay: Payer: Medicare Other | Attending: Oncology | Admitting: Oncology

## 2023-07-15 ENCOUNTER — Inpatient Hospital Stay: Payer: Medicare Other

## 2023-07-15 VITALS — BP 137/62 | HR 95 | Temp 98.7°F | Resp 16 | Ht 74.0 in | Wt 206.0 lb

## 2023-07-15 DIAGNOSIS — C221 Intrahepatic bile duct carcinoma: Secondary | ICD-10-CM

## 2023-07-15 DIAGNOSIS — C240 Malignant neoplasm of extrahepatic bile duct: Secondary | ICD-10-CM | POA: Diagnosis not present

## 2023-07-15 LAB — CMP (CANCER CENTER ONLY)
ALT: 17 U/L (ref 0–44)
AST: 23 U/L (ref 15–41)
Albumin: 4.1 g/dL (ref 3.5–5.0)
Alkaline Phosphatase: 64 U/L (ref 38–126)
Anion gap: 11 (ref 5–15)
BUN: 35 mg/dL — ABNORMAL HIGH (ref 8–23)
CO2: 28 mmol/L (ref 22–32)
Calcium: 9.8 mg/dL (ref 8.9–10.3)
Chloride: 100 mmol/L (ref 98–111)
Creatinine: 2.29 mg/dL — ABNORMAL HIGH (ref 0.61–1.24)
GFR, Estimated: 28 mL/min — ABNORMAL LOW (ref >60)
Glucose, Bld: 94 mg/dL (ref 70–99)
Potassium: 4.4 mmol/L (ref 3.5–5.1)
Sodium: 139 mmol/L (ref 135–145)
Total Bilirubin: 0.7 mg/dL (ref 0.3–1.2)
Total Protein: 7.3 g/dL (ref 6.5–8.1)

## 2023-07-15 LAB — CBC AND DIFFERENTIAL
HCT: 38 — AB (ref 41–53)
Hemoglobin: 12.6 — AB (ref 13.5–17.5)
Neutrophils Absolute: 7.65
Platelets: 150 10*3/uL (ref 150–400)
WBC: 10.2

## 2023-07-15 LAB — CBC: RBC: 4.12 (ref 3.87–5.11)

## 2023-08-01 ENCOUNTER — Telehealth: Payer: Self-pay

## 2023-08-01 NOTE — Telephone Encounter (Addendum)
I spoke with Mr Dwan to verify the number of pills he has left, and what he has taken. He asked, "Did [employer] call you? I got confused about taking the week off for some reason". Pt has taken 4 tabs of 14 (shipped on 10/30). He has 10 tabs left- he will take 1 tab daily from 08/01/2023 thru 08/10/2023. No pills week of Nov 17- Nov 23. Restart next pack on Nov 24th. Pt wrote down and verbalized correctly to me --all of the directions/dates above.

## 2023-08-01 NOTE — Telephone Encounter (Signed)
Gary [name] voiced concern that Gary [name] seemed confused about proper drug regimen. Pt was unable to tell [facility] when he started pills, or how many he had taken. I will reach out to pt.

## 2023-08-12 ENCOUNTER — Other Ambulatory Visit (HOSPITAL_COMMUNITY): Payer: Self-pay

## 2023-08-30 ENCOUNTER — Telehealth: Payer: Self-pay | Admitting: Oncology

## 2023-08-30 NOTE — Telephone Encounter (Signed)
MRI Abdomen has been scheduled for 09/12/23 @ 11:15 ; Check in @ 11   Notified pt of date,time and instructions.

## 2023-09-04 ENCOUNTER — Encounter: Payer: Self-pay | Admitting: Oncology

## 2023-09-12 NOTE — Progress Notes (Incomplete)
[facility]  [address] [place],  [place]  [address] [phone]  Clinic Day:  07/15/2023  Referring physician: Galvin Proffer, MD   HISTORY OF PRESENT ILLNESS:  The patient is a 82 y.o. 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  Since November 2023, he has been on pemigatinib for his disease management.  He comes in today for routine follow-up.  Since his last visit, the patient has been doing okay.  However, he admits his fluid and nutritional intake have not been as good as it has been in the past.  With respect to his cholangiocarcinoma, he denies having any new symptoms or findings which concern him for disease progression.   PHYSICAL EXAM:  There were no vitals taken for this visit.  His systolic blood pressure dropped to below 100 upon standing Wt Readings from Last 3 Encounters:  07/15/23 206 lb (93.4 kg)  05/17/23 212 lb (96.2 kg)  05/15/23 214 lb 14.4 oz (97.5 kg)   There is no height or weight on file to calculate BMI.  Performance status (ECOG): 1 - Symptomatic but completely ambulatory  Physical Exam Vitals and nursing note reviewed.  Constitutional:      General: He is not in acute distress.    Appearance: Normal appearance. He is normal weight.  HENT:     Head: Normocephalic and atraumatic.     Mouth/Throat:     Mouth: Mucous membranes are moist.     Pharynx: Oropharynx is clear. No oropharyngeal exudate or posterior oropharyngeal erythema.  Eyes:     General: No scleral icterus.    Extraocular Movements: Extraocular movements intact.     Conjunctiva/sclera: Conjunctivae normal.     Pupils: Pupils are equal, round, and reactive to light.  Cardiovascular:     Rate and Rhythm: Normal rate and regular rhythm.     Heart sounds: Normal heart sounds. No murmur heard.    No friction rub. No gallop.   Pulmonary:     Effort: Pulmonary effort is normal.     Breath sounds: Normal breath sounds. No wheezing, rhonchi or rales.  Abdominal:     General: Bowel sounds are normal. There is no distension.     Palpations: Abdomen is soft. There is no hepatomegaly, splenomegaly or mass.     Tenderness: There is no abdominal tenderness.  Musculoskeletal:        General: Normal range of motion.     Cervical back: Normal range of motion and neck supple. No tenderness.     Right lower leg: No edema.     Left lower leg: No edema.  Lymphadenopathy:     Cervical: No cervical adenopathy.     Upper Body:     Right upper body: No supraclavicular or axillary adenopathy.     Left upper body: No supraclavicular or axillary adenopathy.     Lower Body: No right inguinal adenopathy. No left inguinal adenopathy.  Skin:    General: Skin is warm and dry.     Coloration: Skin is not jaundiced.     Findings: No rash.  Neurological:     Mental Status: He is alert and oriented to person, place, and time.     Cranial Nerves: No cranial nerve deficit.  Psychiatric:        Mood and Affect: Mood normal.        Behavior: Behavior normal.  Thought Content: Thought content normal.    LABS:  Latest Reference Range & Units 07/15/23 00:00 07/15/23 13:31  Sodium 135 - 145 mmol/L  139  Potassium 3.5 - 5.1 mmol/L  4.4  Chloride 98 - 111 mmol/L  100  CO2 22 - 32 mmol/L  28  Glucose 70 - 99 mg/dL  94  BUN 8 - 23 mg/dL  35 (H)  Creatinine 1.61 - 1.24 mg/dL  0.96 (H)  Calcium 8.9 - 10.3 mg/dL  9.8  Anion gap 5 - 15   11  Alkaline Phosphatase 38 - 126 U/L  64  Albumin 3.5 - 5.0 g/dL  4.1  AST 15 - 41 U/L  23  ALT 0 - 44 U/L  17  Total Protein 6.5 - 8.1 g/dL  7.3  Total Bilirubin 0.3 - 1.2 mg/dL  0.7  GFR, Est Non African American >60 mL/min  28 (L)  CBC W DIFFERENTIAL ([place] [facility] SCANNED REPORT)   Rpt (IP)  WBC  10.2 (E)   RBC 3.87 - 5.11  4.12 (E)   Hemoglobin 13.5 - 17.5  12.6 ! (E)   HCT 41 - 53  38 !  (E)   Platelets 150 - 400 K/uL 150 (E)   NEUT#  7.65 (E)   (H): Data is abnormally high (L): Data is abnormally low !: Data is abnormal (IP): In Process Rpt: View report in Results Review for more information (E): External lab result  ASSESSMENT & PLAN:  Assessment/Plan:   An 82 y.o. male with known metastatic FGFR2 mutation positive cholangiocarcinoma.  His labs and physical today were clearly consistent with him being dehydrated.  Based upon this, he will receive IV fluids today.  I stressed to the patient and his family the importance of him maintaining his fluid and caloric intake.  If not, his baseline health could take a turn for the worse.  Otherwise, I will see him back in 2 months for repeat clinical assessment.  A repeat abdominal MRI will be done before his next visit to ascertain his new disease baseline while on his pemigatinib therapy.  The patient understands all the plans discussed today and is in agreement with them.  Amiaya Mcneeley Kirby Funk, MD

## 2023-09-12 NOTE — Progress Notes (Unsigned)
 [facility]  [address] [place],  [place]  [address] [phone]  Clinic Day:  07/15/2023  Referring physician: Galvin Proffer, MD   HISTORY OF PRESENT ILLNESS:  The patient is a 82 y.o. 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  Since November 2023, he has been on pemigatinib for his disease management.  He comes in today for routine follow-up.  Since his last visit, the patient has been doing okay.  However, he admits his fluid and nutritional intake have not been as good as it has been in the past.  With respect to his cholangiocarcinoma, he denies having any new symptoms or findings which concern him for disease progression.   PHYSICAL EXAM:  There were no vitals taken for this visit.  His systolic blood pressure dropped to below 100 upon standing Wt Readings from Last 3 Encounters:  07/15/23 206 lb (93.4 kg)  05/17/23 212 lb (96.2 kg)  05/15/23 214 lb 14.4 oz (97.5 kg)   There is no height or weight on file to calculate BMI.  Performance status (ECOG): 1 - Symptomatic but completely ambulatory  Physical Exam Vitals and nursing note reviewed.  Constitutional:      General: He is not in acute distress.    Appearance: Normal appearance. He is normal weight.  HENT:     Head: Normocephalic and atraumatic.     Mouth/Throat:     Mouth: Mucous membranes are moist.     Pharynx: Oropharynx is clear. No oropharyngeal exudate or posterior oropharyngeal erythema.  Eyes:     General: No scleral icterus.    Extraocular Movements: Extraocular movements intact.     Conjunctiva/sclera: Conjunctivae normal.     Pupils: Pupils are equal, round, and reactive to light.  Cardiovascular:     Rate and Rhythm: Normal rate and regular rhythm.     Heart sounds: Normal heart sounds. No murmur heard.    No friction rub. No gallop.   Pulmonary:     Effort: Pulmonary effort is normal.     Breath sounds: Normal breath sounds. No wheezing, rhonchi or rales.  Abdominal:     General: Bowel sounds are normal. There is no distension.     Palpations: Abdomen is soft. There is no hepatomegaly, splenomegaly or mass.     Tenderness: There is no abdominal tenderness.  Musculoskeletal:        General: Normal range of motion.     Cervical back: Normal range of motion and neck supple. No tenderness.     Right lower leg: No edema.     Left lower leg: No edema.  Lymphadenopathy:     Cervical: No cervical adenopathy.     Upper Body:     Right upper body: No supraclavicular or axillary adenopathy.     Left upper body: No supraclavicular or axillary adenopathy.     Lower Body: No right inguinal adenopathy. No left inguinal adenopathy.  Skin:    General: Skin is warm and dry.     Coloration: Skin is not jaundiced.     Findings: No rash.  Neurological:     Mental Status: He is alert and oriented to person, place, and time.     Cranial Nerves: No cranial nerve deficit.  Psychiatric:        Mood and Affect: Mood normal.        Behavior: Behavior normal.  Thought Content: Thought content normal.    LABS:  Latest Reference Range & Units 07/15/23 00:00 07/15/23 13:31  Sodium 135 - 145 mmol/L  139  Potassium 3.5 - 5.1 mmol/L  4.4  Chloride 98 - 111 mmol/L  100  CO2 22 - 32 mmol/L  28  Glucose 70 - 99 mg/dL  94  BUN 8 - 23 mg/dL  35 (H)  Creatinine 1.61 - 1.24 mg/dL  0.96 (H)  Calcium 8.9 - 10.3 mg/dL  9.8  Anion gap 5 - 15   11  Alkaline Phosphatase 38 - 126 U/L  64  Albumin 3.5 - 5.0 g/dL  4.1  AST 15 - 41 U/L  23  ALT 0 - 44 U/L  17  Total Protein 6.5 - 8.1 g/dL  7.3  Total Bilirubin 0.3 - 1.2 mg/dL  0.7  GFR, Est Non African American >60 mL/min  28 (L)  CBC W DIFFERENTIAL ([facility] SCANNED REPORT)   Rpt (IP)  WBC  10.2 (E)   RBC 3.87 - 5.11  4.12 (E)   Hemoglobin 13.5 - 17.5  12.6 ! (E)   HCT 41 - 53  38 !  (E)   Platelets 150 - 400 K/uL 150 (E)   NEUT#  7.65 (E)   (H): Data is abnormally high (L): Data is abnormally low !: Data is abnormal (IP): In Process Rpt: View report in Results Review for more information (E): External lab result  ASSESSMENT & PLAN:  Assessment/Plan:   An 82 y.o. male with known metastatic FGFR2 mutation positive cholangiocarcinoma.  His labs and physical today were clearly consistent with him being dehydrated.  Based upon this, he will receive IV fluids today.  I stressed to the patient and his family the importance of him maintaining his fluid and caloric intake.  If not, his baseline health could take a turn for the worse.  Otherwise, I will see him back in 2 months for repeat clinical assessment.  A repeat abdominal MRI will be done before his next visit to ascertain his new disease baseline while on his pemigatinib therapy.  The patient understands all the plans discussed today and is in agreement with them.  Amiaya Mcneeley Kirby Funk, MD

## 2023-09-13 ENCOUNTER — Inpatient Hospital Stay: Payer: Medicare Other | Attending: Oncology | Admitting: Oncology

## 2023-09-13 ENCOUNTER — Inpatient Hospital Stay: Payer: Medicare Other

## 2023-09-13 ENCOUNTER — Other Ambulatory Visit: Payer: Self-pay | Admitting: Oncology

## 2023-09-13 VITALS — BP 151/102 | HR 77 | Temp 98.0°F | Resp 16 | Ht 74.0 in | Wt 211.3 lb

## 2023-09-13 DIAGNOSIS — C221 Intrahepatic bile duct carcinoma: Secondary | ICD-10-CM | POA: Diagnosis not present

## 2023-09-13 DIAGNOSIS — C786 Secondary malignant neoplasm of retroperitoneum and peritoneum: Secondary | ICD-10-CM | POA: Insufficient documentation

## 2023-09-13 DIAGNOSIS — I700 Atherosclerosis of aorta: Secondary | ICD-10-CM | POA: Diagnosis not present

## 2023-09-13 LAB — CBC WITH DIFFERENTIAL (CANCER CENTER ONLY)
Abs Immature Granulocytes: 0 10*3/uL (ref 0.00–0.07)
Basophils Absolute: 0.1 10*3/uL (ref 0.0–0.1)
Basophils Relative: 1 %
Eosinophils Absolute: 0.2 10*3/uL (ref 0.0–0.5)
Eosinophils Relative: 2 %
HCT: 35.2 % — ABNORMAL LOW (ref 39.0–52.0)
Hemoglobin: 11.9 g/dL — ABNORMAL LOW (ref 13.0–17.0)
Immature Granulocytes: 0 %
Lymphocytes Relative: 18 %
Lymphs Abs: 1.4 10*3/uL (ref 0.7–4.0)
MCH: 31.2 pg (ref 26.0–34.0)
MCHC: 33.8 g/dL (ref 30.0–36.0)
MCV: 92.1 fL (ref 80.0–100.0)
Monocytes Absolute: 0.7 10*3/uL (ref 0.1–1.0)
Monocytes Relative: 9 %
Neutro Abs: 5.1 10*3/uL (ref 1.7–7.7)
Neutrophils Relative %: 70 %
Platelet Count: 133 10*3/uL — ABNORMAL LOW (ref 150–400)
RBC: 3.82 MIL/uL — ABNORMAL LOW (ref 4.22–5.81)
RDW: 12.9 % (ref 11.5–15.5)
WBC Count: 7.5 10*3/uL (ref 4.0–10.5)
nRBC: 0 % (ref 0.0–0.2)
nRBC: 0 /100{WBCs}

## 2023-09-13 LAB — CMP (CANCER CENTER ONLY)
ALT: 18 U/L (ref 0–44)
AST: 29 U/L (ref 15–41)
Albumin: 4 g/dL (ref 3.5–5.0)
Alkaline Phosphatase: 65 U/L (ref 38–126)
Anion gap: 11 (ref 5–15)
BUN: 18 mg/dL (ref 8–23)
CO2: 24 mmol/L (ref 22–32)
Calcium: 9.6 mg/dL (ref 8.9–10.3)
Chloride: 106 mmol/L (ref 98–111)
Creatinine: 1.39 mg/dL — ABNORMAL HIGH (ref 0.61–1.24)
GFR, Estimated: 51 mL/min — ABNORMAL LOW (ref >60)
Glucose, Bld: 105 mg/dL — ABNORMAL HIGH (ref 70–99)
Potassium: 4.3 mmol/L (ref 3.5–5.1)
Sodium: 140 mmol/L (ref 135–145)
Total Bilirubin: 0.5 mg/dL (ref <1.2)
Total Protein: 7 g/dL (ref 6.5–8.1)

## 2023-09-27 ENCOUNTER — Other Ambulatory Visit: Payer: Self-pay

## 2023-09-27 DIAGNOSIS — C221 Intrahepatic bile duct carcinoma: Secondary | ICD-10-CM

## 2023-09-27 MED ORDER — PEMIGATINIB 9 MG PO TABS: 9.0000 mg | ORAL_TABLET | Freq: Every day | ORAL | 2 refills | Status: DC

## 2023-09-28 ENCOUNTER — Encounter: Payer: Self-pay | Admitting: Oncology

## 2023-09-30 ENCOUNTER — Encounter: Payer: Self-pay | Admitting: Oncology

## 2023-10-03 ENCOUNTER — Other Ambulatory Visit (HOSPITAL_COMMUNITY): Payer: Self-pay

## 2023-10-07 ENCOUNTER — Telehealth: Payer: Self-pay

## 2023-10-07 NOTE — Telephone Encounter (Signed)
-----   Message from Estefana FORBES Moellers sent at 10/03/2023 10:56 AM EST ----- Regarding: FW: Pemigatinib  Per patient's pharmacy delivery is scheduled for tomorrow ----- Message ----- From: Medlin, Claire E, CPhT Sent: 10/03/2023   9:05 AM EST To: Kaitlyn M Schomburg, RPH Subject: RE: Pemigatinib                                 I have contacted our representative at [employer] to see if they can clarify what the issue is. From what I can pull electronically, his insurance paid a claim and left a copay of $1979.84 and that cost was covered in full by his PANF grant. ----- Message ----- From: Emanuel Carolyn HERO, [facility] Sent: 10/03/2023   8:34 AM EST To: Estefana FORBES Moellers, CPhT Subject: FW: Pemigatinib                                  ----- Message ----- From: Sheron Falling, RN Sent: 10/02/2023   4:54 PM EST To: Kaitlyn M Schomburg, RPH Subject: Pemigatinib                                     Mr. Dorner called late today saying they haven't delivered his Pemigatinib .  They had called him a said there was something wrong with his co-pay?   Please look into this for him.   Thanks.

## 2023-10-17 ENCOUNTER — Ambulatory Visit: Payer: Medicare Other | Attending: Cardiology

## 2023-10-17 DIAGNOSIS — R0609 Other forms of dyspnea: Secondary | ICD-10-CM | POA: Diagnosis not present

## 2023-10-17 LAB — ECHOCARDIOGRAM COMPLETE
Area-P 1/2: 3.75 cm2
Est EF: 35
S' Lateral: 5.2 cm

## 2023-10-31 ENCOUNTER — Telehealth: Payer: Self-pay

## 2023-10-31 NOTE — Telephone Encounter (Signed)
 Left vm to return call.

## 2023-10-31 NOTE — Telephone Encounter (Signed)
-----   Message from Lamar Fitch sent at 10/18/2023  2:29 PM EST ----- Echocardiogram showed worsening of the left ventricle ejection fraction.  Will talk during next visit what to do about the situation which is very difficult he had difficulty tolerating medications

## 2023-11-13 NOTE — Progress Notes (Unsigned)
[facility]  [address] [place],  [place]  [address] [phone]  Clinic Day:  11/14/2023  Referring physician: Galvin Proffer, MD   HISTORY OF PRESENT ILLNESS:  The patient is a 83 y.o. 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  Since May 2023, he has been on pemigatinib for his disease management.  Abdominal MRIs have shown his disease to be under ideal control since his pemigatinib was started.  He comes in today for routine follow-up.  Since his last visit, the patient has been doing well.  His fluid and nutritional intake have been better.  With respect to his cholangiocarcinoma, he denies having any new symptoms or findings which concern him for disease progression.  He also denies having any vision problems as a pertains to his pemigatinib therapy.  He is dealing with mild blepharitis, for which he has been prescribed antibiotics.  With respect to his cholangiocarcinoma history, the patient did undergo a segmental resection of his liver in April 2019, whose pathology revealed a 4 cm intrahepatic cholangiocarcinoma.  He also had a subcutaneous nodule develop around his umbilicus, which was biopsy-proven to be metastatic cholangiocarcinoma in July 2021.  Due to scans showing other sites of metastatic disease within his peritoneum, he was initially placed on cisplatin/gemcitabine, for which he received 8 cycles.  He continued on single agent gemcitabine for an additional 8 cycles until CT scans showed disease progression, that was proven per a subcutaneous nodule biopsy in April 2023.  This led to him being placed on pemigatinib in May 2023, for which he continues to take.    PHYSICAL EXAM:  Blood pressure (!) 160/91, pulse 72, temperature 98.2 F (36.8 C), temperature source Oral, resp. rate 16, height 6\' 2"  (1.88 m), weight 215 lb 8 oz  (97.8 kg), SpO2 93%. Wt Readings from Last 3 Encounters:  11/14/23 215 lb 8 oz (97.8 kg)  09/13/23 211 lb 4.8 oz (95.8 kg)  07/15/23 206 lb (93.4 kg)   Body mass index is 27.67 kg/m. Performance status (ECOG): 1 - Symptomatic but completely ambulatory Physical Exam Constitutional:      Appearance: Normal appearance. He is not ill-appearing.     Comments: He physically looks better versus previous visits  HENT:     Mouth/Throat:     Mouth: Mucous membranes are moist.     Pharynx: Oropharynx is clear. No oropharyngeal exudate or posterior oropharyngeal erythema.  Eyes:     Comments: Mildly inflamed and erythematous eyelids, right greater than left  Cardiovascular:     Rate and Rhythm: Normal rate and regular rhythm.     Heart sounds: No murmur heard.    No friction rub. No gallop.  Pulmonary:     Effort: Pulmonary effort is normal. No respiratory distress.     Breath sounds: Normal breath sounds. No wheezing, rhonchi or rales.  Abdominal:     General: Bowel sounds are normal. There is no distension.     Palpations: Abdomen is soft. There is no mass.     Tenderness: There is no abdominal tenderness.  Musculoskeletal:        General: No swelling.     Right lower leg: No edema.     Left lower leg: No edema.  Lymphadenopathy:     Cervical: No cervical adenopathy.     Upper Body:     Right upper body: No supraclavicular or  axillary adenopathy.     Left upper body: No supraclavicular or axillary adenopathy.     Lower Body: No right inguinal adenopathy. No left inguinal adenopathy.  Skin:    General: Skin is warm.     Coloration: Skin is not jaundiced.     Findings: No lesion or rash.  Neurological:     General: No focal deficit present.     Mental Status: He is alert and oriented to person, place, and time. Mental status is at baseline.  Psychiatric:        Mood and Affect: Mood normal.        Behavior: Behavior normal.        Thought Content: Thought content normal.     LABS:      Latest Ref Rng & Units 11/14/2023    9:33 AM 09/13/2023    1:55 PM 07/15/2023   12:00 AM  CBC  WBC 4.0 - 10.5 K/uL 8.9  7.5  10.2      Hemoglobin 13.0 - 17.0 g/dL 16.1  09.6  04.5      Hematocrit 39.0 - 52.0 % 40.0  35.2  38      Platelets 150 - 400 K/uL 143  133  150         This result is from an external source.      Latest Ref Rng & Units 11/14/2023    9:33 AM 09/13/2023    1:55 PM 07/15/2023    1:31 PM  CMP  Glucose 70 - 99 mg/dL 73  409  94   BUN 8 - 23 mg/dL 29  18  35   Creatinine 0.61 - 1.24 mg/dL 8.11  9.14  7.82   Sodium 135 - 145 mmol/L 142  140  139   Potassium 3.5 - 5.1 mmol/L 4.4  4.3  4.4   Chloride 98 - 111 mmol/L 105  106  100   CO2 22 - 32 mmol/L 28  24  28    Calcium 8.9 - 10.3 mg/dL 9.4  9.6  9.8   Total Protein 6.5 - 8.1 g/dL 7.1  7.0  7.3   Total Bilirubin 0.0 - 1.2 mg/dL 0.6  0.5  0.7   Alkaline Phos 38 - 126 U/L 70  65  64   AST 15 - 41 U/L 33  29  23   ALT 0 - 44 U/L 20  18  17      ASSESSMENT & PLAN:   Assessment/Plan:  An 83 y.o. male with known metastatic FGFR2 mutation positive cholangiocarcinoma.  Clinically, the patient appears to be doing very well following his oral pemigatinib therapy for his disease.  He will continue to take this medicine at 9 mg daily, every 2 out of 3 weeks.  I will see him back in 2 months for repeat clinical assessment.  A repeat abdominal MRI will be done a day before his next visit to ascertain his new disease baseline while on his oral pemigatinib therapy.  The patient understands all the plans discussed today and is in agreement with them.    Martine Trageser Kirby Funk, MD

## 2023-11-14 ENCOUNTER — Other Ambulatory Visit: Payer: Self-pay | Admitting: Oncology

## 2023-11-14 ENCOUNTER — Telehealth: Payer: Self-pay | Admitting: Oncology

## 2023-11-14 ENCOUNTER — Inpatient Hospital Stay: Payer: Medicare Other | Attending: Oncology | Admitting: Oncology

## 2023-11-14 ENCOUNTER — Inpatient Hospital Stay: Payer: Medicare Other

## 2023-11-14 VITALS — BP 160/91 | HR 72 | Temp 98.2°F | Resp 16 | Ht 74.0 in | Wt 215.5 lb

## 2023-11-14 DIAGNOSIS — C221 Intrahepatic bile duct carcinoma: Secondary | ICD-10-CM

## 2023-11-14 DIAGNOSIS — C240 Malignant neoplasm of extrahepatic bile duct: Secondary | ICD-10-CM | POA: Diagnosis not present

## 2023-11-14 DIAGNOSIS — Z9221 Personal history of antineoplastic chemotherapy: Secondary | ICD-10-CM | POA: Insufficient documentation

## 2023-11-14 DIAGNOSIS — C786 Secondary malignant neoplasm of retroperitoneum and peritoneum: Secondary | ICD-10-CM | POA: Insufficient documentation

## 2023-11-14 LAB — CMP (CANCER CENTER ONLY)
ALT: 20 U/L (ref 0–44)
AST: 33 U/L (ref 15–41)
Albumin: 4 g/dL (ref 3.5–5.0)
Alkaline Phosphatase: 70 U/L (ref 38–126)
Anion gap: 9 (ref 5–15)
BUN: 29 mg/dL — ABNORMAL HIGH (ref 8–23)
CO2: 28 mmol/L (ref 22–32)
Calcium: 9.4 mg/dL (ref 8.9–10.3)
Chloride: 105 mmol/L (ref 98–111)
Creatinine: 1.6 mg/dL — ABNORMAL HIGH (ref 0.61–1.24)
GFR, Estimated: 43 mL/min — ABNORMAL LOW (ref >60)
Glucose, Bld: 73 mg/dL (ref 70–99)
Potassium: 4.4 mmol/L (ref 3.5–5.1)
Sodium: 142 mmol/L (ref 135–145)
Total Bilirubin: 0.6 mg/dL (ref 0.0–1.2)
Total Protein: 7.1 g/dL (ref 6.5–8.1)

## 2023-11-14 LAB — CBC WITH DIFFERENTIAL (CANCER CENTER ONLY)
Abs Immature Granulocytes: 0.04 10*3/uL (ref 0.00–0.07)
Basophils Absolute: 0.1 10*3/uL (ref 0.0–0.1)
Basophils Relative: 1 %
Eosinophils Absolute: 0.4 10*3/uL (ref 0.0–0.5)
Eosinophils Relative: 4 %
HCT: 40 % (ref 39.0–52.0)
Hemoglobin: 12.8 g/dL — ABNORMAL LOW (ref 13.0–17.0)
Immature Granulocytes: 1 %
Lymphocytes Relative: 23 %
Lymphs Abs: 2 10*3/uL (ref 0.7–4.0)
MCH: 30.4 pg (ref 26.0–34.0)
MCHC: 32 g/dL (ref 30.0–36.0)
MCV: 95 fL (ref 80.0–100.0)
Monocytes Absolute: 0.9 10*3/uL (ref 0.1–1.0)
Monocytes Relative: 10 %
Neutro Abs: 5.5 10*3/uL (ref 1.7–7.7)
Neutrophils Relative %: 61 %
Platelet Count: 143 10*3/uL — ABNORMAL LOW (ref 150–400)
RBC: 4.21 MIL/uL — ABNORMAL LOW (ref 4.22–5.81)
RDW: 12.9 % (ref 11.5–15.5)
WBC Count: 8.9 10*3/uL (ref 4.0–10.5)
nRBC: 0 % (ref 0.0–0.2)
nRBC: 0 /100{WBCs}

## 2023-11-14 NOTE — Telephone Encounter (Signed)
Contacted pt to schedule an appt. Unable to reach via phone, voicemail was left.     Follow-Up Information  Follow-up disposition: Return in about 2 months (around 01/14/2024).  Check out comments: Labs/scans 01-13-24

## 2023-11-14 NOTE — Progress Notes (Signed)
[facility] CSW Progress Note  Clinical Child psychotherapist  received email from patient's nurse regarding letter patient received from [employer] indicating cancellation of coverage.  CSW placed call to [employer] and confirmed patient is no longer enrolled in assistance program and review of website indicates funds are no longer available. CSW forwarded email to pharmacy team for additional guidance.   Marguerita Merles, LCSW Clinical Social Worker [facility]

## 2023-11-18 ENCOUNTER — Encounter: Payer: Self-pay | Admitting: Cardiology

## 2023-11-18 ENCOUNTER — Ambulatory Visit: Payer: Medicare Other | Attending: Cardiology | Admitting: Cardiology

## 2023-11-18 VITALS — BP 150/90 | HR 84 | Ht 74.0 in

## 2023-11-18 DIAGNOSIS — I5042 Chronic combined systolic (congestive) and diastolic (congestive) heart failure: Secondary | ICD-10-CM | POA: Diagnosis present

## 2023-11-18 DIAGNOSIS — I2510 Atherosclerotic heart disease of native coronary artery without angina pectoris: Secondary | ICD-10-CM | POA: Diagnosis not present

## 2023-11-18 DIAGNOSIS — I255 Ischemic cardiomyopathy: Secondary | ICD-10-CM

## 2023-11-18 DIAGNOSIS — I10 Essential (primary) hypertension: Secondary | ICD-10-CM | POA: Diagnosis not present

## 2023-11-18 DIAGNOSIS — R0609 Other forms of dyspnea: Secondary | ICD-10-CM

## 2023-11-18 DIAGNOSIS — N1831 Chronic kidney disease, stage 3a: Secondary | ICD-10-CM | POA: Diagnosis present

## 2023-11-18 DIAGNOSIS — I77810 Thoracic aortic ectasia: Secondary | ICD-10-CM | POA: Diagnosis present

## 2023-11-18 NOTE — Patient Instructions (Signed)
 Medication Instructions:  Your physician recommends that you continue on your current medications as directed. Please refer to the Current Medication list given to you today.  Take your Lasix today  *If you need a refill on your cardiac medications before your next appointment, please call your pharmacy*   Lab Work: Your physician recommends that you have a BMP and ProBNP today in the office.  If you have labs (blood work) drawn today and your tests are completely normal, you will receive your results only by: MyChart Message (if you have MyChart) OR A paper copy in the mail If you have any lab test that is abnormal or we need to change your treatment, we will call you to review the results.   Testing/Procedures: None ordered   Follow-Up: At [facility], you and your health needs are our priority.  As part of our continuing mission to provide you with exceptional heart care, we have created designated Provider Care Teams.  These Care Teams include your primary Cardiologist (physician) and Advanced Practice Providers (APPs -  Physician Assistants and Nurse Practitioners) who all work together to provide you with the care you need, when you need it.  We recommend signing up for the patient portal called "MyChart".  Sign up information is provided on this After Visit Summary.  MyChart is used to connect with patients for Virtual Visits (Telemedicine).  Patients are able to view lab/test results, encounter notes, upcoming appointments, etc.  Non-urgent messages can be sent to your provider as well.   To learn more about what you can do with MyChart, go to [email or web address].    Your next appointment:   4 week(s)  The format for your next appointment:   In Person  Provider:   Gypsy Balsam, MD or Wallis Bamberg, NP Rosalita Levan)    Other Instructions none  Important Information About Sugar

## 2023-11-18 NOTE — Progress Notes (Signed)
 Cardiology Office Note:    Date:  11/18/2023   ID:  Gary [name], DOB [birth year], MRN [id number]  PCP:  Galvin Proffer, MD   [facility] Providers Cardiologist:  Gypsy Balsam, MD     Referring MD: Galvin Proffer, MD     History of Present Illness:    Gary [name] is a 83 y.o. 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  Had been evaluated by his PCP for orthostasis and falls, his Coreg was stopped and he was feeling somewhat better at this time.    Most recently evaluated by Dr. Bing Matter on 05/17/2023, was stable from a cardiac perspective, did have some chest wall tenderness upon palpation.  No changes were made and he was advised follow-up in 6 months.  He had a repeat echo arrange for shortness of breath, revealing an EF of 35%.  He presents today accompanied by a friend for follow-up of his shortness of breath.  We discussed his recent echocardiogram. He said this was  arranged as he had been short of breath, most noticeable when he lays down at night.  He is not weighing himself daily, sporadically, he is also taken his Lasix sporadically.  He denies pedal edema however there is appreciable edema today. He denies chest pain, palpitations, dyspnea, pnd, n, v, dizziness, syncope, weight gain, or early satiety.    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   Chronic obstructive pulmonary disease (HCC) 12/13/2020   Chronic pain syndrome 12/13/2020   Chronic systolic heart failure (HCC) 12/13/2020   Coronary artery disease with PTCA and stenting of mid and distal circumflex artery on 07/11/2020 07/29/2020   Diastolic congestive heart failure (HCC) 04/24/2019   Dizziness 08/30/2020   Dyspnea on exertion 04/24/2019   Encounter for therapeutic drug level monitoring 06/15/2020   Essential hypertension 04/24/2019   Gastro-esophageal reflux disease without esophagitis 12/13/2020   Generalized anxiety disorder 12/13/2020   Hyperglycemia due to type 2 diabetes mellitus (HCC) 12/13/2020   Hypertension    Hypothyroidism 12/13/2020   Liver tumor 12/27/2017   Formatting of this note might be different from the original. 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) 08/30/2020   Memory loss 08/30/2020   Migraine without aura, not intractable, without status migrainosus 08/30/2020   Mild intermittent asthma 12/13/2020   Mild major depression, single episode (HCC) 12/13/2020   Mixed hyperlipidemia 12/13/2020   NSTEMI (non-ST elevated myocardial infarction) (HCC) 07/09/2020   Orthostatic hypotension 10/20/2020   Other long term  (current) drug therapy 12/13/2020   Other vitamin B12 deficiency anemias 12/13/2020   Pain in left hip 12/13/2020   Preop cardiovascular exam 06/15/2020   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/30/2020   Type 2 diabetes mellitus without complications (HCC) 12/13/2020   Unsteadiness on feet 08/30/2020   UTI (urinary tract infection) 01/08/2018   Vitamin D deficiency 12/13/2020    Past Surgical History:  Procedure Laterality Date   BILE DUCT EXPLORATION     To remove cancer   CORONARY STENT INTERVENTION N/A 07/11/2020   Procedure: CORONARY STENT INTERVENTION;  Surgeon: Marykay Lex, MD;  Location: [facility] INVASIVE CV LAB;  Service: Cardiovascular;  Laterality: N/A;   LEFT HEART CATH AND CORONARY ANGIOGRAPHY N/A 07/11/2020   Procedure: LEFT HEART CATH AND CORONARY ANGIOGRAPHY;  Surgeon: Marykay Lex, MD;  Location: [facility] INVASIVE CV LAB;  Service: Cardiovascular;  Laterality: N/A;   MOHS SURGERY     Basil cell on nose    Current Medications: Current Meds  Medication Sig   ALPRAZolam (XANAX) 0.5 MG tablet Take 0.5 mg by mouth 3 (three) times daily as needed for anxiety.    aspirin EC 81 MG tablet Take 1 tablet (81 mg total) by mouth daily. Swallow whole.   atorvastatin (LIPITOR) 10 MG tablet Take 10 mg by mouth daily.   buPROPion (WELLBUTRIN XL) 300 MG 24 hr tablet Take 1 tablet by mouth daily. For 90 days   calcium carbonate (TUMS - DOSED IN MG ELEMENTAL CALCIUM) 500 MG chewable tablet Chew 1 tablet by mouth in the morning and at bedtime. Total of 1200 mg daily   clopidogrel (PLAVIX) 75 MG tablet Take 75 mg by mouth daily.   ferrous sulfate 325 (65 FE) MG tablet Take 325 mg by mouth daily with breakfast.   folic acid (FOLVITE) 1 MG tablet Take 1 tablet (1 mg total) by mouth daily.   furosemide (LASIX) 20 MG tablet Take 1 tablet (20 mg total) by mouth daily.   HYDROcodone-acetaminophen (NORCO) 10-325 MG tablet Take 1 tablet by mouth every 6 (six) hours as needed for moderate pain or severe  pain.   ketoconazole (NIZORAL) 2 % cream Apply 1 Application topically daily.   magnesium citrate SOLN Take 1 Bottle by mouth once. Pt to take 1/2 bottle. If no results in 1hr, take the other 1/2.   montelukast (SINGULAIR) 10 MG tablet Take 1 tablet by mouth daily.   Multiple Vitamin (MULTIVITAMIN) tablet Take 1 tablet by mouth daily. Unknown Strength per patient   nitroGLYCERIN (NITROSTAT) 0.4 MG SL tablet Place 1 tablet (0.4 mg total) under the tongue every 5 (five) minutes as needed for chest pain.   Omega-3 Fatty Acids (FISH OIL) 1200 MG CPDR Take 1 tablet by mouth daily.   omeprazole (PRILOSEC) 40 MG capsule Take 1 capsule by mouth daily as needed (Acid reflux).   ondansetron (ZOFRAN) 4 MG tablet TAKE 1 TABLET BY MOUTH EVERY 4 HOURS AS NEEDED FOR NAUSEA OR VOMITING (Patient taking differently: Take 4 mg by mouth every 8 (eight) hours as needed for nausea or vomiting.)   pemigatinib (PEMAZYRE) 9 MG tablet Take 1 tablet (9 mg total) by mouth  daily. Take at approximately the same time each day. Take for 14 days on, 7 days off, repeat every 21d.   Plecanatide (TRULANCE) 3 MG TABS Take 3 mg by mouth 2 (two) times a week.   vitamin B-12 (CYANOCOBALAMIN) 250 MCG tablet Take 250 mcg by mouth daily.   Vitamin D, Ergocalciferol, 50 MCG (2000 UT) CAPS Take 1 tablet by mouth daily.     Allergies:   Heparin, Pork-derived products, and Amoxicillin   Social History   Socioeconomic History   Marital status: Widowed    Spouse name: Not on file   Number of children: Not on file   Years of education: Not on file   Highest education level: Not on file  Occupational History   Not on file  Tobacco Use   Smoking status: Never   Smokeless tobacco: Current    Types: Chew  Substance and Sexual Activity   Alcohol use: Not Currently   Drug use: Never   Sexual activity: Not on file  Other Topics Concern   Not on file  Social History Narrative   Not on file   Social Drivers of Health   Financial  Resource Strain: Not on file  Food Insecurity: Not on file  Transportation Needs: Not on file  Physical Activity: Not on file  Stress: Not on file  Social Connections: Not on file     Family History: The patient's family history includes Breast cancer in his sister and sister; Cancer in his father.  ROS:   Please see the history of present illness.     All other systems reviewed and are negative.  EKGs/Labs/Other Studies Reviewed:    The following studies were reviewed today: Cardiac Studies & Procedures   ______________________________________________________________________________________________ CARDIAC CATHETERIZATION  CARDIAC CATHETERIZATION 07/11/2020  Narrative  Mid Cx to Dist Cx lesion is 85% stenosed.  A drug-eluting stent was successfully placed using a STENT RESOLUTE ONYX 2.5X12. Postdilated 2.65 mm  Post intervention, there is a 0% residual stenosis.  -------------  Suezanne Jacquet LM lesion is 25% stenosed.  Mid LM to Dist LM lesion is 25% stenosed with 60% stenosed side branch in Ost Cx to Prox Cx. This involves the ostium of Ramus and 1st Mrg  Ramus lesion is 60% stenosed. 1st Mrg lesion is 40% stenosed.  Prox LAD lesion is 60% stenosed -discrete eccentric lesion  Mid LAD lesion is 60% stenosed -> focal concentric lesion in the bend  Prox RCA lesion is 15% stenosed.  SUMMARY  Multivessel CAD:  Clear CULPRIT LESION noted as 85% ulcerated mLCx(AVG Cx), -->  Successful DES PCI reducing to 0%: Resolute Onyx DES 2.5 mm x 12 mm - post-dilated to 2.65 mm  Tifurcation AVG Cx-RI-1stMrg with each branch ~50-60%, prox LAD focal /eccentric ~60%,  mid LAD short ~60-65% concentric lesion.  Normal LVEDP - Echo showed mildly reduced LVEF with Inferolateral WMA.   RECOMMENDATIONS  Transfer to 6E post PCI.  Continue to treat residual disease medically to allow recovery from recent surgery.   Bryan Lemma, MD  Findings Coronary Findings Diagnostic  Dominance:  Right  Left Main Ost LM lesion is 25% stenosed. The lesion is focal and concentric. Mid LM to Dist LM lesion is 25% stenosed with 60% stenosed side branch in Ost Cx to Prox Cx. The lesion is located at the bifurcation, focal, discrete and concentric. Essentially trifurcation almost possibly from distal left main  Left Anterior Descending Prox LAD lesion is 60% stenosed. The lesion is discrete and eccentric. Mid LAD  lesion is 60% stenosed. The lesion is located at the bend, focal and concentric.  Second Diagonal Branch Vessel is small in size.  Ramus Intermedius Vessel is small. Ramus lesion is 60% stenosed. The lesion is focal.  Left Circumflex Vessel is moderate in size. Mid Cx to Dist Cx lesion is 85% stenosed. Vessel is the culprit lesion. The lesion is located proximal to the major branch, eccentric, irregular and ulcerative.  First Obtuse Marginal Branch 1st Mrg lesion is 40% stenosed. The lesion is located at the bifurcation and focal.  Second Obtuse Marginal Branch Vessel is large in size.  Right Coronary Artery Vessel was injected. Vessel is large. The vessel is moderately tortuous. Prox RCA lesion is 15% stenosed. The lesion is located at the bend.  Acute Marginal Branch Vessel is small in size. Course this is a tandem PDA  Right Ventricular Branch Vessel is small in size.  Right Posterior Descending Artery Vessel is small in size.  Right Posterior Atrioventricular Artery Vessel is small in size.  Second Right Posterolateral Branch Vessel is small in size.  Intervention  Mid Cx to Dist Cx lesion Stent Lesion length:  10 mm. CATH VISTA GUIDE 6FR XBLAD3.5 guide catheter was inserted. Lesion crossed with guidewire using a WIRE RUNTHROUGH .[id number]. Pre-stent angioplasty was performed using a BALLOON SAPPHIRE 2.5X12. Maximum pressure:  8 atm. Inflation time:  20 sec. 2 inflations A drug-eluting stent was successfully placed using a STENT RESOLUTE ONYX 2.5X12.  Unable to advance 15 mm stent Maximum pressure: 16 atm. Inflation time: 30 sec. Minimum lumen area:  2.7 mm. Stent strut is well apposed. Post-stent angioplasty was not performed. Post-Intervention Lesion Assessment The intervention was successful. Pre-interventional TIMI flow is 3. Post-intervention TIMI flow is 3. Treated lesion length:  12 mm. No complications occurred at this lesion. There is a 0% residual stenosis post intervention.   STRESS TESTS  MYOCARDIAL PERFUSION IMAGING 03/21/2023  Narrative   Findings are consistent with no ischemia and no infarction. The study is intermediate risk secondary to reduced EF.   No ST deviation was noted.   Left ventricular function is abnormal. Global function is mildly reduced. Nuclear stress EF: 41 %. The left ventricular ejection fraction is moderately decreased (30-44%). End diastolic cavity size is mildly enlarged.   Prior study available for comparison from 07/15/2019.   ECHOCARDIOGRAM  ECHOCARDIOGRAM COMPLETE 10/17/2023  Narrative ECHOCARDIOGRAM REPORT    Patient Name:   Gary [name] Date of Exam: 10/17/2023 Medical Rec #:  [id number]              Height:       74.0 in Accession #:    [id number]             Weight:       211.3 lb Date of Birth:  July 09, 1941              BSA:          2.225 m Patient Age:    82 years               BP:           130/62 mmHg Patient Gender: M                      HR:           77 bpm. Exam Location:  [place]  Procedure: 2D Echo, Cardiac Doppler, Color Doppler and Strain Analysis  Indications:    Dyspnea  on exertion [R06.09]  History:        Patient has prior history of Echocardiogram examinations. CHF, Previous Myocardial Infarction and CAD; Risk Factors:Hypertension and Dyslipidemia.  Sonographer:    Margreta Journey RDCS Referring Phys: [id number] Georgeanna Lea   Sonographer Comments: Suboptimal subcostal window and suboptimal parasternal window. IMPRESSIONS   1. Left  ventricular ejection fraction, by estimation, is 35%%. The left ventricle has moderate to severely decreased function. Left ventricular endocardial border not optimally defined to evaluate regional wall motion. The left ventricular internal cavity size was mildly dilated. Left ventricular diastolic parameters are consistent with Grade I diastolic dysfunction (impaired relaxation). 2. Right ventricular systolic function was not well visualized. The right ventricular size is normal. 3. Left atrial size was mild to moderately dilated. 4. The mitral valve is normal in structure. No evidence of mitral valve regurgitation. No evidence of mitral stenosis. 5. The aortic valve is tricuspid. Aortic valve regurgitation is not visualized. No aortic stenosis is present. 6. Aortic DTA is NWV. There is moderate dilatation of the aortic root, measuring 43 mm. 7. The inferior vena cava is normal in size with greater than 50% respiratory variability, suggesting right atrial pressure of 3 mmHg.  FINDINGS Left Ventricle: Left ventricular ejection fraction, by estimation, is 35%%. The left ventricle has moderate to severely decreased function. Left ventricular endocardial border not optimally defined to evaluate regional wall motion. Global longitudinal strain performed but not reported based on interpreter judgement due to suboptimal tracking. The left ventricular internal cavity size was mildly dilated. There is no left ventricular hypertrophy. Left ventricular diastolic parameters are consistent with Grade I diastolic dysfunction (impaired relaxation).  Right Ventricle: The right ventricular size is normal. Right vetricular wall thickness was not well visualized. Right ventricular systolic function was not well visualized.  Left Atrium: Left atrial size was mild to moderately dilated.  Right Atrium: Right atrial size was not well visualized.  Pericardium: There is no evidence of pericardial effusion.  Mitral  Valve: The mitral valve is normal in structure. No evidence of mitral valve regurgitation. No evidence of mitral valve stenosis.  Tricuspid Valve: The tricuspid valve is normal in structure. Tricuspid valve regurgitation is not demonstrated. No evidence of tricuspid stenosis.  Aortic Valve: The aortic valve is tricuspid. Aortic valve regurgitation is not visualized. No aortic stenosis is present.  Pulmonic Valve: The pulmonic valve was not well visualized. Pulmonic valve regurgitation is not visualized. No evidence of pulmonic stenosis.  Aorta: The aortic root is normal in size and structure, the ascending aorta was not well visualized, the aortic arch was not well visualized and DTA is NWV. There is moderate dilatation of the aortic root, measuring 43 mm.  Venous: The pulmonary veins were not well visualized. The inferior vena cava was not well visualized. The inferior vena cava is normal in size with greater than 50% respiratory variability, suggesting right atrial pressure of 3 mmHg.  IAS/Shunts: No atrial level shunt detected by color flow Doppler.   LEFT VENTRICLE PLAX 2D LVIDd:         6.20 cm   Diastology LVIDs:         5.20 cm   LV e' medial:    5.11 cm/s LV PW:         1.00 cm   LV E/e' medial:  10.5 LV IVS:        1.00 cm   LV e' lateral:   7.51 cm/s LVOT diam:     2.20 cm  LV E/e' lateral: 7.1 LV SV:         47 LV SV Index:   21 LVOT Area:     3.80 cm   RIGHT VENTRICLE RV Basal diam:  3.90 cm RV Mid diam:    3.30 cm RV S prime:     10.90 cm/s TAPSE (M-mode): 2.0 cm  LEFT ATRIUM              Index        RIGHT ATRIUM           Index LA diam:        3.40 cm  1.53 cm/m   RA Area:     12.20 cm LA Vol (A2C):   106.0 ml 47.64 ml/m  RA Volume:   26.80 ml  12.05 ml/m LA Vol (A4C):   81.0 ml  36.41 ml/m LA Biplane Vol: 94.2 ml  42.34 ml/m AORTIC VALVE LVOT Vmax:   64.30 cm/s LVOT Vmean:  42.267 cm/s LVOT VTI:    0.122 m  AORTA Ao Root diam: 4.30 cm Ao Asc diam:   4.10 cm  MITRAL VALVE MV Area (PHT): 3.75 cm    SHUNTS MV Decel Time: 203 msec    Systemic VTI:  0.12 m MV E velocity: 53.45 cm/s  Systemic Diam: 2.20 cm MV A velocity: 65.55 cm/s MV E/A ratio:  0.82  Norman Herrlich MD Electronically signed by Norman Herrlich MD Signature Date/Time: 10/17/2023/5:50:44 PM    Final    MONITORS  LONG TERM MONITOR (3-14 DAYS) 05/26/2021  Narrative Patch Wear Time:  8 days and 21 hours (2022-08-18T16:00:53-0400 to 2022-08-27T13:47:01-399)  Patient had a min HR of 49 bpm, max HR of 144 bpm, and avg HR of 75 bpm. Predominant underlying rhythm was Sinus Rhythm. 1 run of Supraventricular Tachycardia occurred lasting 9 beats with a max rate of 144 bpm (avg 136 bpm). Isolated SVEs were rare (<1.0%), SVE Couplets were rare (<1.0%), and SVE Triplets were rare (<1.0%). Isolated VEs were frequent (6.8%, 65429), VE Couplets were rare (<1.0%, 522), and VE Triplets were rare (<1.0%, 10). Ventricular Bigeminy and Trigeminy were present.  Summary conclusions: 1 supraventricular tachycardia at 9 beats at rate of 144, Total of 25 triggered event showing sinus rhythm and sinus tachycardia       ______________________________________________________________________________________________       EKG: NSR, HR 87 bpm  Recent Labs: 11/14/2023: ALT 20; BUN 29; Creatinine 1.60; Hemoglobin 12.8; Platelet Count 143; Potassium 4.4; Sodium 142  Recent Lipid Panel    Component Value Date/Time   CHOL 139 07/09/2020 0354   TRIG 255 (H) 07/09/2020 0354   HDL 33 (L) 07/09/2020 0354   CHOLHDL 4.2 07/09/2020 0354   VLDL 51 (H) 07/09/2020 0354   LDLCALC 55 07/09/2020 0354     Risk Assessment/Calculations:      HYPERTENSION CONTROL Vitals:   11/18/23 1401 11/18/23 1620  BP: (!) 150/90 (!) 150/90    The patient's blood pressure is elevated above target today.  In order to address the patient's elevated BP: Blood pressure will be monitored at home to determine if  medication changes need to be made.            Physical Exam:    VS:  BP (!) 150/90   Pulse 84   Ht 6\' 2"  (1.88 m)   SpO2 93%   BMI 27.67 kg/m     Wt Readings from Last 3 Encounters:  11/14/23 215 lb 8 oz (97.8 kg)  09/13/23 211 lb 4.8 oz (95.8 kg)  07/15/23 206 lb (93.4 kg)     GEN: no acute distress HEENT: Normal NECK: No JVD; No carotid bruits LYMPHATICS: No lymphadenopathy CARDIAC: RRR, no murmurs, rubs, gallops RESPIRATORY: trace rales  ABDOMEN: Soft, non-tender, non-distended MUSCULOSKELETAL:  +2 pedal edema,  No deformity  SKIN: Warm and dry NEUROLOGIC:  Alert and oriented x 3 PSYCHIATRIC:  Normal affect   ASSESSMENT:    1. Coronary artery disease involving native coronary artery of native heart without angina pectoris   2. Essential hypertension   3. Dyspnea on exertion   4. Ischemic cardiomyopathy   5. Chronic combined systolic and diastolic congestive heart failure (HCC)   6. Ascending aorta dilatation (HCC)   7. Stage 3a chronic kidney disease (HCC)      PLAN:    In order of problems listed above:  CAD-s/p DES x 1 to mid and distal cx in 2021 >> Lexiscan in June 2024 was negative for ischemia however intermediate risk secondary to decreased EF.  Continue aspirin 81 mg daily, beta-blocker was stopped secondary to fatigue and falls per PCP, continue NTG PRN-- has needed a few times.  Ischemic cardiomyopathy/HFmrEF-most recent echo on1/23/2025 echo EF 35%, moderate to severely decreased LV function, grade 1 DD. NYHA class II, rales are appreciated upon auscultation.  He is intermittently weighing and intermittently taking his Lasix.  Will repeat BMET, proBNP today.  GDMT has been prohibited secondary to propensity to falls, dizziness.  Plan to try and restart him on low-dose metoprolol succinate to take in the evening and SGLt2i.  Metastatic cholangiocarcinoma of the liver - advised recent testing revealed chest involvement, likely the driving factor behind  his fatigue.  Hypertension-blood pressure is elevated at 150/90 however, he has had episodes of hypotension in the past, along with associated falls, and is not currently on any antihypertensive agents.  Ascending aorta dilatation-moderate dilatation of the aortic root at 43 mm in January 2025, previously had MRI of his abdomen which did not reveal any aneurysm. CKD 3a -careful titration of antihypertensive agents and diuretics.  Disposition- BMET, proBNP, follow up in 4 weeks.          Medication Adjustments/Labs and Tests Ordered: Current medicines are reviewed at length with the patient today.  Concerns regarding medicines are outlined above.  Orders Placed This Encounter  Procedures   Basic metabolic panel   Pro b natriuretic peptide (BNP)   No orders of the defined types were placed in this encounter.   Patient Instructions  Medication Instructions:  Your physician recommends that you continue on your current medications as directed. Please refer to the Current Medication list given to you today.  Take your Lasix today  *If you need a refill on your cardiac medications before your next appointment, please call your pharmacy*   Lab Work: Your physician recommends that you have a BMP and ProBNP today in the office.  If you have labs (blood work) drawn today and your tests are completely normal, you will receive your results only by: MyChart Message (if you have MyChart) OR A paper copy in the mail If you have any lab test that is abnormal or we need to change your treatment, we will call you to review the results.   Testing/Procedures: None ordered   Follow-Up: At [facility], you and your health needs are our priority.  As part of our continuing mission to provide you with exceptional heart care, we have created designated Provider  Care Teams.  These Care Teams include your primary Cardiologist (physician) and Advanced Practice Providers (APPs -  Physician  Assistants and Nurse Practitioners) who all work together to provide you with the care you need, when you need it.  We recommend signing up for the patient portal called "MyChart".  Sign up information is provided on this After Visit Summary.  MyChart is used to connect with patients for Virtual Visits (Telemedicine).  Patients are able to view lab/test results, encounter notes, upcoming appointments, etc.  Non-urgent messages can be sent to your provider as well.   To learn more about what you can do with MyChart, go to [email or web address].    Your next appointment:   4 week(s)  The format for your next appointment:   In Person  Provider:   Gypsy Balsam, MD or Wallis Bamberg, NP [facility])    Other Instructions none  Important Information About Sugar        Signed, Flossie Dibble, NP  11/18/2023 4:20 PM    [facility]

## 2023-11-19 ENCOUNTER — Other Ambulatory Visit (HOSPITAL_COMMUNITY): Payer: Self-pay

## 2023-11-19 ENCOUNTER — Telehealth: Payer: Self-pay

## 2023-11-19 DIAGNOSIS — I5042 Chronic combined systolic (congestive) and diastolic (congestive) heart failure: Secondary | ICD-10-CM

## 2023-11-19 DIAGNOSIS — N1831 Chronic kidney disease, stage 3a: Secondary | ICD-10-CM

## 2023-11-19 DIAGNOSIS — E119 Type 2 diabetes mellitus without complications: Secondary | ICD-10-CM

## 2023-11-19 LAB — BASIC METABOLIC PANEL WITH GFR
BUN/Creatinine Ratio: 17 (ref 10–24)
BUN: 25 mg/dL (ref 8–27)
CO2: 23 mmol/L (ref 20–29)
Calcium: 9.1 mg/dL (ref 8.6–10.2)
Chloride: 106 mmol/L (ref 96–106)
Creatinine, Ser: 1.49 mg/dL — ABNORMAL HIGH (ref 0.76–1.27)
Glucose: 93 mg/dL (ref 70–99)
Potassium: 4.7 mmol/L (ref 3.5–5.2)
Sodium: 141 mmol/L (ref 134–144)
eGFR: 47 mL/min/1.73 — ABNORMAL LOW

## 2023-11-19 LAB — PRO B NATRIURETIC PEPTIDE: NT-Pro BNP: 848 pg/mL — ABNORMAL HIGH (ref 0–486)

## 2023-11-19 MED ORDER — METOPROLOL SUCCINATE ER 25 MG PO TB24: 12.5000 mg | ORAL_TABLET | Freq: Every evening | ORAL | 6 refills | Status: DC

## 2023-11-19 MED ORDER — EMPAGLIFLOZIN 10 MG PO TABS: 10.0000 mg | ORAL_TABLET | Freq: Every day | ORAL | 3 refills | Status: DC

## 2023-11-19 NOTE — Telephone Encounter (Signed)
-----   Message from Flossie Dibble sent at 11/19/2023  7:38 AM EST ----- Start metoprolol succinate 25 mg take half tablet in the evening.   Take lasix daily as prescribed.   Start Farxiga 10 mg daily.   Repeat BMET in 2 weeks.

## 2023-11-19 NOTE — Telephone Encounter (Signed)
 RX sent to pharmacy. RX has been changed to [place] as it is on formulary for insurance. Spoke with Wallis Bamberg, NP who is ok for medication change from [place] to [place].

## 2023-11-20 MED ORDER — EMPAGLIFLOZIN 10 MG PO TABS: 10.0000 mg | ORAL_TABLET | Freq: Every day | ORAL | 3 refills | Status: AC

## 2023-11-20 MED ORDER — METOPROLOL SUCCINATE ER 25 MG PO TB24: 12.5000 mg | ORAL_TABLET | Freq: Every evening | ORAL | 6 refills | Status: AC

## 2023-11-20 MED ORDER — FUROSEMIDE 20 MG PO TABS: 20.0000 mg | ORAL_TABLET | Freq: Every day | ORAL | 3 refills | Status: AC

## 2023-11-20 NOTE — Telephone Encounter (Signed)
 Spoke with the pt who ask that I call and speak with his son Barbara Cower. Called [facility] and discussed results and medication changes as recommended by J. Elliot Gurney, NP. Barbara Cower verbalized understanding and had no additional questions.  Per Geanie Cooley please call son Edith Lord as "I have memory problems"

## 2023-11-21 ENCOUNTER — Telehealth: Payer: Self-pay

## 2023-11-21 NOTE — Progress Notes (Signed)
 [facility] CSW Progress Note   Clinical Social Work was referred by nurse for assessment of psychosocial needs(medication assistance).  Patient received letter from "he Assistance Fund" [employer] stating expiration of coverage. CSW called phone number listed and was informed funding is no longer available. CSW contacted Pharmacy Team to determine impact of funding, CSW was informed patient's [place] is covered through [employer], therefore medication is not impacted by funding. CSW contacted patient and relayed information provided by Pharmacy Team, Patient inquried about expiration date of patient assistance. CSW uncertain and messaaged pharmacy team, will update patient with information.    Marguerita Merles, LCSW Clinical Social Worker [facility]

## 2023-11-21 NOTE — Telephone Encounter (Signed)
 Opened in error

## 2023-11-25 ENCOUNTER — Other Ambulatory Visit: Payer: Self-pay | Admitting: Hematology and Oncology

## 2023-11-25 ENCOUNTER — Telehealth: Payer: Self-pay

## 2023-11-25 ENCOUNTER — Other Ambulatory Visit: Payer: Self-pay | Admitting: Oncology

## 2023-11-25 DIAGNOSIS — C221 Intrahepatic bile duct carcinoma: Secondary | ICD-10-CM

## 2023-11-25 MED ORDER — PEMIGATINIB 9 MG PO TABS: 9.0000 mg | ORAL_TABLET | Freq: Every day | ORAL | 2 refills | Status: DC

## 2023-11-25 NOTE — Telephone Encounter (Signed)
 Pt only has 1 tab left. He has called [employer] and they told him the prescription has expired. They have been waiting on new prescription. Dr Melvyn Neth notified, he will be sending new prescription today.

## 2023-12-15 NOTE — Progress Notes (Signed)
 " Cardiology Office Note:    Date:  12/17/2023   ID:  Gary [name], DOB 02-21-1941, MRN [id number]  PCP:  Dawayne Kerney SQUIBB, MD   [facility] Providers Cardiologist:  Lamar Fitch, MD     Referring MD: Dawayne Kerney SQUIBB, MD     History of Present Illness:    Gary [name] is a 83 y.o. 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  Had been evaluated by his PCP for orthostasis and falls, his Coreg  was stopped and he was feeling somewhat better at this time.  Evaluated by Dr. Fitch on 05/17/2023, was stable from a cardiac perspective, did have some chest wall tenderness upon palpation.  No changes were made and he was advised follow-up in 6 months.  He had a repeat echo arrange for shortness of breath, revealing an EF of 35%. Evaluated by myself on 11/18/2023.  Discussed results of his most recent echocardiogram, started him on low-dose metoprolol  and Jardiance , with plans for quick follow-up.     He presents today accompanied by a friend for follow-up of his heart failure.  He has been weighing daily, tracking his weight since he was last evaluated in our office and presents a weight log, his weight today in the office is 209 which is down 6 pounds since his last visit 4 weeks ago.  He does have complaints of  good and bad days, which is likely multifactorial.  Some days he has dizziness along with shortness of breath however today he says this is a good day for him.  He denies chest pain, palpitations, pnd, orthopnea, n, v, syncope, edema, weight gain, or early satiety.    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   Chronic obstructive pulmonary disease (HCC) 12/13/2020   Chronic pain syndrome 12/13/2020   Chronic systolic heart failure (HCC) 12/13/2020   Coronary artery disease with PTCA and stenting of mid and distal circumflex artery on 07/11/2020 07/29/2020   Diastolic congestive heart failure (HCC) 04/24/2019   Dizziness 08/30/2020   Dyspnea on exertion 04/24/2019   Encounter for therapeutic drug level monitoring 06/15/2020   Essential hypertension 04/24/2019   Gastro-esophageal reflux disease without esophagitis 12/13/2020  Generalized anxiety disorder 12/13/2020   Hyperglycemia due to type 2 diabetes mellitus (HCC) 12/13/2020   Hypertension    Hypothyroidism 12/13/2020   Liver tumor 12/27/2017   Formatting of this note might be different from the original. 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   Memory loss 08/30/2020   Migraine without aura, not intractable, without status migrainosus 08/30/2020   Mild  intermittent asthma 12/13/2020   Mild major depression, single episode (HCC) 12/13/2020   Mixed hyperlipidemia 12/13/2020   NSTEMI (non-ST elevated myocardial infarction) (HCC) 07/09/2020   Orthostatic hypotension 10/20/2020   Other long term (current) drug therapy 12/13/2020   Other vitamin B12 deficiency anemias 12/13/2020   Pain in left hip 12/13/2020   Preop cardiovascular exam 06/15/2020   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/30/2020   Type 2 diabetes mellitus without complications (HCC) 12/13/2020   Unsteadiness on feet 08/30/2020   UTI (urinary tract infection) 01/08/2018   Vitamin D  deficiency 12/13/2020    Past Surgical History:  Procedure Laterality Date   BILE DUCT EXPLORATION     To remove cancer   CORONARY STENT INTERVENTION N/A 07/11/2020   Procedure: CORONARY STENT INTERVENTION;  Surgeon: Anner Alm ORN, MD;  Location: [facility] INVASIVE CV LAB;  Service: Cardiovascular;  Laterality: N/A;   LEFT HEART CATH AND CORONARY ANGIOGRAPHY N/A 07/11/2020   Procedure: LEFT HEART CATH AND CORONARY ANGIOGRAPHY;  Surgeon: Anner Alm ORN, MD;  Location: [facility] INVASIVE CV LAB;  Service: Cardiovascular;  Laterality: N/A;   MOHS SURGERY     Basil cell on nose    Current Medications: Current Meds  Medication Sig   ALPRAZolam  (XANAX ) 0.5 MG tablet Take 0.5 mg by mouth 3 (three) times daily as needed for anxiety.    aspirin  EC 81 MG tablet Take 1 tablet (81 mg total) by mouth daily. Swallow whole.   atorvastatin  (LIPITOR) 10 MG tablet Take 10 mg by mouth daily.   buPROPion  (WELLBUTRIN  XL) 300 MG 24 hr tablet Take 1 tablet by mouth daily. For 90 days   calcium  carbonate (TUMS - DOSED IN MG ELEMENTAL CALCIUM ) 500 MG chewable tablet Chew 1 tablet by mouth in the morning and at bedtime. Total of 1200 mg daily   clopidogrel (PLAVIX) 75 MG tablet Take 75 mg by mouth daily.   empagliflozin  (JARDIANCE ) 10 MG TABS tablet Take 1 tablet (10 mg total) by mouth daily.   ferrous sulfate 325 (65 FE) MG tablet Take  325 mg by mouth daily with breakfast.   folic acid  (FOLVITE ) 1 MG tablet Take 1 tablet (1 mg total) by mouth daily.   furosemide  (LASIX ) 20 MG tablet Take 1 tablet (20 mg total) by mouth daily.   HYDROcodone -acetaminophen  (NORCO) 10-325 MG tablet Take 1 tablet by mouth every 6 (six) hours as needed for moderate pain or severe pain.   ketoconazole (NIZORAL) 2 % cream Apply 1 Application topically daily.   magnesium  citrate SOLN Take 1 Bottle by mouth once. Pt to take 1/2 bottle. If no results in 1hr, take the other 1/2.   metoprolol  succinate (TOPROL  XL) 25 MG 24 hr tablet Take 0.5 tablets (12.5 mg total) by mouth every evening.   montelukast (SINGULAIR) 10 MG tablet Take 1 tablet by mouth daily.   Multiple Vitamin (MULTIVITAMIN) tablet Take 1 tablet by mouth daily. Unknown Strength per patient   nitroGLYCERIN  (NITROSTAT ) 0.4 MG SL tablet Place 1 tablet (0.4 mg total) under the tongue every 5 (five) minutes as needed for chest pain.  Omega-3 Fatty Acids (FISH OIL) 1200 MG CPDR Take 1 tablet by mouth daily.   omeprazole (PRILOSEC) 40 MG capsule Take 1 capsule by mouth daily as needed (Acid reflux).   ondansetron  (ZOFRAN ) 4 MG tablet TAKE 1 TABLET BY MOUTH EVERY 4 HOURS AS NEEDED FOR NAUSEA OR VOMITING   pemigatinib  (PEMAZYRE ) 9 MG tablet Take 1 tablet (9 mg total) by mouth daily. Take at approximately the same time each day. Take for 14 days on, 7 days off, repeat every 21d.   Plecanatide (TRULANCE) 3 MG TABS Take 3 mg by mouth 2 (two) times a week.   tamsulosin  (FLOMAX ) 0.4 MG CAPS capsule Take 0.4 mg by mouth daily.   vitamin B-12 (CYANOCOBALAMIN ) 250 MCG tablet Take 250 mcg by mouth daily.   Vitamin D , Ergocalciferol , 50 MCG (2000 UT) CAPS Take 1 tablet by mouth daily.     Allergies:   Heparin , Pork-derived products, and Amoxicillin   Social History   Socioeconomic History   Marital status: Widowed    Spouse name: Not on file   Number of children: Not on file   Years of education: Not  on file   Highest education level: Not on file  Occupational History   Not on file  Tobacco Use   Smoking status: Never   Smokeless tobacco: Current    Types: Chew  Substance and Sexual Activity   Alcohol use: Not Currently   Drug use: Never   Sexual activity: Not on file  Other Topics Concern   Not on file  Social History Narrative   Not on file   Social Drivers of Health   Financial Resource Strain: Not on file  Food Insecurity: Not on file  Transportation Needs: Not on file  Physical Activity: Not on file  Stress: Not on file  Social Connections: Not on file     Family History: The patient's family history includes Breast cancer in his sister and sister; Cancer in his father.  ROS:   Please see the history of present illness.     All other systems reviewed and are negative.  EKGs/Labs/Other Studies Reviewed:    The following studies were reviewed today: Cardiac Studies & Procedures   ______________________________________________________________________________________________ CARDIAC CATHETERIZATION  CARDIAC CATHETERIZATION 07/11/2020  Narrative  Mid Cx to Dist Cx lesion is 85% stenosed.  A drug-eluting stent was successfully placed using a STENT RESOLUTE ONYX 2.5X12. Postdilated 2.65 mm  Post intervention, there is a 0% residual stenosis.  -------------  Lida LM lesion is 25% stenosed.  Mid LM to Dist LM lesion is 25% stenosed with 60% stenosed side branch in Ost Cx to Prox Cx. This involves the ostium of Ramus and 1st Mrg  Ramus lesion is 60% stenosed. 1st Mrg lesion is 40% stenosed.  Prox LAD lesion is 60% stenosed -discrete eccentric lesion  Mid LAD lesion is 60% stenosed -> focal concentric lesion in the bend  Prox RCA lesion is 15% stenosed.  SUMMARY  Multivessel CAD:  Clear CULPRIT LESION noted as 85% ulcerated mLCx(AVG Cx), -->  Successful DES PCI reducing to 0%: Resolute Onyx DES 2.5 mm x 12 mm - post-dilated to 2.65 mm  Tifurcation  AVG Cx-RI-1stMrg with each branch ~50-60%, prox LAD focal /eccentric ~60%,  mid LAD short ~60-65% concentric lesion.  Normal LVEDP - Echo showed mildly reduced LVEF with Inferolateral WMA.   RECOMMENDATIONS  Transfer to 6E post PCI.  Continue to treat residual disease medically to allow recovery from recent surgery.   Alm Clay,  MD  Findings Coronary Findings Diagnostic  Dominance: Right  Left Main Ost LM lesion is 25% stenosed. The lesion is focal and concentric. Mid LM to Dist LM lesion is 25% stenosed with 60% stenosed side branch in Ost Cx to Prox Cx. The lesion is located at the bifurcation, focal, discrete and concentric. Essentially trifurcation almost possibly from distal left main  Left Anterior Descending Prox LAD lesion is 60% stenosed. The lesion is discrete and eccentric. Mid LAD lesion is 60% stenosed. The lesion is located at the bend, focal and concentric.  Second Diagonal Branch Vessel is small in size.  Ramus Intermedius Vessel is small. Ramus lesion is 60% stenosed. The lesion is focal.  Left Circumflex Vessel is moderate in size. Mid Cx to Dist Cx lesion is 85% stenosed. Vessel is the culprit lesion. The lesion is located proximal to the major branch, eccentric, irregular and ulcerative.  First Obtuse Marginal Branch 1st Mrg lesion is 40% stenosed. The lesion is located at the bifurcation and focal.  Second Obtuse Marginal Branch Vessel is large in size.  Right Coronary Artery Vessel was injected. Vessel is large. The vessel is moderately tortuous. Prox RCA lesion is 15% stenosed. The lesion is located at the bend.  Acute Marginal Branch Vessel is small in size. Course this is a tandem PDA  Right Ventricular Branch Vessel is small in size.  Right Posterior Descending Artery Vessel is small in size.  Right Posterior Atrioventricular Artery Vessel is small in size.  Second Right Posterolateral Branch Vessel is small in  size.  Intervention  Mid Cx to Dist Cx lesion Stent Lesion length:  10 mm. CATH VISTA GUIDE 6FR XBLAD3.5 guide catheter was inserted. Lesion crossed with guidewire using a WIRE RUNTHROUGH .[id number]. Pre-stent angioplasty was performed using a BALLOON SAPPHIRE 2.5X12. Maximum pressure:  8 atm. Inflation time:  20 sec. 2 inflations A drug-eluting stent was successfully placed using a STENT RESOLUTE ONYX 2.5X12. Unable to advance 15 mm stent Maximum pressure: 16 atm. Inflation time: 30 sec. Minimum lumen area:  2.7 mm. Stent strut is well apposed. Post-stent angioplasty was not performed. Post-Intervention Lesion Assessment The intervention was successful. Pre-interventional TIMI flow is 3. Post-intervention TIMI flow is 3. Treated lesion length:  12 mm. No complications occurred at this lesion. There is a 0% residual stenosis post intervention.   STRESS TESTS  MYOCARDIAL PERFUSION IMAGING 03/21/2023  Narrative   Findings are consistent with no ischemia and no infarction. The study is intermediate risk secondary to reduced EF.   No ST deviation was noted.   Left ventricular function is abnormal. Global function is mildly reduced. Nuclear stress EF: 41 %. The left ventricular ejection fraction is moderately decreased (30-44%). End diastolic cavity size is mildly enlarged.   Prior study available for comparison from 07/15/2019.   ECHOCARDIOGRAM  ECHOCARDIOGRAM COMPLETE 10/17/2023  Narrative ECHOCARDIOGRAM REPORT    Patient Name:   Gary [name] Date of Exam: 10/17/2023 Medical Rec #:  [id number]              Height:       74.0 in Accession #:    [id number]             Weight:       211.3 lb Date of Birth:  06-01-1941              BSA:          2.225 m Patient Age:    42 years  BP:           130/62 mmHg Patient Gender: M                      HR:           77 bpm. Exam Location:  [place]  Procedure: 2D Echo, Cardiac Doppler, Color Doppler and Strain  Analysis  Indications:    Dyspnea on exertion [R06.09]  History:        Patient has prior history of Echocardiogram examinations. CHF, Previous Myocardial Infarction and CAD; Risk Factors:Hypertension and Dyslipidemia.  Sonographer:    Charlie Jointer RDCS Referring Phys: [id number] LAMAR JINNY FITCH   Sonographer Comments: Suboptimal subcostal window and suboptimal parasternal window. IMPRESSIONS   1. Left ventricular ejection fraction, by estimation, is 35%%. The left ventricle has moderate to severely decreased function. Left ventricular endocardial border not optimally defined to evaluate regional wall motion. The left ventricular internal cavity size was mildly dilated. Left ventricular diastolic parameters are consistent with Grade I diastolic dysfunction (impaired relaxation). 2. Right ventricular systolic function was not well visualized. The right ventricular size is normal. 3. Left atrial size was mild to moderately dilated. 4. The mitral valve is normal in structure. No evidence of mitral valve regurgitation. No evidence of mitral stenosis. 5. The aortic valve is tricuspid. Aortic valve regurgitation is not visualized. No aortic stenosis is present. 6. Aortic DTA is NWV. There is moderate dilatation of the aortic root, measuring 43 mm. 7. The inferior vena cava is normal in size with greater than 50% respiratory variability, suggesting right atrial pressure of 3 mmHg.  FINDINGS Left Ventricle: Left ventricular ejection fraction, by estimation, is 35%%. The left ventricle has moderate to severely decreased function. Left ventricular endocardial border not optimally defined to evaluate regional wall motion. Global longitudinal strain performed but not reported based on interpreter judgement due to suboptimal tracking. The left ventricular internal cavity size was mildly dilated. There is no left ventricular hypertrophy. Left ventricular diastolic parameters are consistent  with Grade I diastolic dysfunction (impaired relaxation).  Right Ventricle: The right ventricular size is normal. Right vetricular wall thickness was not well visualized. Right ventricular systolic function was not well visualized.  Left Atrium: Left atrial size was mild to moderately dilated.  Right Atrium: Right atrial size was not well visualized.  Pericardium: There is no evidence of pericardial effusion.  Mitral Valve: The mitral valve is normal in structure. No evidence of mitral valve regurgitation. No evidence of mitral valve stenosis.  Tricuspid Valve: The tricuspid valve is normal in structure. Tricuspid valve regurgitation is not demonstrated. No evidence of tricuspid stenosis.  Aortic Valve: The aortic valve is tricuspid. Aortic valve regurgitation is not visualized. No aortic stenosis is present.  Pulmonic Valve: The pulmonic valve was not well visualized. Pulmonic valve regurgitation is not visualized. No evidence of pulmonic stenosis.  Aorta: The aortic root is normal in size and structure, the ascending aorta was not well visualized, the aortic arch was not well visualized and DTA is NWV. There is moderate dilatation of the aortic root, measuring 43 mm.  Venous: The pulmonary veins were not well visualized. The inferior vena cava was not well visualized. The inferior vena cava is normal in size with greater than 50% respiratory variability, suggesting right atrial pressure of 3 mmHg.  IAS/Shunts: No atrial level shunt detected by color flow Doppler.   LEFT VENTRICLE PLAX 2D LVIDd:         6.20 cm  Diastology LVIDs:         5.20 cm   LV e' medial:    5.11 cm/s LV PW:         1.00 cm   LV E/e' medial:  10.5 LV IVS:        1.00 cm   LV e' lateral:   7.51 cm/s LVOT diam:     2.20 cm   LV E/e' lateral: 7.1 LV SV:         47 LV SV Index:   21 LVOT Area:     3.80 cm   RIGHT VENTRICLE RV Basal diam:  3.90 cm RV Mid diam:    3.30 cm RV S prime:     10.90 cm/s TAPSE  (M-mode): 2.0 cm  LEFT ATRIUM              Index        RIGHT ATRIUM           Index LA diam:        3.40 cm  1.53 cm/m   RA Area:     12.20 cm LA Vol (A2C):   106.0 ml 47.64 ml/m  RA Volume:   26.80 ml  12.05 ml/m LA Vol (A4C):   81.0 ml  36.41 ml/m LA Biplane Vol: 94.2 ml  42.34 ml/m AORTIC VALVE LVOT Vmax:   64.30 cm/s LVOT Vmean:  42.267 cm/s LVOT VTI:    0.122 m  AORTA Ao Root diam: 4.30 cm Ao Asc diam:  4.10 cm  MITRAL VALVE MV Area (PHT): 3.75 cm    SHUNTS MV Decel Time: 203 msec    Systemic VTI:  0.12 m MV E velocity: 53.45 cm/s  Systemic Diam: 2.20 cm MV A velocity: 65.55 cm/s MV E/A ratio:  0.82  Redell Leiter MD Electronically signed by Redell Leiter MD Signature Date/Time: 10/17/2023/5:50:44 PM    Final    MONITORS  LONG TERM MONITOR (3-14 DAYS) 05/26/2021  Narrative Patch Wear Time:  8 days and 21 hours (2022-08-18T16:00:53-0400 to 2022-08-27T13:47:01-399)  Patient had a min HR of 49 bpm, max HR of 144 bpm, and avg HR of 75 bpm. Predominant underlying rhythm was Sinus Rhythm. 1 run of Supraventricular Tachycardia occurred lasting 9 beats with a max rate of 144 bpm (avg 136 bpm). Isolated SVEs were rare (<1.0%), SVE Couplets were rare (<1.0%), and SVE Triplets were rare (<1.0%). Isolated VEs were frequent (6.8%, 65429), VE Couplets were rare (<1.0%, 522), and VE Triplets were rare (<1.0%, 10). Ventricular Bigeminy and Trigeminy were present.  Summary conclusions: 1 supraventricular tachycardia at 9 beats at rate of 144, Total of 25 triggered event showing sinus rhythm and sinus tachycardia       ______________________________________________________________________________________________       EKG: NSR, HR 87 bpm  Recent Labs: 11/14/2023: ALT 20; Hemoglobin 12.8; Platelet Count 143 11/18/2023: BUN 25; Creatinine, Ser 1.49; NT-Pro BNP 848; Potassium 4.7; Sodium 141  Recent Lipid Panel    Component Value Date/Time   CHOL 139 07/09/2020 0354    TRIG 255 (H) 07/09/2020 0354   HDL 33 (L) 07/09/2020 0354   CHOLHDL 4.2 07/09/2020 0354   VLDL 51 (H) 07/09/2020 0354   LDLCALC 55 07/09/2020 0354     Risk Assessment/Calculations:                Physical Exam:    VS:  BP 110/70   Pulse 67   Ht 6' 2 (1.88 m)   Wt 209 lb (94.8  kg)   SpO2 96%   BMI 26.83 kg/m     Wt Readings from Last 3 Encounters:  12/16/23 209 lb (94.8 kg)  11/14/23 215 lb 8 oz (97.8 kg)  09/13/23 211 lb 4.8 oz (95.8 kg)     GEN: no acute distress HEENT: Normal NECK: No JVD; No carotid bruits LYMPHATICS: No lymphadenopathy CARDIAC: RRR, no murmurs, rubs, gallops RESPIRATORY: trace rales  ABDOMEN: Soft, non-tender, non-distended MUSCULOSKELETAL:  no pedal edema,  No deformity  SKIN: Warm and dry NEUROLOGIC:  Alert and oriented x 3 PSYCHIATRIC:  Normal affect   ASSESSMENT:    1. Coronary artery disease involving native coronary artery of native heart without angina pectoris   2. HFrEF (heart failure with reduced ejection fraction) (HCC)   3. Ascending aorta dilatation (HCC)   4. 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 Essential hypertension       PLAN:    In order of problems listed above:  CAD-s/p DES x 1 to mid and distal cx in 2021 >> Lexiscan  in June 2024 was negative for ischemia however intermediate risk secondary to decreased EF.  Continue aspirin  81 mg daily, continue low-dose metoprolol  succinate 12.5 mg at bedtime, continue NTG PRN-- has needed a few times.   Ischemic cardiomyopathy/HFmrEF-most recent echo on1/23/2025 echo EF 35%, moderate to severely decreased LV function, grade 1 DD. NYHA class II, trace rales are appreciated upon auscultation. Weighing daily, wt log reveals loss of 6 lbs since last OV when we started Jardiance  and BB.  Most recent potassium 4.7 so MRA not indicated. Further GDMT has been prohibited secondary to propensity to falls, dizziness, and intermittent hypotension.   Metastatic  cholangiocarcinoma of the liver - advised recent testing revealed chest involvement, likely the driving factor behind his fatigue. Follows with Dr. Ezzard.   Hypertension-blood pressure is well-controlled at 110/70, he has had episodes of hypotension and orthostasis in the past.  Continue metoprolol  12.5 mg in the evening.  Ascending aorta dilatation-moderate dilatation of the aortic root at 43 mm in January 2025, previously had MRI of his abdomen which did not reveal any aneurysm.  CKD 3a -careful titration of antihypertensive agents and diuretics.  Disposition- Follow up in 2 months with Dr. Krasowski.          Medication Adjustments/Labs and Tests Ordered: Current medicines are reviewed at length with the patient today.  Concerns regarding medicines are outlined above.  No orders of the defined types were placed in this encounter.  No orders of the defined types were placed in this encounter.   Patient Instructions  Medication Instructions:   For the next two days, take an additional 20 mg of Lasix .   On Thursday, please call our office to let us  know how you are feeling.    *If you need a refill on your cardiac medications before your next appointment, please call your pharmacy*   Lab Work: None Ordered If you have labs (blood work) drawn today and your tests are completely normal, you will receive your results only by: MyChart Message (if you have MyChart) OR A paper copy in the mail If you have any lab test that is abnormal or we need to change your treatment, we will call you to review the results.   Testing/Procedures: None Ordered   Follow-Up: At [facility], you and your health needs are our priority.  As part of our continuing mission to provide you with exceptional heart care, we have created designated  Provider Care Teams.  These Care Teams include your primary Cardiologist (physician) and Advanced Practice Providers (APPs -  Physician Assistants and Nurse  Practitioners) who all work together to provide you with the care you need, when you need it.  We recommend signing up for the patient portal called MyChart.  Sign up information is provided on this After Visit Summary.  MyChart is used to connect with patients for Virtual Visits (Telemedicine).  Patients are able to view lab/test results, encounter notes, upcoming appointments, etc.  Non-urgent messages can be sent to your provider as well.   To learn more about what you can do with MyChart, go to [email or web address].    Your next appointment:   6-8 week follow up     Signed, Delon JAYSON Hoover, NP  12/17/2023 10:37 AM    [facility] "

## 2023-12-16 ENCOUNTER — Encounter: Payer: Self-pay | Admitting: Cardiology

## 2023-12-16 ENCOUNTER — Encounter: Payer: Self-pay | Admitting: *Deleted

## 2023-12-16 ENCOUNTER — Ambulatory Visit: Payer: Medicare Other | Attending: Cardiology | Admitting: Cardiology

## 2023-12-16 VITALS — BP 110/70 | HR 67 | Ht 74.0 in | Wt 209.0 lb

## 2023-12-16 DIAGNOSIS — I2510 Atherosclerotic heart disease of native coronary artery without angina pectoris: Secondary | ICD-10-CM | POA: Diagnosis present

## 2023-12-16 DIAGNOSIS — I77810 Thoracic aortic ectasia: Secondary | ICD-10-CM

## 2023-12-16 DIAGNOSIS — I5020 Unspecified systolic (congestive) heart failure: Secondary | ICD-10-CM

## 2023-12-16 DIAGNOSIS — C240 Malignant neoplasm of extrahepatic bile duct: Secondary | ICD-10-CM | POA: Diagnosis present

## 2023-12-16 DIAGNOSIS — I10 Essential (primary) hypertension: Secondary | ICD-10-CM | POA: Diagnosis present

## 2023-12-16 DIAGNOSIS — K5900 Constipation, unspecified: Secondary | ICD-10-CM | POA: Insufficient documentation

## 2023-12-16 NOTE — Patient Instructions (Addendum)
 Medication Instructions:   For the next two days, take an additional 20 mg of Lasix.   On Thursday, please call our office to let us know how you are feeling.    *If you need a refill on your cardiac medications before your next appointment, please call your pharmacy*   Lab Work: None Ordered If you have labs (blood work) drawn today and your tests are completely normal, you will receive your results only by: MyChart Message (if you have MyChart) OR A paper copy in the mail If you have any lab test that is abnormal or we need to change your treatment, we will call you to review the results.   Testing/Procedures: None Ordered   Follow-Up: At [facility], you and your health needs are our priority.  As part of our continuing mission to provide you with exceptional heart care, we have created designated Provider Care Teams.  These Care Teams include your primary Cardiologist (physician) and Advanced Practice Providers (APPs -  Physician Assistants and Nurse Practitioners) who all work together to provide you with the care you need, when you need it.  We recommend signing up for the patient portal called "MyChart".  Sign up information is provided on this After Visit Summary.  MyChart is used to connect with patients for Virtual Visits (Telemedicine).  Patients are able to view lab/test results, encounter notes, upcoming appointments, etc.  Non-urgent messages can be sent to your provider as well.   To learn more about what you can do with MyChart, go to [email or web address].    Your next appointment:   6-8 week follow up

## 2024-01-05 ENCOUNTER — Ambulatory Visit (HOSPITAL_BASED_OUTPATIENT_CLINIC_OR_DEPARTMENT_OTHER)

## 2024-01-08 ENCOUNTER — Other Ambulatory Visit: Payer: Self-pay

## 2024-01-08 ENCOUNTER — Ambulatory Visit (HOSPITAL_BASED_OUTPATIENT_CLINIC_OR_DEPARTMENT_OTHER)
Admission: RE | Admit: 2024-01-08 | Discharge: 2024-01-08 | Disposition: A | Discharge status: Home / Self Care | Source: Ambulatory Visit | Attending: Oncology | Admitting: Oncology

## 2024-01-08 DIAGNOSIS — C221 Intrahepatic bile duct carcinoma: Secondary | ICD-10-CM | POA: Diagnosis present

## 2024-01-08 MED ORDER — GADOBUTROL 1 MMOL/ML IV SOLN
10.0000 mL | Freq: Once | INTRAVENOUS | Status: AC | PRN
  Administered 2024-01-08: 10 mL via INTRAVENOUS

## 2024-01-08 MED ORDER — PEMIGATINIB 9 MG PO TABS: 9.0000 mg | ORAL_TABLET | Freq: Every day | ORAL | 2 refills | Status: DC

## 2024-01-13 NOTE — Progress Notes (Unsigned)
 [facility]  [address] [place],  [place]  [address] [phone]  Clinic Day:  01/14/2024  Referring physician: Georgean Kindle, MD   HISTORY OF PRESENT ILLNESS:  The patient is a 83 y.o. 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  Since May 2023, he has been on pemigatinib  for his disease management.  Abdominal MRIs have shown his disease to be under ideal control since his pemigatinib  was started.  He comes in today to review his most recent abdominal MRI.  Since his last visit, the patient has been doing fairly well.  With respect to his cholangiocarcinoma, he denies having any new symptoms or findings which concern him for disease progression.  He also denies having any vision problems as a pertains to his pemigatinib  therapy.    With respect to his cholangiocarcinoma history, the patient did undergo a segmental resection of his liver in April 2019, whose pathology revealed a 4 cm intrahepatic cholangiocarcinoma.  He also had a subcutaneous nodule develop around his umbilicus, which was biopsy-proven to be metastatic cholangiocarcinoma in July 2021.  Due to scans showing other sites of metastatic disease within his peritoneum, he was initially placed on cisplatin /gemcitabine , for which he received 8 cycles.  He continued on single agent gemcitabine  for an additional 8 cycles until CT scans showed disease progression, that was proven per a subcutaneous nodule biopsy in April 2023.  This led to him being placed on pemigatinib  in May 2023, for which he continues to take.    PHYSICAL EXAM:  Blood pressure 121/62, pulse 67, temperature 98.3 F (36.8 C), temperature source Oral, resp. rate 16, height 6\' 2"  (1.88 m), weight 208 lb 1.6 oz (94.4 kg), SpO2 93%. Wt Readings from Last 3 Encounters:  01/14/24 208 lb 1.6 oz (94.4 kg)  12/16/23 209 lb  (94.8 kg)  11/14/23 215 lb 8 oz (97.8 kg)   Body mass index is 26.72 kg/m. Performance status (ECOG): 1 - Symptomatic but completely ambulatory Physical Exam Constitutional:      Appearance: Normal appearance. He is not ill-appearing.  HENT:     Mouth/Throat:     Mouth: Mucous membranes are moist.     Pharynx: Oropharynx is clear. No oropharyngeal exudate or posterior oropharyngeal erythema.  Cardiovascular:     Rate and Rhythm: Normal rate and regular rhythm.     Heart sounds: No murmur heard.    No friction rub. No gallop.  Pulmonary:     Effort: Pulmonary effort is normal. No respiratory distress.     Breath sounds: Normal breath sounds. No wheezing, rhonchi or rales.  Abdominal:     General: Bowel sounds are normal. There is no distension.     Palpations: Abdomen is soft. There is no mass.     Tenderness: There is no abdominal tenderness.  Musculoskeletal:        General: No swelling.     Right lower leg: No edema.     Left lower leg: No edema.  Lymphadenopathy:     Cervical: No cervical adenopathy.     Upper Body:     Right upper body: No supraclavicular or axillary adenopathy.     Left upper body: No supraclavicular or axillary adenopathy.     Lower Body: No right inguinal adenopathy. No left inguinal adenopathy.  Skin:    General: Skin is warm.     Coloration: Skin is not  jaundiced.     Findings: No lesion or rash.  Neurological:     General: No focal deficit present.     Mental Status: He is alert and oriented to person, place, and time. Mental status is at baseline.  Psychiatric:        Mood and Affect: Mood normal.        Behavior: Behavior normal.        Thought Content: Thought content normal.   SCANS:  The MRI of his abdomen from 01-08-24 revealed the following: FINDINGS: Lower chest: Trace right pleural effusion. Peripheral predominant reticular signal abnormalities in the lung bases.   Hepatobiliary: Prior left hepatectomy. No bile duct  dilation. Cholecystectomy.   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 No main ductal dilation, mass lesion, or abnormal enhancement.   Spleen:  Within normal limits in size and appearance.   Adrenals/Urinary Tract: No adrenal nodules. No suspicious renal masses identified. No evidence of hydronephrosis. Multifocal subcentimeter intrinsically T1 hyperintense foci within the left kidney, likely hemorrhagic/proteinaceous cysts. No specific follow-up imaging recommended.   Stomach/Bowel: Visualized portions within the abdomen are unremarkable.   Vascular/Lymphatic: No pathologically enlarged lymph nodes identified. No abdominal aortic aneurysm demonstrated.   Other: Left paracolic peritoneal masses are slightly decreased in size measuring 3.1 cm (3:22), previously 3.5 cm and 2.5 cm (3:18), previously 2.7 cm. Partially imaged inferior midline peritoneal mass measures 6.9 cm (3:26) and is not substantially changed in size.   Musculoskeletal: No suspicious bone lesions identified. Postsurgical changes of the anterior abdominal wall. Previously noted subcutaneous lipomatous lesion in the left hip is not included within the field of view.   IMPRESSION: 1. Slight interval decrease in size of left paracolic peritoneal masses. Partially imaged inferior midline peritoneal mass is not substantially changed in size. 2. No new sites of metastatic disease in the abdomen. 3. Trace right pleural effusion. 4. Peripheral predominant reticular signal abnormalities in the lung bases, which may reflect pulmonary fibrosis. 5. Previously noted subcutaneous lipomatous lesion in the left hip is not included within the field of view.  LABS:      Latest Ref Rng & Units 01/14/2024    1:54 PM 11/14/2023    9:33 AM 09/13/2023    1:55 PM  CBC  WBC  4.0 - 10.5 K/uL 9.3  8.9  7.5   Hemoglobin 13.0 - 17.0 g/dL 16.1  09.6  04.5   Hematocrit 39.0 - 52.0 % 39.2  40.0  35.2   Platelets 150 - 400 K/uL 121  143  133       Latest Ref Rng & Units 01/14/2024    1:54 PM 11/18/2023    2:46 PM 11/14/2023    9:33 AM  CMP  Glucose 70 - 99 mg/dL 409  93  73   BUN 8 - 23 mg/dL 46  25  29   Creatinine 0.61 - 1.24 mg/dL 8.11  9.14  7.82   Sodium 135 - 145 mmol/L 142  141  142   Potassium 3.5 - 5.1 mmol/L 4.5  4.7  4.4   Chloride 98 - 111 mmol/L 106  106  105   CO2 22 - 32 mmol/L 25  23  28    Calcium  8.9 - 10.3 mg/dL 9.4  9.1  9.4   Total Protein 6.5 - 8.1 g/dL 6.9   7.1   Total Bilirubin 0.0 - 1.2 mg/dL 0.7  0.6   Alkaline Phos 38 - 126 U/L 67   70   AST 15 - 41 U/L 26   33   ALT 0 - 44 U/L 17   20     ASSESSMENT & PLAN:  Assessment/Plan:  An 83 y.o. male with known metastatic FGFR2 mutation positive cholangiocarcinoma.  In clinic today, I went over his abdominal MRI with this gentleman, for which she could see that his disease remains under ideal control.  I remain very pleased with how well his pemigatinib  oral therapy has done for his disease over the past 2 years.  Clinically, the patient appears to be doing fairly well.  Of note, this gentleman was orthostatic for a systolic blood pressure today.  Based upon this, he will receive 1.5 L of normal saline.  Otherwise, he will continue to take oral pemigatinib  at 9 mg daily, every 2 out of 3 weeks.  I will see him back in 2 months for repeat clinical assessment.  The patient understands all the plans discussed today and is in agreement with them.    Hamed Debella Felicia Horde, MD

## 2024-01-14 ENCOUNTER — Other Ambulatory Visit: Payer: Self-pay | Admitting: Oncology

## 2024-01-14 ENCOUNTER — Inpatient Hospital Stay

## 2024-01-14 ENCOUNTER — Telehealth: Payer: Self-pay | Admitting: Oncology

## 2024-01-14 ENCOUNTER — Inpatient Hospital Stay: Attending: Oncology | Admitting: Oncology

## 2024-01-14 ENCOUNTER — Encounter: Payer: Self-pay | Admitting: Oncology

## 2024-01-14 VITALS — BP 124/67 | HR 68 | Resp 18

## 2024-01-14 VITALS — BP 121/62 | HR 67 | Temp 98.3°F | Resp 16 | Ht 74.0 in | Wt 208.1 lb

## 2024-01-14 DIAGNOSIS — J90 Pleural effusion, not elsewhere classified: Secondary | ICD-10-CM | POA: Diagnosis not present

## 2024-01-14 DIAGNOSIS — C221 Intrahepatic bile duct carcinoma: Secondary | ICD-10-CM | POA: Diagnosis present

## 2024-01-14 DIAGNOSIS — D6481 Anemia due to antineoplastic chemotherapy: Secondary | ICD-10-CM

## 2024-01-14 DIAGNOSIS — C240 Malignant neoplasm of extrahepatic bile duct: Secondary | ICD-10-CM

## 2024-01-14 DIAGNOSIS — C786 Secondary malignant neoplasm of retroperitoneum and peritoneum: Secondary | ICD-10-CM | POA: Diagnosis not present

## 2024-01-14 LAB — CMP (CANCER CENTER ONLY)
ALT: 17 U/L (ref 0–44)
AST: 26 U/L (ref 15–41)
Albumin: 4 g/dL (ref 3.5–5.0)
Alkaline Phosphatase: 67 U/L (ref 38–126)
Anion gap: 11 (ref 5–15)
BUN: 46 mg/dL — ABNORMAL HIGH (ref 8–23)
CO2: 25 mmol/L (ref 22–32)
Calcium: 9.4 mg/dL (ref 8.9–10.3)
Chloride: 106 mmol/L (ref 98–111)
Creatinine: 2.04 mg/dL — ABNORMAL HIGH (ref 0.61–1.24)
GFR, Estimated: 32 mL/min — ABNORMAL LOW (ref >60)
Glucose, Bld: 147 mg/dL — ABNORMAL HIGH (ref 70–99)
Potassium: 4.5 mmol/L (ref 3.5–5.1)
Sodium: 142 mmol/L (ref 135–145)
Total Bilirubin: 0.7 mg/dL (ref 0.0–1.2)
Total Protein: 6.9 g/dL (ref 6.5–8.1)

## 2024-01-14 LAB — CBC WITH DIFFERENTIAL (CANCER CENTER ONLY)
Abs Immature Granulocytes: 0.05 10*3/uL (ref 0.00–0.07)
Basophils Absolute: 0 10*3/uL (ref 0.0–0.1)
Basophils Relative: 0 %
Eosinophils Absolute: 0.2 10*3/uL (ref 0.0–0.5)
Eosinophils Relative: 2 %
HCT: 39.2 % (ref 39.0–52.0)
Hemoglobin: 12.7 g/dL — ABNORMAL LOW (ref 13.0–17.0)
Immature Granulocytes: 1 %
Immature Platelet Fraction: 5.9 % (ref 1.2–8.6)
Lymphocytes Relative: 18 %
Lymphs Abs: 1.7 10*3/uL (ref 0.7–4.0)
MCH: 30.2 pg (ref 26.0–34.0)
MCHC: 32.4 g/dL (ref 30.0–36.0)
MCV: 93.3 fL (ref 80.0–100.0)
Monocytes Absolute: 0.7 10*3/uL (ref 0.1–1.0)
Monocytes Relative: 7 %
Neutro Abs: 6.7 10*3/uL (ref 1.7–7.7)
Neutrophils Relative %: 72 %
Platelet Count: 121 10*3/uL — ABNORMAL LOW (ref 150–400)
RBC: 4.2 MIL/uL — ABNORMAL LOW (ref 4.22–5.81)
RDW: 13 % (ref 11.5–15.5)
WBC Count: 9.3 10*3/uL (ref 4.0–10.5)
nRBC: 0 % (ref 0.0–0.2)
nRBC: 0 /100{WBCs}

## 2024-01-14 MED ORDER — SODIUM CHLORIDE 0.9 % IV SOLN: Freq: Once | INTRAVENOUS | Status: AC

## 2024-01-14 MED ORDER — SODIUM CHLORIDE 0.9% FLUSH
10.0000 mL | Freq: Once | INTRAVENOUS | Status: AC | PRN
  Administered 2024-01-14: 10 mL

## 2024-01-14 MED ORDER — SODIUM CHLORIDE 0.9% FLUSH: 3.0000 mL | Freq: Once | INTRAVENOUS | Status: DC | PRN

## 2024-01-14 NOTE — Telephone Encounter (Signed)
 Patient has been scheduled for follow-up visit per 01/14/24 LOS.  Pt given an appt calendar with date and time.

## 2024-01-14 NOTE — Patient Instructions (Signed)
 Dehydration, Adult Dehydration is a condition in which there is not enough water or other fluids in the body. This happens when a person loses more fluids than they take in. Important organs cannot work right without the right amount of fluids. Any loss of fluids from the body can cause dehydration. Dehydration can be mild, worse, or very bad. It should be treated right away to keep it from getting very bad. What are the causes? Conditions that cause loss of water in the body. They include: Watery poop (diarrhea). Vomiting. Sweating a lot. Fever. Infection. Peeing (urinating) a lot. Not drinking enough fluids. Certain medicines, such as medicines that take extra fluid out of the body (diuretics). Lack of safe drinking water. Not being able to get enough water and food. What increases the risk? Having a long-term (chronic) illness that has not been treated the right way, such as: Diabetes. Heart disease. Kidney disease. Being 25 years of age or older. Having a disability. Living in a place that is high above the ground or sea (high in altitude). The thinner, drier air causes more fluid loss. Doing exercises that put stress on your body for a long time. Being active when in hot places. What are the signs or symptoms? Symptoms of dehydration depend on how bad it is. Mild or worse dehydration Thirst. Dry lips or dry mouth. Feeling dizzy or light-headed. Muscle cramps. Passing little pee or dark pee. Pee may be the color of tea. Headache. Very bad dehydration Changes in skin. Skin may: Be cold to the touch (clammy). Be blotchy or pale. Not go back to normal right after you pinch it and let it go. Little or no tears, pee, or sweat. Fast breathing. Low blood pressure. Weak pulse. Pulse that is more than 100 beats a minute when you are sitting still. Other changes, such as: Feeling very thirsty. Eyes that look hollow (sunken). Cold hands and feet. Being confused. Being very  tired (lethargic) or having trouble waking from sleep. Losing weight. Loss of consciousness. How is this treated? Treatment for this condition depends on how bad your dehydration is. Treatment should start right away. Do not wait until your condition gets very bad. Very bad dehydration is an emergency. You will need to go to a hospital. Mild or worse dehydration can be treated at home. You may be asked to: Drink more fluids. Drink an oral rehydration solution (ORS). This drink gives you the right amount of fluids, salts, and minerals (electrolytes). Very bad dehydration can be treated: With fluids through an IV tube. By correcting low levels of electrolytes in the body. By treating the problem that caused your dehydration. Follow these instructions at home: Oral rehydration solution If told by your doctor, drink an ORS: Make an ORS. Use instructions on the package. Start by drinking small amounts, about  cup (120 mL) every 5-10 minutes. Slowly drink more until you have had the amount that your doctor said to have.  Eating and drinking  Drink enough clear fluid to keep your pee pale yellow. If you were told to drink an ORS, finish the ORS first. Then, start slowly drinking other clear fluids. Drink fluids such as: Water. Do not drink only water. Doing that can make the salt (sodium) level in your body get too low. Water from ice chips you suck on. Fruit juice that you have added water to (diluted). Low-calorie sports drinks. Eat foods that have the right amounts of salts and minerals, such as bananas, oranges, potatoes,  tomatoes, or spinach. Do not drink alcohol. Avoid drinks that have caffeine or sugar. These include:: High-calorie sports drinks. Fruit juice that you did not add water to. Soda. Coffee or energy drinks. Avoid foods that are greasy or have a lot of fat or sugar. General instructions Take over-the-counter and prescription medicines only as told by your doctor. Do  not take sodium tablets. Doing that can make the salt level in your body get too high. Return to your normal activities as told by your doctor. Ask your doctor what activities are safe for you. Keep all follow-up visits. Your doctor may check and change your treatment. Contact a doctor if: You have pain in your belly (abdomen) and the pain: Gets worse. Stays in one place. You have a rash. You have a stiff neck. You get angry or annoyed more easily than normal. You are more tired or have a harder time waking than normal. You feel weak or dizzy. You feel very thirsty. Get help right away if: You have any symptoms of very bad dehydration. You vomit every time you eat or drink. Your vomiting gets worse, does not go away, or you vomit blood or green stuff. You are getting treatment, but symptoms are getting worse. You have a fever. You have a very bad headache. You have: Diarrhea that gets worse or does not go away. Blood in your poop (stool). This may cause poop to look black and tarry. No pee in 6-8 hours. Only a small amount of pee in 6-8 hours, and the pee is very dark. You have trouble breathing. These symptoms may be an emergency. Get help right away. Call 911. Do not wait to see if the symptoms will go away. Do not drive yourself to the hospital. This information is not intended to replace advice given to you by your health care provider. Make sure you discuss any questions you have with your health care provider. Document Revised: 04/09/2022 Document Reviewed: 04/09/2022 Elsevier Patient Education  2024 [employer].

## 2024-01-16 ENCOUNTER — Encounter: Payer: Self-pay | Admitting: Oncology

## 2024-01-29 ENCOUNTER — Ambulatory Visit: Admitting: Cardiology

## 2024-03-04 ENCOUNTER — Other Ambulatory Visit: Payer: Self-pay

## 2024-03-04 DIAGNOSIS — C221 Intrahepatic bile duct carcinoma: Secondary | ICD-10-CM

## 2024-03-04 MED ORDER — PEMIGATINIB 9 MG PO TABS: 9.0000 mg | ORAL_TABLET | Freq: Every day | ORAL | 2 refills | Status: AC

## 2024-04-07 ENCOUNTER — Ambulatory Visit: Admitting: Cardiology

## 2024-04-14 ENCOUNTER — Inpatient Hospital Stay: Attending: Oncology | Admitting: Oncology

## 2024-04-14 ENCOUNTER — Other Ambulatory Visit: Payer: Self-pay | Admitting: Oncology

## 2024-04-14 ENCOUNTER — Other Ambulatory Visit: Payer: Self-pay

## 2024-04-14 ENCOUNTER — Inpatient Hospital Stay

## 2024-04-14 VITALS — BP 121/75 | HR 58 | Temp 98.3°F | Resp 20 | Ht 74.0 in | Wt 220.0 lb

## 2024-04-14 DIAGNOSIS — C786 Secondary malignant neoplasm of retroperitoneum and peritoneum: Secondary | ICD-10-CM | POA: Insufficient documentation

## 2024-04-14 DIAGNOSIS — C240 Malignant neoplasm of extrahepatic bile duct: Secondary | ICD-10-CM

## 2024-04-14 DIAGNOSIS — C221 Intrahepatic bile duct carcinoma: Secondary | ICD-10-CM | POA: Diagnosis present

## 2024-04-14 LAB — CBC WITH DIFFERENTIAL (CANCER CENTER ONLY)
Abs Immature Granulocytes: 0.06 K/uL (ref 0.00–0.07)
Basophils Absolute: 0.1 K/uL (ref 0.0–0.1)
Basophils Relative: 1 %
Eosinophils Absolute: 0.2 K/uL (ref 0.0–0.5)
Eosinophils Relative: 2 %
HCT: 40.1 % (ref 39.0–52.0)
Hemoglobin: 12.7 g/dL — ABNORMAL LOW (ref 13.0–17.0)
Immature Granulocytes: 1 %
Immature Platelet Fraction: 4.4 % (ref 1.2–8.6)
Lymphocytes Relative: 21 %
Lymphs Abs: 1.8 K/uL (ref 0.7–4.0)
MCH: 30.3 pg (ref 26.0–34.0)
MCHC: 31.7 g/dL (ref 30.0–36.0)
MCV: 95.7 fL (ref 80.0–100.0)
Monocytes Absolute: 0.9 K/uL (ref 0.1–1.0)
Monocytes Relative: 11 %
Neutro Abs: 5.6 K/uL (ref 1.7–7.7)
Neutrophils Relative %: 64 %
Platelet Count: 131 K/uL — ABNORMAL LOW (ref 150–400)
RBC: 4.19 MIL/uL — ABNORMAL LOW (ref 4.22–5.81)
RDW: 13.4 % (ref 11.5–15.5)
WBC Count: 8.5 K/uL (ref 4.0–10.5)
nRBC: 0 % (ref 0.0–0.2)

## 2024-04-14 LAB — CMP (CANCER CENTER ONLY)
ALT: 16 U/L (ref 0–44)
AST: 27 U/L (ref 15–41)
Albumin: 3.9 g/dL (ref 3.5–5.0)
Alkaline Phosphatase: 71 U/L (ref 38–126)
Anion gap: 10 (ref 5–15)
BUN: 37 mg/dL — ABNORMAL HIGH (ref 8–23)
CO2: 27 mmol/L (ref 22–32)
Calcium: 9.7 mg/dL (ref 8.9–10.3)
Chloride: 104 mmol/L (ref 98–111)
Creatinine: 1.99 mg/dL — ABNORMAL HIGH (ref 0.61–1.24)
GFR, Estimated: 33 mL/min — ABNORMAL LOW (ref >60)
Glucose, Bld: 94 mg/dL (ref 70–99)
Potassium: 4.6 mmol/L (ref 3.5–5.1)
Sodium: 141 mmol/L (ref 135–145)
Total Bilirubin: 0.5 mg/dL (ref 0.0–1.2)
Total Protein: 7.4 g/dL (ref 6.5–8.1)

## 2024-04-14 NOTE — Progress Notes (Signed)
 [facility]  [address] [place],  [place]  [address] [phone]  Clinic Day:  04/14/2024  Referring physician: Pia Kerney SQUIBB, MD   HISTORY OF PRESENT ILLNESS:  The patient is a 83 y.o. 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  Since May 2023, he has been on pemigatinib  for his disease management.  Abdominal MRIs have shown his disease to be under ideal control since his pemigatinib  was started.  He comes in today to review his most recent abdominal MRI.  Since his last visit, the patient has been doing fairly well.  He has occasional balance issues and weakness, which have both been chronic in nature.  With respect to his cholangiocarcinoma, he denies having any new symptoms or findings which concern him for disease progression.  He also denies having any vision problems as it pertains to his pemigatinib  therapy.    With respect to his cholangiocarcinoma history, the patient did undergo a segmental resection of his liver in April 2019, whose pathology revealed a 4 cm intrahepatic cholangiocarcinoma.  He also had a subcutaneous nodule develop around his umbilicus, which was biopsy-proven to be metastatic cholangiocarcinoma in July 2021.  Due to scans showing other sites of metastatic disease within his peritoneum, he was initially placed on cisplatin /gemcitabine , for which he received 8 cycles.  He continued on single agent gemcitabine  for an additional 8 cycles until CT scans showed disease progression, that was proven per a subcutaneous nodule biopsy in April 2023.  This led to him being placed on pemigatinib  in May 2023, for which he continues to take.    PHYSICAL EXAM:  Blood pressure 121/75, pulse (!) 58, temperature 98.3 F (36.8 C), temperature source Oral, resp. rate 20, height 6' 2 (1.88 m), weight 220 lb (99.8 kg), SpO2 100%. Wt  Readings from Last 3 Encounters:  04/14/24 220 lb (99.8 kg)  01/14/24 208 lb 1.6 oz (94.4 kg)  12/16/23 209 lb (94.8 kg)   Body mass index is 28.25 kg/m. Performance status (ECOG): 1 - Symptomatic but completely ambulatory Physical Exam Constitutional:      Appearance: Normal appearance. He is not ill-appearing.  HENT:     Mouth/Throat:     Mouth: Mucous membranes are moist.     Pharynx: Oropharynx is clear. No oropharyngeal exudate or posterior oropharyngeal erythema.  Cardiovascular:     Rate and Rhythm: Normal rate and regular rhythm.     Heart sounds: No murmur heard.    No friction rub. No gallop.  Pulmonary:     Effort: Pulmonary effort is normal. No respiratory distress.     Breath sounds: Normal breath sounds. No wheezing, rhonchi or rales.  Abdominal:     General: Bowel sounds are normal. There is no distension.     Palpations: Abdomen is soft. There is no mass.     Tenderness: There is no abdominal tenderness.  Musculoskeletal:        General: No swelling.     Right lower leg: No edema.     Left lower leg: No edema.  Lymphadenopathy:     Cervical: No cervical adenopathy.     Upper Body:     Right upper body: No supraclavicular or axillary adenopathy.     Left upper body: No supraclavicular or axillary adenopathy.     Lower Body: No right inguinal adenopathy. No left inguinal adenopathy.  Skin:  General: Skin is warm.     Coloration: Skin is not jaundiced.     Findings: No lesion or rash.  Neurological:     General: No focal deficit present.     Mental Status: He is alert and oriented to person, place, and time. Mental status is at baseline.  Psychiatric:        Mood and Affect: Mood normal.        Behavior: Behavior normal.        Thought Content: Thought content normal.    LABS:      Latest Ref Rng & Units 04/14/2024    1:25 PM 01/14/2024    1:54 PM 11/14/2023    9:33 AM  CBC  WBC 4.0 - 10.5 K/uL 8.5  9.3  8.9   Hemoglobin 13.0 - 17.0 g/dL 87.2   87.2  87.1   Hematocrit 39.0 - 52.0 % 40.1  39.2  40.0   Platelets 150 - 400 K/uL 131  121  143       Latest Ref Rng & Units 04/14/2024    1:25 PM 01/14/2024    1:54 PM 11/18/2023    2:46 PM  CMP  Glucose 70 - 99 mg/dL 94  852  93   BUN 8 - 23 mg/dL 37  46  25   Creatinine 0.61 - 1.24 mg/dL 8.00  7.95  8.50   Sodium 135 - 145 mmol/L 141  142  141   Potassium 3.5 - 5.1 mmol/L 4.6  4.5  4.7   Chloride 98 - 111 mmol/L 104  106  106   CO2 22 - 32 mmol/L 27  25  23    Calcium  8.9 - 10.3 mg/dL 9.7  9.4  9.1   Total Protein 6.5 - 8.1 g/dL 7.4  6.9    Total Bilirubin 0.0 - 1.2 mg/dL 0.5  0.7    Alkaline Phos 38 - 126 U/L 71  67    AST 15 - 41 U/L 27  26    ALT 0 - 44 U/L 16  17     ASSESSMENT & PLAN:  Assessment/Plan:  An 83 y.o. Gary [name] with known metastatic FGFR2 mutation positive cholangiocarcinoma.  Overall, I remain very pleased with how well his pemigatinib  oral therapy has kept his disease under control over the past 2 years.  Clinically, the patient appears to be doing fairly well.  He will continue to take oral pemigatinib  at 9 mg daily, every 2 out of 3 weeks.  I will see him back in 2 months for repeat clinical assessment.  An abdominal MRI will be done before his next visit to ensure there remains no evidence of disease progression.  The patient understands all the plans discussed today and is in agreement with them.    Letonia Stead DELENA Kerns, MD

## 2024-04-27 ENCOUNTER — Other Ambulatory Visit: Payer: Self-pay | Admitting: Oncology

## 2024-06-11 ENCOUNTER — Telehealth: Payer: Self-pay

## 2024-06-11 ENCOUNTER — Inpatient Hospital Stay

## 2024-06-11 ENCOUNTER — Other Ambulatory Visit (HOSPITAL_BASED_OUTPATIENT_CLINIC_OR_DEPARTMENT_OTHER): Admitting: Radiology

## 2024-06-11 NOTE — Telephone Encounter (Signed)
 Pt unable to make it to appt's today (labs, MRI). He states he is too dizzy to drive. He felt fine yesterday. Messages sent to Dr Ezzard, scheduling, registration and lab.

## 2024-06-14 ENCOUNTER — Ambulatory Visit (HOSPITAL_BASED_OUTPATIENT_CLINIC_OR_DEPARTMENT_OTHER)
Admission: RE | Admit: 2024-06-14 | Discharge: 2024-06-14 | Disposition: A | Discharge status: Home / Self Care | Source: Ambulatory Visit | Attending: Oncology | Admitting: Oncology

## 2024-06-14 DIAGNOSIS — C240 Malignant neoplasm of extrahepatic bile duct: Secondary | ICD-10-CM | POA: Diagnosis present

## 2024-06-14 MED ORDER — GADOBUTROL 1 MMOL/ML IV SOLN
9.0000 mL | Freq: Once | INTRAVENOUS | Status: AC | PRN
  Administered 2024-06-14: 9 mL via INTRAVENOUS

## 2024-06-15 ENCOUNTER — Other Ambulatory Visit

## 2024-06-15 NOTE — Progress Notes (Unsigned)
 [facility]  [address] [place],  [place]  [address] [phone]  Clinic Day:  04/14/2024  Referring physician: Pia Kerney SQUIBB, MD   HISTORY OF PRESENT ILLNESS:  The patient is a 83 y.o. 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  Since May 2023, he has been on pemigatinib  for his disease management.  Abdominal MRIs have shown his disease to be under ideal control since his pemigatinib  was started.  He comes in today to review his most recent abdominal MRI.  Since his last visit, the patient has been doing fairly well.  He has occasional balance issues and weakness, which have both been chronic in nature.  With respect to his cholangiocarcinoma, he denies having any new symptoms or findings which concern him for disease progression.  He also denies having any vision problems as it pertains to his pemigatinib  therapy.    With respect to his cholangiocarcinoma history, the patient did undergo a segmental resection of his liver in April 2019, whose pathology revealed a 4 cm intrahepatic cholangiocarcinoma.  He also had a subcutaneous nodule develop around his umbilicus, which was biopsy-proven to be metastatic cholangiocarcinoma in July 2021.  Due to scans showing other sites of metastatic disease within his peritoneum, he was initially placed on cisplatin /gemcitabine , for which he received 8 cycles.  He continued on single agent gemcitabine  for an additional 8 cycles until CT scans showed disease progression, that was proven per a subcutaneous nodule biopsy in April 2023.  This led to him being placed on pemigatinib  in May 2023, for which he continues to take.    PHYSICAL EXAM:  There were no vitals taken for this visit. Wt Readings from Last 3 Encounters:  04/14/24 220 lb (99.8 kg)  01/14/24 208 lb 1.6 oz (94.4 kg)  12/16/23 209 lb (94.8 kg)    There is no height or weight on file to calculate BMI. Performance status (ECOG): 1 - Symptomatic but completely ambulatory Physical Exam Constitutional:      Appearance: Normal appearance. He is not ill-appearing.  HENT:     Mouth/Throat:     Mouth: Mucous membranes are moist.     Pharynx: Oropharynx is clear. No oropharyngeal exudate or posterior oropharyngeal erythema.  Cardiovascular:     Rate and Rhythm: Normal rate and regular rhythm.     Heart sounds: No murmur heard.    No friction rub. No gallop.  Pulmonary:     Effort: Pulmonary effort is normal. No respiratory distress.     Breath sounds: Normal breath sounds. No wheezing, rhonchi or rales.  Abdominal:     General: Bowel sounds are normal. There is no distension.     Palpations: Abdomen is soft. There is no mass.     Tenderness: There is no abdominal tenderness.  Musculoskeletal:        General: No swelling.     Right lower leg: No edema.     Left lower leg: No edema.  Lymphadenopathy:     Cervical: No cervical adenopathy.     Upper Body:     Right upper body: No supraclavicular or axillary adenopathy.     Left upper body: No supraclavicular or axillary adenopathy.     Lower Body: No right inguinal adenopathy. No left inguinal adenopathy.  Skin:    General: Skin is warm.     Coloration: Skin is not jaundiced.  Findings: No lesion or rash.  Neurological:     General: No focal deficit present.     Mental Status: He is alert and oriented to person, place, and time. Mental status is at baseline.  Psychiatric:        Mood and Affect: Mood normal.        Behavior: Behavior normal.        Thought Content: Thought content normal.   SCANS:  The MRI of his abdomen/pelvis revealed the following: FINDINGS: COMBINED FINDINGS FOR BOTH MR ABDOMEN AND PELVIS   Lower chest: No acute findings.  No pleural or pericardial effusion.   Hepatobiliary: Surgical changes from left hepatectomy. No findings for recurrent tumor  or metastatic disease involving the right hepatic lobe. Normal caliber and course of the common bile duct.   Pancreas:  No mass, inflammation or ductal dilatation.   Spleen:  Normal size.  No focal lesions.   Adrenals/Urinary Tract: Adrenal glands and kidneys are unremarkable. Stable small scattered renal cysts. The bladder is unremarkable.   Stomach/Bowel: Marked distension of the stomach with fluid, food and gas. No obvious findings for gastric outlet obstruction. The small bowel and colon are grossly normal.   Vascular/Lymphatic: The aorta and branch vessels are patent. The major venous structures are patent. No abdominal lymphadenopathy.   Reproductive: Surgically absent.   Other: The left pericolic masses are again demonstrated. The larger more superior lesion near the descending colon measures 3.4 x 2.9 cm and the smaller more caudal lesion measures 2.5 x 2.2 cm. These are relatively unchanged. No new or progressive findings.   Musculoskeletal: No significant bony findings.   IMPRESSION: 1. Surgical changes from left hepatectomy. No findings for recurrent tumor or metastatic disease involving the right hepatic lobe. 2. Stable left paracolic masses. 3. No new or progressive findings. ----------------------------------------------------- FINDINGS: Lower chest: Trace right pleural effusion. Peripheral predominant reticular signal abnormalities in the lung bases.   Hepatobiliary: Prior left hepatectomy. No bile duct dilation. Cholecystectomy.   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 No main ductal dilation, mass lesion, or abnormal enhancement.   Spleen:  Within normal limits in size and appearance.   Adrenals/Urinary Tract: No adrenal nodules. No suspicious renal masses identified. No evidence of hydronephrosis.  Multifocal subcentimeter intrinsically T1 hyperintense foci within the left kidney, likely hemorrhagic/proteinaceous cysts. No specific follow-up imaging recommended.   Stomach/Bowel: Visualized portions within the abdomen are unremarkable.   Vascular/Lymphatic: No pathologically enlarged lymph nodes identified. No abdominal aortic aneurysm demonstrated.   Other: Left paracolic peritoneal masses are slightly decreased in size measuring 3.1 cm (3:22), previously 3.5 cm and 2.5 cm (3:18), previously 2.7 cm. Partially imaged inferior midline peritoneal mass measures 6.9 cm (3:26) and is not substantially changed in size.   Musculoskeletal: No suspicious bone lesions identified. Postsurgical changes of the anterior abdominal wall. Previously noted subcutaneous lipomatous lesion in the left hip is not included within the field of view.   IMPRESSION: 1. Slight interval decrease in size of left paracolic peritoneal masses. Partially imaged inferior midline peritoneal mass is not substantially changed in size. 2. No new sites of metastatic disease in the abdomen. 3. Trace right pleural effusion. 4. Peripheral predominant reticular signal abnormalities in the lung bases, which may reflect pulmonary fibrosis. 5. Previously noted subcutaneous lipomatous lesion in the left hip is not included within the field of view.  LABS:  Latest Ref Rng & Units 04/14/2024    1:25 PM 01/14/2024    1:54 PM 11/14/2023    9:33 AM  CBC  WBC 4.0 - 10.5 K/uL 8.5  9.3  8.9   Hemoglobin 13.0 - 17.0 g/dL 87.2  87.2  87.1   Hematocrit 39.0 - 52.0 % 40.1  39.2  40.0   Platelets 150 - 400 K/uL 131  121  143       Latest Ref Rng & Units 04/14/2024    1:25 PM 01/14/2024    1:54 PM 11/18/2023    2:46 PM  CMP  Glucose 70 - 99 mg/dL 94  852  93   BUN 8 - 23 mg/dL 37  46  25   Creatinine 0.61 - 1.24 mg/dL 8.00  7.95  8.50   Sodium 135 - 145 mmol/L 141  142  141   Potassium 3.5 - 5.1 mmol/L 4.6  4.5  4.7    Chloride 98 - 111 mmol/L 104  106  106   CO2 22 - 32 mmol/L 27  25  23    Calcium  8.9 - 10.3 mg/dL 9.7  9.4  9.1   Total Protein 6.5 - 8.1 g/dL 7.4  6.9    Total Bilirubin 0.0 - 1.2 mg/dL 0.5  0.7    Alkaline Phos 38 - 126 U/L 71  67    AST 15 - 41 U/L 27  26    ALT 0 - 44 U/L 16  17     ASSESSMENT & PLAN:  Assessment/Plan:  An 83 y.o. male with known metastatic FGFR2 mutation positive cholangiocarcinoma.  Overall, I remain very pleased with how well his pemigatinib  oral therapy has kept his disease under control over the past 2 years.  Clinically, the patient appears to be doing fairly well.  He will continue to take oral pemigatinib  at 9 mg daily, every 2 out of 3 weeks.  I will see him back in 2 months for repeat clinical assessment.  An abdominal MRI will be done before his next visit to ensure there remains no evidence of disease progression.  The patient understands all the plans discussed today and is in agreement with them.    Khadar Monger DELENA Kerns, MD

## 2024-06-16 ENCOUNTER — Inpatient Hospital Stay: Attending: Oncology | Admitting: Oncology

## 2024-06-16 ENCOUNTER — Inpatient Hospital Stay

## 2024-06-16 ENCOUNTER — Telehealth: Payer: Self-pay | Admitting: Oncology

## 2024-06-16 ENCOUNTER — Other Ambulatory Visit: Payer: Self-pay | Admitting: Oncology

## 2024-06-16 VITALS — BP 110/63 | HR 94 | Temp 97.8°F | Resp 18 | Ht 74.0 in | Wt 232.5 lb

## 2024-06-16 DIAGNOSIS — C240 Malignant neoplasm of extrahepatic bile duct: Secondary | ICD-10-CM | POA: Diagnosis not present

## 2024-06-16 DIAGNOSIS — Z79899 Other long term (current) drug therapy: Secondary | ICD-10-CM | POA: Insufficient documentation

## 2024-06-16 DIAGNOSIS — C786 Secondary malignant neoplasm of retroperitoneum and peritoneum: Secondary | ICD-10-CM | POA: Diagnosis not present

## 2024-06-16 DIAGNOSIS — C221 Intrahepatic bile duct carcinoma: Secondary | ICD-10-CM | POA: Diagnosis present

## 2024-06-16 LAB — CBC WITH DIFFERENTIAL (CANCER CENTER ONLY)
Abs Immature Granulocytes: 0.06 K/uL (ref 0.00–0.07)
Basophils Absolute: 0.1 K/uL (ref 0.0–0.1)
Basophils Relative: 1 %
Eosinophils Absolute: 0.2 K/uL (ref 0.0–0.5)
Eosinophils Relative: 2 %
HCT: 38.4 % — ABNORMAL LOW (ref 39.0–52.0)
Hemoglobin: 12.1 g/dL — ABNORMAL LOW (ref 13.0–17.0)
Immature Granulocytes: 1 %
Lymphocytes Relative: 22 %
Lymphs Abs: 1.8 K/uL (ref 0.7–4.0)
MCH: 30.3 pg (ref 26.0–34.0)
MCHC: 31.5 g/dL (ref 30.0–36.0)
MCV: 96.2 fL (ref 80.0–100.0)
Monocytes Absolute: 0.9 K/uL (ref 0.1–1.0)
Monocytes Relative: 11 %
Neutro Abs: 5.3 K/uL (ref 1.7–7.7)
Neutrophils Relative %: 63 %
Platelet Count: 144 K/uL — ABNORMAL LOW (ref 150–400)
RBC: 3.99 MIL/uL — ABNORMAL LOW (ref 4.22–5.81)
RDW: 13 % (ref 11.5–15.5)
WBC Count: 8.2 K/uL (ref 4.0–10.5)
nRBC: 0 % (ref 0.0–0.2)

## 2024-06-16 LAB — CMP (CANCER CENTER ONLY)
ALT: 18 U/L (ref 0–44)
AST: 31 U/L (ref 15–41)
Albumin: 3.9 g/dL (ref 3.5–5.0)
Alkaline Phosphatase: 69 U/L (ref 38–126)
Anion gap: 10 (ref 5–15)
BUN: 37 mg/dL — ABNORMAL HIGH (ref 8–23)
CO2: 27 mmol/L (ref 22–32)
Calcium: 9.6 mg/dL (ref 8.9–10.3)
Chloride: 104 mmol/L (ref 98–111)
Creatinine: 2.38 mg/dL — ABNORMAL HIGH (ref 0.61–1.24)
GFR, Estimated: 27 mL/min — ABNORMAL LOW (ref >60)
Glucose, Bld: 87 mg/dL (ref 70–99)
Potassium: 5 mmol/L (ref 3.5–5.1)
Sodium: 142 mmol/L (ref 135–145)
Total Bilirubin: 0.5 mg/dL (ref 0.0–1.2)
Total Protein: 7.4 g/dL (ref 6.5–8.1)

## 2024-06-16 NOTE — Telephone Encounter (Signed)
 Patient has been scheduled for follow-up visit per 06/16/24 LOS.  Pt given an appt calendar with date and time.

## 2024-06-18 ENCOUNTER — Encounter: Payer: Self-pay | Admitting: Cardiology

## 2024-06-18 ENCOUNTER — Ambulatory Visit: Attending: Cardiology | Admitting: Cardiology

## 2024-06-18 VITALS — BP 100/70 | HR 73 | Ht 74.0 in | Wt 230.0 lb

## 2024-06-18 DIAGNOSIS — R0609 Other forms of dyspnea: Secondary | ICD-10-CM | POA: Insufficient documentation

## 2024-06-18 DIAGNOSIS — I255 Ischemic cardiomyopathy: Secondary | ICD-10-CM | POA: Insufficient documentation

## 2024-06-18 DIAGNOSIS — I5022 Chronic systolic (congestive) heart failure: Secondary | ICD-10-CM | POA: Insufficient documentation

## 2024-06-18 DIAGNOSIS — C221 Intrahepatic bile duct carcinoma: Secondary | ICD-10-CM | POA: Insufficient documentation

## 2024-06-18 DIAGNOSIS — I10 Essential (primary) hypertension: Secondary | ICD-10-CM | POA: Diagnosis present

## 2024-06-18 DIAGNOSIS — I2510 Atherosclerotic heart disease of native coronary artery without angina pectoris: Secondary | ICD-10-CM | POA: Insufficient documentation

## 2024-06-18 NOTE — Patient Instructions (Signed)

## 2024-06-18 NOTE — Progress Notes (Signed)
 Cardiology Office Note:    Date:  06/18/2024   ID:  Gary [name], DOB 07/06/1941, MRN [id number]  PCP:  Pia Kerney SQUIBB, MD  Cardiologist:  Lamar Fitch, MD    Referring MD: Pia Kerney SQUIBB, MD   Chief Complaint  Patient presents with   Follow-up  Doing fine  History of Present Illness:    Gary [name] is a 83 y.o. male past medical history significant for coronary artery disease in July 11, 2020 he did have a cardiac catheterization face of acute myocardial infarction, he was found to have multiple lesions however the most culprit lesion appears to be midportion of the circumflex artery that was addressed with drug-eluting stent at that time he was put on dual antiplatelet therapy and discharged home at that time ejection fraction 40 to 45%.  Additional problem include liver cancer aggressively treated with chemotherapy by oncology team with good results.  Recently also was found to have deterioration of his left ventricular ejection fraction, we tried to put him on guideline directed directed medical therapy with some difficulties..  He said majority of time he is fine but days that he cannot do much he is simply too weak to try it too exhausted.  No shortness of breath no swelling of lower extremities, no paroxysmal dyspnea  Past Medical History:  Diagnosis Date   AKI (acute kidney injury) 07/09/2020   Allergic rhinitis 12/13/2020   Anxiety    Arthritis    Benign prostatic hyperplasia with lower urinary tract symptoms 12/13/2020   Bile duct cancer (HCC)    Cardiomegaly 07/29/2020   Cardiomyopathy (HCC) 04/24/2019   CHF (congestive heart failure) (HCC)    Cholangiocarcinoma of liver (HCC) 12/29/2017   Chronic obstructive pulmonary disease (HCC) 12/13/2020   Chronic pain syndrome 12/13/2020   Chronic systolic heart failure (HCC) 12/13/2020   Coronary artery disease with PTCA and stenting of mid and distal circumflex artery on 07/11/2020 07/29/2020   Diastolic  congestive heart failure (HCC) 04/24/2019   Dizziness 08/30/2020   Dyspnea on exertion 04/24/2019   Encounter for therapeutic drug level monitoring 06/15/2020   Essential hypertension 04/24/2019   Gastro-esophageal reflux disease without esophagitis 12/13/2020   Generalized anxiety disorder 12/13/2020   Hyperglycemia due to type 2 diabetes mellitus (HCC) 12/13/2020   Hypertension    Hypothyroidism 12/13/2020   Liver tumor 12/27/2017   Formatting of this note might be different from the original. 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   Mixed hyperlipidemia 12/13/2020   NSTEMI (non-ST elevated myocardial infarction) (HCC) 07/09/2020   Orthostatic hypotension 10/20/2020   Other long term (current) drug therapy 12/13/2020   Other vitamin B12 deficiency anemias 12/13/2020   Pain in left hip 12/13/2020   Preop cardiovascular exam 06/15/2020   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/30/2020   Type 2 diabetes mellitus without complications (HCC) 12/13/2020   Unsteadiness on feet 08/30/2020   UTI (urinary tract infection) 01/08/2018   Vitamin D  deficiency 12/13/2020    Past Surgical History:  Procedure Laterality Date   BILE DUCT EXPLORATION     To remove cancer   CORONARY STENT INTERVENTION N/A 07/11/2020   Procedure: CORONARY STENT INTERVENTION;  Surgeon: Anner Alm ORN, MD;  Location: [facility] INVASIVE CV LAB;  Service: Cardiovascular;  Laterality: N/A;   LEFT HEART CATH  AND CORONARY ANGIOGRAPHY N/A 07/11/2020   Procedure: LEFT HEART CATH AND CORONARY ANGIOGRAPHY;  Surgeon: Anner Alm ORN, MD;  Location: [facility] INVASIVE CV LAB;  Service: Cardiovascular;  Laterality: N/A;   MOHS SURGERY     Basil cell on nose    Current Medications: Current Meds  Medication Sig   ALPRAZolam  (XANAX )  0.5 MG tablet Take 0.5 mg by mouth 3 (three) times daily as needed for anxiety.    aspirin  EC 81 MG tablet Take 1 tablet (81 mg total) by mouth daily. Swallow whole.   atorvastatin  (LIPITOR) 10 MG tablet Take 10 mg by mouth daily.   buPROPion  (WELLBUTRIN  XL) 300 MG 24 hr tablet Take 1 tablet by mouth daily. For 90 days   calcium  carbonate (TUMS - DOSED IN MG ELEMENTAL CALCIUM ) 500 MG chewable tablet Chew 1 tablet by mouth in the morning and at bedtime. Total of 1200 mg daily   clopidogrel (PLAVIX) 75 MG tablet Take 75 mg by mouth daily.   empagliflozin  (JARDIANCE ) 10 MG TABS tablet Take 1 tablet (10 mg total) by mouth daily.   ferrous sulfate 325 (65 FE) MG tablet Take 325 mg by mouth daily with breakfast.   folic acid  (FOLVITE ) 1 MG tablet Take 1 tablet (1 mg total) by mouth daily.   furosemide  (LASIX ) 20 MG tablet Take 1 tablet (20 mg total) by mouth daily.   HYDROcodone -acetaminophen  (NORCO) 10-325 MG tablet Take 1 tablet by mouth every 6 (six) hours as needed for moderate pain or severe pain.   ketoconazole (NIZORAL) 2 % cream Apply 1 Application topically daily.   metoprolol  succinate (TOPROL  XL) 25 MG 24 hr tablet Take 0.5 tablets (12.5 mg total) by mouth every evening.   montelukast (SINGULAIR) 10 MG tablet Take 1 tablet by mouth daily.   Multiple Vitamin (MULTIVITAMIN) tablet Take 1 tablet by mouth daily. Unknown Strength per patient   nitroGLYCERIN  (NITROSTAT ) 0.4 MG SL tablet Place 1 tablet (0.4 mg total) under the tongue every 5 (five) minutes as needed for chest pain.   Omega-3 Fatty Acids (FISH OIL) 1200 MG CPDR Take 1 tablet by mouth daily.   omeprazole (PRILOSEC) 40 MG capsule Take 1 capsule by mouth daily as needed (Acid reflux).   ondansetron  (ZOFRAN ) 4 MG tablet TAKE 1 TABLET BY MOUTH EVERY 4 HOURS AS NEEDED FOR NAUSEA OR VOMITING   pemigatinib  (PEMAZYRE ) 9 MG tablet Take 1 tablet (9 mg total) by mouth daily. Take at approximately the same time each day. Take for 14 days on, 7  days off, repeat every 21d.   Plecanatide (TRULANCE) 3 MG TABS Take 3 mg by mouth 2 (two) times a week.   tamsulosin  (FLOMAX ) 0.4 MG CAPS capsule Take 0.4 mg by mouth daily.   vitamin B-12 (CYANOCOBALAMIN) 250 MCG tablet Take 250 mcg by mouth daily.   Vitamin D , Ergocalciferol , 50 MCG (2000 UT) CAPS Take 1 tablet by mouth daily.     Allergies:   Heparin , Pork-derived products, and Amoxicillin   Social History   Socioeconomic History   Marital status: Widowed    Spouse name: Not on file   Number of children: Not on file   Years of education: Not on file   Highest education level: Not on file  Occupational History   Not on file  Tobacco Use   Smoking status: Never   Smokeless tobacco: Current    Types: Chew  Substance and Sexual Activity   Alcohol use: Not Currently   Drug use: Never  Sexual activity: Not on file  Other Topics Concern   Not on file  Social History Narrative   Not on file   Social Drivers of Health   Financial Resource Strain: Not on file  Food Insecurity: Not on file  Transportation Needs: Not on file  Physical Activity: Not on file  Stress: Not on file  Social Connections: Not on file     Family History: The patient's family history includes Breast cancer in his sister and sister; Cancer in his father. ROS:   Please see the history of present illness.    All 14 point review of systems negative except as described per history of present illness  EKGs/Labs/Other Studies Reviewed:    EKG Interpretation Date/Time:  Thursday June 18 2024 13:10:49 EDT Ventricular Rate:  73 PR Interval:    QRS Duration:  90 QT Interval:  360 QTC Calculation: 396 R Axis:   3  Text Interpretation: Atrial fibrillation with premature ventricular or aberrantly conducted complexes Moderate voltage criteria for LVH, may be normal variant ST & T wave abnormality, consider inferior ischemia ST & T wave abnormality, consider anterolateral ischemia Abnormal ECG When  compared with ECG of 12-Jul-2020 06:07, Atrial fibrillation has replaced Sinus rhythm T wave inversion now evident in Inferior leads Inverted T waves have replaced nonspecific T wave abnormality in Anterolateral leads Confirmed by Bernie Charleston [phone]) on 06/18/2024 1:22:35 PM    Recent Labs: 11/18/2023: NT-Pro BNP 848 06/16/2024: ALT 18; BUN 37; Creatinine 2.38; Hemoglobin 12.1; Platelet Count 144; Potassium 5.0; Sodium 142  Recent Lipid Panel    Component Value Date/Time   CHOL 139 07/09/2020 0354   TRIG 255 (H) 07/09/2020 0354   HDL 33 (L) 07/09/2020 0354   CHOLHDL 4.2 07/09/2020 0354   VLDL 51 (H) 07/09/2020 0354   LDLCALC 55 07/09/2020 0354    Physical Exam:    VS:  BP 100/70   Pulse 73   Ht 6' 2 (1.88 m)   Wt 230 lb (104.3 kg)   SpO2 96%   BMI 29.53 kg/m     Wt Readings from Last 3 Encounters:  06/18/24 230 lb (104.3 kg)  06/16/24 232 lb 8 oz (105.5 kg)  04/14/24 220 lb (99.8 kg)     GEN:  Well nourished, well developed in no acute distress HEENT: Normal NECK: No JVD; No carotid bruits LYMPHATICS: No lymphadenopathy CARDIAC: RRR, no murmurs, no rubs, no gallops RESPIRATORY:  Clear to auscultation without rales, wheezing or rhonchi  ABDOMEN: Soft, non-tender, non-distended MUSCULOSKELETAL:  No edema; No deformity  SKIN: Warm and dry LOWER EXTREMITIES: no swelling NEUROLOGIC:  Alert and oriented x 3 PSYCHIATRIC:  Normal affect   ASSESSMENT:    1. Coronary artery disease involving native coronary artery of native heart without angina pectoris   2. Primary hypertension   3. Ischemic cardiomyopathy   4. Chronic systolic heart failure (HCC)   5. Essential hypertension   6. Cholangiocarcinoma of liver (HCC)    PLAN:    In order of problems listed above:  Coronary disease stable from that point review no signs and symptoms of reactivation of the problem, will continue present management. Essential hypertension: Blood pressure on the lower side is usually.   Continue present medications. Cardiomyopathy with moderately reduced left ventricular ejection fraction, will repeat echocardiogram to recheck left ventricular ejection fraction. Cholangiocarcinoma of the liver follow-up excellently like always by our oncology team   Medication Adjustments/Labs and Tests Ordered: Current medicines are reviewed at length with the  patient today.  Concerns regarding medicines are outlined above.  Orders Placed This Encounter  Procedures   EKG 12-Lead   Medication changes: No orders of the defined types were placed in this encounter.   Signed, Lamar DOROTHA Fitch, MD, [facility] 06/18/2024 1:24 PM     [facility]

## 2024-06-18 NOTE — Addendum Note (Signed)
 Addended by: ARLOA PLANAS D on: 06/18/2024 01:30 PM   Modules accepted: Orders

## 2024-06-21 ENCOUNTER — Other Ambulatory Visit (HOSPITAL_BASED_OUTPATIENT_CLINIC_OR_DEPARTMENT_OTHER)

## 2024-07-09 ENCOUNTER — Ambulatory Visit

## 2024-07-28 ENCOUNTER — Ambulatory Visit: Attending: Cardiology

## 2024-07-28 DIAGNOSIS — R0609 Other forms of dyspnea: Secondary | ICD-10-CM | POA: Insufficient documentation

## 2024-07-28 LAB — ECHOCARDIOGRAM COMPLETE
Area-P 1/2: 2.77 cm2
S' Lateral: 3.7 cm

## 2024-08-03 ENCOUNTER — Ambulatory Visit: Payer: Self-pay | Admitting: Cardiology

## 2024-08-18 NOTE — Telephone Encounter (Signed)
-----   Message from Lamar Fitch sent at 08/03/2024 11:12 AM EST ----- Echocardiogram showed low normal ejection fraction but overall everything else looks fine ----- Message ----- From: Interface, [employer] Sent: 07/28/2024   7:24 PM EST To: Lamar JINNY Fitch, MD

## 2024-08-18 NOTE — Telephone Encounter (Signed)
 Patient is returning call.

## 2024-08-18 NOTE — Telephone Encounter (Signed)
 Left vm to return call.

## 2024-09-08 ENCOUNTER — Telehealth: Payer: Self-pay

## 2024-09-08 ENCOUNTER — Telehealth: Payer: Self-pay | Admitting: Cardiology

## 2024-09-08 NOTE — Telephone Encounter (Signed)
 Spoke with pts son who wanted to know if patient could have back surgery. Advised that once he sees a careers adviser the surgeon will send a surgical clearance form to us  and we will see him for an appt and he will discuss with the doctor if any testing needs to be done to clear him for the surgery needed. Her verbalized understanding and had no further questions.

## 2024-09-08 NOTE — Telephone Encounter (Signed)
 Pt's son states his dad was in hospital from Thursday to Sunday with severe UTI, dehydration and sever back and right leg pain. They had to do 2 MRI's because he couldn't sit still well due to pain. MRI states indenting thecal sac & moderate recess & spinal canal narrowing @ L3 and L4. Per reading hospital notes, it has been recommended he see a neurosurgeon for surgery asap. Per discharge notes, it mentions pt not sure he wants to have surgery. Son states they told him to f/u with his PCP, heart doctor and oncologist to see if he would even be eligible for surgery. He has been falling pretty often @ home. Son states he is struggling to manage his dad at home. He lives with his dad, but Ryin is alone while he works. Son confirms no one has set him up with a neurosurgeon. He has called his PCP this am. Message sent to Dr Ezzard, and [facility].

## 2024-09-08 NOTE — Telephone Encounter (Signed)
 Pt's son Hezzie) called requesting to speak with Dr Bernie nurse due to hospital stay his father recently had. He stated his father has to have surgery soon and wanted to ask some questions. Please advise

## 2024-09-15 ENCOUNTER — Inpatient Hospital Stay

## 2024-09-15 ENCOUNTER — Telehealth: Payer: Self-pay

## 2024-09-15 ENCOUNTER — Inpatient Hospital Stay: Attending: Oncology | Admitting: Oncology

## 2024-09-15 ENCOUNTER — Other Ambulatory Visit: Payer: Self-pay

## 2024-09-15 NOTE — Telephone Encounter (Signed)
 Pt is unable to come in today for appt's. He has 2 crushed vertebra & can't walk. He will be having at least 2 back surgeries he stated. One surgery is exploratory as he has been unable to sit still with MRI, even with IV pain med. He is interested in phone visit. He was IP recently @ [facility] - severe UTI, right leg pain, back pain - discharge summary w/labs listed in media section. He is still taking Pemazyre  & tolerating without problems.  He has been placed on oxycodone 15mg , skelaxin, prednisone, gabapentin and steroid for pain. Message sent to Dr Ezzard, Eleanor P,NP, and [facility].

## 2024-09-15 NOTE — Progress Notes (Unsigned)
 " [facility] at [facility] [address] [place],  [place]  [address] [phone]  Clinic Day:  06/16/2024  Referring physician: Pia Kerney SQUIBB, MD   HISTORY OF PRESENT ILLNESS:  The patient is a 83 y.o. 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  Since May 2023, he has been on pemigatinib  for his disease management.  Abdominal MRIs have shown his disease to be under ideal control since his pemigatinib  was started.  He comes in today to review his most recent abdominal MRI.  Since his last visit, the patient has been doing well.   With respect to his cholangiocarcinoma, he denies having any new symptoms or findings which concern him for disease progression.  He also denies having any vision problems as it pertains to his pemigatinib  therapy.    With respect to his cholangiocarcinoma history, the patient did undergo a segmental resection of his liver in April 2019, whose pathology revealed a 4 cm intrahepatic cholangiocarcinoma.  He also had a subcutaneous nodule develop around his umbilicus, which was biopsy-proven to be metastatic cholangiocarcinoma in July 2021.  Due to scans showing other sites of metastatic disease within his peritoneum, he was initially placed on cisplatin /gemcitabine , for which he received 8 cycles.  He continued on single agent gemcitabine  for an additional 8 cycles until CT scans showed disease progression, that was proven per a subcutaneous nodule biopsy in April 2023.  This led to him being placed on pemigatinib  in May 2023, for which he continues to take.    PHYSICAL EXAM:  There were no vitals taken for this visit. Wt Readings from Last 3 Encounters:  06/18/24 230 lb (104.3 kg)  06/16/24 232 lb 8 oz (105.5 kg)  04/14/24 220 lb (99.8 kg)   There is no height or weight on file to calculate BMI. Performance status (ECOG): 1 -  Symptomatic but completely ambulatory Physical Exam Constitutional:      Appearance: Normal appearance. He is not ill-appearing.  HENT:     Mouth/Throat:     Mouth: Mucous membranes are moist.     Pharynx: Oropharynx is clear. No oropharyngeal exudate or posterior oropharyngeal erythema.  Cardiovascular:     Rate and Rhythm: Normal rate and regular rhythm.     Heart sounds: No murmur heard.    No friction rub. No gallop.  Pulmonary:     Effort: Pulmonary effort is normal. No respiratory distress.     Breath sounds: Normal breath sounds. No wheezing, rhonchi or rales.  Abdominal:     General: Bowel sounds are normal. There is no distension.     Palpations: Abdomen is soft. There is no mass.     Tenderness: There is no abdominal tenderness.  Musculoskeletal:        General: No swelling.     Right lower leg: No edema.     Left lower leg: No edema.  Lymphadenopathy:     Cervical: No cervical adenopathy.     Upper Body:     Right upper body: No supraclavicular or axillary adenopathy.     Left upper body: No supraclavicular or axillary adenopathy.     Lower Body: No right inguinal adenopathy. No left inguinal adenopathy.  Skin:    General: Skin is warm.     Coloration: Skin is not jaundiced.     Findings: No lesion or rash.  Neurological:     General: No  focal deficit present.     Mental Status: He is alert and oriented to person, place, and time. Mental status is at baseline.  Psychiatric:        Mood and Affect: Mood normal.        Behavior: Behavior normal.        Thought Content: Thought content normal.   SCANS:  The MRI of his abdomen/pelvis on 06/14/2024 revealed the following: FINDINGS: COMBINED FINDINGS FOR BOTH MR ABDOMEN AND PELVIS   Lower chest: No acute findings.  No pleural or pericardial effusion.   Hepatobiliary: Surgical changes from left hepatectomy. No findings for recurrent tumor or metastatic disease involving the right hepatic lobe. Normal caliber and  course of the common bile duct.   Pancreas:  No mass, inflammation or ductal dilatation.   Spleen:  Normal size.  No focal lesions.   Adrenals/Urinary Tract: Adrenal glands and kidneys are unremarkable. Stable small scattered renal cysts. The bladder is unremarkable.   Stomach/Bowel: Marked distension of the stomach with fluid, food and gas. No obvious findings for gastric outlet obstruction. The small bowel and colon are grossly normal.   Vascular/Lymphatic: The aorta and branch vessels are patent. The major venous structures are patent. No abdominal lymphadenopathy.   Reproductive: Surgically absent.   Other: The left pericolic masses are again demonstrated. The larger more superior lesion near the descending colon measures 3.4 x 2.9 cm and the smaller more caudal lesion measures 2.5 x 2.2 cm. These are relatively unchanged. No new or progressive findings.   Musculoskeletal: No significant bony findings.   IMPRESSION: 1. Surgical changes from left hepatectomy. No findings for recurrent tumor or metastatic disease involving the right hepatic lobe. 2. Stable left paracolic masses. 3. No new or progressive findings. ----------------------------------------------------- FINDINGS: Lower chest: Trace right pleural effusion. Peripheral predominant reticular signal abnormalities in the lung bases.   Hepatobiliary: Prior left hepatectomy. No bile duct dilation. Cholecystectomy.   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 No main ductal dilation, mass lesion, or abnormal enhancement.   Spleen:  Within normal limits in size and appearance.   Adrenals/Urinary Tract: No adrenal nodules. No suspicious renal masses identified. No evidence of hydronephrosis. Multifocal subcentimeter intrinsically T1 hyperintense foci within the  left kidney, likely hemorrhagic/proteinaceous cysts. No specific follow-up imaging recommended.   Stomach/Bowel: Visualized portions within the abdomen are unremarkable.   Vascular/Lymphatic: No pathologically enlarged lymph nodes identified. No abdominal aortic aneurysm demonstrated.   Other: Left paracolic peritoneal masses are slightly decreased in size measuring 3.1 cm (3:22), previously 3.5 cm and 2.5 cm (3:18), previously 2.7 cm. Partially imaged inferior midline peritoneal mass measures 6.9 cm (3:26) and is not substantially changed in size.   Musculoskeletal: No suspicious bone lesions identified. Postsurgical changes of the anterior abdominal wall. Previously noted subcutaneous lipomatous lesion in the left hip is not included within the field of view.   IMPRESSION: 1. Slight interval decrease in size of left paracolic peritoneal masses. Partially imaged inferior midline peritoneal mass is not substantially changed in size. 2. No new sites of metastatic disease in the abdomen. 3. Trace right pleural effusion. 4. Peripheral predominant reticular signal abnormalities in the lung bases, which may reflect pulmonary fibrosis. 5. Previously noted subcutaneous lipomatous lesion in the left hip is not included within the field of view.  LABS:      Latest Ref Rng & Units 06/16/2024  1:00 PM 04/14/2024    1:25 PM 01/14/2024    1:54 PM  CBC  WBC 4.0 - 10.5 K/uL 8.2  8.5  9.3   Hemoglobin 13.0 - 17.0 g/dL 87.8  87.2  87.2   Hematocrit 39.0 - 52.0 % 38.4  40.1  39.2   Platelets 150 - 400 K/uL 144  131  121       Latest Ref Rng & Units 06/16/2024    1:00 PM 04/14/2024    1:25 PM 01/14/2024    1:54 PM  CMP  Glucose 70 - 99 mg/dL 87  94  852   BUN 8 - 23 mg/dL 37  37  46   Creatinine 0.61 - 1.24 mg/dL 7.61  8.00  7.95   Sodium 135 - 145 mmol/L 142  141  142   Potassium 3.5 - 5.1 mmol/L 5.0  4.6  4.5   Chloride 98 - 111 mmol/L 104  104  106   CO2 22 - 32 mmol/L 27  27  25     Calcium  8.9 - 10.3 mg/dL 9.6  9.7  9.4   Total Protein 6.5 - 8.1 g/dL 7.4  7.4  6.9   Total Bilirubin 0.0 - 1.2 mg/dL 0.5  0.5  0.7   Alkaline Phos 38 - 126 U/L 69  71  67   AST 15 - 41 U/L 31  27  26    ALT 0 - 44 U/L 18  16  17     ASSESSMENT & PLAN:  Assessment/Plan:  An 83 y.o. male with known metastatic FGFR2 mutation positive cholangiocarcinoma.  In clinic today, I went over his MRI images with him, for which he could see that his disease remains under ideal control.  There continues to be no evidence of disease progression.  Overall, I remain very pleased with how well his pemigatinib  oral therapy has kept his disease under control over the past 2+ years years.  Clinically, the patient appears to be doing fairly well.  He will continue to take oral pemigatinib  at 9 mg daily, every 2 out of 3 weeks.  I will see him back in 3 months for repeat clinical assessment.  The patient understands all the plans discussed today and is in agreement with them.    Beatriz Settles DELENA Kerns, MD       "

## 2024-12-15 ENCOUNTER — Ambulatory Visit: Admitting: Cardiology
